# Patient Record
Sex: Female | Born: 1945 | Race: White | Hispanic: No | Smoking: Never smoker
Health system: Southern US, Community
[De-identification: ages and names within clinical notes are randomized; demographics above are authoritative.]

## PROBLEM LIST (undated history)

## (undated) DIAGNOSIS — G629 Polyneuropathy, unspecified: Secondary | ICD-10-CM

## (undated) DIAGNOSIS — R011 Cardiac murmur, unspecified: Secondary | ICD-10-CM

## (undated) DIAGNOSIS — E11610 Type 2 diabetes mellitus with diabetic neuropathic arthropathy: Secondary | ICD-10-CM

## (undated) DIAGNOSIS — G709 Myoneural disorder, unspecified: Secondary | ICD-10-CM

## (undated) DIAGNOSIS — L689 Hypertrichosis, unspecified: Secondary | ICD-10-CM

## (undated) DIAGNOSIS — D649 Anemia, unspecified: Secondary | ICD-10-CM

## (undated) DIAGNOSIS — E785 Hyperlipidemia, unspecified: Secondary | ICD-10-CM

## (undated) DIAGNOSIS — F419 Anxiety disorder, unspecified: Secondary | ICD-10-CM

## (undated) DIAGNOSIS — E119 Type 2 diabetes mellitus without complications: Secondary | ICD-10-CM

## (undated) DIAGNOSIS — Z87442 Personal history of urinary calculi: Secondary | ICD-10-CM

## (undated) DIAGNOSIS — I10 Essential (primary) hypertension: Secondary | ICD-10-CM

## (undated) DIAGNOSIS — Z780 Asymptomatic menopausal state: Principal | ICD-10-CM

## (undated) DIAGNOSIS — M1990 Unspecified osteoarthritis, unspecified site: Secondary | ICD-10-CM

## (undated) HISTORY — PX: CARPAL TUNNEL RELEASE: SHX101

## (undated) HISTORY — DX: Hypertrichosis, unspecified: L68.9

## (undated) HISTORY — DX: Type 2 diabetes mellitus with diabetic neuropathic arthropathy: E11.610

## (undated) HISTORY — PX: APPENDECTOMY: SHX54

## (undated) HISTORY — DX: Asymptomatic menopausal state: Z78.0

---

## 1999-04-09 HISTORY — PX: CARPAL TUNNEL RELEASE: SHX101

## 2000-12-31 ENCOUNTER — Ambulatory Visit (HOSPITAL_COMMUNITY): Admission: RE | Admit: 2000-12-31 | Discharge: 2000-12-31 | Payer: Self-pay | Admitting: Family Medicine

## 2000-12-31 ENCOUNTER — Encounter: Payer: Self-pay | Admitting: Family Medicine

## 2001-06-23 ENCOUNTER — Encounter: Payer: Self-pay | Admitting: *Deleted

## 2001-06-23 ENCOUNTER — Emergency Department (HOSPITAL_COMMUNITY): Admission: EM | Admit: 2001-06-23 | Discharge: 2001-06-23 | Payer: Self-pay | Admitting: *Deleted

## 2001-12-31 ENCOUNTER — Ambulatory Visit (HOSPITAL_COMMUNITY): Admission: RE | Admit: 2001-12-31 | Discharge: 2001-12-31 | Payer: Self-pay | Admitting: Family Medicine

## 2001-12-31 ENCOUNTER — Encounter: Payer: Self-pay | Admitting: Family Medicine

## 2003-01-05 ENCOUNTER — Encounter: Payer: Self-pay | Admitting: Family Medicine

## 2003-01-05 ENCOUNTER — Ambulatory Visit (HOSPITAL_COMMUNITY): Admission: RE | Admit: 2003-01-05 | Discharge: 2003-01-05 | Payer: Self-pay | Admitting: Family Medicine

## 2004-01-05 ENCOUNTER — Ambulatory Visit (HOSPITAL_COMMUNITY): Admission: RE | Admit: 2004-01-05 | Discharge: 2004-01-05 | Payer: Self-pay | Admitting: Family Medicine

## 2005-01-07 ENCOUNTER — Ambulatory Visit (HOSPITAL_COMMUNITY): Admission: RE | Admit: 2005-01-07 | Discharge: 2005-01-07 | Payer: Self-pay | Admitting: Family Medicine

## 2005-01-21 ENCOUNTER — Ambulatory Visit: Payer: Self-pay | Admitting: Internal Medicine

## 2005-01-21 ENCOUNTER — Ambulatory Visit (HOSPITAL_COMMUNITY): Admission: RE | Admit: 2005-01-21 | Discharge: 2005-01-21 | Payer: Self-pay | Admitting: Internal Medicine

## 2006-01-09 ENCOUNTER — Ambulatory Visit (HOSPITAL_COMMUNITY): Admission: RE | Admit: 2006-01-09 | Discharge: 2006-01-09 | Payer: Self-pay | Admitting: Family Medicine

## 2007-01-12 ENCOUNTER — Ambulatory Visit (HOSPITAL_COMMUNITY): Admission: RE | Admit: 2007-01-12 | Discharge: 2007-01-12 | Payer: Self-pay | Admitting: Family Medicine

## 2007-01-21 ENCOUNTER — Ambulatory Visit (HOSPITAL_COMMUNITY): Admission: RE | Admit: 2007-01-21 | Discharge: 2007-01-21 | Payer: Self-pay | Admitting: Family Medicine

## 2007-10-24 ENCOUNTER — Emergency Department (HOSPITAL_COMMUNITY): Admission: EM | Admit: 2007-10-24 | Discharge: 2007-10-24 | Payer: Self-pay | Admitting: Family Medicine

## 2008-01-14 ENCOUNTER — Ambulatory Visit (HOSPITAL_COMMUNITY): Admission: RE | Admit: 2008-01-14 | Discharge: 2008-01-14 | Payer: Self-pay | Admitting: Family Medicine

## 2008-01-27 ENCOUNTER — Ambulatory Visit: Payer: Self-pay | Admitting: Cardiology

## 2008-01-27 ENCOUNTER — Ambulatory Visit (HOSPITAL_COMMUNITY): Admission: RE | Admit: 2008-01-27 | Discharge: 2008-01-27 | Payer: Self-pay | Admitting: Endocrinology

## 2008-01-27 ENCOUNTER — Encounter (INDEPENDENT_AMBULATORY_CARE_PROVIDER_SITE_OTHER): Payer: Self-pay | Admitting: Endocrinology

## 2008-09-12 ENCOUNTER — Emergency Department (HOSPITAL_COMMUNITY): Admission: EM | Admit: 2008-09-12 | Discharge: 2008-09-12 | Payer: Self-pay | Admitting: Family Medicine

## 2008-11-28 ENCOUNTER — Ambulatory Visit (HOSPITAL_COMMUNITY): Admission: RE | Admit: 2008-11-28 | Discharge: 2008-11-28 | Payer: Self-pay | Admitting: Family Medicine

## 2009-01-18 ENCOUNTER — Ambulatory Visit (HOSPITAL_COMMUNITY): Admission: RE | Admit: 2009-01-18 | Discharge: 2009-01-18 | Payer: Self-pay | Admitting: *Deleted

## 2010-01-19 ENCOUNTER — Ambulatory Visit (HOSPITAL_COMMUNITY): Admission: RE | Admit: 2010-01-19 | Discharge: 2010-01-19 | Payer: Self-pay | Admitting: Family Medicine

## 2010-04-29 ENCOUNTER — Encounter: Payer: Self-pay | Admitting: Family Medicine

## 2010-04-30 ENCOUNTER — Encounter: Payer: Self-pay | Admitting: Family Medicine

## 2010-08-24 NOTE — Op Note (Signed)
NAMEHUSNA, Stacy Webb               ACCOUNT NO.:  [id number]   MEDICAL RECORD NO.:  [id number]          PATIENT TYPE:  AMB   LOCATION:  DAY                           FACILITY:  [facility]   PHYSICIAN:  Lionel December, M.D.    DATE OF BIRTH:  03/27/1946   DATE OF PROCEDURE:  01/21/2005  DATE OF DISCHARGE:                                 OPERATIVE REPORT   PROCEDURE:  Colonoscopy.   INDICATION:  The patient is a 65 year old Caucasian female who is here for  screening colonoscopy. Family history is significant for colon carcinoma in  a second degree relative.   Procedure risks were reviewed with the patient, and informed consent was  obtained.   PREMEDICATION:  Demerol 50 mg IV, Versed 7 mg IV in divided dose.   FINDINGS:  Procedure performed in the endoscopy suite. The patient's vital  signs and O2 saturation were monitored during the procedure and remained  stable. The patient was placed in left lateral position. Rectal examination  performed. No abnormality noted on external or digital exam. Olympus  videoscope was placed in the rectum and advanced under vision into sigmoid  colon and beyond. Preparation was excellent. Scattered diverticula were  noted at sigmoid and descending colon. Somewhat redundant sigmoid colon.  Loop had to be used in order to advance the scope. Scope was advanced into  cecum which was identified by appendiceal stump and ileocecal valve.  Pictures taken for the record. As the scope was withdrawn, colonic mucosa  was carefully examined and was normal throughout. Rectal mucosa was normal.  Scope was retroflexed to examine anorectal junction, and moderate-sized  hemorrhoids were noted below the dentate line. Endoscope was straightened  and withdrawn. The patient tolerated the procedure well.   FINAL DIAGNOSIS:  1.  Few scattered diverticula at sigmoid and descending colon.  2.  External hemorrhoids.   RECOMMENDATIONS:  1.  High-fiber diet, plus chew 2 tablets of  FiberChoice daily.  2.  Yearly Hemoccults.  3.  We may consider next screening exam in 10 years from now.      Lionel December, M.D.  Electronically Signed     NR/MEDQ  D:  01/21/2005  T:  01/21/2005  Job:  [id number]   cc:   Mila Homer. Sudie Bailey, M.D.  Fax: [phone]

## 2010-12-10 ENCOUNTER — Inpatient Hospital Stay (INDEPENDENT_AMBULATORY_CARE_PROVIDER_SITE_OTHER)
Admission: RE | Admit: 2010-12-10 | Discharge: 2010-12-10 | Disposition: A | Discharge status: Home / Self Care | Payer: 59 | Source: Ambulatory Visit | Attending: Family Medicine | Admitting: Family Medicine

## 2010-12-10 ENCOUNTER — Ambulatory Visit (INDEPENDENT_AMBULATORY_CARE_PROVIDER_SITE_OTHER): Payer: 59

## 2010-12-10 DIAGNOSIS — R1310 Dysphagia, unspecified: Secondary | ICD-10-CM

## 2010-12-31 ENCOUNTER — Other Ambulatory Visit (HOSPITAL_COMMUNITY): Payer: Self-pay | Admitting: Family Medicine

## 2010-12-31 DIAGNOSIS — Z139 Encounter for screening, unspecified: Secondary | ICD-10-CM

## 2011-01-21 ENCOUNTER — Ambulatory Visit (HOSPITAL_COMMUNITY)
Admission: RE | Admit: 2011-01-21 | Discharge: 2011-01-21 | Disposition: A | Discharge status: Home / Self Care | Payer: 59 | Source: Ambulatory Visit | Attending: Family Medicine | Admitting: Family Medicine

## 2011-01-21 DIAGNOSIS — Z1231 Encounter for screening mammogram for malignant neoplasm of breast: Secondary | ICD-10-CM | POA: Insufficient documentation

## 2011-01-21 DIAGNOSIS — Z139 Encounter for screening, unspecified: Secondary | ICD-10-CM

## 2011-01-30 ENCOUNTER — Other Ambulatory Visit: Payer: Self-pay | Admitting: Family Medicine

## 2011-01-30 DIAGNOSIS — R928 Other abnormal and inconclusive findings on diagnostic imaging of breast: Secondary | ICD-10-CM

## 2011-02-27 ENCOUNTER — Ambulatory Visit (HOSPITAL_COMMUNITY): Payer: 59

## 2011-03-13 ENCOUNTER — Ambulatory Visit (HOSPITAL_COMMUNITY)
Admission: RE | Admit: 2011-03-13 | Discharge: 2011-03-13 | Disposition: A | Discharge status: Home / Self Care | Payer: 59 | Source: Ambulatory Visit | Attending: Family Medicine | Admitting: Family Medicine

## 2011-03-13 DIAGNOSIS — R928 Other abnormal and inconclusive findings on diagnostic imaging of breast: Secondary | ICD-10-CM | POA: Insufficient documentation

## 2011-12-12 ENCOUNTER — Other Ambulatory Visit: Payer: Self-pay | Admitting: Adult Health

## 2011-12-12 DIAGNOSIS — N63 Unspecified lump in unspecified breast: Secondary | ICD-10-CM

## 2011-12-18 ENCOUNTER — Ambulatory Visit (HOSPITAL_COMMUNITY)
Admission: RE | Admit: 2011-12-18 | Discharge: 2011-12-18 | Disposition: A | Discharge status: Home / Self Care | Payer: Medicare Other | Source: Ambulatory Visit | Attending: Adult Health | Admitting: Adult Health

## 2011-12-18 DIAGNOSIS — N63 Unspecified lump in unspecified breast: Secondary | ICD-10-CM

## 2012-03-10 ENCOUNTER — Other Ambulatory Visit (HOSPITAL_COMMUNITY): Payer: Self-pay | Admitting: Family Medicine

## 2012-03-10 DIAGNOSIS — Z139 Encounter for screening, unspecified: Secondary | ICD-10-CM

## 2012-03-17 ENCOUNTER — Ambulatory Visit (HOSPITAL_COMMUNITY): Payer: PRIVATE HEALTH INSURANCE

## 2012-04-15 ENCOUNTER — Emergency Department (HOSPITAL_COMMUNITY)
Admission: EM | Admit: 2012-04-15 | Discharge: 2012-04-16 | Disposition: A | Discharge status: Home / Self Care | Payer: Medicare Other | Attending: Emergency Medicine | Admitting: Emergency Medicine

## 2012-04-15 ENCOUNTER — Encounter (HOSPITAL_COMMUNITY): Payer: Self-pay

## 2012-04-15 DIAGNOSIS — D649 Anemia, unspecified: Secondary | ICD-10-CM | POA: Insufficient documentation

## 2012-04-15 DIAGNOSIS — Z79899 Other long term (current) drug therapy: Secondary | ICD-10-CM | POA: Insufficient documentation

## 2012-04-15 DIAGNOSIS — R6884 Jaw pain: Secondary | ICD-10-CM

## 2012-04-15 DIAGNOSIS — Z7982 Long term (current) use of aspirin: Secondary | ICD-10-CM | POA: Insufficient documentation

## 2012-04-15 DIAGNOSIS — E119 Type 2 diabetes mellitus without complications: Secondary | ICD-10-CM | POA: Insufficient documentation

## 2012-04-15 DIAGNOSIS — Z791 Long term (current) use of non-steroidal anti-inflammatories (NSAID): Secondary | ICD-10-CM | POA: Insufficient documentation

## 2012-04-15 DIAGNOSIS — I10 Essential (primary) hypertension: Secondary | ICD-10-CM | POA: Insufficient documentation

## 2012-04-15 DIAGNOSIS — E785 Hyperlipidemia, unspecified: Secondary | ICD-10-CM | POA: Insufficient documentation

## 2012-04-15 HISTORY — DX: Type 2 diabetes mellitus without complications: E11.9

## 2012-04-15 HISTORY — DX: Hyperlipidemia, unspecified: E78.5

## 2012-04-15 HISTORY — DX: Essential (primary) hypertension: I10

## 2012-04-15 NOTE — ED Notes (Signed)
Pt had 20 minute episode of left jaw pain, no radiation of pain, no chest pain.  Pt states pain is gone now, but can "feel where it was"

## 2012-04-16 ENCOUNTER — Emergency Department (HOSPITAL_COMMUNITY): Payer: Medicare Other

## 2012-04-16 LAB — BASIC METABOLIC PANEL
BUN: 29 mg/dL — ABNORMAL HIGH (ref 6–23)
Calcium: 9.7 mg/dL (ref 8.4–10.5)
Chloride: 100 mEq/L (ref 96–112)
GFR calc Af Amer: 62 mL/min — ABNORMAL LOW (ref >90)
Glucose, Bld: 108 mg/dL — ABNORMAL HIGH (ref 70–99)
Potassium: 3.8 mEq/L (ref 3.5–5.1)
Sodium: 137 mEq/L (ref 135–145)

## 2012-04-16 LAB — CBC
MCH: 27.6 pg (ref 26.0–34.0)
MCHC: 33.9 g/dL (ref 30.0–36.0)
RDW: 14.2 % (ref 11.5–15.5)
WBC: 9 10*3/uL (ref 4.0–10.5)

## 2012-04-16 NOTE — ED Notes (Addendum)
Pt alert & oriented x4, stable gait. Patient  given discharge instructions, paperwork & prescription(s). Patient verbalized understanding. Pt left department w/ no further questions. 

## 2012-04-16 NOTE — ED Provider Notes (Signed)
History     CSN: [id number]  Arrival date & time 04/15/12  2305   First MD Initiated Contact with Patient 04/15/12 2324      Chief Complaint  Patient presents with  . Jaw Pain    (Consider location/radiation/quality/duration/timing/severity/associated sxs/prior treatment) HPI Stacy Webb is a 67 y.o. female who presents to the Emergency Department complaining of left jaw pain that began tonight while sitting at home and lasted about 20 minutes. It was worse with opening and closing her mouth. It did not radiate. It was not associated with chest pain, nausea, or shortness of breath.She denies fever, chills, cough, runny nose.  PCP Dr. Sudie Bailey Past Medical History  Diagnosis Date  . Diabetes mellitus without complication   . Hypertension   . Hyperlipidemia     Past Surgical History  Procedure Date  . Appendectomy   . Cesarean section   . Carpal tunnel release     No family history on file.  History  Substance Use Topics  . Smoking status: Never Smoker   . Smokeless tobacco: Not on file  . Alcohol Use: No    OB History    Grav Para Term Preterm Abortions TAB SAB Ect Mult Living                  Review of Systems  Constitutional: Negative for fever.       10 Systems reviewed and are negative for acute change except as noted in the HPI.  HENT: Negative for congestion.        Left jaw pain  Eyes: Negative for discharge and redness.  Respiratory: Negative for cough and shortness of breath.   Cardiovascular: Negative for chest pain.  Gastrointestinal: Negative for vomiting and abdominal pain.  Musculoskeletal: Negative for back pain.  Skin: Negative for rash.  Neurological: Negative for syncope, numbness and headaches.  Psychiatric/Behavioral:       No behavior change.    Allergies  Enalapril  Home Medications   Current Outpatient Rx  Name  Route  Sig  Dispense  Refill  . ASPIRIN 81 MG PO TABS   Oral   Take 81 mg by mouth daily.         Marland Kitchen  DILTIAZEM HCL ER 120 MG PO CP12   Oral   Take 120 mg by mouth 2 (two) times daily.         Marland Kitchen GABAPENTIN 100 MG PO CAPS   Oral   Take 100 mg by mouth 2 times daily at 12 noon and 4 pm.         . HYDROCHLOROTHIAZIDE 25 MG PO TABS   Oral   Take 25 mg by mouth daily.         Marland Kitchen METFORMIN HCL 1000 MG PO TABS   Oral   Take 1,000 mg by mouth 2 (two) times daily with a meal.         . NAPROXEN 250 MG PO TABS   Oral   Take 250 mg by mouth daily at 2 PM daily at 2 PM.         . PRAVASTATIN SODIUM 20 MG PO TABS   Oral   Take 20 mg by mouth 2 (two) times daily.           BP 136/99  Temp 97.7 F (36.5 C) (Oral)  Resp 16  Ht 5\' 6"  (1.676 m)  Wt 140 lb (63.504 kg)  BMI 22.60 kg/m2  SpO2 98%  Physical Exam  Nursing note and vitals reviewed. Constitutional: She is oriented to person, place, and time.       Awake, alert, nontoxic appearance.  HENT:  Head: Atraumatic.       Good dentition, no oral lesions. No pain with opening and closing mouth. Mild jaw clicking on the left side.  Eyes: Right eye exhibits no discharge. Left eye exhibits no discharge.  Neck: Neck supple.  Cardiovascular: Normal heart sounds.   Pulmonary/Chest: Effort normal and breath sounds normal. She exhibits no tenderness.  Abdominal: Soft. Bowel sounds are normal. There is no tenderness. There is no rebound.  Musculoskeletal: Normal range of motion. She exhibits no tenderness.       Baseline ROM, no obvious new focal weakness.  Neurological: She is alert and oriented to person, place, and time.       Mental status and motor strength appears baseline for patient and situation.  Skin: No rash noted.  Psychiatric: She has a normal mood and affect.    ED Course  Procedures (including critical care time)   Date: 04/16/2012  2317  Rate: 68  Rhythm: normal sinus rhythm  QRS Axis: normal  Intervals: normal  ST/T Wave abnormalities: normal  Conduction Disutrbances: none  Narrative Interpretation:  unremarkable  Results for orders placed during the hospital encounter of 04/15/12  CBC      Component Value Range   WBC 9.0  4.0 - 10.5 K/uL   RBC 3.95  3.87 - 5.11 MIL/uL   Hemoglobin 10.9 (*) 12.0 - 15.0 g/dL   HCT 86.5 (*) 78.4 - 69.6 %   MCV 81.5  78.0 - 100.0 fL   MCH 27.6  26.0 - 34.0 pg   MCHC 33.9  30.0 - 36.0 g/dL   RDW 29.5  28.4 - 13.2 %   Platelets 276  150 - 400 K/uL  BASIC METABOLIC PANEL      Component Value Range   Sodium 137  135 - 145 mEq/L   Potassium 3.8  3.5 - 5.1 mEq/L   Chloride 100  96 - 112 mEq/L   CO2 28  19 - 32 mEq/L   Glucose, Bld 108 (*) 70 - 99 mg/dL   BUN 29 (*) 6 - 23 mg/dL   Creatinine, Ser 4.40  0.50 - 1.10 mg/dL   Calcium 9.7  8.4 - 10.2 mg/dL   GFR calc non Af Amer 54 (*) >90 mL/min   GFR calc Af Amer 62 (*) >90 mL/min  TROPONIN I      Component Value Range   Troponin I <0.30  <0.30 ng/mL      MDM  Patient presentsw with jaw pain that lasted 20 minutes and was not associated with nausea, shortness of breath or chest pain.It had resolved prior to arrival in the ER.  EKG, chest xray and troponin negative. Labs show anemia. Reviewed results with patient. Pt stable in ED with no significant deterioration in condition.The patient appears reasonably screened and/or stabilized for discharge and I doubt any other medical condition or other [facility] requiring further screening, evaluation, or treatment in the ED at this time prior to discharge.  MDM Reviewed: nursing note and vitals Interpretation: labs, ECG and x-ray           Nicoletta Dress. Colon Branch, MD 04/16/12 7253

## 2012-08-11 ENCOUNTER — Encounter (INDEPENDENT_AMBULATORY_CARE_PROVIDER_SITE_OTHER): Payer: Self-pay | Admitting: Internal Medicine

## 2012-08-11 ENCOUNTER — Encounter (HOSPITAL_COMMUNITY): Payer: Self-pay | Admitting: Pharmacy Technician

## 2012-08-11 ENCOUNTER — Ambulatory Visit (INDEPENDENT_AMBULATORY_CARE_PROVIDER_SITE_OTHER): Payer: Medicare Other | Admitting: Internal Medicine

## 2012-08-11 ENCOUNTER — Telehealth (INDEPENDENT_AMBULATORY_CARE_PROVIDER_SITE_OTHER): Payer: Self-pay | Admitting: *Deleted

## 2012-08-11 ENCOUNTER — Other Ambulatory Visit (INDEPENDENT_AMBULATORY_CARE_PROVIDER_SITE_OTHER): Payer: Self-pay | Admitting: *Deleted

## 2012-08-11 VITALS — BP 168/68 | HR 68 | Ht 65.0 in | Wt 145.6 lb

## 2012-08-11 DIAGNOSIS — I10 Essential (primary) hypertension: Secondary | ICD-10-CM | POA: Insufficient documentation

## 2012-08-11 DIAGNOSIS — E7800 Pure hypercholesterolemia, unspecified: Secondary | ICD-10-CM | POA: Insufficient documentation

## 2012-08-11 DIAGNOSIS — R195 Other fecal abnormalities: Secondary | ICD-10-CM

## 2012-08-11 DIAGNOSIS — Z1211 Encounter for screening for malignant neoplasm of colon: Secondary | ICD-10-CM

## 2012-08-11 DIAGNOSIS — Z800 Family history of malignant neoplasm of digestive organs: Secondary | ICD-10-CM

## 2012-08-11 DIAGNOSIS — E119 Type 2 diabetes mellitus without complications: Secondary | ICD-10-CM | POA: Insufficient documentation

## 2012-08-11 MED ORDER — PEG-KCL-NACL-NASULF-NA ASC-C 100 G PO SOLR: 1.0000 | Freq: Once | ORAL | Status: DC

## 2012-08-11 NOTE — Progress Notes (Signed)
Subjective:     Patient ID: Stacy Webb, female   DOB: [birth year], 67 y.o.   MRN: [id number]  HPI Referred to our office by Dr. Sudie Bailey for anemia/Colonoscopy/EGD.  She tells me she turned in 3 stool cards and one stool card was positive. Appetite is good. She has weight loss which wasintentional. No acid reflux. No abdominal pain. Usually has a BM daily. No melena or bright red rectal bleeding.  Sometimes she has trouble swallowing rice.  No trouble with meats.  Family hx of colon cancer (Father's brother age 56 when diagnosed)  06/29/2012 H and H 12.3 and 37.8, MCV 83.1 Iron 67,, % saturation 13L, Ferritin 21, Vitamin B12 1356, Folate greater than 24.   Colonoscopy in 2006 Dr. Karilyn Cota:  FINAL DIAGNOSIS:  1. Few scattered diverticula at sigmoid and descending colon.  2. External hemorrhoids.   CBC    Component Value Date/Time   WBC 9.0 04/16/2012 0030   RBC 3.95 04/16/2012 0030   HGB 10.9* 04/16/2012 0030   HCT 32.2* 04/16/2012 0030   PLT 276 04/16/2012 0030   MCV 81.5 04/16/2012 0030   MCH 27.6 04/16/2012 0030   MCHC 33.9 04/16/2012 0030   RDW 14.2 04/16/2012 0030     Review of Systems see hpio Current Outpatient Prescriptions  Medication Sig Dispense Refill  . aspirin 81 MG tablet Take 81 mg by mouth daily.      Marland Kitchen diltiazem (CARDIZEM SR) 120 MG 12 hr capsule Take 120 mg by mouth 2 (two) times daily.      Marland Kitchen gabapentin (NEURONTIN) 100 MG capsule Take 300 mg by mouth 2 times daily at 12 noon and 4 pm.       . hydrochlorothiazide (HYDRODIURIL) 25 MG tablet Take 25 mg by mouth daily.      . metFORMIN (GLUCOPHAGE) 1000 MG tablet Take 1,000 mg by mouth 2 (two) times daily with a meal.      . naproxen (NAPROSYN) 250 MG tablet Take 250 mg by mouth daily at 2 PM daily at 2 PM.      . pravastatin (PRAVACHOL) 20 MG tablet Take 20 mg by mouth 2 (two) times daily.      . vitamin B-12 (CYANOCOBALAMIN) 1000 MCG tablet Take 1,000 mcg by mouth once a week.       No current facility-administered  medications for this visit.   Past Medical History  Diagnosis Date  . Diabetes mellitus without complication   . Hypertension   . Hyperlipidemia    Past Surgical History  Procedure Laterality Date  . Appendectomy    . Cesarean section    . Carpal tunnel release     Allergies  Allergen Reactions  . Enalapril   '      Objective:   Physical Exam  Filed Vitals:   08/11/12 0938  BP: 168/68  Pulse: 68  Height: 5\' 5"  (1.651 m)  Weight: 145 lb 9.6 oz (66.044 kg)  Alert and oriented. Skin warm and dry. Oral mucosa is moist.   . Sclera anicteric, conjunctivae is pink. Thyroid not enlarged. No cervical lymphadenopathy. Lungs clear. Heart regular rate and rhythm.  Abdomen is soft. Bowel sounds are positive. No hepatomegaly. No abdominal masses felt. No tenderness.  No edema to lower extremities. Stool brown and guaiac negative.      Assessment:    Heme positive stool. Family hx of colon cancer. Colonic neoplasm needs to be ruled out.     Plan:    Colonoscopy with Dr.  Rehman.

## 2012-08-11 NOTE — Telephone Encounter (Signed)
Patient needs movi prep 

## 2012-08-11 NOTE — Patient Instructions (Addendum)
Colonoscopy with Dr. Rehman 

## 2012-08-12 ENCOUNTER — Encounter (INDEPENDENT_AMBULATORY_CARE_PROVIDER_SITE_OTHER): Payer: Self-pay

## 2012-08-28 ENCOUNTER — Other Ambulatory Visit (INDEPENDENT_AMBULATORY_CARE_PROVIDER_SITE_OTHER): Payer: Self-pay | Admitting: Internal Medicine

## 2012-08-28 ENCOUNTER — Encounter (HOSPITAL_COMMUNITY)
Admission: RE | Disposition: A | Discharge status: Home / Self Care | Payer: Self-pay | Source: Ambulatory Visit | Attending: Internal Medicine

## 2012-08-28 ENCOUNTER — Ambulatory Visit (HOSPITAL_COMMUNITY)
Admission: RE | Admit: 2012-08-28 | Discharge: 2012-08-28 | Disposition: A | Discharge status: Home / Self Care | Payer: Medicare Other | Source: Ambulatory Visit | Attending: Internal Medicine | Admitting: Internal Medicine

## 2012-08-28 ENCOUNTER — Encounter (HOSPITAL_COMMUNITY): Payer: Self-pay | Admitting: *Deleted

## 2012-08-28 DIAGNOSIS — Z800 Family history of malignant neoplasm of digestive organs: Secondary | ICD-10-CM

## 2012-08-28 DIAGNOSIS — Z79899 Other long term (current) drug therapy: Secondary | ICD-10-CM | POA: Insufficient documentation

## 2012-08-28 DIAGNOSIS — K5730 Diverticulosis of large intestine without perforation or abscess without bleeding: Secondary | ICD-10-CM | POA: Insufficient documentation

## 2012-08-28 DIAGNOSIS — F411 Generalized anxiety disorder: Secondary | ICD-10-CM | POA: Insufficient documentation

## 2012-08-28 DIAGNOSIS — Z7982 Long term (current) use of aspirin: Secondary | ICD-10-CM | POA: Insufficient documentation

## 2012-08-28 DIAGNOSIS — M19049 Primary osteoarthritis, unspecified hand: Secondary | ICD-10-CM | POA: Insufficient documentation

## 2012-08-28 DIAGNOSIS — I10 Essential (primary) hypertension: Secondary | ICD-10-CM | POA: Insufficient documentation

## 2012-08-28 DIAGNOSIS — Z880 Allergy status to penicillin: Secondary | ICD-10-CM | POA: Insufficient documentation

## 2012-08-28 DIAGNOSIS — K644 Residual hemorrhoidal skin tags: Secondary | ICD-10-CM

## 2012-08-28 DIAGNOSIS — R195 Other fecal abnormalities: Secondary | ICD-10-CM | POA: Insufficient documentation

## 2012-08-28 DIAGNOSIS — Z791 Long term (current) use of non-steroidal anti-inflammatories (NSAID): Secondary | ICD-10-CM | POA: Insufficient documentation

## 2012-08-28 DIAGNOSIS — Q438 Other specified congenital malformations of intestine: Secondary | ICD-10-CM

## 2012-08-28 DIAGNOSIS — K921 Melena: Secondary | ICD-10-CM

## 2012-08-28 DIAGNOSIS — E785 Hyperlipidemia, unspecified: Secondary | ICD-10-CM | POA: Insufficient documentation

## 2012-08-28 DIAGNOSIS — E1142 Type 2 diabetes mellitus with diabetic polyneuropathy: Secondary | ICD-10-CM | POA: Insufficient documentation

## 2012-08-28 DIAGNOSIS — E1149 Type 2 diabetes mellitus with other diabetic neurological complication: Secondary | ICD-10-CM | POA: Insufficient documentation

## 2012-08-28 DIAGNOSIS — Z538 Procedure and treatment not carried out for other reasons: Secondary | ICD-10-CM

## 2012-08-28 HISTORY — DX: Unspecified osteoarthritis, unspecified site: M19.90

## 2012-08-28 HISTORY — PX: COLONOSCOPY: SHX5424

## 2012-08-28 HISTORY — DX: Anxiety disorder, unspecified: F41.9

## 2012-08-28 HISTORY — DX: Myoneural disorder, unspecified: G70.9

## 2012-08-28 SURGERY — COLONOSCOPY
Anesthesia: Moderate Sedation

## 2012-08-28 MED ORDER — MEPERIDINE HCL 50 MG/ML IJ SOLN
INTRAMUSCULAR | Status: AC
  Filled 2012-08-28: qty 1

## 2012-08-28 MED ORDER — MIDAZOLAM HCL 5 MG/5ML IJ SOLN
INTRAMUSCULAR | Status: DC | PRN
  Administered 2012-08-28: 1 mg via INTRAVENOUS
  Administered 2012-08-28: 2 mg via INTRAVENOUS
  Administered 2012-08-28: 1 mg via INTRAVENOUS
  Administered 2012-08-28: 2 mg via INTRAVENOUS

## 2012-08-28 MED ORDER — SODIUM CHLORIDE 0.9 % IV SOLN
INTRAVENOUS | Status: DC
  Administered 2012-08-28: 09:00:00 via INTRAVENOUS

## 2012-08-28 MED ORDER — MIDAZOLAM HCL 5 MG/5ML IJ SOLN
INTRAMUSCULAR | Status: AC
  Filled 2012-08-28: qty 10

## 2012-08-28 MED ORDER — STERILE WATER FOR IRRIGATION IR SOLN
Status: DC | PRN
  Administered 2012-08-28: 09:00:00

## 2012-08-28 MED ORDER — MEPERIDINE HCL 50 MG/ML IJ SOLN
INTRAMUSCULAR | Status: DC | PRN
  Administered 2012-08-28 (×2): 25 mg via INTRAVENOUS

## 2012-08-28 NOTE — H&P (Addendum)
Stacy Webb is an 67 y.o. female.   Chief Complaint: Patient's here for colonoscopy. HPI: Patient 67 year old Caucasian female who was recently found to have heme positive stool. She denies abdominal pain melena rectal bleeding. She also denies constipation. She is on low-dose aspirin she also takes naproxen on when necessary basis. Last colonoscopy was in October 2006 revealing left-sided colonic diverticulosis and external hemorrhoids. Family history significant for colon carcinoma and paternal uncle who was diagnosed in his 57s and died of unrelated causes.  Active Ambulatory Problems    Diagnosis Date Noted  . Essential hypertension, benign 08/11/2012  . High cholesterol 08/11/2012  . Family hx of colon cancer 08/11/2012  . Diabetes (HCC) 08/11/2012  . Postmenopausal 07/19/2015  . Diabetic foot ulcer (HCC) 09/27/2016  . CKD (chronic kidney disease), stage III (HCC) 09/27/2016  . 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 Hyperlipidemia 09/27/2016  . Hypertension 09/27/2016  . Diabetic ulcer of foot associated with diabetes mellitus due to underlying condition, limited to breakdown of skin (HCC) 09/27/2016  . Constipation 06/10/2017  . Hemorrhoids 06/10/2017  . Cystocele with rectocele 06/10/2017  . Rectocele 06/10/2017  . Absolute anemia 10/30/2017   Resolved Ambulatory Problems    Diagnosis Date Noted  . No Resolved Ambulatory Problems   Past Medical History:  Diagnosis Date  . Anemia   . Anxiety   . Arthritis   . Diabetes mellitus without complication (HCC)   . Excessive hair growth 07/19/2015  . Heart murmur   . History of kidney stones   . Neuromuscular disorder (HCC)   . Neuropathy     Past Surgical History  Procedure Laterality Date  . Appendectomy    . Cesarean section    . Carpal tunnel release      History reviewed. No pertinent family history. Social History:  reports that she has never smoked. She does not have any smokeless tobacco  history on file. She reports that she does not drink alcohol or use illicit drugs.  Allergies:  Allergies  Allergen Reactions  . Enalapril     Medications Prior to Admission  Medication Sig Dispense Refill  . aspirin 81 MG tablet Take 81 mg by mouth daily.      Marland Kitchen diltiazem (CARDIZEM SR) 120 MG 12 hr capsule Take 120 mg by mouth 2 (two) times daily.      Marland Kitchen gabapentin (NEURONTIN) 100 MG capsule Take 300 mg by mouth 2 times daily at 12 noon and 4 pm.       . hydrochlorothiazide (HYDRODIURIL) 25 MG tablet Take 25 mg by mouth daily.      . metFORMIN (GLUCOPHAGE) 1000 MG tablet Take 1,000 mg by mouth 2 (two) times daily with a meal.      . naproxen (NAPROSYN) 250 MG tablet Take 250 mg by mouth daily at 2 PM daily at 2 PM.      . peg 3350 powder (MOVIPREP) 100 G SOLR Take 1 kit (100 g total) by mouth once.  1 kit  0  . pravastatin (PRAVACHOL) 20 MG tablet Take 20 mg by mouth 2 (two) times daily.      . vitamin B-12 (CYANOCOBALAMIN) 1000 MCG tablet Take 1,000 mcg by mouth once a week.        Results for orders placed during the hospital encounter of 08/28/12 (from the past 48 hour(s))  GLUCOSE, CAPILLARY     Status: Abnormal   Collection Time    08/28/12  8:58 AM  Result Value Range   Glucose-Capillary 112 (*) 70 - 99 mg/dL   No results found.  ROS  Blood pressure 170/75, pulse 58, temperature 97.6 F (36.4 C), temperature source Oral, resp. rate 16, SpO2 98.00%. Physical Exam  Constitutional: She appears well-developed and well-nourished.  HENT:  Mouth/Throat: Oropharynx is clear and moist.  Eyes: Conjunctivae are normal. No scleral icterus.  Neck: No thyromegaly present.  Cardiovascular: Normal rate, regular rhythm and normal heart sounds.   No murmur heard. Respiratory: Effort normal and breath sounds normal.  GI: She exhibits no distension and no mass. There is no tenderness.  Musculoskeletal: She exhibits no edema.  Lymphadenopathy:    She has no cervical adenopathy.   Neurological: She is alert.  Skin: Skin is warm and dry.     Assessment/Plan Heme positive stools. Diagnostic colonoscopy.  Janae Bonser U 08/28/2012, 9:14 AM

## 2012-08-28 NOTE — Op Note (Signed)
COLONOSCOPY PROCEDURE REPORT  PATIENT:  Stacy Webb  MR#:  [id number] Birthdate:  April 20, 1945, 67 y.o., female Endoscopist:  Dr. Malissa Hippo, MD Referred By:  Dr. Mila Homer. Sudie Bailey, MD  Procedure Date: 08/28/2012  Procedure:   Colonoscopy, incomplete hepatic flexure.  Indications:  Patient is 67 year old female who has heme-positive stools but no GI symptoms. She takes low-dose aspirin and naproxen 250 mg by mouth daily.  Informed Consent:  The procedure and risks were reviewed with the patient and informed consent was obtained.  Medications:  Demerol 50 mg IV Versed 6 mg IV  Description of procedure:  After a digital rectal exam was performed, that colonoscope was advanced from the anus through the rectum and colon to the area of hepatic flexure. Patient had unusual loop in the sigmoid colon which could never be reduced, therefore examination could not completed. As the scope was slowly and cautiously withdrawn. The mucosal surfaces were carefully surveyed utilizing scope tip to flexion to facilitate fold flattening as needed. The scope was pulled down into the rectum where a thorough exam including retroflexion was performed.  Findings:   Prep excellent. Multiple diverticula at sigmoid colon with few more descending and transverse colon. Patient formed unusual loop in the sigmoid colon which could not be reduced using pediatric order slim scope. No polyps or other mucosal abnormalities noted. Normal rectal mucosa. Rahman and hemorrhoids below the dentate line.    Therapeutic/Diagnostic Maneuvers Performed:   None  Complications:  None  Cecal Withdrawal Time:  NA  Impression:  Incomplete examination the hepatic flexure. Multiple diverticula at sigmoid colon and few more descending and transverse colon. Unusual loop in sigmoid colon prevented completion of exam.  Recommendations:  Standard instructions given. Barium enema to be scheduled in 2 weeks.  Jermiah Howton U   08/28/2012 10:14 AM  CC: Dr. Milana Obey, MD & Dr. Bonnetta Barry ref. provider found

## 2012-09-01 ENCOUNTER — Encounter (HOSPITAL_COMMUNITY): Payer: Self-pay | Admitting: Internal Medicine

## 2012-09-07 ENCOUNTER — Other Ambulatory Visit (HOSPITAL_COMMUNITY): Payer: Medicare Other

## 2012-09-11 ENCOUNTER — Ambulatory Visit (HOSPITAL_COMMUNITY)
Admit: 2012-09-11 | Discharge: 2012-09-11 | Disposition: A | Discharge status: Home / Self Care | Payer: Medicare Other | Source: Ambulatory Visit | Attending: Internal Medicine | Admitting: Internal Medicine

## 2012-09-11 DIAGNOSIS — K921 Melena: Secondary | ICD-10-CM | POA: Insufficient documentation

## 2012-09-11 DIAGNOSIS — K5730 Diverticulosis of large intestine without perforation or abscess without bleeding: Secondary | ICD-10-CM | POA: Insufficient documentation

## 2012-09-11 DIAGNOSIS — Z538 Procedure and treatment not carried out for other reasons: Secondary | ICD-10-CM

## 2012-09-11 DIAGNOSIS — Q438 Other specified congenital malformations of intestine: Secondary | ICD-10-CM

## 2012-09-11 DIAGNOSIS — K6389 Other specified diseases of intestine: Secondary | ICD-10-CM | POA: Insufficient documentation

## 2014-11-17 ENCOUNTER — Ambulatory Visit: Payer: Medicare Other | Admitting: Orthopedic Surgery

## 2014-11-24 ENCOUNTER — Encounter: Payer: Self-pay | Admitting: Orthopedic Surgery

## 2014-11-24 ENCOUNTER — Other Ambulatory Visit: Payer: Self-pay | Admitting: Orthopedic Surgery

## 2014-11-24 ENCOUNTER — Ambulatory Visit (HOSPITAL_COMMUNITY)
Admission: RE | Admit: 2014-11-24 | Discharge: 2014-11-24 | Disposition: A | Discharge status: Home / Self Care | Payer: Medicare Other | Source: Ambulatory Visit | Attending: Orthopedic Surgery | Admitting: Orthopedic Surgery

## 2014-11-24 ENCOUNTER — Ambulatory Visit (INDEPENDENT_AMBULATORY_CARE_PROVIDER_SITE_OTHER): Payer: Medicare Other | Admitting: Orthopedic Surgery

## 2014-11-24 VITALS — BP 168/96 | Ht 64.0 in | Wt 149.0 lb

## 2014-11-24 DIAGNOSIS — M47892 Other spondylosis, cervical region: Secondary | ICD-10-CM | POA: Insufficient documentation

## 2014-11-24 DIAGNOSIS — I6523 Occlusion and stenosis of bilateral carotid arteries: Secondary | ICD-10-CM | POA: Diagnosis not present

## 2014-11-24 DIAGNOSIS — M75101 Unspecified rotator cuff tear or rupture of right shoulder, not specified as traumatic: Secondary | ICD-10-CM

## 2014-11-24 DIAGNOSIS — M542 Cervicalgia: Secondary | ICD-10-CM

## 2014-11-24 DIAGNOSIS — M25511 Pain in right shoulder: Secondary | ICD-10-CM | POA: Insufficient documentation

## 2014-11-24 DIAGNOSIS — M4312 Spondylolisthesis, cervical region: Secondary | ICD-10-CM | POA: Diagnosis not present

## 2014-11-24 NOTE — Progress Notes (Signed)
Patient ID: Stacy Webb, female   DOB: Aug 28, 1945, 69 y.o.   MRN: [id number]  Chief Complaint  Patient presents with  . Shoulder Injury    right shoulder pain, DOI 06/2014    HPI Stacy Webb is a 69 y.o. female.  Presents with you for evaluation of right shoulder pain  The patient injured himself in March 2016 while she was blowing some leaves. She thinks she is told that are caught it while she was working after she slipped and fell. She complains of pain 7 out of 10 right shoulder with catching and aching. Denies previous treatment she did try some heat on the right shoulder no relief was obtained. System review numbness tingling burning pain in the feet gait problems rash and excessive nighttime urination and discharge from her eyes  Review of Systems Review of Systems   Past Medical History  Diagnosis Date  . Diabetes mellitus without complication   . Hypertension   . Hyperlipidemia   . Anxiety   . Mental disorder   . Neuromuscular disorder     diabetic neuropathy  . Arthritis     fingers    Past Surgical History  Procedure Laterality Date  . Appendectomy    . Cesarean section    . Carpal tunnel release    . Colonoscopy N/A 08/28/2012    Procedure: COLONOSCOPY;  Surgeon: Rogene Houston, MD;  Location: [facility];  Service: Endoscopy;  Laterality: N/A;  915    Social History Social History  Substance Use Topics  . Smoking status: Never Smoker   . Smokeless tobacco: None  . Alcohol Use: No    Allergies  Allergen Reactions  . Enalapril     Current Outpatient Prescriptions  Medication Sig Dispense Refill  . diltiazem (CARDIZEM SR) 120 MG 12 hr capsule Take 120 mg by mouth 2 (two) times daily.    . hydrochlorothiazide (HYDRODIURIL) 25 MG tablet Take 25 mg by mouth daily.    . metFORMIN (GLUCOPHAGE) 1000 MG tablet Take 1,000 mg by mouth 2 (two) times daily with a meal.    . naproxen (NAPROSYN) 250 MG tablet Take 250 mg by mouth daily at 2 PM daily at 2  PM.     No current facility-administered medications for this visit.      Physical Exam Physical Exam Blood pressure 168/96, height 5\' 4"  (1.626 m), weight 149 lb (67.586 kg).  Gen. appearance small frame normal appearance well-groomed The patient is alert and oriented person place and time Mood is normal affect is normal Ambulatory status  Ambulation was normal  Exam of the left shoulder normal  Inspection right shoulder mild tenderness in the rotator interval ROM normal passive range of motion slightly decreased active range of motion compared to the left shoulder Stability abduction external rotation is normal Strength bilateral shoulder rotator cuff weakness to manual muscle testing  Skin: Normal both shoulders  Pulses: Normal both hands and wrists  Neuro: Normal sensation bilateral   Data Reviewed C-spine and right shoulder x-ray shows degenerative cervical spondylosis and mild arthritis right shoulder shows a spur on the anterior acromion which appears to be related to the CA ligament  Assessment    Encounter Diagnosis  Name Primary?  . Rotator cuff syndrome of right shoulder Yes        Plan    The shoulder was injected and we gave her a home exercise program follow-up 2 months   Procedure note the subacromial injection shoulder RIGHT  Verbal consent was obtained to inject the  RIGHT   Shoulder  Timeout was completed to confirm the injection site is a subacromial space of the  RIGHT  shoulder   Medication used Depo-Medrol 40 mg and lidocaine 1% 3 cc  Anesthesia was provided by ethyl chloride  The injection was performed in the RIGHT  posterior subacromial space. After pinning the skin with alcohol and anesthetized the skin with ethyl chloride the subacromial space was injected using a 20-gauge needle. There were no complications  Sterile dressing was applied.          Arther Abbott 11/24/2014, 1:11 PM

## 2014-11-24 NOTE — Patient Instructions (Signed)
HOME EXERCISES    Joint Injection Care After Refer to this sheet in the next few days. These instructions provide you with information on caring for yourself after you have had a joint injection. Your caregiver also may give you more specific instructions. Your treatment has been planned according to current medical practices, but problems sometimes occur. Call your caregiver if you have any problems or questions after your procedure. After any type of joint injection, it is not uncommon to experience:  Soreness, swelling, or bruising around the injection site.  Mild numbness, tingling, or weakness around the injection site caused by the numbing medicine used before or with the injection. It also is possible to experience the following effects associated with the specific agent after injection:  Iodine-based contrast agents:  Allergic reaction (itching, hives, widespread redness, and swelling beyond the injection site).  Corticosteroids (These effects are rare.):  Allergic reaction.  Increased blood sugar levels (If you have diabetes and you notice that your blood sugar levels have increased, notify your caregiver).  Increased blood pressure levels.  Mood swings.  Hyaluronic acid in the use of viscosupplementation.  Temporary heat or redness.  Temporary rash and itching.  Increased fluid accumulation in the injected joint. These effects all should resolve within a day after your procedure.  HOME CARE INSTRUCTIONS  Limit yourself to light activity the day of your procedure. Avoid lifting heavy objects, bending, stooping, or twisting.  Take prescription or over-the-counter pain medication as directed by your caregiver.  You may apply ice to your injection site to reduce pain and swelling the day of your procedure. Ice may be applied 03-04 times:  Put ice in a plastic bag.  Place a towel between your skin and the bag.  Leave the ice on for no longer than 15-20 minutes  each time. SEEK IMMEDIATE MEDICAL CARE IF:   Pain and swelling get worse rather than better or extend beyond the injection site.  Numbness does not go away.  Blood or fluid continues to leak from the injection site.  You have chest pain.  You have swelling of your face or tongue.  You have trouble breathing or you become dizzy.  You develop a fever, chills, or severe tenderness at the injection site that last longer than 1 day. MAKE SURE YOU:  Understand these instructions.  Watch your condition.  Get help right away if you are not doing well or if you get worse. Document Released: 12/06/2010 Document Revised: 06/17/2011 Document Reviewed: 12/06/2010 [facility] Patient Information 2015 [place], [place]. This information is not intended to replace advice given to you by your health care provider. Make sure you discuss any questions you have with your health care provider.

## 2014-12-19 ENCOUNTER — Other Ambulatory Visit (HOSPITAL_COMMUNITY): Payer: Self-pay | Admitting: Family Medicine

## 2014-12-19 ENCOUNTER — Ambulatory Visit (HOSPITAL_COMMUNITY)
Admission: RE | Admit: 2014-12-19 | Discharge: 2014-12-19 | Disposition: A | Discharge status: Home / Self Care | Payer: Medicare Other | Source: Ambulatory Visit | Attending: Family Medicine | Admitting: Family Medicine

## 2014-12-19 DIAGNOSIS — M545 Low back pain, unspecified: Secondary | ICD-10-CM

## 2015-01-24 ENCOUNTER — Other Ambulatory Visit (HOSPITAL_COMMUNITY): Payer: Self-pay | Admitting: Family Medicine

## 2015-01-24 DIAGNOSIS — Z1231 Encounter for screening mammogram for malignant neoplasm of breast: Secondary | ICD-10-CM

## 2015-01-26 ENCOUNTER — Ambulatory Visit: Payer: Medicare Other | Admitting: Orthopedic Surgery

## 2015-02-02 ENCOUNTER — Ambulatory Visit (HOSPITAL_COMMUNITY): Payer: Medicare Other

## 2015-03-01 ENCOUNTER — Ambulatory Visit (HOSPITAL_COMMUNITY)
Admission: RE | Admit: 2015-03-01 | Discharge: 2015-03-01 | Disposition: A | Discharge status: Home / Self Care | Payer: Medicare Other | Source: Ambulatory Visit | Attending: Family Medicine | Admitting: Family Medicine

## 2015-03-01 DIAGNOSIS — Z1231 Encounter for screening mammogram for malignant neoplasm of breast: Secondary | ICD-10-CM

## 2015-03-14 ENCOUNTER — Encounter (INDEPENDENT_AMBULATORY_CARE_PROVIDER_SITE_OTHER): Payer: Self-pay | Admitting: Internal Medicine

## 2015-03-14 ENCOUNTER — Ambulatory Visit (INDEPENDENT_AMBULATORY_CARE_PROVIDER_SITE_OTHER): Payer: Medicare Other | Admitting: Internal Medicine

## 2015-03-14 VITALS — BP 190/80 | HR 80 | Temp 97.9°F | Ht 65.0 in | Wt 155.3 lb

## 2015-03-14 DIAGNOSIS — R140 Abdominal distension (gaseous): Secondary | ICD-10-CM

## 2015-03-14 DIAGNOSIS — K5909 Other constipation: Secondary | ICD-10-CM

## 2015-03-14 NOTE — Patient Instructions (Addendum)
Continue the Miralax.  Gas X as needed

## 2015-03-14 NOTE — Progress Notes (Addendum)
Subjective:    Patient ID: Stacy Webb, female    DOB: [birth year], 69 y.o.   MRN: [id number]  HPI Present today with c/o bloating. She says she has been having problems with constipation.. She says 2 weeks ago she looked like she was 9 months presents.  She says when she was constipated, her stool was like small balls and hard as a rock.  She saw Dr. Karie Kirks last Monday and he told her to take Miralax daily. The Miralax helps. She is having a BM usually every day. Her stools are better now. Softer.  She says she has bloating. She had bloating off and on for over a year. Her appetite is normal.  There has been no weight loss. She says she has actually gained weight. She denies any acie reflux.   Family hx of colon cancer in an aunt in her late 71s  08/28/2012 Colonoscopy, incomplete hepatic flexure. Indications: Patient is 69 year old female who has heme-positive stools but no GI symptoms. She takes low-dose aspirin and naproxen 250 mg by mouth daily. Impression:  Incomplete examination the hepatic flexure. Multiple diverticula at sigmoid colon and few more descending and transverse colon. Unusual loop in sigmoid colon prevented completion of exam.  09/11/2012: DG Colon with air IMPRESSION: 1. Study was technically difficult for reasons given above, with somewhat limited evaluation of the colon as a result. 2. Numerous diverticula. 3. No large polyp, stricture or mass. 4. Possible gallstone.   Review of Systems     Past Medical History:  Diagnosis Date  . Anemia   . Anxiety   . Arthritis    fingers  . 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[place])   . Diabetes mellitus without complication ([place])   . Excessive hair growth 07/19/2015  . Heart murmur   . History of kidney stones   . Hyperlipidemia   . Hypertension   . Neuromuscular disorder ([place])    diabetic neuropathy  . Neuropathy   . Postmenopausal 07/19/2015    Past Surgical History:  Procedure Laterality Date   . ANTERIOR AND POSTERIOR REPAIR N/A 04/28/2018   Procedure: ANTERIOR (CYSTOCELE) AND POSTERIOR REPAIR (RECTOCELE);  Surgeon: Jonnie Kind, MD;  Location: [facility];  Service: Gynecology;  Laterality: N/A;  . APPENDECTOMY    . CARPAL TUNNEL RELEASE Bilateral   . CESAREAN SECTION    . COLONOSCOPY N/A 08/28/2012   Procedure: COLONOSCOPY;  Surgeon: Rogene Houston, MD;  Location: [facility];  Service: Endoscopy;  Laterality: N/A;  915  . COLONOSCOPY N/A 12/25/2017   Procedure: COLONOSCOPY;  Surgeon: Rogene Houston, MD;  Location: [facility];  Service: Endoscopy;  Laterality: N/A;  12:45    Allergies  Allergen Reactions  . Clindamycin/Lincomycin Other (See Comments)    Mouth taste like metal  . Enalapril Other (See Comments) and Cough    Constant Cough    Current Outpatient Medications on File Prior to Visit  Medication Sig Dispense Refill  . metFORMIN (GLUCOPHAGE) 1000 MG tablet Take 1,000 mg by mouth 2 (two) times daily with a meal.     No current facility-administered medications on file prior to visit.             Objective:   Physical Exam Blood pressure 190/80, pulse 80, temperature 97.9 F (36.6 C), height 5\' 5"  (1.651 m), weight 155 lb 4.8 oz (70.444 kg).  Alert and oriented. Skin warm and dry. Oral mucosa is moist.   . Sclera anicteric, conjunctivae is  pink. Thyroid not enlarged. No cervical lymphadenopathy. Lungs clear. Heart regular rate and rhythm.  Abdomen is soft. Bowel sounds are positive. No hepatomegaly. No abdominal masses felt. No tenderness.  No edema to lower extremities.        Assessment & Plan:  Constipation which is better now since starting the MIralax.  Continue the Miralax. May try 1/2 scoop. I discussed with Dr Sheliah Plane as needed

## 2015-06-06 ENCOUNTER — Telehealth (INDEPENDENT_AMBULATORY_CARE_PROVIDER_SITE_OTHER): Payer: Self-pay | Admitting: Internal Medicine

## 2015-06-06 NOTE — Telephone Encounter (Signed)
Ms. Oflynn left a message saying she's still bloated even though she takes Miralax daily as instructed by Deberah Castle. At this point, she'd like to see Dr. Laural Golden only. She's wondering how quickly she can get in to see him and would like a phone call regarding this.  Pt's ph# [phone] Thank you.

## 2015-06-06 NOTE — Telephone Encounter (Signed)
Offer patient 1:45 3/14

## 2015-06-07 NOTE — Telephone Encounter (Signed)
I left Stacy Webb a message to let her know Dr. Laural Golden is willing to work her in on this day and time and asked that she call back to confirm if she can make it. Thank you.

## 2015-06-20 ENCOUNTER — Ambulatory Visit (INDEPENDENT_AMBULATORY_CARE_PROVIDER_SITE_OTHER): Payer: Medicare Other | Admitting: Internal Medicine

## 2015-07-19 ENCOUNTER — Encounter: Payer: Self-pay | Admitting: Adult Health

## 2015-07-19 ENCOUNTER — Ambulatory Visit (INDEPENDENT_AMBULATORY_CARE_PROVIDER_SITE_OTHER): Payer: Medicare Other | Admitting: Adult Health

## 2015-07-19 VITALS — BP 150/86 | HR 72 | Ht 65.0 in | Wt 146.0 lb

## 2015-07-19 DIAGNOSIS — Z780 Asymptomatic menopausal state: Secondary | ICD-10-CM

## 2015-07-19 DIAGNOSIS — L689 Hypertrichosis, unspecified: Secondary | ICD-10-CM

## 2015-07-19 HISTORY — DX: Asymptomatic menopausal state: Z78.0

## 2015-07-19 HISTORY — DX: Hypertrichosis, unspecified: L68.9

## 2015-07-19 NOTE — Progress Notes (Signed)
Subjective:     Patient ID: Stacy Webb, female   DOB: 03/02/1946, 70 y.o.   MRN: [id number]  HPI Stacy Webb is a 70 year old white female, widowed in complaining of increased facial hair, and wondered if hormones would help. PCP is Dr Karie Kirks.  Review of Systems +increased facial hair Reviewed past medical,surgical, social and family history. Reviewed medications and allergies.     Objective:   Physical Exam BP 150/86 mmHg  Pulse 72  Ht 5\' 5"  (1.651 m)  Wt 146 lb (66.225 kg)  BMI 24.30 kg/m2 Skin warm and dry. Neck: mid line trachea, normal thyroid, good ROM, no lymphadenopathy noted. Lungs: clear to ausculation bilaterally. Cardiovascular: regular rate and rhythm.Has increased fine hair growth on face, and occasional coarse hair on chin,discussed would not use HRT for this, do not shave, could do laser but waxing cheaper    Assessment:     Postmenopausal Excessive facial hair growth     Plan:     Try waxing Follow up prn

## 2015-07-19 NOTE — Patient Instructions (Signed)
Try waxing Follow up prn

## 2015-10-02 ENCOUNTER — Ambulatory Visit (INDEPENDENT_AMBULATORY_CARE_PROVIDER_SITE_OTHER): Payer: Medicare Other | Admitting: Otolaryngology

## 2015-10-23 ENCOUNTER — Ambulatory Visit (INDEPENDENT_AMBULATORY_CARE_PROVIDER_SITE_OTHER): Payer: Medicare Other | Admitting: Otolaryngology

## 2015-10-23 DIAGNOSIS — H6121 Impacted cerumen, right ear: Secondary | ICD-10-CM | POA: Diagnosis not present

## 2015-10-23 DIAGNOSIS — H903 Sensorineural hearing loss, bilateral: Secondary | ICD-10-CM | POA: Diagnosis not present

## 2016-01-29 ENCOUNTER — Other Ambulatory Visit (HOSPITAL_COMMUNITY): Payer: Self-pay | Admitting: Family Medicine

## 2016-01-29 DIAGNOSIS — Z1231 Encounter for screening mammogram for malignant neoplasm of breast: Secondary | ICD-10-CM

## 2016-03-07 ENCOUNTER — Ambulatory Visit (HOSPITAL_COMMUNITY)
Admission: RE | Admit: 2016-03-07 | Discharge: 2016-03-07 | Disposition: A | Discharge status: Home / Self Care | Payer: Medicare Other | Source: Ambulatory Visit | Attending: Family Medicine | Admitting: Family Medicine

## 2016-03-07 DIAGNOSIS — Z1231 Encounter for screening mammogram for malignant neoplasm of breast: Secondary | ICD-10-CM | POA: Insufficient documentation

## 2016-04-24 ENCOUNTER — Encounter (HOSPITAL_COMMUNITY): Payer: Self-pay | Admitting: Emergency Medicine

## 2016-04-24 ENCOUNTER — Emergency Department (HOSPITAL_COMMUNITY): Payer: Medicare Other

## 2016-04-24 ENCOUNTER — Emergency Department (HOSPITAL_COMMUNITY)
Admission: EM | Admit: 2016-04-24 | Discharge: 2016-04-24 | Disposition: A | Discharge status: Home / Self Care | Payer: Medicare Other | Attending: Emergency Medicine | Admitting: Emergency Medicine

## 2016-04-24 DIAGNOSIS — S20212A Contusion of left front wall of thorax, initial encounter: Secondary | ICD-10-CM | POA: Diagnosis not present

## 2016-04-24 DIAGNOSIS — E119 Type 2 diabetes mellitus without complications: Secondary | ICD-10-CM | POA: Insufficient documentation

## 2016-04-24 DIAGNOSIS — Z79899 Other long term (current) drug therapy: Secondary | ICD-10-CM | POA: Insufficient documentation

## 2016-04-24 DIAGNOSIS — Y929 Unspecified place or not applicable: Secondary | ICD-10-CM | POA: Diagnosis not present

## 2016-04-24 DIAGNOSIS — Y9301 Activity, walking, marching and hiking: Secondary | ICD-10-CM | POA: Insufficient documentation

## 2016-04-24 DIAGNOSIS — Z7984 Long term (current) use of oral hypoglycemic drugs: Secondary | ICD-10-CM | POA: Insufficient documentation

## 2016-04-24 DIAGNOSIS — W1839XA Other fall on same level, initial encounter: Secondary | ICD-10-CM | POA: Insufficient documentation

## 2016-04-24 DIAGNOSIS — Y999 Unspecified external cause status: Secondary | ICD-10-CM | POA: Insufficient documentation

## 2016-04-24 DIAGNOSIS — I10 Essential (primary) hypertension: Secondary | ICD-10-CM | POA: Diagnosis not present

## 2016-04-24 DIAGNOSIS — M25512 Pain in left shoulder: Secondary | ICD-10-CM | POA: Insufficient documentation

## 2016-04-24 DIAGNOSIS — M7918 Myalgia, other site: Secondary | ICD-10-CM

## 2016-04-24 DIAGNOSIS — S299XXA Unspecified injury of thorax, initial encounter: Secondary | ICD-10-CM | POA: Diagnosis present

## 2016-04-24 MED ORDER — NAPROXEN 500 MG PO TABS: 500.0000 mg | ORAL_TABLET | Freq: Two times a day (BID) | ORAL | 0 refills | Status: DC

## 2016-04-24 MED ORDER — OXYCODONE-ACETAMINOPHEN 5-325 MG PO TABS: 1.0000 | ORAL_TABLET | Freq: Four times a day (QID) | ORAL | 0 refills | Status: DC | PRN

## 2016-04-24 MED ORDER — OXYCODONE-ACETAMINOPHEN 5-325 MG PO TABS
1.0000 | ORAL_TABLET | Freq: Four times a day (QID) | ORAL | 0 refills | Status: DC | PRN
Start: 2016-04-24 — End: 2017-06-10

## 2016-04-24 MED ORDER — IBUPROFEN 400 MG PO TABS
600.0000 mg | ORAL_TABLET | Freq: Once | ORAL | Status: AC
  Administered 2016-04-24: 600 mg via ORAL
  Filled 2016-04-24: qty 2

## 2016-04-24 MED ORDER — OXYCODONE-ACETAMINOPHEN 5-325 MG PO TABS
1.0000 | ORAL_TABLET | Freq: Once | ORAL | Status: AC
  Administered 2016-04-24: 1 via ORAL
  Filled 2016-04-24: qty 1

## 2016-04-24 NOTE — ED Provider Notes (Signed)
[facility] DEPT Provider Note   CSN: [id number] Arrival date & time: 04/24/16  1309     History   Chief Complaint Chief Complaint  Patient presents with  . Fall    HPI Stacy Webb is a 71 y.o. female.  HPI  71 y.o. female with a hx of DM, HTN, presents to the Emergency Department today due to mechanical fall. Pt ambulating outside and fell onto pineapple statue due to ice. Landed on left shoulder as well as left flank area. Noted redness around flank. No head trauma or LOC. Rates pain 6/10. Aching sensation. OTC remedies not attempted. No N/V/D. No CP/SOB.ABD pain. No other symptoms noted.    Past Medical History:  Diagnosis Date  . Anxiety   . Arthritis    fingers  . 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[place])   . Diabetes mellitus without complication ([place])   . Excessive hair growth 07/19/2015  . Hyperlipidemia   . Hypertension   . Mental disorder   . Neuromuscular disorder ([place])    diabetic neuropathy  . Postmenopausal 07/19/2015    Patient Active Problem List   Diagnosis Date Noted  . Postmenopausal 07/19/2015  . Excessive hair growth 07/19/2015  . Essential hypertension, benign 08/11/2012  . High cholesterol 08/11/2012  . Family hx of colon cancer 08/11/2012  . Diabetes ([place]) 08/11/2012    Past Surgical History:  Procedure Laterality Date  . APPENDECTOMY    . CARPAL TUNNEL RELEASE    . CESAREAN SECTION    . COLONOSCOPY N/A 08/28/2012   Procedure: COLONOSCOPY;  Surgeon: Rogene Houston, MD;  Location: [facility];  Service: Endoscopy;  Laterality: N/A;  54    OB History    Gravida Para Term Preterm AB Living   3 3       1    SAB TAB Ectopic Multiple Live Births                   Home Medications    Prior to Admission medications   Medication Sig Start Date End Date Taking? Authorizing Provider  diltiazem (CARDIZEM SR) 120 MG 12 hr capsule Take 120 mg by mouth 2 (two) times daily.    Historical Provider, MD  hydrochlorothiazide  (HYDRODIURIL) 25 MG tablet Take 25 mg by mouth. Takes three times a week    Historical Provider, MD  metFORMIN (GLUCOPHAGE) 1000 MG tablet Take 1,000 mg by mouth 2 (two) times daily with a meal.    Historical Provider, MD  polyethylene glycol (MIRALAX / GLYCOLAX) packet Take 17 g by mouth daily.    Historical Provider, MD    Family History Family History  Problem Relation Age of Onset  . Diabetes Daughter     Social History Social History  Substance Use Topics  . Smoking status: Never Smoker  . Smokeless tobacco: Never Used  . Alcohol use No     Allergies   Enalapril   Review of Systems Review of Systems ROS reviewed and all are negative for acute change except as noted in the HPI.  Physical Exam Updated Vital Signs Ht 5\' 5"  (1.651 m)   Wt 68 kg   BMI 24.96 kg/m   Physical Exam  Constitutional: She is oriented to person, place, and time. Vital signs are normal. She appears well-developed and well-nourished.  HENT:  Head: Normocephalic.  Right Ear: Hearing normal.  Left Ear: Hearing normal.  Eyes: Conjunctivae and EOM are normal. Pupils are equal, round, and reactive to light.  Neck: Normal range of motion. Neck supple.  Cardiovascular: Normal rate, regular rhythm, normal heart sounds and intact distal pulses.   Pulmonary/Chest: Effort normal and breath sounds normal. No accessory muscle usage. No respiratory distress. She has no decreased breath sounds.  Left lateral chest wall with visible erythema. No palpable deformities. Lungs CTA  Musculoskeletal: Normal range of motion.  Left Shoulder Negative hawkins test, negative Neer's test, no TTP over shoulder or elbow. No pain with flexion/extension/abduction/adduction internal or external rotation. No obvious bony deformity.  Neurological: She is alert and oriented to person, place, and time.  Skin: Skin is warm and dry.  Psychiatric: She has a normal mood and affect. Her speech is normal and behavior is normal.  Thought content normal.  Nursing note and vitals reviewed.  ED Treatments / Results  Labs (all labs ordered are listed, but only abnormal results are displayed) Labs Reviewed - No data to display  EKG  EKG Interpretation None       Radiology Dg Ribs Unilateral W/chest Left  Result Date: 04/24/2016 CLINICAL DATA:  Patient fell while walking outside.  Left rib pain. EXAM: LEFT RIBS AND CHEST - 3+ VIEW COMPARISON:  None. FINDINGS: The lungs are clear wiithout focal pneumonia, edema, pneumothorax or pleural effusion. Interstitial markings are diffusely coarsened with chronic features. Oblique views of the left ribs show no evidence for an acute displaced left-sided rib fracture. IMPRESSION: Negative. Electronically Signed   By: Misty Stanley M.D.   On: 04/24/2016 14:51   Dg Shoulder Left  Result Date: 04/24/2016 CLINICAL DATA:  Patient fell while walking outside. Left shoulder blade and flank injury. EXAM: LEFT SHOULDER - 2+ VIEW COMPARISON:  None. FINDINGS: No evidence of fracture. No evidence for shoulder separation or dislocation. Degenerative changes are seen at the acromion 0 and glenohumeral joints. Bones are demineralized. IMPRESSION: Negative. Electronically Signed   By: Misty Stanley M.D.   On: 04/24/2016 14:49    Procedures Procedures (including critical care time)  Medications Ordered in ED Medications - No data to display   Initial Impression / Assessment and Plan / ED Course  I have reviewed the triage vital signs and the nursing notes.  Pertinent labs & imaging results that were available during my care of the patient were reviewed by me and considered in my medical decision making (see chart for details).  Clinical Course    Final Clinical Impressions(s) / ED Diagnoses   {I have reviewed and evaluated the relevant imaging studies.  {I have reviewed the relevant previous healthcare records.  {I obtained HPI from historian. {Patient discussed with supervising  physician.  ED Course:  Assessment: Pt is a 41yF with hx HTN, DM who presents s/p mechanical fall on ice. No head trauma or LOC. Notes left shoulder and flank pain. On exam, pt in NAD. Nontoxic/nonseptic appearing. VSS. Afebrile. Lungs CTA. Heart RRR. Abdomen nontender soft. Visible erythema on left lateral chest wall. No palpable deformities. DG [place] unremarkable. CXR w/ Rib unremarkable. Given analgesia in ED. Plan is to DC home with follow up to PCP. I have reviewed the [place] Controlled Substance Reporting System. Given Rx #6 Percocet. Discussed alternative therapy for treatments. At time of discharge, Patient is in no acute distress. Vital Signs are stable. Patient is able to ambulate. Patient able to tolerate PO.   Disposition/Plan:  DC Home Additional Verbal discharge instructions given and discussed with patient.  Pt Instructed to f/u with PCP in the next week for evaluation and treatment of  symptoms. Return precautions given Pt acknowledges and agrees with plan  Supervising Physician Francine Graven, DO  Final diagnoses:  Acute pain of left shoulder  Contusion of rib on left side, initial encounter  Musculoskeletal pain    New Prescriptions New Prescriptions   No medications on file      Shary Decamp, PA-C 04/24/16 [place], DO 04/28/16 1733

## 2016-04-24 NOTE — Discharge Instructions (Signed)
Please read and follow all provided instructions.  Your diagnoses today include:  1. Acute pain of left shoulder   2. Contusion of rib on left side, initial encounter   3. Musculoskeletal pain     Tests performed today include: Vital signs. See below for your results today.   Medications prescribed:  Take as prescribed   Home care instructions:  Follow any educational materials contained in this packet.  Follow-up instructions: Please follow-up with your primary care provider for further evaluation of symptoms and treatment   Return instructions:  Please return to the Emergency Department if you do not get better, if you get worse, or new symptoms OR  - Fever (temperature greater than 101.43F)  - Bleeding that does not stop with holding pressure to the area    -Severe pain (please note that you may be more sore the day after your accident)  - Chest Pain  - Difficulty breathing  - Severe nausea or vomiting  - Inability to tolerate food and liquids  - Passing out  - Skin becoming red around your wounds  - Change in mental status (confusion or lethargy)  - New numbness or weakness    Please return if you have any other emergent concerns.  Additional Information:  Your vital signs today were: BP 165/68    Pulse 66    Temp 98 F (36.7 C) (Oral)    Resp 20    Ht 5\' 5"  (1.651 m)    Wt 68 kg    SpO2 98%    BMI 24.96 kg/m  If your blood pressure (BP) was elevated above 135/85 this visit, please have this repeated by your doctor within one month. ---------------

## 2016-04-24 NOTE — ED Triage Notes (Signed)
Per EMS: Pt fell walking outside and fell into a pineapple statue, injuring left shoulder blade and left flank area.  Pt has redness to flank area.  Pt denies head trauma or LOC.  Pt alert and oriented.

## 2016-04-29 MED FILL — Oxycodone w/ Acetaminophen Tab 5-325 MG: ORAL | Qty: 6 | Status: AC

## 2016-09-26 ENCOUNTER — Emergency Department (HOSPITAL_COMMUNITY): Payer: Medicare Other

## 2016-09-26 ENCOUNTER — Encounter (HOSPITAL_COMMUNITY): Payer: Self-pay | Admitting: Emergency Medicine

## 2016-09-26 ENCOUNTER — Observation Stay (HOSPITAL_COMMUNITY)
Admission: EM | Admit: 2016-09-26 | Discharge: 2016-09-27 | Disposition: A | Discharge status: Home / Self Care | Payer: Medicare Other | Attending: Internal Medicine | Admitting: Internal Medicine

## 2016-09-26 DIAGNOSIS — E11621 Type 2 diabetes mellitus with foot ulcer: Secondary | ICD-10-CM | POA: Diagnosis present

## 2016-09-26 DIAGNOSIS — Z7984 Long term (current) use of oral hypoglycemic drugs: Secondary | ICD-10-CM | POA: Insufficient documentation

## 2016-09-26 DIAGNOSIS — N183 Chronic kidney disease, stage 3 unspecified: Secondary | ICD-10-CM | POA: Diagnosis present

## 2016-09-26 DIAGNOSIS — E08621 Diabetes mellitus due to underlying condition with foot ulcer: Secondary | ICD-10-CM | POA: Diagnosis present

## 2016-09-26 DIAGNOSIS — L97501 Non-pressure chronic ulcer of other part of unspecified foot limited to breakdown of skin: Secondary | ICD-10-CM | POA: Diagnosis present

## 2016-09-26 DIAGNOSIS — M79671 Pain in right foot: Secondary | ICD-10-CM | POA: Diagnosis present

## 2016-09-26 DIAGNOSIS — E785 Hyperlipidemia, unspecified: Secondary | ICD-10-CM | POA: Diagnosis present

## 2016-09-26 DIAGNOSIS — L03115 Cellulitis of right lower limb: Principal | ICD-10-CM | POA: Insufficient documentation

## 2016-09-26 DIAGNOSIS — E11610 Type 2 diabetes mellitus with diabetic neuropathic arthropathy: Secondary | ICD-10-CM | POA: Diagnosis present

## 2016-09-26 DIAGNOSIS — Z79899 Other long term (current) drug therapy: Secondary | ICD-10-CM | POA: Diagnosis not present

## 2016-09-26 DIAGNOSIS — I10 Essential (primary) hypertension: Secondary | ICD-10-CM | POA: Diagnosis not present

## 2016-09-26 DIAGNOSIS — L97411 Non-pressure chronic ulcer of right heel and midfoot limited to breakdown of skin: Secondary | ICD-10-CM | POA: Insufficient documentation

## 2016-09-26 DIAGNOSIS — L97509 Non-pressure chronic ulcer of other part of unspecified foot with unspecified severity: Secondary | ICD-10-CM

## 2016-09-26 MED ORDER — CLINDAMYCIN PHOSPHATE 600 MG/50ML IV SOLN
600.0000 mg | Freq: Once | INTRAVENOUS | Status: AC
  Administered 2016-09-27: 600 mg via INTRAVENOUS
  Filled 2016-09-26: qty 50

## 2016-09-26 MED ORDER — CIPROFLOXACIN IN D5W 400 MG/200ML IV SOLN
400.0000 mg | Freq: Once | INTRAVENOUS | Status: DC
  Filled 2016-09-26: qty 200

## 2016-09-26 NOTE — ED Triage Notes (Signed)
Pt being treated for foot ulcer, tonight big toe is red and painful.

## 2016-09-27 DIAGNOSIS — N183 Chronic kidney disease, stage 3 unspecified: Secondary | ICD-10-CM | POA: Diagnosis present

## 2016-09-27 DIAGNOSIS — E08621 Diabetes mellitus due to underlying condition with foot ulcer: Secondary | ICD-10-CM | POA: Diagnosis present

## 2016-09-27 DIAGNOSIS — E785 Hyperlipidemia, unspecified: Secondary | ICD-10-CM

## 2016-09-27 DIAGNOSIS — L97412 Non-pressure chronic ulcer of right heel and midfoot with fat layer exposed: Secondary | ICD-10-CM

## 2016-09-27 DIAGNOSIS — E11610 Type 2 diabetes mellitus with diabetic neuropathic arthropathy: Secondary | ICD-10-CM

## 2016-09-27 DIAGNOSIS — L03115 Cellulitis of right lower limb: Secondary | ICD-10-CM | POA: Diagnosis not present

## 2016-09-27 DIAGNOSIS — E11621 Type 2 diabetes mellitus with foot ulcer: Secondary | ICD-10-CM

## 2016-09-27 DIAGNOSIS — L97509 Non-pressure chronic ulcer of other part of unspecified foot with unspecified severity: Secondary | ICD-10-CM

## 2016-09-27 DIAGNOSIS — I10 Essential (primary) hypertension: Secondary | ICD-10-CM | POA: Diagnosis present

## 2016-09-27 DIAGNOSIS — I159 Secondary hypertension, unspecified: Secondary | ICD-10-CM

## 2016-09-27 DIAGNOSIS — L97501 Non-pressure chronic ulcer of other part of unspecified foot limited to breakdown of skin: Secondary | ICD-10-CM | POA: Diagnosis present

## 2016-09-27 HISTORY — DX: Type 2 diabetes mellitus with foot ulcer: E11.621

## 2016-09-27 HISTORY — DX: Type 2 diabetes mellitus with foot ulcer: L97.509

## 2016-09-27 LAB — CBC WITH DIFFERENTIAL/PLATELET
Basophils Absolute: 0 10*3/uL (ref 0.0–0.1)
Basophils Relative: 0 %
Eosinophils Absolute: 0.3 10*3/uL (ref 0.0–0.7)
Eosinophils Relative: 3 %
HEMATOCRIT: 30 % — AB (ref 36.0–46.0)
Hemoglobin: 10 g/dL — ABNORMAL LOW (ref 12.0–15.0)
LYMPHS ABS: 2.5 10*3/uL (ref 0.7–4.0)
LYMPHS PCT: 26 %
MCH: 27.5 pg (ref 26.0–34.0)
MCHC: 33.3 g/dL (ref 30.0–36.0)
MCV: 82.6 fL (ref 78.0–100.0)
MONO ABS: 1.4 10*3/uL — AB (ref 0.1–1.0)
MONOS PCT: 15 %
NEUTROS ABS: 5.4 10*3/uL (ref 1.7–7.7)
Neutrophils Relative %: 56 %
Platelets: 300 10*3/uL (ref 150–400)
RBC: 3.63 MIL/uL — ABNORMAL LOW (ref 3.87–5.11)
RDW: 14.7 % (ref 11.5–15.5)
WBC: 9.7 10*3/uL (ref 4.0–10.5)

## 2016-09-27 LAB — COMPREHENSIVE METABOLIC PANEL
ALBUMIN: 3.9 g/dL (ref 3.5–5.0)
ALK PHOS: 56 U/L (ref 38–126)
ALT: 19 U/L (ref 14–54)
ANION GAP: 13 (ref 5–15)
AST: 27 U/L (ref 15–41)
BUN: 29 mg/dL — ABNORMAL HIGH (ref 6–20)
CALCIUM: 9.5 mg/dL (ref 8.9–10.3)
CO2: 24 mmol/L (ref 22–32)
Chloride: 102 mmol/L (ref 101–111)
Creatinine, Ser: 1.59 mg/dL — ABNORMAL HIGH (ref 0.44–1.00)
GFR calc Af Amer: 37 mL/min — ABNORMAL LOW (ref >60)
GFR calc non Af Amer: 32 mL/min — ABNORMAL LOW (ref >60)
GLUCOSE: 102 mg/dL — AB (ref 65–99)
POTASSIUM: 4.1 mmol/L (ref 3.5–5.1)
SODIUM: 139 mmol/L (ref 135–145)
Total Bilirubin: 0.2 mg/dL — ABNORMAL LOW (ref 0.3–1.2)
Total Protein: 7.3 g/dL (ref 6.5–8.1)

## 2016-09-27 LAB — GLUCOSE, CAPILLARY
Glucose-Capillary: 95 mg/dL (ref 65–99)
Glucose-Capillary: 75 mg/dL (ref 65–99)

## 2016-09-27 LAB — C-REACTIVE PROTEIN: CRP: 3 mg/dL — ABNORMAL HIGH (ref <1.0)

## 2016-09-27 LAB — SEDIMENTATION RATE: Sed Rate: 68 mm/hr — ABNORMAL HIGH (ref 0–22)

## 2016-09-27 MED ORDER — POLYETHYLENE GLYCOL 3350 17 G PO PACK
17.0000 g | PACK | Freq: Every day | ORAL | Status: DC
  Administered 2016-09-27: 17 g via ORAL
  Filled 2016-09-27: qty 1

## 2016-09-27 MED ORDER — VANCOMYCIN HCL IN DEXTROSE 1-5 GM/200ML-% IV SOLN
1000.0000 mg | Freq: Once | INTRAVENOUS | Status: AC
  Administered 2016-09-27: 1000 mg via INTRAVENOUS
  Filled 2016-09-27: qty 200

## 2016-09-27 MED ORDER — PIPERACILLIN-TAZOBACTAM 3.375 G IVPB
3.3750 g | Freq: Three times a day (TID) | INTRAVENOUS | Status: DC
  Administered 2016-09-27: 3.375 g via INTRAVENOUS
  Filled 2016-09-27: qty 50

## 2016-09-27 MED ORDER — DILTIAZEM HCL ER 60 MG PO CP12
120.0000 mg | ORAL_CAPSULE | Freq: Two times a day (BID) | ORAL | Status: DC
  Administered 2016-09-27: 120 mg via ORAL
  Filled 2016-09-27 (×7): qty 2

## 2016-09-27 MED ORDER — CLINDAMYCIN HCL 300 MG PO CAPS: 300.0000 mg | ORAL_CAPSULE | Freq: Three times a day (TID) | ORAL | 0 refills | Status: AC

## 2016-09-27 MED ORDER — MUPIROCIN CALCIUM 2 % EX CREA
TOPICAL_CREAM | Freq: Every day | CUTANEOUS | Status: DC
  Administered 2016-09-27: 10:00:00 via TOPICAL
  Filled 2016-09-27: qty 15

## 2016-09-27 MED ORDER — INSULIN ASPART 100 UNIT/ML ~~LOC~~ SOLN: 0.0000 [IU] | Freq: Three times a day (TID) | SUBCUTANEOUS | Status: DC

## 2016-09-27 MED ORDER — MUPIROCIN CALCIUM 2 % EX CREA: TOPICAL_CREAM | Freq: Every day | CUTANEOUS | 0 refills | Status: DC

## 2016-09-27 MED ORDER — SULFAMETHOXAZOLE-TRIMETHOPRIM 800-160 MG PO TABS: 1.0000 | ORAL_TABLET | Freq: Two times a day (BID) | ORAL | 0 refills | Status: AC

## 2016-09-27 MED ORDER — PIPERACILLIN-TAZOBACTAM 3.375 G IVPB
3.3750 g | Freq: Once | INTRAVENOUS | Status: AC
  Administered 2016-09-27: 3.375 g via INTRAVENOUS
  Filled 2016-09-27: qty 50

## 2016-09-27 MED ORDER — OXYCODONE-ACETAMINOPHEN 5-325 MG PO TABS: 1.0000 | ORAL_TABLET | Freq: Four times a day (QID) | ORAL | Status: DC | PRN

## 2016-09-27 MED ORDER — GABAPENTIN 100 MG PO CAPS
100.0000 mg | ORAL_CAPSULE | Freq: Three times a day (TID) | ORAL | Status: DC
  Administered 2016-09-27: 100 mg via ORAL
  Filled 2016-09-27: qty 1

## 2016-09-27 MED ORDER — HEPARIN SODIUM (PORCINE) 5000 UNIT/ML IJ SOLN
5000.0000 [IU] | Freq: Three times a day (TID) | INTRAMUSCULAR | Status: DC
  Administered 2016-09-27: 5000 [IU] via SUBCUTANEOUS
  Filled 2016-09-27: qty 1

## 2016-09-27 MED ORDER — INSULIN ASPART 100 UNIT/ML ~~LOC~~ SOLN: 0.0000 [IU] | Freq: Every day | SUBCUTANEOUS | Status: DC

## 2016-09-27 MED ORDER — METFORMIN HCL 500 MG PO TABS
1000.0000 mg | ORAL_TABLET | Freq: Two times a day (BID) | ORAL | Status: DC
Start: 2016-09-27 — End: 2016-09-27
  Administered 2016-09-27: 1000 mg via ORAL
  Filled 2016-09-27: qty 2

## 2016-09-27 NOTE — H&P (Addendum)
History and Physical    Stacy Webb [id number] DOB: July 29, 1945 DOA: 09/26/2016   Addendum to patient's past medical history. 1.  Excessive hair growth This diagnosis seems to have auto populated from her previous visit to Derrek Monaco, NP on 07/19/2015 for increased facial hair.  Subsequent visits to the ED do not address this issue and I will consider this issue to have resolved.  #2. mental disorder I am not sure where this diagnosis was pulled from.  I have reviewed her outpatient visit and documentation dating back to 2014 and there are no documentation for any mental disorder or patient's medication list showing her to be on any medication for such.  I have not seen or examined this patient and am writing on behalf of of Dr. Marin Comment who is not working with the group anymore.  Based on detailed review of her medical records I think both of these diagnoses can be removed from her medical history.   PCP: [name], MD  Patient coming from: Home.    Chief Complaint:  Foot ulcer.   HPI: Stacy Webb is an 71 y.o. female with hx of DM2 on Metformin, HTN, HLD, charcot joint, presented to the ER as she has a serous discharge on an ulcer incurred from moving her grass wearing rubber boots.  She saw her podiatrist in [place] and was placed on Bactrim DS for which she took for 2 days.  The area appeared a little more red, and there was discharge from it.  She did not have any pain, as she has neuropathy.  She was started on IV Van/Clinda and hospitalist was asked to admit her for further Tx.    ED Course:  See above.  Rewiew of Systems:  Constitutional: Negative for malaise, fever and chills. No significant weight loss or weight gain Eyes: Negative for eye pain, redness and discharge, diplopia, visual changes, or flashes of light. ENMT: Negative for ear pain, hoarseness, nasal congestion, sinus pressure and sore throat. No headaches; tinnitus, drooling, or problem  swallowing. Cardiovascular: Negative for chest pain, palpitations, diaphoresis, dyspnea and peripheral edema. ; No orthopnea, PND Respiratory: Negative for cough, hemoptysis, wheezing and stridor. No pleuritic chestpain. Gastrointestinal: Negative for diarrhea, constipation,  melena, blood in stool, hematemesis, jaundice and rectal bleeding.    Genitourinary: Negative for frequency, dysuria, incontinence,flank pain and hematuria; Musculoskeletal: Negative for back pain and neck pain. Negative for swelling and trauma.;  Skin: . Negative for pruritus, rash, abrasions, bruising and skin lesion.;  Neuro: Negative for headache, lightheadedness and neck stiffness. Negative for weakness, altered level of consciousness , altered mental status, extremity weakness, burning feet, involuntary movement, seizure and syncope.  Psych: negative for anxiety, depression, insomnia, tearfulness, panic attacks, hallucinations, paranoia, suicidal or homicidal ideation    Past Medical History:  Diagnosis Date  . Anxiety   . Arthritis    fingers  . 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[place])   . Diabetes mellitus without complication ([place])   . Excessive hair growth 07/19/2015  . Hyperlipidemia   . Hypertension   . Mental disorder   . Neuromuscular disorder ([place])    diabetic neuropathy  . Postmenopausal 07/19/2015    Past Surgical History:  Procedure Laterality Date  . APPENDECTOMY    . CARPAL TUNNEL RELEASE    . CESAREAN SECTION    . COLONOSCOPY N/A 08/28/2012   Procedure: COLONOSCOPY;  Surgeon: Rogene Houston, MD;  [facility];  Service: Endoscopy;  Laterality: N/A;  915     reports that she has never smoked. She has never used smokeless tobacco. She reports that she does not drink alcohol or use drugs.  Allergies  Allergen Reactions  . Enalapril     Family History  Problem Relation Age of Onset  . Diabetes Daughter      Prior to Admission medications   Medication Sig Start Date  End Date Taking? Authorizing Provider  diltiazem (CARDIZEM SR) 120 MG 12 hr capsule Take 120 mg by mouth 2 (two) times daily.    [provider]  gabapentin (NEURONTIN) 100 MG capsule Take 100 mg by mouth 3 (three) times daily.    [provider]  hydrochlorothiazide (HYDRODIURIL) 25 MG tablet Take 25 mg by mouth. Takes three times a week    [provider]  losartan (COZAAR) 50 MG tablet Take 50 mg by mouth daily.    [provider]  metFORMIN (GLUCOPHAGE) 1000 MG tablet Take 1,000 mg by mouth 2 (two) times daily with a meal.    [provider]  naproxen (NAPROSYN) 500 MG tablet Take 1 tablet (500 mg total) by mouth 2 (two) times daily. 04/24/16   Shary Decamp, PA-C  oxyCODONE-acetaminophen (PERCOCET/ROXICET) 5-325 MG tablet Take 1 tablet by mouth every 6 (six) hours as needed for severe pain. 04/24/16   Shary Decamp, PA-C  polyethylene glycol (MIRALAX / Floria Raveling) packet Take 17 g by mouth daily.    [provider]    Physical Exam: Vitals:   09/26/16 2159 09/27/16 0309  BP: (!) 156/69 (!) 147/69  Pulse: 75 68  Resp: 20 18  Temp: 98.2 F (36.8 C) 97.6 F (36.4 C)  TempSrc: Oral Oral  SpO2: 100% 94%  Weight: 68.9 kg (152 lb)   Height: 5\' 6"  (1.676 m)       Constitutional: NAD, calm, comfortable Vitals:   09/26/16 2159 09/27/16 0309  BP: (!) 156/69 (!) 147/69  Pulse: 75 68  Resp: 20 18  Temp: 98.2 F (36.8 C) 97.6 F (36.4 C)  TempSrc: Oral Oral  SpO2: 100% 94%  Weight: 68.9 kg (152 lb)   Height: 5\' 6"  (1.676 m)    Eyes: PERRL, lids and conjunctivae normal ENMT: Mucous membranes are moist. Posterior pharynx clear of any exudate or lesions.Normal dentition.  Neck: normal, supple, no masses, no thyromegaly Respiratory: clear to auscultation bilaterally, no wheezing, no crackles. Normal respiratory effort. No accessory muscle use.  Cardiovascular: Regular rate and rhythm, no murmurs / rubs / gallops. No extremity edema. 2+  pedal pulses. No carotid bruits.  Abdomen: no tenderness, no masses palpated. No hepatosplenomegaly. Bowel sounds positive.  Musculoskeletal: no clubbing / cyanosis. No joint deformity upper and lower extremities. Good ROM, no contractures. Normal muscle tone.  Skin: no rashes, lesions, ulcers. No induration.  Wound base look clean.  Neurologic: CN 2-12 grossly intact. Sensation intact, DTR normal. Strength 5/5 in all 4.  Psychiatric: Normal judgment and insight. Alert and oriented x 3. Normal mood.   Labs on Admission: I have personally reviewed following labs and imaging studies CBC:  Recent Labs Lab 09/27/16 0005  WBC 9.7  NEUTROABS 5.4  HGB 10.0*  HCT 30.0*  MCV 82.6  PLT 007   Basic Metabolic Panel:  Recent Labs Lab 09/27/16 0005  NA 139  K 4.1  CL 102  CO2 24  GLUCOSE 102*  BUN 29*  CREATININE 1.59*  CALCIUM 9.5   GFR: Estimated Creatinine Clearance: 30.8 mL/min (A) (by C-G formula based on  SCr of 1.59 mg/dL (H)). Liver Function Tests:  Recent Labs Lab 09/27/16 0005  AST 27  ALT 19  ALKPHOS 56  BILITOT 0.2*  PROT 7.3  ALBUMIN 3.9   Radiological Exams on Admission: Dg Foot Complete Right  Result Date: 09/27/2016 CLINICAL DATA:  Diabetic foot ulcer. Patient reports being treated for Charcot foot. Recent injury with "gash" to distal first metatarsal. Redness and pain. EXAM: RIGHT FOOT COMPLETE - 3+ VIEW COMPARISON:  None. FINDINGS: Chronic deformity of the midfoot with loss of normal alignment, cystic change throughout the tarsal bones and proximal metatarsals and moderate proliferative change. There are hammertoe deformities of the digits. Possible nondisplaced fracture of the great toe proximal phalanx without intra-articular extension. There is no bony destructive change or periosteal reaction. Plantar soft tissue defect is noted subjacent to the first metatarsal phalangeal joint. Prominent plantar calcaneal spur and Achilles tendon enthesophyte. Possible  plantar soft tissue edema. No radiopaque foreign body. No tracking soft tissue air. IMPRESSION: 1. Plantar soft tissue defect subjacent to the first metatarsal phalangeal joint, possible soft tissue ulcer. No radiographic findings of osteomyelitis. 2. Possible nondisplaced fracture of the great toe proximal phalanx. 3. Chronic midfoot deformity.  Hammertoe deformity of the digits. Electronically Signed   By: Jeb Levering M.D.   On: 09/27/2016 01:25    EKG: Independently reviewed.  Assessment/Plan Principal Problem:   Diabetic foot ulcer ([place]) Active Problems:   CKD (chronic kidney disease), stage III   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[place])   Hyperlipidemia   Hypertension   Diabetic ulcer of foot associated with diabetes mellitus due to underlying condition, limited to breakdown of skin ([place])    PLAN:   Diabetic foot ulcer:  Will give her IV Zosyn per pharmacy's dosing.  Wet to dry dressing.  CKD:  Avoid nephrotoxic drug.  I am not convinced she has CKD 3.  Will follow Cr.  Hold Losartan and d/c NSAIDS for now.   HTN:  BP is slightly elevated.  Will follow.  DM:  Will add SSI with sensitive scale.     DVT prophylaxis: SubQ heparin.  Code Status: FULL CODE.  Family Communication: daughter at bedside.  Disposition Plan: Home.  Consults called: None.  Admission status: inpatient.   LE,PETER MD FACP. [facility]  If 7PM-7AM, please contact night-coverage [email or web address] Password [facility]  09/27/2016, 3:17 AM

## 2016-09-27 NOTE — ED Provider Notes (Signed)
[place] DEPT Provider Note   CSN: [id number] Arrival date & time: 09/26/16  2137  Time seen 23:30 PM   History   Chief Complaint Chief Complaint  Patient presents with  . Foot Ulcer    HPI Stacy Webb is a 71 y.o. female.  HPI  patient has a history of diabetes and Charcot foot. She states on June 15 she mowed her grass using a push lawnmower and was wearing rubber boots. She states she rubbed an ulcer on the bottom of her right foot. She happened to have a appointment with her podiatrist, Dr. Berline Lopes on June 19. He opened the blister that was present and started her on oral Bactrim. She states tonight she started having some pain and she started noticed redness and swelling around her right great toe. She denies fever or chills. She states her CBG was 120 yesterday.   PCP [name], MD  Past Medical History:  Diagnosis Date  . Anxiety   . Arthritis    fingers  . 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[place])   . Diabetes mellitus without complication ([place])   . Excessive hair growth 07/19/2015  . Hyperlipidemia   . Hypertension   . Mental disorder   . Neuromuscular disorder ([place])    diabetic neuropathy  . Postmenopausal 07/19/2015    Patient Active Problem List   Diagnosis Date Noted  . Postmenopausal 07/19/2015  . Excessive hair growth 07/19/2015  . Essential hypertension, benign 08/11/2012  . High cholesterol 08/11/2012  . Family hx of colon cancer 08/11/2012  . Diabetes ([place]) 08/11/2012    Past Surgical History:  Procedure Laterality Date  . APPENDECTOMY    . CARPAL TUNNEL RELEASE    . CESAREAN SECTION    . COLONOSCOPY N/A 08/28/2012   Procedure: COLONOSCOPY;  Surgeon: Rogene Houston, MD;  Location: [facility];  Service: Endoscopy;  Laterality: N/A;  60    OB History    Gravida Para Term Preterm AB Living   3 3       1    SAB TAB Ectopic Multiple Live Births                   Home Medications    Prior to Admission medications     Medication Sig Start Date End Date Taking? Authorizing Provider  diltiazem (CARDIZEM SR) 120 MG 12 hr capsule Take 120 mg by mouth 2 (two) times daily.    [provider]  gabapentin (NEURONTIN) 100 MG capsule Take 100 mg by mouth 3 (three) times daily.    [provider]  hydrochlorothiazide (HYDRODIURIL) 25 MG tablet Take 25 mg by mouth. Takes three times a week    [provider]  losartan (COZAAR) 50 MG tablet Take 50 mg by mouth daily.    [provider]  metFORMIN (GLUCOPHAGE) 1000 MG tablet Take 1,000 mg by mouth 2 (two) times daily with a meal.    [provider]  naproxen (NAPROSYN) 500 MG tablet Take 1 tablet (500 mg total) by mouth 2 (two) times daily. 04/24/16   Shary Decamp, PA-C  oxyCODONE-acetaminophen (PERCOCET/ROXICET) 5-325 MG tablet Take 1 tablet by mouth every 6 (six) hours as needed for severe pain. 04/24/16   Shary Decamp, PA-C  polyethylene glycol (MIRALAX / Floria Raveling) packet Take 17 g by mouth daily.    [provider]    Family History Family History  Problem Relation Age of Onset  . Diabetes Daughter  Social History Social History  Substance Use Topics  . Smoking status: Never Smoker  . Smokeless tobacco: Never Used  . Alcohol use No  lives at home   Allergies   Enalapril   Review of Systems Review of Systems  All other systems reviewed and are negative.    Physical Exam Updated Vital Signs BP (!) 156/69 (BP Location: Right Arm)   Pulse 75   Temp 98.2 F (36.8 C) (Oral)   Resp 20   Ht 5\' 6"  (1.676 m)   Wt 68.9 kg (152 lb)   SpO2 100%   BMI 24.53 kg/m   Vital signs normal    Physical Exam  Constitutional: She is oriented to person, place, and time. She appears well-developed and well-nourished.  HENT:  Head: Normocephalic and atraumatic.  Right Ear: External ear normal.  Left Ear: External ear normal.  Eyes: Conjunctivae and EOM are normal.  Neck: Normal range of motion.   Cardiovascular: Regular rhythm.   Pulmonary/Chest: Effort normal. No respiratory distress.  Musculoskeletal: She exhibits deformity.  Pt has deformity c/w charcot foot  Neurological: She is alert and oriented to person, place, and time. No cranial nerve deficit.  Skin: Skin is warm and dry. There is erythema.  A shin has a large superficial ulcer on the bottom of her right foot near the MTP joint of the great toe. There is noted to be some increased swelling and redness encompassing the medial aspect of the foot and involving the base of the right great toe. She has good distal pulses.  Nursing note and vitals reviewed.        ED Treatments / Results  Labs (all labs ordered are listed, but only abnormal results are displayed) Results for orders placed or performed during the hospital encounter of 09/26/16  Comprehensive metabolic panel  Result Value Ref Range   Sodium 139 135 - 145 mmol/L   Potassium 4.1 3.5 - 5.1 mmol/L   Chloride 102 101 - 111 mmol/L   CO2 24 22 - 32 mmol/L   Glucose, Bld 102 (H) 65 - 99 mg/dL   BUN 29 (H) 6 - 20 mg/dL   Creatinine, Ser 1.59 (H) 0.44 - 1.00 mg/dL   Calcium 9.5 8.9 - 10.3 mg/dL   Total Protein 7.3 6.5 - 8.1 g/dL   Albumin 3.9 3.5 - 5.0 g/dL   AST 27 15 - 41 U/L   ALT 19 14 - 54 U/L   Alkaline Phosphatase 56 38 - 126 U/L   Total Bilirubin 0.2 (L) 0.3 - 1.2 mg/dL   GFR calc non Af Amer 32 (L) >60 mL/min   GFR calc Af Amer 37 (L) >60 mL/min   Anion gap 13 5 - 15  CBC with Differential  Result Value Ref Range   WBC 9.7 4.0 - 10.5 K/uL   RBC 3.63 (L) 3.87 - 5.11 MIL/uL   Hemoglobin 10.0 (L) 12.0 - 15.0 g/dL   HCT 30.0 (L) 36.0 - 46.0 %   MCV 82.6 78.0 - 100.0 fL   MCH 27.5 26.0 - 34.0 pg   MCHC 33.3 30.0 - 36.0 g/dL   RDW 14.7 11.5 - 15.5 %   Platelets 300 150 - 400 K/uL   Neutrophils Relative % 56 %   Neutro Abs 5.4 1.7 - 7.7 K/uL   Lymphocytes Relative 26 %   Lymphs Abs 2.5 0.7 - 4.0 K/uL   Monocytes Relative 15 %   Monocytes  Absolute 1.4 (H) 0.1 - 1.0 K/uL  Eosinophils Relative 3 %   Eosinophils Absolute 0.3 0.0 - 0.7 K/uL   Basophils Relative 0 %   Basophils Absolute 0.0 0.0 - 0.1 K/uL  Sedimentation rate  Result Value Ref Range   Sed Rate 68 (H) 0 - 22 mm/hr   Laboratory interpretation all normal except Elevated sedimentation rate, new renal insufficiency, stable anemia    EKG  EKG Interpretation None       Radiology Dg Foot Complete Right  Result Date: 09/27/2016 CLINICAL DATA:  Diabetic foot ulcer. Patient reports being treated for Charcot foot. Recent injury with "gash" to distal first metatarsal. Redness and pain. EXAM: RIGHT FOOT COMPLETE - 3+ VIEW COMPARISON:  None. FINDINGS: Chronic deformity of the midfoot with loss of normal alignment, cystic change throughout the tarsal bones and proximal metatarsals and moderate proliferative change. There are hammertoe deformities of the digits. Possible nondisplaced fracture of the great toe proximal phalanx without intra-articular extension. There is no bony destructive change or periosteal reaction. Plantar soft tissue defect is noted subjacent to the first metatarsal phalangeal joint. Prominent plantar calcaneal spur and Achilles tendon enthesophyte. Possible plantar soft tissue edema. No radiopaque foreign body. No tracking soft tissue air. IMPRESSION: 1. Plantar soft tissue defect subjacent to the first metatarsal phalangeal joint, possible soft tissue ulcer. No radiographic findings of osteomyelitis. 2. Possible nondisplaced fracture of the great toe proximal phalanx. 3. Chronic midfoot deformity.  Hammertoe deformity of the digits. Electronically Signed   By: Jeb Levering M.D.   On: 09/27/2016 01:25    Procedures Procedures (including critical care time)  Medications Ordered in ED Medications  ciprofloxacin (CIPRO) IVPB 400 mg (not administered)  clindamycin (CLEOCIN) IVPB 600 mg (0 mg Intravenous Stopped 09/27/16 0049)  vancomycin (VANCOCIN)  IVPB 1000 mg/200 mL premix (0 mg Intravenous Stopped 09/27/16 0207)     Initial Impression / Assessment and Plan / ED Course  I have reviewed the triage vital signs and the nursing notes.  Pertinent labs & imaging results that were available during my care of the patient were reviewed by me and considered in my medical decision making (see chart for details).  Patient has already been on outpatient oral Septra for 48 hours, she presents with worsening swelling, pain, and redness of her right great toe/distal foot. She was started on IV antibiotics. Laboratory testing and x-rays were ordered. We discussed that she might need inpatient admission for IV antibiotics and she is failing outpatient oral antibiotic treatment.  2:15 AM we discussed her test results. We again discussed admission and she is agreeable.  02:25 AM Dr Marin Comment, hospitalist, will admit.  Final Clinical Impressions(s) / ED Diagnoses   Final diagnoses:  Cellulitis of foot, right  Diabetic ulcer of right midfoot associated with diabetes mellitus due to underlying condition, limited to breakdown of skin [facility])    Plan admission  Rolland Porter, MD, Barbette Or, MD 09/27/16 [phone]

## 2016-09-27 NOTE — Care Management (Signed)
Pt seen and chart reviewed for CM needs. Pt from home, ind with ADlL's.. Has PCP, transportation and insurance with drug coverage. Pt here for diabetic foot ulcer. She is able to perform her own wound care at [facility]. No CM Needs.

## 2016-09-27 NOTE — Consult Note (Signed)
[place] Nurse wound consult note Reason for Consult:Neuropathic ulcer to right plantar foot near first metatarsal head.  Trauma, present on admission.   Wound type:Neuropathic wound Pressure Injury POA: Yes Measurement:2.4 cm x 2 cm x 0.2 cm  Wound NID:POEU pink and granulating Drainage (amount, consistency, odor) minimal serosanguinous  No odor.  Periwound:intact  Noted foot deformity- charcot deformity Dressing procedure/placement/frequency:Cleanse right plantar foot with NS and pat gently dry.  Apply mupirocin cream to wound bed.  Cover with 4x4 gauze and lightly wrap with kerlix and tape.  Change daily.  Will not follow at this time.  Please re-consult if needed.  Domenic Moras RN BSN [place] Pager [phone]

## 2016-09-27 NOTE — Progress Notes (Addendum)
ANTIBIOTIC CONSULT NOTE-Preliminary  Pharmacy Consult for vancomycin Indication: wound infection  Allergies  Allergen Reactions  . Enalapril     Patient Measurements: Height: 5\' 6"  (167.6 cm) Weight: 152 lb (68.9 kg) IBW/kg (Calculated) : 59.3 Adjusted Body Weight:   Vital Signs: Temp: 98.2 F (36.8 C) (06/21 2159) Temp Source: Oral (06/21 2159) BP: 156/69 (06/21 2159) Pulse Rate: 75 (06/21 2159)  Labs:  Recent Labs  09/27/16 0005  WBC 9.7  HGB 10.0*  PLT 300    CrCl cannot be calculated (Patient's most recent lab result is older than the maximum 21 days allowed.).  No results for input(s): VANCOTROUGH, VANCOPEAK, VANCORANDOM, GENTTROUGH, GENTPEAK, GENTRANDOM, TOBRATROUGH, TOBRAPEAK, TOBRARND, AMIKACINPEAK, AMIKACINTROU, AMIKACIN in the last 72 hours.   Microbiology: No results found for this or any previous visit (from the past 720 hour(s)).  Medical History: Past Medical History:  Diagnosis Date  . Anxiety   . Arthritis    fingers  . 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[place])   . Diabetes mellitus without complication ([place])   . Excessive hair growth 07/19/2015  . Hyperlipidemia   . Hypertension   . Mental disorder   . Neuromuscular disorder ([place])    diabetic neuropathy  . Postmenopausal 07/19/2015    Medications:   (Not in a hospital admission) Scheduled:   Infusions:  . ciprofloxacin    . clindamycin (CLEOCIN) IV 600 mg (09/27/16 0006)  . vancomycin     PRN:  Anti-infectives    Start     Dose/Rate Route Frequency Ordered Stop   09/27/16 0030  vancomycin (VANCOCIN) IVPB 1000 mg/200 mL premix     1,000 mg 200 mL/hr over 60 Minutes Intravenous  Once 09/27/16 0017     09/26/16 2345  ciprofloxacin (CIPRO) IVPB 400 mg     400 mg 200 mL/hr over 60 Minutes Intravenous  Once 09/26/16 2342     09/26/16 2345  clindamycin (CLEOCIN) IVPB 600 mg     600 mg 100 mL/hr over 30 Minutes Intravenous  Once 09/26/16 2342        Assessment: 71 yo female  being treated for foot ulcer, starting vancomycin for wound infection.  CrCl=30.8 mL.min Goal of Therapy:  Vancomycin trough level 15-20 mcg/ml  Plan:  Preliminary review of pertinent patient information completed.  Protocol will be initiated with dose(s) of vancomycin 1000 mg IV x 1.  Forestine Na clinical pharmacist will complete review during morning rounds to assess patient and finalize treatment regimen if needed.  Nyra Capes, RPH 09/27/2016,12:28 AM

## 2016-09-27 NOTE — Care Management Obs Status (Signed)
[place] [place] NOTIFICATION   Patient Details  Name: Stacy Webb MRN: [id number] Date of Birth: 1945/05/01   Medicare Observation Status Notification Given:  Yes    Sherald Barge, RN 09/27/2016, 10:42 AM

## 2016-09-27 NOTE — Discharge Summary (Addendum)
Physician Discharge Summary  Stacy Webb EPP:[id number] DOB: February 02, 1946 DOA: 09/26/2016  PCP: [name], MD  Admit date: 09/26/2016 Discharge date: 09/27/2016  Admitted From: Home Disposition: Home  Recommendations for Outpatient Follow-up:  1. Follow up with PCP in 1 weeks 2. Follow up with Podiatry, Dr [name] or his partner in one week (Has appointment in [place] office in one week) 3. Take Bactrim and Clindamycin orally for 5 days  4. Mupirocin Topical cream to wound bed and cover with 4x4 guaze/lighty wrap with kerlix and tape    Home Health: None Equipment/Devices: None Discharge Condition: Stable CODE STATUS: Full Diet recommendation: Heart Healthy / Carb Modified   Brief/Interim Summary: 71 year old female with past medical history of diabetes type 2, hypertension, hyperlipidemia, Charcot joint came to the ER after she noticed serious discharge from her right foot diabetic ulcer. Patient states she was doing some yard work and wore rubber boots this when she thinks she might have injured her foot and since then she is noticed possible erythema in that area. She was seen by her podiatrist who placed her on Bactrim without much of an improvement. Then she noticed there may have been some discharge from that area therefore came to the ER for further evaluation. X-ray of the foot was negative for any signs of osteomyelitis but it showed possible non-displaced fracture of the great toe proximal phalanx. Wound care was consulted and the recommendations are listed above. Today patient is eager to go home as she is feeling better. No other complaints at this time. Have asked her to minimize putting weight on her right foot.  She needs to follow up outpatient with her PCP and podiatrist in one week.  Today she has reached maximum benefit from in hospital stay and is stable to be discharged with outpatient follow-up with recommendations as stated above. No other complaints at this time in no  acute events overnight.  Discharge Diagnoses:  Principal Problem:   Diabetic foot ulcer ([place]) Active Problems:   CKD (chronic kidney disease), stage III   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[name])   Hyperlipidemia   Hypertension   Diabetic ulcer of foot associated with diabetes mellitus due to underlying condition, limited to breakdown of skin ([name])  Right foot diabetic foot ulcer with concerning cellulitis -We will discontinue IV antibiotics at this time and transition over to oral Bactrim and clindamycin. No signs of osteo on XR.  -Seen by wound care this morning and recommendations noted as above -She needs to tightly control her outpatient diabetes/blood sugar  Chronic kidney disease stage III -Continue home medication follow-up outpatient with her primary care physician. Avoid nephrotoxic drugs  Hypertension -Blood pressure is relatively stable this morning. No further adjustments inpatient  Diabetes type 2 -Outpatient medications -Follow-up routinely outpatient with nephrologist and podiatrist.   Discharge Instructions   Allergies as of 09/27/2016      Reactions   Enalapril       Medication List    TAKE these medications   clindamycin 300 MG capsule Commonly known as:  CLEOCIN Take 1 capsule (300 mg total) by mouth 3 (three) times daily.   cyanocobalamin 2000 MCG tablet Take 2,000 mcg by mouth daily.   diltiazem 120 MG 12 hr capsule Commonly known as:  CARDIZEM SR Take 120 mg by mouth 2 (two) times daily.   ferrous sulfate 325 (65 FE) MG tablet Take 325 mg by mouth daily with breakfast.   gabapentin 100 MG capsule Commonly known as:  NEURONTIN Take 100 mg by mouth 2 (two) times daily.   hydrochlorothiazide 25 MG tablet Commonly known as:  HYDRODIURIL Take 25 mg by mouth daily. Takes three times a week   losartan 50 MG tablet Commonly known as:  COZAAR Take 50 mg by mouth daily.   metFORMIN 1000 MG tablet Commonly known as:  GLUCOPHAGE Take  1,000 mg by mouth 2 (two) times daily with a meal.   mupirocin cream 2 % Commonly known as:  BACTROBAN Apply topically daily. Start taking on:  09/28/2016   naproxen 500 MG tablet Commonly known as:  NAPROSYN Take 1 tablet (500 mg total) by mouth 2 (two) times daily.   oxyCODONE-acetaminophen 5-325 MG tablet Commonly known as:  PERCOCET/ROXICET Take 1 tablet by mouth every 6 (six) hours as needed for severe pain.   polyethylene glycol packet Commonly known as:  MIRALAX / GLYCOLAX Take 17 g by mouth daily.   sulfamethoxazole-trimethoprim 800-160 MG tablet Commonly known as:  BACTRIM DS,SEPTRA DS Take 1 tablet by mouth 2 (two) times daily.      Follow-up Information    [name], MD Follow up in 1 week(s).   Specialty:  Family Medicine Contact information: [address] [place] [address] [phone]          Allergies  Allergen Reactions  . Enalapril     On your next visit with your primary care physician please Get Medicines reviewed and adjusted.   Please request your Prim.MD to go over all Hospital Tests and Procedure/Radiological results at the follow up, please get all Hospital records sent to your Prim MD by signing hospital release before you go home.   If you experience worsening of your admission symptoms, develop shortness of breath, life threatening emergency, suicidal or homicidal thoughts you must seek medical attention immediately by calling 911 or calling your MD immediately  if symptoms less severe.  You Must read complete instructions/literature along with all the possible adverse reactions/side effects for all the Medicines you take and that have been prescribed to you. Take any new Medicines after you have completely understood and accpet all the possible adverse reactions/side effects.   Do not drive, operate heavy machinery, perform activities at heights, swimming or participation in water activities or provide baby sitting  services if your were admitted for syncope or siezures until you have seen by Primary MD or a Neurologist and advised to do so again.  Do not drive when taking Pain medications.    Do not take more than prescribed Pain, Sleep and Anxiety Medications  Special Instructions: If you have smoked or chewed Tobacco  in the last 2 yrs please stop smoking, stop any regular Alcohol  and or any Recreational drug use.  Wear Seat belts while driving.   Please note  You were cared for by a hospitalist during your hospital stay. If you have any questions about your discharge medications or the care you received while you were in the hospital after you are discharged, you can call the unit and asked to speak with the hospitalist on call if the hospitalist that took care of you is not available. Once you are discharged, your primary care physician will handle any further medical issues. Please note that NO REFILLS for any discharge medications will be authorized once you are discharged, as it is imperative that you return to your primary care physician (or establish a relationship with a primary care physician if you do not have one) for your  aftercare needs so that they can reassess your need for medications and monitor your lab values.   Increase activity slowly        Consultations:  Wound care   Procedures/Studies: Dg Foot Complete Right  Result Date: 09/27/2016 CLINICAL DATA:  Diabetic foot ulcer. Patient reports being treated for Charcot foot. Recent injury with "gash" to distal first metatarsal. Redness and pain. EXAM: RIGHT FOOT COMPLETE - 3+ VIEW COMPARISON:  None. FINDINGS: Chronic deformity of the midfoot with loss of normal alignment, cystic change throughout the tarsal bones and proximal metatarsals and moderate proliferative change. There are hammertoe deformities of the digits. Possible nondisplaced fracture of the great toe proximal phalanx without intra-articular extension. There  is no bony destructive change or periosteal reaction. Plantar soft tissue defect is noted subjacent to the first metatarsal phalangeal joint. Prominent plantar calcaneal spur and Achilles tendon enthesophyte. Possible plantar soft tissue edema. No radiopaque foreign body. No tracking soft tissue air. IMPRESSION: 1. Plantar soft tissue defect subjacent to the first metatarsal phalangeal joint, possible soft tissue ulcer. No radiographic findings of osteomyelitis. 2. Possible nondisplaced fracture of the great toe proximal phalanx. 3. Chronic midfoot deformity.  Hammertoe deformity of the digits. Electronically Signed   By: Jeb Levering M.D.   On: 09/27/2016 01:25       Subjective:   Discharge Exam: Vitals:   09/27/16 0309 09/27/16 0400  BP: (!) 147/69 (!) 130/59  Pulse: 68 (!) 109  Resp: 18 16  Temp: 97.6 F (36.4 C) 98.3 F (36.8 C)   Vitals:   09/26/16 2159 09/27/16 0309 09/27/16 0400  BP: (!) 156/69 (!) 147/69 (!) 130/59  Pulse: 75 68 (!) 109  Resp: 20 18 16   Temp: 98.2 F (36.8 C) 97.6 F (36.4 C) 98.3 F (36.8 C)  TempSrc: Oral Oral Oral  SpO2: 100% 94% 99%  Weight: 68.9 kg (152 lb)  67.8 kg (149 lb 7.6 oz)  Height: 5\' 6"  (1.676 m)      General: Pt is alert, awake, not in acute distress Cardiovascular: RRR, S1/S2 +, no rubs, no gallops Respiratory: CTA bilaterally, no wheezing, no rhonchi Abdominal: Soft, NT, ND, bowel sounds + Extremities: no edema, no cyanosis    The results of significant diagnostics from this hospitalization (including imaging, microbiology, ancillary and laboratory) are listed below for reference.     Microbiology: No results found for this or any previous visit (from the past 240 hour(s)).   Labs: BNP (last 3 results) No results for input(s): BNP in the last 8760 hours. Basic Metabolic Panel:  Recent Labs Lab 09/27/16 0005  NA 139  K 4.1  CL 102  CO2 24  GLUCOSE 102*  BUN 29*  CREATININE 1.59*  CALCIUM 9.5   Liver  Function Tests:  Recent Labs Lab 09/27/16 0005  AST 27  ALT 19  ALKPHOS 56  BILITOT 0.2*  PROT 7.3  ALBUMIN 3.9   No results for input(s): LIPASE, AMYLASE in the last 168 hours. No results for input(s): AMMONIA in the last 168 hours. CBC:  Recent Labs Lab 09/27/16 0005  WBC 9.7  NEUTROABS 5.4  HGB 10.0*  HCT 30.0*  MCV 82.6  PLT 300   Cardiac Enzymes: No results for input(s): CKTOTAL, CKMB, CKMBINDEX, TROPONINI in the last 168 hours. BNP: Invalid input(s): POCBNP CBG:  Recent Labs Lab 09/27/16 0748  GLUCAP 95   D-Dimer No results for input(s): DDIMER in the last 72 hours. Hgb A1c No results for input(s): HGBA1C in the  last 72 hours. Lipid Profile No results for input(s): CHOL, HDL, LDLCALC, TRIG, CHOLHDL, LDLDIRECT in the last 72 hours. Thyroid function studies No results for input(s): TSH, T4TOTAL, T3FREE, THYROIDAB in the last 72 hours.  Invalid input(s): FREET3 Anemia work up No results for input(s): VITAMINB12, FOLATE, FERRITIN, TIBC, IRON, RETICCTPCT in the last 72 hours. Urinalysis No results found for: COLORURINE, APPEARANCEUR, LABSPEC, Craig, GLUCOSEU, HGBUR, BILIRUBINUR, KETONESUR, PROTEINUR, UROBILINOGEN, NITRITE, LEUKOCYTESUR Sepsis Labs Invalid input(s): PROCALCITONIN,  WBC,  LACTICIDVEN Microbiology No results found for this or any previous visit (from the past 240 hour(s)).   Time coordinating discharge: Over 30 minutes  SIGNED:   Damita Lack, MD  [facility] 09/27/2016, 11:20 AM Pager   If 7PM-7AM, please contact night-coverage [email or web address] Password [email or web address]

## 2016-09-27 NOTE — Progress Notes (Signed)
Pharmacy Antibiotic Note  Stacy Webb is a 71 y.o. female admitted on 09/26/2016 with diabetic foot ulcer.  Pharmacy has been consulted for zosyn dosing.  Plan: Zosyn 3.375g IV q8h (4 hour infusion).  F/u renal function, cultures and clinical course  Height: 5\' 6"  (167.6 cm) Weight: 149 lb 7.6 oz (67.8 kg) IBW/kg (Calculated) : 59.3  Temp (24hrs), Avg:98 F (36.7 C), Min:97.6 F (36.4 C), Max:98.3 F (36.8 C)   Recent Labs Lab 09/27/16 0005  WBC 9.7  CREATININE 1.59*    Estimated Creatinine Clearance: 30.8 mL/min (A) (by C-G formula based on SCr of 1.59 mg/dL (H)).    Allergies  Allergen Reactions  . Enalapril      Thank you for allowing pharmacy to be a part of this patient's care.  Excell Seltzer Poteet 09/27/2016 8:20 AM

## 2016-09-27 NOTE — Progress Notes (Signed)
Patient is to be discharged home and in stable condition. Patient and family given discharge instructions and verbalize understanding of medication regimen, follow-up appointments and wound care. Patient will be escorted out by staff via wheelchair.  Celestia Khat, RN

## 2017-01-23 ENCOUNTER — Other Ambulatory Visit (HOSPITAL_COMMUNITY): Payer: Self-pay | Admitting: Family Medicine

## 2017-01-23 DIAGNOSIS — Z1231 Encounter for screening mammogram for malignant neoplasm of breast: Secondary | ICD-10-CM

## 2017-01-28 ENCOUNTER — Ambulatory Visit: Payer: Medicare Other | Admitting: Orthotics

## 2017-01-28 ENCOUNTER — Encounter: Payer: Self-pay | Admitting: Podiatry

## 2017-01-28 ENCOUNTER — Ambulatory Visit (INDEPENDENT_AMBULATORY_CARE_PROVIDER_SITE_OTHER): Payer: Medicare Other | Admitting: Podiatry

## 2017-01-28 VITALS — BP 169/82 | HR 65 | Ht 64.0 in | Wt 139.0 lb

## 2017-01-28 DIAGNOSIS — E1142 Type 2 diabetes mellitus with diabetic polyneuropathy: Secondary | ICD-10-CM

## 2017-01-28 DIAGNOSIS — E11610 Type 2 diabetes mellitus with diabetic neuropathic arthropathy: Secondary | ICD-10-CM

## 2017-01-28 DIAGNOSIS — M2042 Other hammer toe(s) (acquired), left foot: Secondary | ICD-10-CM

## 2017-01-28 DIAGNOSIS — M2041 Other hammer toe(s) (acquired), right foot: Secondary | ICD-10-CM | POA: Diagnosis not present

## 2017-01-28 NOTE — Progress Notes (Signed)
   Subjective:    Patient ID: Stacy Webb, female    DOB: [birth year], 71 y.o.   MRN: [id number]  HPI This patient presents today requesting a diabetic foot examination and replacement diabetic shoes. 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 Patient has podiatric care with Dr. Allene Pyo in [facility]. Patient is diabetic proximally 20 years with a history of recurrent ulceration and Charcot's deformity Patient denies smoking history   Review of Systems  All other systems reviewed and are negative.      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 Follow for diabetic shoes will be coordinated with orthotists

## 2017-01-28 NOTE — Progress Notes (Signed)
Patient came in today for evaluation/casting custom diabetic shoes.  She received custom shoes from [employer] in 2018, so she isn't eligible until January 2019.  We are going to go with same shoes she got last year, I called [employer] and they still have scan on file; however, we are going reduce overal length by 3/8" of an inch.  Paperwork init w/ [employer].

## 2017-01-28 NOTE — Patient Instructions (Signed)

## 2017-03-10 ENCOUNTER — Ambulatory Visit (HOSPITAL_COMMUNITY)
Admission: RE | Admit: 2017-03-10 | Discharge: 2017-03-10 | Disposition: A | Discharge status: Home / Self Care | Payer: Medicare Other | Source: Ambulatory Visit | Attending: Family Medicine | Admitting: Family Medicine

## 2017-03-10 DIAGNOSIS — Z1231 Encounter for screening mammogram for malignant neoplasm of breast: Secondary | ICD-10-CM | POA: Diagnosis not present

## 2017-05-02 ENCOUNTER — Ambulatory Visit (HOSPITAL_COMMUNITY)
Admission: RE | Admit: 2017-05-02 | Discharge: 2017-05-02 | Disposition: A | Discharge status: Home / Self Care | Payer: Medicare Other | Source: Ambulatory Visit | Attending: Family Medicine | Admitting: Family Medicine

## 2017-05-02 ENCOUNTER — Other Ambulatory Visit (HOSPITAL_COMMUNITY): Payer: Self-pay | Admitting: Family Medicine

## 2017-05-02 DIAGNOSIS — M7989 Other specified soft tissue disorders: Secondary | ICD-10-CM | POA: Insufficient documentation

## 2017-05-02 DIAGNOSIS — M79672 Pain in left foot: Secondary | ICD-10-CM | POA: Diagnosis not present

## 2017-05-02 DIAGNOSIS — R52 Pain, unspecified: Secondary | ICD-10-CM

## 2017-05-02 DIAGNOSIS — M899 Disorder of bone, unspecified: Secondary | ICD-10-CM | POA: Diagnosis not present

## 2017-05-02 DIAGNOSIS — R609 Edema, unspecified: Secondary | ICD-10-CM

## 2017-06-10 ENCOUNTER — Encounter (INDEPENDENT_AMBULATORY_CARE_PROVIDER_SITE_OTHER): Payer: Self-pay

## 2017-06-10 ENCOUNTER — Encounter: Payer: Self-pay | Admitting: Adult Health

## 2017-06-10 ENCOUNTER — Ambulatory Visit (INDEPENDENT_AMBULATORY_CARE_PROVIDER_SITE_OTHER): Payer: Medicare Other | Admitting: Adult Health

## 2017-06-10 VITALS — BP 150/70 | HR 72 | Ht 66.0 in | Wt 148.0 lb

## 2017-06-10 DIAGNOSIS — N8110 Cystocele, unspecified: Secondary | ICD-10-CM | POA: Diagnosis not present

## 2017-06-10 DIAGNOSIS — K5900 Constipation, unspecified: Secondary | ICD-10-CM | POA: Diagnosis not present

## 2017-06-10 DIAGNOSIS — N816 Rectocele: Secondary | ICD-10-CM | POA: Diagnosis not present

## 2017-06-10 DIAGNOSIS — K649 Unspecified hemorrhoids: Secondary | ICD-10-CM | POA: Insufficient documentation

## 2017-06-10 NOTE — Patient Instructions (Signed)
Constipation, Adult Constipation is when a person:  Poops (has a bowel movement) fewer times in a week than normal.  Has a hard time pooping.  Has poop that is dry, hard, or bigger than normal.  Follow these instructions at home: Eating and drinking   Eat foods that have a lot of fiber, such as: ? Fresh fruits and vegetables. ? Whole grains. ? Beans.  Eat less of foods that are high in fat, low in fiber, or overly processed, such as: ? [place] fries. ? Hamburgers. ? Cookies. ? Candy. ? Soda.  Drink enough fluid to keep your pee (urine) clear or pale yellow. General instructions  Exercise regularly or as told by your doctor.  Go to the restroom when you feel like you need to poop. Do not hold it in.  Take over-the-counter and prescription medicines only as told by your doctor. These include any fiber supplements.  Do pelvic floor retraining exercises, such as: ? Doing deep breathing while relaxing your lower belly (abdomen). ? Relaxing your pelvic floor while pooping.  Watch your condition for any changes.  Keep all follow-up visits as told by your doctor. This is important. Contact a doctor if:  You have pain that gets worse.  You have a fever.  You have not pooped for 4 days.  You throw up (vomit).  You are not hungry.  You lose weight.  You are bleeding from the anus.  You have thin, pencil-like poop (stool). Get help right away if:  You have a fever, and your symptoms suddenly get worse.  You leak poop or have blood in your poop.  Your belly feels hard or bigger than normal (is bloated).  You have very bad belly pain.  You feel dizzy or you faint. This information is not intended to replace advice given to you by your health care provider. Make sure you discuss any questions you have with your health care provider. Document Released: 09/11/2007 Document Revised: 10/13/2015 Document Reviewed: 09/13/2015 Elsevier Interactive Patient Education   2018 [employer]. About Rectocele  Overview  A rectocele is a type of hernia which causes different degrees of bulging of the rectal tissues into the vaginal wall.  You may even notice that it presses against the vaginal wall so much that some vaginal tissues droop outside of the opening of your vagina.  Causes of Rectocele  The most common cause is childbirth.  The muscles and ligaments in the pelvis that hold up and support the female organs and vagina become stretched and weakened during labor and delivery.  The more babies you have, the more the support tissues are stretched and weakened.  Not everyone who has a baby will develop a rectocele.  Some women have stronger supporting tissue in the pelvis and may not have as much of a problem as others.  Women who have a Cesarean section usually do not get rectocele's unless they pushed a long time prior to the cesarean delivery.  Other conditions that can cause a rectocele include chronic constipation, a chronic cough, a lot of heavy lifting, and obesity.  Older women may have this problem because the loss of female hormones causes the vaginal tissue to become weaker.  Symptoms  There may not be any symptoms.  If you do have symptoms, they may include:  Pelvic pressure in the rectal area  Protrusion of the lower part of the vagina through the opening of the vagina  Constipation and trapping of the stool, making it  difficult to have a bowel movement.  In severe cases, you may have to press on the lower part of your vagina to help push the stool out of you rectum.  This is called splinting to empty.  Diagnosing Rectocele  Your health care provider will ask about your symptoms and perform a pelvic exam.  S/he will ask you to bear down, pushing like you are having a bowel movement so as to see how far the lower part of the vagina protrudes into the vagina and possible outside of the vagina.  Your provider will also ask you to contract the  muscles of your pelvis (like you are stopping the stream in the middle of urinating) to determine the strength of your pelvic muscles.  Your provider may also do a rectal exam.  Treatment Options  If you do not have any symptoms, no treatment may be necessary.  Other treatment options include:  Pelvic floor exercises: Contracting the muscles in your genital area may help strengthen your muscles and support the organs.  Be sure to get proper exercise instruction from you physical therapist.  A pessary (removealbe pelvic support device) sometimes helps rectocele symptoms.  Surgery: Surgical repair may be necessary. In some cases the uterus may need to be taken out ( a hysterectomy) as well.  There are many types of surgery for pelvic support problems.  Look for physicians who specialize in repair procedures.  You can take care of yourself by:  Treating and preventing constipation  Avoiding heavy lifting, and lifting correctly (with your legs, not with you waist or back)  Treating a chronic cough or bronchitis  Not smoking  avoiding too much weight gain  Doing pelvic floor exercises   2007, [employer] Doc.33

## 2017-06-10 NOTE — Progress Notes (Signed)
Subjective:     Patient ID: Stacy Webb, female   DOB: Nov 19, 1945, 72 y.o.   MRN: [id number]  HPI Alazne is a 72 year old white female, in complaining of constipation and feels like something has dropped. She uses miralax every other day and a stool softener to go, but if does not take may go a week without BM and has to strain.   Review of Systems +constipation +hemorroids  + something bulging in vagina Reviewed past medical,surgical, social and family history. Reviewed medications and allergies.     Objective:   Physical Exam BP (!) 150/70 (BP Location: Left Arm, Patient Position: Sitting, Cuff Size: Normal)   Pulse 72   Ht 5\' 6"  (1.676 m)   Wt 148 lb (67.1 kg)   BMI 23.89 kg/m    Skin warm and dry.Pelvic: external genitalia is normal in appearance no lesions, vagina: pale and atrophic,+mild cystocele,urethra has no lesions or masses noted, cervix is smooth, uterus: normal size, shape and contour, non tender, no masses felt, adnexa: no masses or tenderness noted. Bladder is non tender and no masses felt.On rectal haw good tone, +hemorrhoids and +rectocele. Explained rectocele and showed pictures.  Assessment:     1. Rectocele   2. Cystocele with rectocele   3. Hemorrhoids, unspecified hemorrhoid type   4. Constipation, unspecified constipation type       Plan:    Use Preparation H Review handouts on constipation and rectocele, and has medical explainer #4 Try 1/2 cup real oatmeal, 1/2 cup applesauce and 1/2 cup prune juice every day    Follow up prn

## 2017-07-01 ENCOUNTER — Ambulatory Visit (INDEPENDENT_AMBULATORY_CARE_PROVIDER_SITE_OTHER): Payer: Self-pay | Admitting: Orthotics

## 2017-07-01 DIAGNOSIS — E1142 Type 2 diabetes mellitus with diabetic polyneuropathy: Secondary | ICD-10-CM

## 2017-07-01 DIAGNOSIS — M2042 Other hammer toe(s) (acquired), left foot: Secondary | ICD-10-CM | POA: Diagnosis not present

## 2017-07-01 DIAGNOSIS — M2041 Other hammer toe(s) (acquired), right foot: Secondary | ICD-10-CM

## 2017-07-01 DIAGNOSIS — E11610 Type 2 diabetes mellitus with diabetic neuropathic arthropathy: Secondary | ICD-10-CM | POA: Diagnosis not present

## 2017-07-01 NOTE — Progress Notes (Signed)
Patient came in today to pick up custom molded diabetic shoes and custom inserts.  Same was well pleased with fit and function.   The patient could ambulate without any discomfort; there were no signs of any quality issues. The foot ortheses offered full contact with plantar surface and contoured the arch well.   The shoes fit well with no heel slippage and areas of pressure concern.   Patient advised to contact us if any problems arise.  Patient also advised on how to report any issues.

## 2017-07-08 ENCOUNTER — Ambulatory Visit: Payer: Medicare Other | Admitting: Orthotics

## 2017-07-08 DIAGNOSIS — E11610 Type 2 diabetes mellitus with diabetic neuropathic arthropathy: Secondary | ICD-10-CM

## 2017-07-08 DIAGNOSIS — E1142 Type 2 diabetes mellitus with diabetic polyneuropathy: Secondary | ICD-10-CM

## 2017-07-08 NOTE — Progress Notes (Signed)
Adjusting custom shoes b/c of too much heel slippage..sending back to [facility]

## 2017-08-06 ENCOUNTER — Encounter: Payer: Self-pay | Admitting: Internal Medicine

## 2017-08-06 ENCOUNTER — Ambulatory Visit: Payer: Medicare Other | Admitting: Obstetrics and Gynecology

## 2017-09-10 ENCOUNTER — Telehealth: Payer: Self-pay | Admitting: Podiatry

## 2017-09-10 NOTE — Telephone Encounter (Signed)
Pt called checking on her custom shoes that were ordered and sent back to be corrected. [name] can you please check status and call pt with an update.

## 2017-09-12 NOTE — Telephone Encounter (Signed)
Per [name] he spoke to the owner of the company that is making her custom shoes and they are to be shipping out by next Tuesday. I have notified patient and  scheduled pt on 6.17 and will call pt if they are not in.

## 2017-09-15 ENCOUNTER — Ambulatory Visit (INDEPENDENT_AMBULATORY_CARE_PROVIDER_SITE_OTHER): Payer: Medicare Other | Admitting: Internal Medicine

## 2017-09-15 ENCOUNTER — Encounter (INDEPENDENT_AMBULATORY_CARE_PROVIDER_SITE_OTHER): Payer: Self-pay | Admitting: Internal Medicine

## 2017-09-15 VITALS — BP 108/90 | HR 60 | Temp 97.9°F | Ht 65.0 in | Wt 147.1 lb

## 2017-09-15 DIAGNOSIS — D649 Anemia, unspecified: Secondary | ICD-10-CM | POA: Diagnosis not present

## 2017-09-15 NOTE — Progress Notes (Addendum)
Subjective:    Patient ID: Stacy Webb, female    DOB: [birth year], 72 y.o.   MRN: [id number]  HPI Referred by Dr. Karie Kirks for anemia.  Noted on recent CBC (08/06/2017) that H and H 10.o and 30.6. MCV 82. Patient has long hx of anemia dating back to at least 2014. Her last colonoscopy was in May of 2014 and was incomplete. Incomplete exam the hepatic flexure. Multiple diverticula at sigmoid colon and a few more descending and transverse colon. Unusual loop in sigmoid colon prevented completion of exam.  She tells me 3 stool cards were negative in April.\ Her appetite is good. No weight loss. BMs are normal. No melena or BRRB. She takes a stool softener every night . She has constipation. She tells me she has rectocele.  08/06/2017 Iron 55, Ferritin 79, Vitamin B12 602. Folate 19.8  Family hx colon cancer in an aunt in her 64s.    09/11/2013 DG Colon w/air: Findings: Scout view of the abdomen shows a normal bowel gas pattern.  A calcified lesion in the right abdomen measures 2.2 x 2.4 cm and may represent a gallstone.  An air-contrast enema was attempted.  The patient had extreme difficulty retaining fluid and air, somewhat limiting evaluation. In addition, there is significant overlap of a tortuous sigmoid colon.  Numerous diverticula.  No obvious stricture or mass.  IMPRESSION:  1.  Study was technically difficult for reasons given above, with somewhat limited evaluation of the colon as a result. 2.  Numerous diverticula. 3.  No large polyp, stricture or mass. 4.  Possible gallstone.    CBC Latest Ref Rng & Units 09/27/2016 04/16/2012  WBC 4.0 - 10.5 K/uL 9.7 9.0  Hemoglobin 12.0 - 15.0 g/dL 10.0(L) 10.9(L)  Hematocrit 36.0 - 46.0 % 30.0(L) 32.2(L)  Platelets 150 - 400 K/uL 300 276  09/27/2016 H and H 10.0 and 30.0 09/02/2016 H and H 11.4 and 34.2.    Review of Systems Past Medical History:  Diagnosis Date  . Anemia   . Anxiety   . Arthritis    fingers  . Charcot foot  due to diabetes mellitus ([place])   . Diabetes mellitus without complication ([place])   . Excessive hair growth 07/19/2015  . Heart murmur   . History of kidney stones   . Hyperlipidemia   . Hypertension   . Neuromuscular disorder ([place])    diabetic neuropathy  . Neuropathy   . Postmenopausal 07/19/2015    Past Surgical History:  Procedure Laterality Date  . ANTERIOR AND POSTERIOR REPAIR N/A 04/28/2018   Procedure: ANTERIOR (CYSTOCELE) AND POSTERIOR REPAIR (RECTOCELE);  Surgeon: Jonnie Kind, MD;  Location: [facility];  Service: Gynecology;  Laterality: N/A;  . APPENDECTOMY    . CARPAL TUNNEL RELEASE Bilateral   . CESAREAN SECTION    . COLONOSCOPY N/A 08/28/2012   Procedure: COLONOSCOPY;  Surgeon: Rogene Houston, MD;  Location: [facility] SU[facility];  Service: Endoscopy;  Laterality: N/A;  915  . COLONOSCOPY N/A 12/25/2017   Procedure: COLONOSCOPY;  Surgeon: Rogene Houston, MD;  Location: [facility];  Service: Endoscopy;  Laterality: N/A;  12:45    Allergies  Allergen Reactions  . Clindamycin/Lincomycin Other (See Comments)    Mouth taste like metal  . Enalapril Other (See Comments) and Cough    Constant Cough    Current Outpatient Medications on File Prior to Visit  Medication Sig Dispense Refill  . atorvastatin (LIPITOR) 20 MG tablet Take 20 mg by mouth  daily.    . ferrous sulfate 325 (65 FE) MG tablet Take 325 mg by mouth daily with breakfast.     . gabapentin (NEURONTIN) 100 MG capsule Take 100 mg by mouth 2 (two) times daily.     Marland Kitchen losartan (COZAAR) 50 MG tablet Take 50 mg by mouth daily.    . metFORMIN (GLUCOPHAGE) 1000 MG tablet Take 1,000 mg by mouth 2 (two) times daily with a meal.    . vitamin B-12 (CYANOCOBALAMIN) 500 MCG tablet Take 500 mcg by mouth 2 (two) times daily.      No current facility-administered medications on file prior to visit.         Allergies  Allergen Reactions  . Clindamycin/Lincomycin     Mouth taste like metal  . Enalapril             Objective:   Physical Exam  Blood pressure 108/90, pulse 60, temperature 97.9 F (36.6 C), height 5\' 5"  (1.651 m), weight 147 lb 1.6 oz (66.7 kg). Alert and oriented. Skin warm and dry. Oral mucosa is moist.   . Sclera anicteric, conjunctivae is pink. Thyroid not enlarged. No cervical lymphadenopathy. Lungs clear. Heart regular rate and rhythm.  Abdomen is soft. Bowel sounds are positive. No hepatomegaly. No abdominal masses felt. No tenderness.  No edema to lower extremities.            Assessment & Plan:  Anemia. Am going to get iron studies. 3 stool cards home with patient.  Further recommendations to follow.

## 2017-09-15 NOTE — Patient Instructions (Signed)
Lab studies.

## 2017-09-16 LAB — IRON, TOTAL/TOTAL IRON BINDING CAP
%SAT: 10 % — AB (ref 11–50)
Iron: 34 ug/dL — ABNORMAL LOW (ref 45–160)
TIBC: 345 mcg/dL (calc) (ref 250–450)

## 2017-09-16 LAB — FOLATE: Folate: 15.3 ng/mL

## 2017-09-16 LAB — FERRITIN: FERRITIN: 52 ng/mL (ref 20–288)

## 2017-09-16 LAB — VITAMIN B12: Vitamin B-12: 791 pg/mL (ref 200–1100)

## 2017-09-19 ENCOUNTER — Other Ambulatory Visit (INDEPENDENT_AMBULATORY_CARE_PROVIDER_SITE_OTHER): Payer: Self-pay | Admitting: *Deleted

## 2017-09-19 ENCOUNTER — Telehealth: Payer: Self-pay | Admitting: Podiatry

## 2017-09-19 DIAGNOSIS — D649 Anemia, unspecified: Secondary | ICD-10-CM

## 2017-09-19 NOTE — Telephone Encounter (Signed)
Left message on home and cell number that appt needs to be r/s shoes are not in and to call me Monday morning and to not come in.  Per [name] should arrive Monday with ups but we are not sure what time ups will come.

## 2017-09-22 ENCOUNTER — Ambulatory Visit: Payer: Medicare Other | Admitting: Orthotics

## 2017-09-22 ENCOUNTER — Other Ambulatory Visit: Payer: Medicare Other | Admitting: Orthotics

## 2017-09-22 DIAGNOSIS — M2042 Other hammer toe(s) (acquired), left foot: Secondary | ICD-10-CM

## 2017-09-22 DIAGNOSIS — E1142 Type 2 diabetes mellitus with diabetic polyneuropathy: Secondary | ICD-10-CM

## 2017-09-22 DIAGNOSIS — M2041 Other hammer toe(s) (acquired), right foot: Secondary | ICD-10-CM

## 2017-09-22 DIAGNOSIS — E11610 Type 2 diabetes mellitus with diabetic neuropathic arthropathy: Secondary | ICD-10-CM

## 2017-09-22 NOTE — Progress Notes (Signed)
Patient p/u corrected diabetic shoes.

## 2017-09-22 NOTE — Telephone Encounter (Signed)
Spoke to pt and she is aware shoes are not in and appt is cxled and would like me to call to r/s when shoes arrive.

## 2017-09-29 ENCOUNTER — Encounter (INDEPENDENT_AMBULATORY_CARE_PROVIDER_SITE_OTHER): Payer: Self-pay | Admitting: *Deleted

## 2017-09-29 ENCOUNTER — Other Ambulatory Visit (INDEPENDENT_AMBULATORY_CARE_PROVIDER_SITE_OTHER): Payer: Self-pay | Admitting: *Deleted

## 2017-09-29 DIAGNOSIS — D649 Anemia, unspecified: Secondary | ICD-10-CM

## 2017-10-02 ENCOUNTER — Ambulatory Visit: Payer: Medicare Other | Admitting: Podiatry

## 2017-10-24 LAB — HEMOGLOBIN AND HEMATOCRIT, BLOOD
HEMATOCRIT: 29.5 % — AB (ref 35.0–45.0)
HEMOGLOBIN: 9.6 g/dL — AB (ref 11.7–15.5)

## 2017-10-24 LAB — FERRITIN: FERRITIN: 90 ng/mL (ref 16–288)

## 2017-10-24 LAB — IRON: Iron: 59 ug/dL (ref 45–160)

## 2017-10-29 ENCOUNTER — Other Ambulatory Visit (INDEPENDENT_AMBULATORY_CARE_PROVIDER_SITE_OTHER): Payer: Self-pay | Admitting: Internal Medicine

## 2017-10-29 ENCOUNTER — Telehealth (INDEPENDENT_AMBULATORY_CARE_PROVIDER_SITE_OTHER): Payer: Self-pay | Admitting: Internal Medicine

## 2017-10-29 DIAGNOSIS — Z800 Family history of malignant neoplasm of digestive organs: Secondary | ICD-10-CM

## 2017-10-29 DIAGNOSIS — D508 Other iron deficiency anemias: Secondary | ICD-10-CM

## 2017-10-29 NOTE — Telephone Encounter (Signed)
err

## 2017-10-30 DIAGNOSIS — D649 Anemia, unspecified: Secondary | ICD-10-CM | POA: Insufficient documentation

## 2017-11-12 ENCOUNTER — Telehealth (INDEPENDENT_AMBULATORY_CARE_PROVIDER_SITE_OTHER): Payer: Self-pay | Admitting: *Deleted

## 2017-11-12 ENCOUNTER — Encounter (INDEPENDENT_AMBULATORY_CARE_PROVIDER_SITE_OTHER): Payer: Self-pay | Admitting: *Deleted

## 2017-11-12 MED ORDER — SUPREP BOWEL PREP KIT 17.5-3.13-1.6 GM/177ML PO SOLN: 1.0000 | Freq: Once | ORAL | 0 refills | Status: AC

## 2017-11-12 NOTE — Telephone Encounter (Signed)
Patient needs suprep 

## 2017-12-25 ENCOUNTER — Ambulatory Visit (HOSPITAL_COMMUNITY)
Admission: RE | Admit: 2017-12-25 | Discharge: 2017-12-25 | Disposition: A | Discharge status: Home / Self Care | Payer: Medicare Other | Source: Ambulatory Visit | Attending: Internal Medicine | Admitting: Internal Medicine

## 2017-12-25 ENCOUNTER — Other Ambulatory Visit: Payer: Self-pay

## 2017-12-25 ENCOUNTER — Encounter (HOSPITAL_COMMUNITY)
Admission: RE | Disposition: A | Discharge status: Home / Self Care | Payer: Self-pay | Source: Ambulatory Visit | Attending: Internal Medicine

## 2017-12-25 ENCOUNTER — Encounter (HOSPITAL_COMMUNITY): Payer: Self-pay | Admitting: *Deleted

## 2017-12-25 DIAGNOSIS — Z881 Allergy status to other antibiotic agents status: Secondary | ICD-10-CM | POA: Diagnosis not present

## 2017-12-25 DIAGNOSIS — D649 Anemia, unspecified: Secondary | ICD-10-CM

## 2017-12-25 DIAGNOSIS — Z7984 Long term (current) use of oral hypoglycemic drugs: Secondary | ICD-10-CM | POA: Diagnosis not present

## 2017-12-25 DIAGNOSIS — F419 Anxiety disorder, unspecified: Secondary | ICD-10-CM | POA: Insufficient documentation

## 2017-12-25 DIAGNOSIS — D509 Iron deficiency anemia, unspecified: Secondary | ICD-10-CM | POA: Diagnosis present

## 2017-12-25 DIAGNOSIS — K5730 Diverticulosis of large intestine without perforation or abscess without bleeding: Secondary | ICD-10-CM

## 2017-12-25 DIAGNOSIS — N183 Chronic kidney disease, stage 3 unspecified: Secondary | ICD-10-CM

## 2017-12-25 DIAGNOSIS — M1990 Unspecified osteoarthritis, unspecified site: Secondary | ICD-10-CM | POA: Diagnosis not present

## 2017-12-25 DIAGNOSIS — I10 Essential (primary) hypertension: Secondary | ICD-10-CM | POA: Diagnosis not present

## 2017-12-25 DIAGNOSIS — Z800 Family history of malignant neoplasm of digestive organs: Secondary | ICD-10-CM

## 2017-12-25 DIAGNOSIS — E785 Hyperlipidemia, unspecified: Secondary | ICD-10-CM | POA: Insufficient documentation

## 2017-12-25 DIAGNOSIS — K644 Residual hemorrhoidal skin tags: Secondary | ICD-10-CM | POA: Insufficient documentation

## 2017-12-25 DIAGNOSIS — E1140 Type 2 diabetes mellitus with diabetic neuropathy, unspecified: Secondary | ICD-10-CM | POA: Insufficient documentation

## 2017-12-25 DIAGNOSIS — Z79899 Other long term (current) drug therapy: Secondary | ICD-10-CM | POA: Insufficient documentation

## 2017-12-25 DIAGNOSIS — D508 Other iron deficiency anemias: Secondary | ICD-10-CM

## 2017-12-25 HISTORY — PX: COLONOSCOPY: SHX5424

## 2017-12-25 LAB — HEMOGLOBIN AND HEMATOCRIT, BLOOD
HEMATOCRIT: 28.9 % — AB (ref 36.0–46.0)
Hemoglobin: 9.4 g/dL — ABNORMAL LOW (ref 12.0–15.0)

## 2017-12-25 LAB — GLUCOSE, CAPILLARY: GLUCOSE-CAPILLARY: 100 mg/dL — AB (ref 70–99)

## 2017-12-25 LAB — CREATININE, SERUM
Creatinine, Ser: 1.39 mg/dL — ABNORMAL HIGH (ref 0.44–1.00)
GFR calc Af Amer: 43 mL/min — ABNORMAL LOW (ref >60)
GFR calc non Af Amer: 37 mL/min — ABNORMAL LOW (ref >60)

## 2017-12-25 SURGERY — COLONOSCOPY
Anesthesia: Moderate Sedation

## 2017-12-25 MED ORDER — MIDAZOLAM HCL 5 MG/5ML IJ SOLN
INTRAMUSCULAR | Status: AC
  Filled 2017-12-25: qty 10

## 2017-12-25 MED ORDER — SODIUM CHLORIDE 0.9 % IV SOLN
INTRAVENOUS | Status: DC
  Administered 2017-12-25: 12:00:00 via INTRAVENOUS

## 2017-12-25 MED ORDER — MIDAZOLAM HCL 5 MG/5ML IJ SOLN
INTRAMUSCULAR | Status: DC | PRN
  Administered 2017-12-25 (×3): 2 mg via INTRAVENOUS

## 2017-12-25 MED ORDER — MEPERIDINE HCL 50 MG/ML IJ SOLN
INTRAMUSCULAR | Status: AC
  Filled 2017-12-25: qty 1

## 2017-12-25 MED ORDER — STERILE WATER FOR IRRIGATION IR SOLN
Status: DC | PRN
  Administered 2017-12-25: 13:00:00

## 2017-12-25 MED ORDER — MEPERIDINE HCL 50 MG/ML IJ SOLN
INTRAMUSCULAR | Status: DC | PRN
  Administered 2017-12-25 (×2): 25 mg via INTRAVENOUS

## 2017-12-25 NOTE — H&P (Addendum)
Stacy Webb is an 72 y.o. female.   Chief Complaint: Patient is here for colonoscopy. HPI: 72 year old Caucasian female who has a history of iron deficiency anemia.  She underwent colonoscopy in 2014 and was incomplete exam to hepatic flexure.  She subsequent had a barium enema no other abnormality noted other than diverticulosis.  She denies melena or rectal bleeding.  Family history significant for CRC in paternal aunt in her late 19s or 75s.  Patient does not take OTC NSAIDs.  Active Ambulatory Problems    Diagnosis Date Noted  . Essential hypertension, benign 08/11/2012  . High cholesterol 08/11/2012  . Family hx of colon cancer 08/11/2012  . Diabetes ([place]) 08/11/2012  . Postmenopausal 07/19/2015  . Diabetic foot ulcer ([place]) 09/27/2016  . CKD (chronic kidney disease), stage III ([place]) 09/27/2016  . 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[place]) 09/27/2016  . Hyperlipidemia 09/27/2016  . Hypertension 09/27/2016  . Diabetic ulcer of foot associated with diabetes mellitus due to underlying condition, limited to breakdown of skin ([place]) 09/27/2016  . Constipation 06/10/2017  . Hemorrhoids 06/10/2017  . Cystocele with rectocele 06/10/2017  . Rectocele 06/10/2017   Resolved Ambulatory Problems    Diagnosis Date Noted  . No Resolved Ambulatory Problems   Past Medical History:  Diagnosis Date  . Anemia   . Anxiety   . Arthritis   . Diabetes mellitus without complication ([place])   . Excessive hair growth 07/19/2015  . Heart murmur   . History of kidney stones   . Neuromuscular disorder ([place])   . Neuropathy     Past Surgical History:  Procedure Laterality Date  . APPENDECTOMY    . CARPAL TUNNEL RELEASE    . CESAREAN SECTION    . COLONOSCOPY N/A 08/28/2012   Procedure: COLONOSCOPY;  Surgeon: Rogene Houston, MD;  Location: [facility];  Service: Endoscopy;  Laterality: N/A;  4    Family History  Problem Relation Age of Onset  . Suicidality Son   . Diabetes Daughter   .  Colon cancer Paternal Aunt    Social History:  reports that she has never smoked. She has never used smokeless tobacco. She reports that she does not drink alcohol or use drugs.  Allergies:  Allergies  Allergen Reactions  . Clindamycin/Lincomycin     Mouth taste like metal  . Enalapril Other (See Comments)    Constant Cough    Medications Prior to Admission  Medication Sig Dispense Refill  . atorvastatin (LIPITOR) 20 MG tablet Take 20 mg by mouth daily.    . cyanocobalamin 2000 MCG tablet Take 1,000 mcg by mouth 2 (two) times daily.     Marland Kitchen diltiazem (CARDIZEM) 120 MG tablet Take 120 mg by mouth 2 (two) times daily.    . ferrous sulfate 325 (65 FE) MG tablet Take 325 mg by mouth 2 (two) times daily.     Marland Kitchen gabapentin (NEURONTIN) 100 MG capsule Take 100 mg by mouth 2 (two) times daily.     . hydrochlorothiazide (HYDRODIURIL) 50 MG tablet Take 25 mg by mouth daily.     Marland Kitchen losartan (COZAAR) 50 MG tablet Take 50 mg by mouth daily.    . metFORMIN (GLUCOPHAGE) 1000 MG tablet Take 1,000 mg by mouth 2 (two) times daily with a meal.      Results for orders placed or performed during the hospital encounter of 12/25/17 (from the past 48 hour(s))  Glucose, capillary     Status: Abnormal   Collection Time:  12/25/17 12:15 PM  Result Value Ref Range   Glucose-Capillary 100 (H) 70 - 99 mg/dL   No results found.  ROS  Blood pressure (!) 157/107, pulse 71, temperature (!) 97.5 F (36.4 C), temperature source Oral, resp. rate (!) 33, height 5\' 5"  (1.651 m), weight 64.4 kg, SpO2 100 %. Physical Exam  Constitutional: She appears well-developed and well-nourished.  HENT:  Mouth/Throat: Oropharynx is clear and moist.  Eyes: Conjunctivae are normal. No scleral icterus.  Neck: No thyromegaly present.  Cardiovascular: Normal rate, regular rhythm and normal heart sounds.  No murmur heard. Respiratory: Effort normal and breath sounds normal.  GI:  Lower midline scar. Abdomen is  soft nontender with  organomegaly or masses.  Musculoskeletal: She exhibits no edema.  Lymphadenopathy:    She has no cervical adenopathy.  Neurological: She is alert.  Skin: Skin is warm and dry.     Assessment/Plan History of iron deficiency anemia. Last colonoscopy was incomplete. Diagnostic colonoscopy.  Hildred Laser, MD 12/25/2017, 12:59 PM

## 2017-12-25 NOTE — Discharge Instructions (Signed)
Colonoscopy, Adult, Care After This sheet gives you information about how to care for yourself after your procedure. Your health care provider may also give you more specific instructions. If you have problems or questions, contact your health care provider. What can I expect after the procedure? After the procedure, it is common to have:  A small amount of blood in your stool for 24 hours after the procedure.  Some gas.  Mild abdominal cramping or bloating.  Follow these instructions at home: General instructions   For the first 24 hours after the procedure: ? Do not drive or use machinery. ? Do not sign important documents. ? Do not drink alcohol. ? Do your regular daily activities at a slower pace than normal. ? Eat soft, easy-to-digest foods. ? Rest often.  Take over-the-counter or prescription medicines only as told by your health care provider.  It is up to you to get the results of your procedure. Ask your health care provider, or the department performing the procedure, when your results will be ready. Relieving cramping and bloating  Try walking around when you have cramps or feel bloated.  Apply heat to your abdomen as told by your health care provider. Use a heat source that your health care provider recommends, such as a moist heat pack or a heating pad. ? Place a towel between your skin and the heat source. ? Leave the heat on for 20-30 minutes. ? Remove the heat if your skin turns bright red. This is especially important if you are unable to feel pain, heat, or cold. You may have a greater risk of getting burned. Eating and drinking  Drink enough fluid to keep your urine clear or pale yellow.  Resume your normal diet as instructed by your health care provider. Avoid heavy or fried foods that are hard to digest.  Avoid drinking alcohol for as long as instructed by your health care provider. Contact a health care provider if:  You have blood in your stool 2-3  days after the procedure. Get help right away if:  You have more than a small spotting of blood in your stool.  You pass large blood clots in your stool.  Your abdomen is swollen.  You have nausea or vomiting.  You have a fever.  You have increasing abdominal pain that is not relieved with medicine. This information is not intended to replace advice given to you by your health care provider. Make sure you discuss any questions you have with your health care provider. Document Released: 11/07/2003 Document Revised: 12/18/2015 Document Reviewed: 06/06/2015 Elsevier Interactive Patient Education  2018 [employer]. Hemorrhoids Hemorrhoids are swollen veins in and around the rectum or anus. Hemorrhoids can cause pain, itching, or bleeding. Most of the time, they do not cause serious problems. They usually get better with diet changes, lifestyle changes, and other home treatments. Follow these instructions at home: Eating and drinking  Eat foods that have fiber, such as whole grains, beans, nuts, fruits, and vegetables. Ask your doctor about taking products that have added fiber (fibersupplements).  Drink enough fluid to keep your pee (urine) clear or pale yellow. For Pain and Swelling  Take a warm-water bath (sitz bath) for 20 minutes to ease pain. Do this 3-4 times a day.  If directed, put ice on the painful area. It may be helpful to use ice between your warm baths. ? Put ice in a plastic bag. ? Place a towel between your skin and the bag. ?  Leave the ice on for 20 minutes, 2-3 times a day. General instructions  Take over-the-counter and prescription medicines only as told by your doctor. ? Medicated creams and medicines that are inserted into the anus (suppositories) may be used or applied as told.  Exercise often.  Go to the bathroom when you have the urge to poop (to have a bowel movement). Do not wait.  Avoid pushing too hard (straining) when you poop.  Keep the butt  area dry and clean. Use wet toilet paper or moist paper towels.  Do not sit on the toilet for a long time. Contact a doctor if:  You have any of these: ? Pain and swelling that do not get better with treatment or medicine. ? Bleeding that will not stop. ? Trouble pooping or you cannot poop. ? Pain or swelling outside the area of the hemorrhoids. This information is not intended to replace advice given to you by your health care provider. Make sure you discuss any questions you have with your health care provider. Document Released: 01/02/2008 Document Revised: 08/31/2015 Document Reviewed: 12/07/2014 Elsevier Interactive Patient Education  2018 [employer]. Diverticulosis Diverticulosis is a condition that develops when small pouches (diverticula) form in the wall of the large intestine (colon). The colon is where water is absorbed and stool is formed. The pouches form when the inside layer of the colon pushes through weak spots in the outer layers of the colon. You may have a few pouches or many of them. What are the causes? The cause of this condition is not known. What increases the risk? The following factors may make you more likely to develop this condition:  Being older than age 58. Your risk for this condition increases with age. Diverticulosis is rare among people younger than age 59. By age 62, many people have it.  Eating a low-fiber diet.  Having frequent constipation.  Being overweight.  Not getting enough exercise.  Smoking.  Taking over-the-counter pain medicines, like aspirin and ibuprofen.  Having a family history of diverticulosis.  What are the signs or symptoms? In most people, there are no symptoms of this condition. If you do have symptoms, they may include:  Bloating.  Cramps in the abdomen.  Constipation or diarrhea.  Pain in the lower left side of the abdomen.  How is this diagnosed? This condition is most often diagnosed during an exam for  other colon problems. Because diverticulosis usually has no symptoms, it often cannot be diagnosed independently. This condition may be diagnosed by:  Using a flexible scope to examine the colon (colonoscopy).  Taking an X-ray of the colon after dye has been put into the colon (barium enema).  Doing a CT scan.  How is this treated? You may not need treatment for this condition if you have never developed an infection related to diverticulosis. If you have had an infection before, treatment may include:  Eating a high-fiber diet. This may include eating more fruits, vegetables, and grains.  Taking a fiber supplement.  Taking a live bacteria supplement (probiotic).  Taking medicine to relax your colon.  Taking antibiotic medicines.  Follow these instructions at home:  Drink 6-8 glasses of water or more each day to prevent constipation.  Try not to strain when you have a bowel movement.  If you have had an infection before: ? Eat more fiber as directed by your health care provider or your diet and nutrition specialist (dietitian). ? Take a fiber supplement or probiotic, if  your health care provider approves.  Take over-the-counter and prescription medicines only as told by your health care provider.  If you were prescribed an antibiotic, take it as told by your health care provider. Do not stop taking the antibiotic even if you start to feel better.  Keep all follow-up visits as told by your health care provider. This is important. Contact a health care provider if:  You have pain in your abdomen.  You have bloating.  You have cramps.  You have not had a bowel movement in 3 days. Get help right away if:  Your pain gets worse.  Your bloating becomes very bad.  You have a fever or chills, and your symptoms suddenly get worse.  You vomit.  You have bowel movements that are bloody or black.  You have bleeding from your rectum. Summary  Diverticulosis is a  condition that develops when small pouches (diverticula) form in the wall of the large intestine (colon).  You may have a few pouches or many of them.  This condition is most often diagnosed during an exam for other colon problems.  If you have had an infection related to diverticulosis, treatment may include increasing the fiber in your diet, taking supplements, or taking medicines. This information is not intended to replace advice given to you by your health care provider. Make sure you discuss any questions you have with your health care provider. Document Released: 12/21/2003 Document Revised: 02/12/2016 Document Reviewed: 02/12/2016 Elsevier Interactive Patient Education  2017 [place] usual medications as before. Modified carb high-fiber diet. No driving for 24 hours. Physician will call with biopsy results.

## 2017-12-25 NOTE — Op Note (Signed)
[facility] Patient Name: Stacy Webb Procedure Date: 12/25/2017 12:40 PM MRN: [id number] Date of Birth: 03-Mar-1946 Attending MD: Hildred Laser , MD CSN: [id number] Age: 72 Admit Type: Outpatient Procedure:                Colonoscopy Indications:              Unexplained iron deficiency anemia Providers:                Hildred Laser, MD, Otis Peak B. Sharon Seller, RN, Randa Spike, Technician Referring MD:             [name], MD Medicines:                Meperidine 50 mg IV, Midazolam 6 mg IV Complications:            No immediate complications. Estimated Blood Loss:     Estimated blood loss: none. Procedure:                Pre-Anesthesia Assessment:                           - Prior to the procedure, a History and Physical                            was performed, and patient medications and                            allergies were reviewed. The patient's tolerance of                            previous anesthesia was also reviewed. The risks                            and benefits of the procedure and the sedation                            options and risks were discussed with the patient.                            All questions were answered, and informed consent                            was obtained. Prior Anticoagulants: The patient has                            taken no previous anticoagulant or antiplatelet                            agents. ASA Grade Assessment: III - A patient with                            severe systemic disease. After reviewing the risks  and benefits, the patient was deemed in                            satisfactory condition to undergo the procedure.                           After obtaining informed consent, the colonoscope                            was passed under direct vision. Throughout the                            procedure, the patient's blood pressure, pulse, and                   oxygen saturations were monitored continuously. The                            [id number]-[id number] ([id number]) scope was introduced through                            the anus and advanced to the the cecum, identified                            by the ileocecal valve. The colonoscopy was                            technically difficult and complex due to                            significant looping. Successful completion of the                            procedure was aided by increasing the dose of                            sedation medication, changing scope to ultra slim                            scope and scope guide use. The patient tolerated                            the procedure fairly well. The quality of the bowel                            preparation was excellent except the cecum was                            fair. The ileocecal valve and the rectum were                            photographed. The ileocecal valve, the appendiceal  orifice and the rectum were photographed. Scope In: 1:11:03 PM Scope Out: 1:53:19 PM Scope Withdrawal Time: 0 hours 7 minutes 37 seconds  Total Procedure Duration: 0 hours 42 minutes 16 seconds  Findings:      Skin tags were found on perianal exam.      Multiple small and large-mouthed diverticula were found in the entire       colon.      External hemorrhoids were found during retroflexion. The hemorrhoids       were small. Impression:               - Perianal skin tags found on perianal exam.                           - Diverticulosis in the entire examined colon.                           - External hemorrhoids.                           - No specimens collected. Moderate Sedation:      Moderate (conscious) sedation was administered by the endoscopy nurse       and supervised by the endoscopist. The following parameters were       monitored: oxygen saturation, heart rate, blood pressure, CO2        capnography and response to care. Total physician intraservice time was       42 minutes. Recommendation:           - Patient has a contact number available for                            emergencies. The signs and symptoms of potential                            delayed complications were discussed with the                            patient. Return to normal activities tomorrow.                            Written discharge instructions were provided to the                            patient.                           - High fiber diet and diabetic (ADA) diet today.                           - Continue present medications.                           - H/H today alongwith serum creatinine.                           - No repeat colonoscopy due to age and current age                            (  10 years or older). Procedure Code(s):        --- Professional ---                           [phone], Colonoscopy, flexible; diagnostic, including                            collection of specimen(s) by brushing or washing,                            when performed (separate procedure)                           G0500, Moderate sedation services provided by the                            same physician or other qualified health care                            professional performing a gastrointestinal                            endoscopic service that sedation supports,                            requiring the presence of an independent trained                            observer to assist in the monitoring of the                            patient's level of consciousness and physiological                            status; initial 15 minutes of intra-service time;                            patient age 71 years or older (additional time may                            be reported with [phone], as appropriate)                           [phone], Moderate sedation services provided by the                             same physician or other qualified health care                            professional performing the diagnostic or                            therapeutic service that the sedation supports,  requiring the presence of an independent trained                            observer to assist in the monitoring of the                            patient's level of consciousness and physiological                            status; each additional 15 minutes intraservice                            time (List separately in addition to code for                            primary service)                           [phone], Moderate sedation services provided by the                            same physician or other qualified health care                            professional performing the diagnostic or                            therapeutic service that the sedation supports,                            requiring the presence of an independent trained                            observer to assist in the monitoring of the                            patient's level of consciousness and physiological                            status; each additional 15 minutes intraservice                            time (List separately in addition to code for                            primary service) Diagnosis Code(s):        --- Professional ---                           K64.4, Residual hemorrhoidal skin tags                           D50.9, Iron deficiency anemia, unspecified  K57.30, Diverticulosis of large intestine without                            perforation or abscess without bleeding CPT copyright 2017 American [employer]. All rights reserved. The codes documented in this report are preliminary and upon coder review may  be revised to meet current compliance requirements. Hildred Laser, MD Hildred Laser, MD 12/25/2017 2:05:01 PM This report has  been signed electronically. Number of Addenda: 0

## 2017-12-30 ENCOUNTER — Encounter (HOSPITAL_COMMUNITY): Payer: Self-pay | Admitting: Internal Medicine

## 2018-01-06 ENCOUNTER — Telehealth (INDEPENDENT_AMBULATORY_CARE_PROVIDER_SITE_OTHER): Payer: Self-pay | Admitting: *Deleted

## 2018-01-06 NOTE — Telephone Encounter (Signed)
   Diagnosis:    Result(s)   Card 1:Negative:                          Stools were brown in color.    Card 2: Negative:                           Card 3: Negative:    Completed by: Thomas Hoff ,LPN   HEMOCCULT SENSA DEVELOPER: LOT#:  [id number] EXPIRATION DATE: 2021-11   HEMOCCULT SENSA CARD:  LOT#:  [id number] EXPIRATION DATE: 08/21   CARD CONTROL RESULTS:  POSITIVE: Positive NEGATIVE: Negative    ADDITIONAL COMMENTS: Results were forwarded to the ordering physician.

## 2018-01-07 NOTE — Telephone Encounter (Signed)
Results left on answering machine 

## 2018-02-09 ENCOUNTER — Other Ambulatory Visit (HOSPITAL_COMMUNITY): Payer: Self-pay | Admitting: Nurse Practitioner

## 2018-02-09 ENCOUNTER — Ambulatory Visit (HOSPITAL_COMMUNITY)
Admission: RE | Admit: 2018-02-09 | Discharge: 2018-02-09 | Disposition: A | Discharge status: Home / Self Care | Payer: Medicare Other | Source: Ambulatory Visit | Attending: Nurse Practitioner | Admitting: Nurse Practitioner

## 2018-02-09 DIAGNOSIS — M25561 Pain in right knee: Secondary | ICD-10-CM

## 2018-02-09 DIAGNOSIS — M1711 Unilateral primary osteoarthritis, right knee: Secondary | ICD-10-CM | POA: Diagnosis not present

## 2018-02-11 ENCOUNTER — Encounter: Payer: Self-pay | Admitting: Orthopedic Surgery

## 2018-02-11 ENCOUNTER — Ambulatory Visit (INDEPENDENT_AMBULATORY_CARE_PROVIDER_SITE_OTHER): Payer: Medicare Other

## 2018-02-11 ENCOUNTER — Ambulatory Visit (INDEPENDENT_AMBULATORY_CARE_PROVIDER_SITE_OTHER): Payer: Medicare Other | Admitting: Orthopedic Surgery

## 2018-02-11 VITALS — BP 149/72 | HR 68 | Ht 65.0 in | Wt 148.0 lb

## 2018-02-11 DIAGNOSIS — G8929 Other chronic pain: Secondary | ICD-10-CM | POA: Diagnosis not present

## 2018-02-11 DIAGNOSIS — M25512 Pain in left shoulder: Secondary | ICD-10-CM | POA: Diagnosis not present

## 2018-02-11 NOTE — Progress Notes (Signed)
Patient ID: Stacy Webb, female   DOB: [birth year], 72 y.o.   MRN: [id number]  Chief Complaint  Patient presents with  . Shoulder Pain    left     HPI Stacy Webb is a 72 y.o. female.  Presents for reevaluation of left shoulder pain she is had for over 3 years.  Complains of a dull aching pain left shoulder associated decreased range of motion some weakness decreased forward elevation.  She got an injection 3 years ago does not want to have another one  Review of Systems Review of Systems  Musculoskeletal: Positive for joint pain.  Neurological: Negative for tingling, focal weakness and weakness.    Past Medical History:  Diagnosis Date  . Anxiety   . Arthritis    fingers  . 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[place])   . Diabetes mellitus without complication ([place])   . Excessive hair growth 07/19/2015  . Hyperlipidemia   . Hypertension   . Mental disorder   . Neuromuscular disorder ([place])    diabetic neuropathy  . Postmenopausal 07/19/2015    Past Surgical History:  Procedure Laterality Date  . APPENDECTOMY    . CARPAL TUNNEL RELEASE    . CESAREAN SECTION    . COLONOSCOPY N/A 08/28/2012   Procedure: COLONOSCOPY;  Surgeon: Rogene Houston, MD;  Location: [facility];  Service: Endoscopy;  Laterality: N/A;  915  . COLONOSCOPY N/A 12/25/2017   Procedure: COLONOSCOPY;  Surgeon: Rogene Houston, MD;  Location: [facility];  Service: Endoscopy;  Laterality: N/A;  12:45    Family History  Problem Relation Age of Onset  . Suicidality Son   . Diabetes Daughter   . Colon cancer Paternal Aunt      Social History   Tobacco Use  . Smoking status: Never Smoker  . Smokeless tobacco: Never Used  Substance Use Topics  . Alcohol use: No  . Drug use: No    Allergies  Allergen Reactions  . Clindamycin/Lincomycin     Mouth taste like metal  . Enalapril Other (See Comments)    Constant Cough     Current Meds  Medication Sig  . atorvastatin (LIPITOR) 20 MG  tablet Take 20 mg by mouth daily.  . cyanocobalamin 2000 MCG tablet Take 1,000 mcg by mouth 2 (two) times daily.   Marland Kitchen diltiazem (CARDIZEM) 120 MG tablet Take 120 mg by mouth 2 (two) times daily.  . ferrous sulfate 325 (65 FE) MG tablet Take 325 mg by mouth 2 (two) times daily.   Marland Kitchen gabapentin (NEURONTIN) 100 MG capsule Take 100 mg by mouth 2 (two) times daily.   . hydrochlorothiazide (HYDRODIURIL) 25 MG tablet   . losartan (COZAAR) 50 MG tablet Take 50 mg by mouth daily.  . metFORMIN (GLUCOPHAGE) 1000 MG tablet Take 1,000 mg by mouth 2 (two) times daily with a meal.       Physical Exam BP (!) 149/72   Pulse 68   Ht 5\' 5"  (1.651 m)   Wt 148 lb (67.1 kg)   BMI 24.63 kg/m  Physical Exam  Constitutional: She is oriented to person, place, and time. She appears well-developed and well-nourished.  Neurological: She is alert and oriented to person, place, and time. Gait normal.  Psychiatric: She has a normal mood and affect. Judgment normal.  Vitals reviewed.  Ambulatory status normal with no assistive devices Right Shoulder Exam  Right shoulder exam is normal.  Tenderness  The patient is experiencing no  tenderness.  Range of Motion  The patient has normal right shoulder ROM.  Muscle Strength  The patient has normal right shoulder strength.  Tests  Apprehension: negative  Other  Erythema: absent Sensation: normal Pulse: present   Left Shoulder Exam  Left shoulder exam is normal.  Tenderness  Left shoulder tenderness location: Peri-acromial tenderness in the scapular spine.  Tests  Apprehension: negative  Other  Erythema: absent Sensation: normal Pulse: present   Comments:  He has mild supraspinatus cuff weakness she has decreased abduction flexion and internal rotation as well as external rotation actively she has normal passive flexion and external rotation limited to 40 degrees versus her opposite shoulder which has 50 to 60 degrees       MEDICAL DECISION  SECTION  xrays ordered?  Yes  My independent reading of xrays: Moderate osteoarthritis left shoulder   Encounter Diagnosis  Name Primary?  . Chronic left shoulder pain Yes     PLAN:   No orders of the defined types were placed in this encounter. Plan is for Thera-Band exercises follow-up as needed patient will probably need a reverse shoulder replacement based on the x-ray showing proximal migration of the humerus

## 2018-02-16 ENCOUNTER — Other Ambulatory Visit (HOSPITAL_COMMUNITY): Payer: Self-pay | Admitting: Family Medicine

## 2018-02-16 DIAGNOSIS — Z1231 Encounter for screening mammogram for malignant neoplasm of breast: Secondary | ICD-10-CM

## 2018-03-13 ENCOUNTER — Ambulatory Visit (HOSPITAL_COMMUNITY)
Admission: RE | Admit: 2018-03-13 | Discharge: 2018-03-13 | Disposition: A | Discharge status: Home / Self Care | Payer: Medicare Other | Source: Ambulatory Visit | Attending: Family Medicine | Admitting: Family Medicine

## 2018-03-13 DIAGNOSIS — Z1231 Encounter for screening mammogram for malignant neoplasm of breast: Secondary | ICD-10-CM

## 2018-03-16 ENCOUNTER — Ambulatory Visit (INDEPENDENT_AMBULATORY_CARE_PROVIDER_SITE_OTHER): Payer: Medicare Other | Admitting: Obstetrics and Gynecology

## 2018-03-16 ENCOUNTER — Encounter (HOSPITAL_COMMUNITY): Payer: Self-pay

## 2018-03-16 ENCOUNTER — Encounter: Payer: Self-pay | Admitting: Obstetrics and Gynecology

## 2018-03-16 ENCOUNTER — Ambulatory Visit (HOSPITAL_COMMUNITY)
Admission: RE | Admit: 2018-03-16 | Discharge: 2018-03-16 | Disposition: A | Discharge status: Home / Self Care | Payer: Medicare Other | Source: Ambulatory Visit | Attending: Family Medicine | Admitting: Family Medicine

## 2018-03-16 VITALS — BP 159/71 | HR 74 | Ht 66.0 in | Wt 148.4 lb

## 2018-03-16 DIAGNOSIS — Z1231 Encounter for screening mammogram for malignant neoplasm of breast: Secondary | ICD-10-CM | POA: Insufficient documentation

## 2018-03-16 DIAGNOSIS — Z780 Asymptomatic menopausal state: Secondary | ICD-10-CM

## 2018-03-16 DIAGNOSIS — N816 Rectocele: Secondary | ICD-10-CM

## 2018-03-16 DIAGNOSIS — N812 Incomplete uterovaginal prolapse: Secondary | ICD-10-CM

## 2018-03-16 MED ORDER — ESTROGENS, CONJUGATED 0.625 MG/GM VA CREA: 0.2500 | TOPICAL_CREAM | VAGINAL | 2 refills | Status: DC

## 2018-03-16 NOTE — Progress Notes (Signed)
Patient ID: Stacy Webb, female   DOB: 11-Mar-1946, 72 y.o.   MRN: [id number]    [facility] Visit  @DATE @            Patient name: Stacy Webb MRN [id number]  Date of birth: 1945/12/30  CC & HPI:  Stacy Webb is a 72 y.o. female presenting today for rectocele. Had seen Anderson Malta back in March but rectocele is now giving her issues. Tries not to get constipated but when she does it feels as if "something" is going to fall out of her vagina. Has had not a hysterectomy delivered all her children. c-sections. Last bowel movement was yesterday. She can tell when she needs to go to her abdomen feels full. She does have to strain when having a bowel movement. She wears a pad but when has the feeling that something is coming out it irritates the rectal tissues.  ROS:  ROS +rectocele +chronic constipation -fever -chills All systems are negative except as noted in the HPI and PMH.    Pertinent History Reviewed:   Reviewed:  Medical         Past Medical History:  Diagnosis Date  . Anxiety   . Arthritis    fingers  . 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[place])   . Diabetes mellitus without complication ([place])   . Excessive hair growth 07/19/2015  . Hyperlipidemia   . Hypertension   . Mental disorder   . Neuromuscular disorder ([place])    diabetic neuropathy  . Postmenopausal 07/19/2015                              Surgical Hx:    Past Surgical History:  Procedure Laterality Date  . APPENDECTOMY    . CARPAL TUNNEL RELEASE    . CESAREAN SECTION    . COLONOSCOPY N/A 08/28/2012   Procedure: COLONOSCOPY;  Surgeon: Rogene Houston, MD;  [facility];  Service: Endoscopy;  Laterality: N/A;  915  . COLONOSCOPY N/A 12/25/2017   Procedure: COLONOSCOPY;  Surgeon: Rogene Houston, MD;  [facility];  Service: Endoscopy;  Laterality: N/A;  12:45   Medications: Reviewed & Updated - see associated section                       Current Outpatient Medications:  .   acetaminophen (TYLENOL) 500 MG tablet, Take 500 mg by mouth every 6 (six) hours as needed., Disp: , Rfl:  .  atorvastatin (LIPITOR) 20 MG tablet, Take 20 mg by mouth daily., Disp: , Rfl:  .  cyanocobalamin 2000 MCG tablet, Take 1,000 mcg by mouth 2 (two) times daily. , Disp: , Rfl:  .  diltiazem (CARDIZEM) 120 MG tablet, Take 120 mg by mouth 2 (two) times daily., Disp: , Rfl:  .  ferrous sulfate 325 (65 FE) MG tablet, Take 325 mg by mouth 2 (two) times daily. , Disp: , Rfl:  .  gabapentin (NEURONTIN) 100 MG capsule, Take 100 mg by mouth 2 (two) times daily. , Disp: , Rfl:  .  hydrochlorothiazide (HYDRODIURIL) 25 MG tablet, , Disp: , Rfl:  .  losartan (COZAAR) 50 MG tablet, Take 50 mg by mouth daily., Disp: , Rfl:  .  metFORMIN (GLUCOPHAGE) 1000 MG tablet, Take 1,000 mg by mouth 2 (two) times daily with a meal., Disp: , Rfl:    Social History: Reviewed -  reports that she has never smoked. She has never used smokeless tobacco.  Objective Findings:  Vitals: Blood pressure (!) 159/71, pulse 74, height 5\' 6"  (1.676 m), weight 148 lb 6.4 oz (67.3 kg).  PHYSICAL EXAMINATION General appearance - alert, well appearing, and in no distress and oriented to person, place, and time Mental status - alert, oriented to person, place, and time, normal mood, behavior, speech, dress, motor activity, and thought processes, affect appropriate to mood   PELVIC Vagina - rectocele, Urethra- good support but with rotational descent with urethra  Cervix - cervix come down 2 cm over vaginal entrance, cystocele  Uterus - small Rectal- anatomic weakness   Assessment & Plan:   A:  1.  Rectocele 2. 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 Minimal cystocele  P:  1.  Proposed posterior repair 2. F/u in 4 weeks for pre-op    By signing my name below, I, Samul Dada, attest that this documentation has been prepared under the direction and in the presence of Jonnie Kind, MD. Electronically Signed: [place]. 03/16/18. 9:53 AM.  I personally performed the services described in this documentation, which was SCRIBED in my presence. The recorded information has been reviewed and considered accurate. It has been edited as necessary during review. Jonnie Kind, MD

## 2018-03-24 ENCOUNTER — Telehealth: Payer: Self-pay | Admitting: Orthopedic Surgery

## 2018-03-24 NOTE — Telephone Encounter (Signed)
Called patient to notify - records per request are ready for pickup.(under "release of information" tab)

## 2018-04-13 ENCOUNTER — Encounter: Payer: Self-pay | Admitting: Obstetrics and Gynecology

## 2018-04-13 ENCOUNTER — Other Ambulatory Visit (HOSPITAL_COMMUNITY)
Admission: RE | Admit: 2018-04-13 | Discharge: 2018-04-13 | Disposition: A | Discharge status: Home / Self Care | Payer: Medicare Other | Source: Ambulatory Visit | Attending: Obstetrics and Gynecology | Admitting: Obstetrics and Gynecology

## 2018-04-13 ENCOUNTER — Ambulatory Visit (INDEPENDENT_AMBULATORY_CARE_PROVIDER_SITE_OTHER): Payer: Medicare Other | Admitting: Obstetrics and Gynecology

## 2018-04-13 VITALS — BP 194/77 | HR 68 | Ht 65.0 in | Wt 148.8 lb

## 2018-04-13 DIAGNOSIS — Z124 Encounter for screening for malignant neoplasm of cervix: Secondary | ICD-10-CM | POA: Diagnosis present

## 2018-04-13 DIAGNOSIS — Z01818 Encounter for other preprocedural examination: Secondary | ICD-10-CM | POA: Diagnosis not present

## 2018-04-13 NOTE — Progress Notes (Signed)
Patient ID: Stacy Webb, female   DOB: [birth year], 73 y.o.   MRN: [id number]  Preoperative History and Physical  Stacy Webb is a 73 y.o. G2P2001 here for surgical management of rectocele.  No significant preoperative concerns. Still has constipation with taking miralax to alleviate the the abdominal pressure when bowel is full. Still has feeling "that something is dropping out".   Had dizziness when lying back for pelvic exam. Per Dr. Cain Saupe pulse is strong and regular. BP was up this morning at check in. Head rest was raised and started feeling some relief.  Proposed surgery: Vaginal hysterectomy with anterior repair and posterior repair.  Past Medical History:  Diagnosis Date  . Anxiety   . Arthritis    fingers  . 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[place])   . Diabetes mellitus without complication ([place])   . Excessive hair growth 07/19/2015  . Hyperlipidemia   . Hypertension   . Mental disorder   . Neuromuscular disorder ([place])    diabetic neuropathy  . Postmenopausal 07/19/2015   Past Surgical History:  Procedure Laterality Date  . APPENDECTOMY    . CARPAL TUNNEL RELEASE    . CESAREAN SECTION    . COLONOSCOPY N/A 08/28/2012   Procedure: COLONOSCOPY;  Surgeon: Rogene Houston, MD;  [facility];  Service: Endoscopy;  Laterality: N/A;  915  . COLONOSCOPY N/A 12/25/2017   Procedure: COLONOSCOPY;  Surgeon: Rogene Houston, MD;  [facility];  Service: Endoscopy;  Laterality: N/A;  12:45   OB History  Gravida Para Term Preterm AB Living  2 2 2     1   SAB TAB Ectopic Multiple Live Births        1 3    # Outcome Date GA Lbr Len/2nd Weight Sex Delivery Anes PTL Lv  2 Term     F CS-Unspec   LIV  1A Term     M CS-Unspec   DEC  1B Term     M CS-Unspec   DEC  Patient denies any other pertinent gynecologic issues.   Current Outpatient Medications on File Prior to Visit  Medication Sig Dispense Refill  . acetaminophen (TYLENOL) 500 MG tablet Take 500 mg by  mouth every 6 (six) hours as needed.    Marland Kitchen atorvastatin (LIPITOR) 20 MG tablet Take 20 mg by mouth daily.    Marland Kitchen conjugated estrogens (PREMARIN) vaginal cream Place 9.38 Applicatorfuls vaginally 3 (three) times a week. For vaginal thinning and irritation in preop timeframe 42.5 g 2  . cyanocobalamin 2000 MCG tablet Take 1,000 mcg by mouth 2 (two) times daily.     Marland Kitchen diltiazem (CARDIZEM) 120 MG tablet Take 120 mg by mouth 2 (two) times daily.    . ferrous sulfate 325 (65 FE) MG tablet Take 325 mg by mouth 2 (two) times daily.     Marland Kitchen gabapentin (NEURONTIN) 100 MG capsule Take 100 mg by mouth 2 (two) times daily.     . hydrochlorothiazide (HYDRODIURIL) 25 MG tablet Take 25 mg by mouth daily.     Marland Kitchen losartan (COZAAR) 50 MG tablet Take 50 mg by mouth daily.    . metFORMIN (GLUCOPHAGE) 1000 MG tablet Take 1,000 mg by mouth 2 (two) times daily with a meal.     No current facility-administered medications on file prior to visit.    Allergies  Allergen Reactions  . Clindamycin/Lincomycin     Mouth taste like metal  . Enalapril Other (See Comments)  Constant Cough    Social History:   reports that she has never smoked. She has never used smokeless tobacco. She reports that she does not drink alcohol or use drugs.  Family History  Problem Relation Age of Onset  . Pneumonia Son   . Suicidality Son   . Diabetes Daughter   . Colon cancer Paternal Aunt     Review of Systems: Noncontributory  PHYSICAL EXAM: Blood pressure (!) 169/74, pulse 67, height 5\' 5"  (1.651 m), weight 148 lb 12.8 oz (67.5 kg). General appearance - alert, well appearing, dizziness upon pelvic exam Chest - clear to auscultation, no wheezes, rales or rhonchi, symmetric air entry Heart - normal rate and regular rhythm Abdomen - soft, nontender, nondistended, no masses or organomegaly  PELVIC Vagina-atrophic, postmenopausal, cystocele  Cervix - atrophic Uterus - good support Rectal - rectocele noted stool guaiac  negative. PAP- done, thin prep brush used Extremities - peripheral pulses normal, no pedal edema, no clubbing or cyanosis  Labs: No results found for this or any previous visit (from the past 336 hour(s)).  Imaging Studies: Mm 3d Screen Breast Bilateral  Result Date: 03/16/2018 CLINICAL DATA:  Screening. EXAM: DIGITAL SCREENING BILATERAL MAMMOGRAM WITH TOMO AND CAD COMPARISON:  Previous exam(s). ACR Breast Density Category c: The breast tissue is heterogeneously dense, which may obscure small masses. FINDINGS: There are no findings suspicious for malignancy. Images were processed with CAD. IMPRESSION: No mammographic evidence of malignancy. A result letter of this screening mammogram will be mailed directly to the patient. RECOMMENDATION: Screening mammogram in one year. (Code:SM-B-01Y) BI-RADS CATEGORY  1: Negative. Electronically Signed   By: Everlean Alstrom M.D.   On: 03/16/2018 12:32    Assessment: Patient Active Problem List   Diagnosis Date Noted  . Absolute anemia 10/30/2017  . Constipation 06/10/2017  . Hemorrhoids 06/10/2017  . Cystocele with rectocele 06/10/2017  . Rectocele 06/10/2017  . Diabetic foot ulcer ([place]) 09/27/2016  . CKD (chronic kidney disease), stage III ([place]) 09/27/2016  . 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[place]) 09/27/2016  . Hyperlipidemia 09/27/2016  . Hypertension 09/27/2016  . Diabetic ulcer of foot associated with diabetes mellitus due to underlying condition, limited to breakdown of skin ([place]) 09/27/2016  . Postmenopausal 07/19/2015  . Excessive hair growth 07/19/2015  . Essential hypertension, benign 08/11/2012  . High cholesterol 08/11/2012  . Family hx of colon cancer 08/11/2012  . Diabetes ([place]) 08/11/2012   Assessment: 1. Rectocele 2. Cystocele 3. Pap, thin prep 4. Anxious chronic hypertension, worsened by pt anxiety   Plan: Patient will undergo surgical management with  anterior and posterior repair. Will not require  hysterectomy   By signing my name below, I, Stacy Webb, attest that this documentation has been prepared under the direction and in the presence of Jonnie Kind, MD. Electronically Signed: [place]. 04/13/18. 9:41 AM.  I personally performed the services described in this documentation, which was SCRIBED in my presence. The recorded information has been reviewed and considered accurate. It has been edited as necessary during review. Jonnie Kind, MD

## 2018-04-14 LAB — CYTOLOGY - PAP
DIAGNOSIS: NEGATIVE
HPV: NOT DETECTED

## 2018-04-16 NOTE — Patient Instructions (Signed)
Stacy Webb  04/16/2018     @PREFPERIOPPHARMACY @   Your procedure is scheduled on  04/28/2018  Report to [facility] at  615   A.M.  Call this number if you have problems the morning of surgery:  [phone]   Remember:  Do not eat or drink after midnight.                        Take these medicines the morning of surgery with A SIP OF WATER  Diltiazem, gabapentin, losartan.    Do not wear jewelry, make-up or nail polish.  Do not wear lotions, powders, or perfumes, or deodorant.  Do not shave 48 hours prior to surgery.  Men may shave face and neck.  Do not bring valuables to the hospital.  [facility] is not responsible for any belongings or valuables.  Contacts, dentures or bridgework may not be worn into surgery.  Leave your suitcase in the car.  After surgery it may be brought to your room.  For patients admitted to the hospital, discharge time will be determined by your treatment team.  Patients discharged the day of surgery will not be allowed to drive home.   Name and phone number of your driver:   Family Special instructions:  DO NOT take any medications for diabetes the morning of your surgery.  Please read over the following fact sheets that you were given. Pain Booklet, Anesthesia Post-op Instructions and Care and Recovery After Surgery       Anterior and Posterior Colporrhaphy  Anterior or posterior colporrhaphy is surgery to fix a prolapse of organs in the genital tract. Prolapse is a condition in which an organ bulges or drops down from its normal position. Organs that commonly prolapse include the rectum, bladder, vagina, and uterus. Prolapse can affect a single organ or several organs at the same time.  You may need this surgery if you have a severe prolapse that causes symptoms that interfere with your daily life and cannot be corrected with other treatments. Prolapse often worsens when women stop having their monthly periods (menopause)  because estrogen loss weakens the muscles and tissues in the genital tract. Prolapse can also happen when the organs are damaged or weakened. This commonly happens after childbirth and as a result of aging. The type of colporrhaphy done depends on the type of genital prolapse. Types of genital prolapse include the following:  Cystocele. This is a prolapse of the bladder and the upper part of the front (anterior) wall of the vagina.  Rectocele. This is a prolapse of the rectum and the lower part of the back (posterior) wall of the vagina.  Enterocele. This is a prolapse of the small intestine. It appears as a bulge under the neck of the uterus at the top of the back wall of the vagina.  Procidentia. This is a complete prolapse of the uterus and the cervix. The prolapse can be seen and felt coming out of the vagina. Tell a health care provider about:  Any allergies you have.  All medicines you are taking, including vitamins, herbs, eye drops, creams, and over-the-counter medicines.  Any problems you or family members have had with anesthetic medicines.  Any blood disorders you have.  Any surgeries you have had.  Any medical conditions you have.  Smoking history or history of alcohol use.  Whether you are pregnant or may be pregnant. What  are the risks? Generally, this is a safe procedure. However, problems may occur, including:  Infection.  Bleeding.  Allergic reactions to medicines.  Damage to other structures or organs.  Problems urinating.  Incontinence.  Nerve damage.  Painful sex.  Constipation.  A blood clot that travels to your lungs. What happens before the procedure? Staying hydrated Follow instructions from your health care provider about hydration, which may include:  Up to 2 hours before the procedure - you may continue to drink clear liquids, such as water, clear fruit juice, black coffee, and plain tea. Eating and drinking restrictions Follow  instructions from your health care provider about eating and drinking, which may include:  8 hours before the procedure - stop eating heavy meals or foods such as meat, fried foods, or fatty foods.  6 hours before the procedure - stop eating light meals or foods, such as toast or cereal.  6 hours before the procedure - stop drinking milk or drinks that contain milk.  2 hours before the procedure - stop drinking clear liquids. General instructions  Ask your health care provider about: ? Changing or stopping your regular medicines. This is especially important if you are taking diabetes medicines or blood thinners. ? Taking medicines such as aspirin and ibuprofen. These medicines can thin your blood. Do not take these medicines before your procedure if your health care provider instructs you not to.  You may be given antibiotics to help prevent infection.  You may be instructed to use estrogen cream in your vagina to help prevent complications and promote healing.  Do not use any products that contain nicotine or tobacco, such as cigarettes and e-cigarettes, for at least 2 weeks before the procedure. If you need help quitting, ask your health care provider.  Plan to have someone take you home from the hospital. Also, arrange for someone to help you with activities during recovery. What happens during the procedure?  To lower your risk of infection: ? Your health care team will wash or sanitize their hands. ? Your skin will be washed with soap. ? Hair may be removed from the surgical area.  An IV will be inserted into one of your veins.  You will be given one or more of the following: ? A medicine to help you relax (sedative). ? A medicine to make you fall asleep (general anesthetic).  You may be given antibiotics through your IV.  You will lie down on the operating table with your feet in stirrups.  A small, thin tube (catheter) will be inserted through your urethra into your  bladder to drain urine during surgery and recovery.  An instrument (vaginal speculum) will be used to hold your vagina open.  Your health care provider will perform the procedure according to the type of repair you require: Anterior repair  An incision will be made in the midline section of the front part of the vaginal wall.  A triangular-shaped piece of vaginal tissue will be removed.  The stronger, healthier tissue will be sewn together in order to support the bladder.  These incisions may be closed with stitches (sutures).  Gauze packing will be placed inside your vagina. Posterior repair  An incision will be made midline on the back wall of the vagina.  A triangular portion of vaginal skin will be removed to expose the muscle.  Excess tissue will be removed, and stronger, healthier muscle and ligament tissue will be sewn together to support the rectum.  These incisions  may be closed with stitches (sutures).  Gauze packing will be placed inside your vagina. Anterior and posterior repair  Both procedures will be done during the same surgery. The procedure may vary among health care providers and hospitals. What happens after the procedure?  Your blood pressure, heart rate, breathing rate, and blood oxygen level will be monitored until the medicines you were given have worn off.  You will be given pain medicine as needed.  You will have a small tube in place to drain your bladder (urinary catheter). This will be in place until your bladder is working properly on its own.  You may have a gauze packing in your vagina for a few days to prevent bleeding.  You will start on a liquid diet and slowly move to a regular diet.  You will be encouraged to get up and walk as soon as you are able.  You may need to wear compression stockings. They help prevent blood clots and reduce swelling in your legs.  Do not drive for 24 hours if you were given a sedative. Summary  Anterior  or posterior colporrhaphy is surgery to fix a prolapse of organs in the genital tract.  The type of repair done depends on the type of prolapse that is present.  Follow instructions from your health care provider about eating and drinking before the procedure.  You will be given a general anesthetic to make you fall asleep during the procedure. This information is not intended to replace advice given to you by your health care provider. Make sure you discuss any questions you have with your health care provider. Document Released: 06/15/2003 Document Revised: 05/02/2016 Document Reviewed: 05/02/2016 Elsevier Interactive Patient Education  2019 [place].  Anterior and Posterior Colporrhaphy, Care After This sheet gives you information about how to care for yourself after your procedure. Your health care provider may also give you more specific instructions. If you have problems or questions, contact your health care provider. What can I expect after the procedure? After the procedure, it is common to have:  Pain in the surgical area.  Vaginal spotting and discharge. You will need to use a sanitary pad during this time.  Fatigue. Follow these instructions at home:  Incision care   Follow instructions from your health care provider about how to take care of your incision. Make sure you: ? Wash your hands with soap and water before touching the incision area. If soap and water are not available, use hand sanitizer. ? Clean your incision as told by your health care provider. ? Leave stitches (sutures), skin glue, or adhesive strips in place. These skin closures may need to stay in place for 2 weeks or longer. If adhesive strip edges start to loosen and curl up, you may trim the loose edges. Do not remove adhesive strips completely unless your health care provider tells you to do that.  Check your incision area every day for signs of infection. Check for: ? Redness, swelling, or  pain. ? Fluid or blood. ? Warmth. ? Pus or a bad smell.  Check your incision every day to make sure the incision area is not separating or opening up.  Do not take baths, swim, or use a hot tub until your health care provider approves. You may shower.  Keep the area between your vagina and rectum (perineal area) clean and dry. Make sure you clean the area after every bowel movement and each time you urinate.  Ask your health  care provider if you can take a sitz bath or sit in a tub of clean, warm water. Activity  Take frequent, short walks followed by rest periods throughout the day.  Avoid activities that take a lot of effort (are strenuous).  Do not lift anything that is heavier than 10 lb (4.5 kg), or the limit that your health care provider tells you, until he or she says that it is safe. Avoid pushing or pulling motions.  Avoid standing for long periods of time.  Do not douche, use tampons, or have sex until your health care provider says it is okay.  Return to your normal activities as told by your health care provider. Ask your health care provider what activities are safe for you.  Do not drive until your health care provider approves. To prevent constipation To prevent or treat constipation while you are taking prescription pain medicine, your health care provider may recommend that you:  Take over-the-counter or prescription medicines.  Eat foods that are high in fiber, such as fresh fruits and vegetables, whole grains, and beans.  Drink enough fluid to keep your urine clear or pale yellow.  Limit foods that are high in fat and processed sugars, such as fried and sweet foods. General instructions  Take over-the-counter and prescription medicines only as told by your health care provider.  You may be instructed to do pelvic floor exercises (kegels) as told by your health care provider.  Do not drive or use heavy machinery while taking prescription pain  medicine.  Wear compression stockings as told by your health care provider. These stockings help to prevent blood clots and reduce swelling in your legs.  Keep all follow-up visits as told by your health care provider. This is important. Contact a health care provider if:  Medicine does not help your pain.  You have frequent or urgent urination, or you are unable to completely empty your bladder.  You feel a burning sensation when urinating.  You have pus or a bad smell coming from your vaginal area.  You have redness, swelling, or increasing pain in the vaginal area. Get help right away if:  You have increased bleeding from the vaginal area.  You cannot urinate.  You have a fever or chills.  Your incision separates or opens.  You have trouble breathing. Summary  After the procedure, it is common to have pain, fatigue, spotting, and discharge from the vagina.  Keep the area between your vagina and rectum (perineal area) clean and dry. Make sure you clean the area after every bowel movement and each time you urinate.  Follow instructions from your health care provider about any activity restrictions after the procedure. This information is not intended to replace advice given to you by your health care provider. Make sure you discuss any questions you have with your health care provider. Document Released: 10/11/2004 Document Revised: 05/02/2016 Document Reviewed: 05/02/2016 Elsevier Interactive Patient Education  2019 [place] Anesthesia, Adult General anesthesia is the use of medicines to make a person "go to sleep" (unconscious) for a medical procedure. General anesthesia must be used for certain procedures, and is often recommended for procedures that:  Last a long time.  Require you to be still or in an unusual position.  Are major and can cause blood loss. The medicines used for general anesthesia are called general anesthetics. As well as making you  unconscious for a certain amount of time, these medicines:  Prevent pain.  Control  your blood pressure.  Relax your muscles. Tell a health care provider about:  Any allergies you have.  All medicines you are taking, including vitamins, herbs, eye drops, creams, and over-the-counter medicines.  Any problems you or family members have had with anesthetic medicines.  Types of anesthetics you have had in the past.  Any blood disorders you have.  Any surgeries you have had.  Any medical conditions you have.  Any recent upper respiratory, chest, or ear infections.  Any history of: ? Heart or lung conditions, such as heart failure, sleep apnea, asthma, or chronic obstructive pulmonary disease (COPD). ? [employer] forces logistics/support/administrative officer. ? Depression or anxiety.  Any tobacco or drug use, including marijuana or alcohol use.  Whether you are pregnant or may be pregnant. What are the risks? Generally, this is a safe procedure. However, problems may occur, including:  Allergic reaction.  Lung and heart problems.  Inhaling food or liquid from the stomach into the lungs (aspiration).  Nerve injury.  Dental injury.  Air in the bloodstream, which can lead to stroke.  Extreme agitation or confusion (delirium) when you wake up from the anesthetic.  Waking up during your procedure and being unable to move. This is rare. These problems are more likely to develop if you are having a major surgery or if you have an advanced or serious medical condition. You can prevent some of these complications by answering all of your health care provider's questions thoroughly and by following all instructions before your procedure. General anesthesia can cause side effects, including:  Nausea or vomiting.  A sore throat from the breathing tube.  Hoarseness.  Wheezing or coughing.  Shaking chills.  Tiredness.  Body aches.  Anxiety.  Sleepiness or drowsiness.  Confusion or agitation. What happens  before the procedure? Staying hydrated Follow instructions from your health care provider about hydration, which may include:  Up to 2 hours before the procedure - you may continue to drink clear liquids, such as water, clear fruit juice, black coffee, and plain tea.  Eating and drinking restrictions Follow instructions from your health care provider about eating and drinking, which may include:  8 hours before the procedure - stop eating heavy meals or foods such as meat, fried foods, or fatty foods.  6 hours before the procedure - stop eating light meals or foods, such as toast or cereal.  6 hours before the procedure - stop drinking milk or drinks that contain milk.  2 hours before the procedure - stop drinking clear liquids. Medicines Ask your health care provider about:  Changing or stopping your regular medicines. This is especially important if you are taking diabetes medicines or blood thinners.  Taking medicines such as aspirin and ibuprofen. These medicines can thin your blood. Do not take these medicines unless your health care provider tells you to take them.  Taking over-the-counter medicines, vitamins, herbs, and supplements. Do not take these during the week before your procedure unless your health care provider approves them. General instructions  Starting 3-6 weeks before the procedure, do not use any products that contain nicotine or tobacco, such as cigarettes and e-cigarettes. If you need help quitting, ask your health care provider.  If you brush your teeth on the morning of the procedure, make sure to spit out all of the toothpaste.  Tell your health care provider if you become ill or develop a cold, cough, or fever.  If instructed by your health care provider, bring your sleep apnea device with you  on the day of your surgery (if applicable).  Ask your health care provider if you will be going home the same day, the following day, or after a longer hospital  stay. ? Plan to have someone take you home from the hospital or clinic. ? Plan to have a responsible adult care for you for at least 24 hours after you leave the hospital or clinic. This is important. What happens during the procedure?   You will be given anesthetics through both of the following: ? A mask placed over your nose and mouth. ? An IV in one of your veins.  You may receive a medicine to help you relax (sedative).  After you are unconscious, a breathing tube may be inserted down your throat to help you breathe. This will be removed before you wake up.  An anesthesia specialist will stay with you throughout your procedure. He or she will: ? Keep you comfortable and safe by continuing to give you medicines and adjusting the amount of medicine that you get. ? Monitor your blood pressure, pulse, and oxygen levels to make sure that the anesthetics do not cause any problems. The procedure may vary among health care providers and hospitals. What happens after the procedure?  Your blood pressure, temperature, heart rate, breathing rate, and blood oxygen level will be monitored until the medicines you were given have worn off.  You will wake up in a recovery area. You may wake up slowly.  If you feel anxious or agitated, you may be given medicine to help you calm down.  If you will be going home the same day, your health care provider may check to make sure you can walk, drink, and urinate.  Your health care provider will treat any pain or side effects you have before you go home.  Do not drive for 24 hours if you were given a sedative. Summary  General anesthesia is used to keep you still and prevent pain during a procedure.  It is important to tell your health care provider about your medical history and any surgeries you have had, and previous experience with anesthesia.  Follow your health care provider's instructions about when to stop eating, drinking, or taking certain  medicines before your procedure.  Plan to have someone take you home from the hospital or clinic. This information is not intended to replace advice given to you by your health care provider. Make sure you discuss any questions you have with your health care provider. Document Released: 07/02/2007 Document Revised: 08/12/2017 Document Reviewed: 11/08/2016 Elsevier Interactive Patient Education  2019 [place] Anesthesia, Adult, Care After This sheet gives you information about how to care for yourself after your procedure. Your health care provider may also give you more specific instructions. If you have problems or questions, contact your health care provider. What can I expect after the procedure? After the procedure, the following side effects are common:  Pain or discomfort at the IV site.  Nausea.  Vomiting.  Sore throat.  Trouble concentrating.  Feeling cold or chills.  Weak or tired.  Sleepiness and fatigue.  Soreness and body aches. These side effects can affect parts of the body that were not involved in surgery. Follow these instructions at home:  For at least 24 hours after the procedure:  Have a responsible adult stay with you. It is important to have someone help care for you until you are awake and alert.  Rest as needed.  Do not: ?  Participate in activities in which you could fall or become injured. ? Drive. ? Use heavy machinery. ? Drink alcohol. ? Take sleeping pills or medicines that cause drowsiness. ? Make important decisions or sign legal documents. ? Take care of children on your own. Eating and drinking  Follow any instructions from your health care provider about eating or drinking restrictions.  When you feel hungry, start by eating small amounts of foods that are soft and easy to digest (bland), such as toast. Gradually return to your regular diet.  Drink enough fluid to keep your urine pale yellow.  If you vomit, rehydrate  by drinking water, juice, or clear broth. General instructions  If you have sleep apnea, surgery and certain medicines can increase your risk for breathing problems. Follow instructions from your health care provider about wearing your sleep device: ? Anytime you are sleeping, including during daytime naps. ? While taking prescription pain medicines, sleeping medicines, or medicines that make you drowsy.  Return to your normal activities as told by your health care provider. Ask your health care provider what activities are safe for you.  Take over-the-counter and prescription medicines only as told by your health care provider.  If you smoke, do not smoke without supervision.  Keep all follow-up visits as told by your health care provider. This is important. Contact a health care provider if:  You have nausea or vomiting that does not get better with medicine.  You cannot eat or drink without vomiting.  You have pain that does not get better with medicine.  You are unable to pass urine.  You develop a skin rash.  You have a fever.  You have redness around your IV site that gets worse. Get help right away if:  You have difficulty breathing.  You have chest pain.  You have blood in your urine or stool, or you vomit blood. Summary  After the procedure, it is common to have a sore throat or nausea. It is also common to feel tired.  Have a responsible adult stay with you for the first 24 hours after general anesthesia. It is important to have someone help care for you until you are awake and alert.  When you feel hungry, start by eating small amounts of foods that are soft and easy to digest (bland), such as toast. Gradually return to your regular diet.  Drink enough fluid to keep your urine pale yellow.  Return to your normal activities as told by your health care provider. Ask your health care provider what activities are safe for you. This information is not intended to  replace advice given to you by your health care provider. Make sure you discuss any questions you have with your health care provider. Document Released: 07/01/2000 Document Revised: 11/08/2016 Document Reviewed: 11/08/2016 Elsevier Interactive Patient Education  2019 [employer].

## 2018-04-22 ENCOUNTER — Other Ambulatory Visit: Payer: Self-pay | Admitting: Obstetrics and Gynecology

## 2018-04-22 NOTE — Progress Notes (Signed)
preOp orders placed.

## 2018-04-23 ENCOUNTER — Other Ambulatory Visit: Payer: Self-pay

## 2018-04-23 ENCOUNTER — Encounter (HOSPITAL_COMMUNITY)
Admission: RE | Admit: 2018-04-23 | Discharge: 2018-04-23 | Disposition: A | Discharge status: Home / Self Care | Payer: Medicare Other | Source: Ambulatory Visit | Attending: Obstetrics and Gynecology | Admitting: Obstetrics and Gynecology

## 2018-04-23 ENCOUNTER — Encounter (HOSPITAL_COMMUNITY): Payer: Self-pay

## 2018-04-23 DIAGNOSIS — Z01818 Encounter for other preprocedural examination: Secondary | ICD-10-CM | POA: Insufficient documentation

## 2018-04-23 HISTORY — DX: Polyneuropathy, unspecified: G62.9

## 2018-04-23 HISTORY — DX: Cardiac murmur, unspecified: R01.1

## 2018-04-23 HISTORY — DX: Anemia, unspecified: D64.9

## 2018-04-23 HISTORY — DX: Personal history of urinary calculi: Z87.442

## 2018-04-23 LAB — CBC WITH DIFFERENTIAL/PLATELET
Abs Immature Granulocytes: 0.02 10*3/uL (ref 0.00–0.07)
BASOS ABS: 0.1 10*3/uL (ref 0.0–0.1)
Basophils Relative: 1 %
Eosinophils Absolute: 0.3 10*3/uL (ref 0.0–0.5)
Eosinophils Relative: 4 %
HCT: 31.4 % — ABNORMAL LOW (ref 36.0–46.0)
Hemoglobin: 9.9 g/dL — ABNORMAL LOW (ref 12.0–15.0)
Immature Granulocytes: 0 %
Lymphocytes Relative: 33 %
Lymphs Abs: 2.7 10*3/uL (ref 0.7–4.0)
MCH: 26.8 pg (ref 26.0–34.0)
MCHC: 31.5 g/dL (ref 30.0–36.0)
MCV: 85.1 fL (ref 80.0–100.0)
Monocytes Absolute: 0.9 10*3/uL (ref 0.1–1.0)
Monocytes Relative: 11 %
Neutro Abs: 4.2 10*3/uL (ref 1.7–7.7)
Neutrophils Relative %: 51 %
Platelets: 251 10*3/uL (ref 150–400)
RBC: 3.69 MIL/uL — AB (ref 3.87–5.11)
RDW: 14.9 % (ref 11.5–15.5)
WBC: 8.2 10*3/uL (ref 4.0–10.5)
nRBC: 0 % (ref 0.0–0.2)

## 2018-04-23 LAB — BASIC METABOLIC PANEL
Anion gap: 9 (ref 5–15)
BUN: 20 mg/dL (ref 8–23)
CO2: 23 mmol/L (ref 22–32)
Calcium: 9 mg/dL (ref 8.9–10.3)
Chloride: 106 mmol/L (ref 98–111)
Creatinine, Ser: 1.17 mg/dL — ABNORMAL HIGH (ref 0.44–1.00)
GFR calc Af Amer: 54 mL/min — ABNORMAL LOW (ref >60)
GFR calc non Af Amer: 47 mL/min — ABNORMAL LOW (ref >60)
Glucose, Bld: 124 mg/dL — ABNORMAL HIGH (ref 70–99)
Potassium: 3.6 mmol/L (ref 3.5–5.1)
Sodium: 138 mmol/L (ref 135–145)

## 2018-04-23 LAB — TYPE AND SCREEN
ABO/RH(D): O POS
ANTIBODY SCREEN: NEGATIVE

## 2018-04-23 LAB — HEMOGLOBIN A1C
Hgb A1c MFr Bld: 6.8 % — ABNORMAL HIGH (ref 4.8–5.6)
Mean Plasma Glucose: 148.46 mg/dL

## 2018-04-23 LAB — GLUCOSE, CAPILLARY: Glucose-Capillary: 115 mg/dL — ABNORMAL HIGH (ref 70–99)

## 2018-04-24 ENCOUNTER — Encounter (HOSPITAL_COMMUNITY)
Admission: RE | Admit: 2018-04-24 | Discharge: 2018-04-24 | Disposition: A | Discharge status: Home / Self Care | Payer: Medicare Other | Source: Ambulatory Visit | Attending: Obstetrics and Gynecology | Admitting: Obstetrics and Gynecology

## 2018-04-24 DIAGNOSIS — Z01818 Encounter for other preprocedural examination: Secondary | ICD-10-CM | POA: Diagnosis not present

## 2018-04-24 LAB — URINALYSIS, ROUTINE W REFLEX MICROSCOPIC
Bacteria, UA: NONE SEEN
Bilirubin Urine: NEGATIVE
Glucose, UA: NEGATIVE mg/dL
Ketones, ur: NEGATIVE mg/dL
Leukocytes, UA: NEGATIVE
Nitrite: NEGATIVE
Protein, ur: NEGATIVE mg/dL
Specific Gravity, Urine: 1.012 (ref 1.005–1.030)
pH: 5 (ref 5.0–8.0)

## 2018-04-28 ENCOUNTER — Observation Stay (HOSPITAL_COMMUNITY)
Admission: RE | Admit: 2018-04-28 | Discharge: 2018-04-29 | Disposition: A | Discharge status: Home / Self Care | Payer: Medicare Other | Attending: Obstetrics and Gynecology | Admitting: Obstetrics and Gynecology

## 2018-04-28 ENCOUNTER — Observation Stay (HOSPITAL_COMMUNITY): Payer: Medicare Other | Admitting: Anesthesiology

## 2018-04-28 ENCOUNTER — Encounter (HOSPITAL_COMMUNITY)
Admission: RE | Disposition: A | Discharge status: Home / Self Care | Payer: Self-pay | Source: Home / Self Care | Attending: Obstetrics and Gynecology

## 2018-04-28 ENCOUNTER — Encounter (HOSPITAL_COMMUNITY): Payer: Self-pay | Admitting: Anesthesiology

## 2018-04-28 ENCOUNTER — Other Ambulatory Visit: Payer: Self-pay

## 2018-04-28 DIAGNOSIS — N8110 Cystocele, unspecified: Secondary | ICD-10-CM | POA: Insufficient documentation

## 2018-04-28 DIAGNOSIS — N816 Rectocele: Principal | ICD-10-CM | POA: Insufficient documentation

## 2018-04-28 DIAGNOSIS — D631 Anemia in chronic kidney disease: Secondary | ICD-10-CM | POA: Insufficient documentation

## 2018-04-28 DIAGNOSIS — Z881 Allergy status to other antibiotic agents status: Secondary | ICD-10-CM | POA: Insufficient documentation

## 2018-04-28 DIAGNOSIS — E1140 Type 2 diabetes mellitus with diabetic neuropathy, unspecified: Secondary | ICD-10-CM | POA: Insufficient documentation

## 2018-04-28 DIAGNOSIS — N183 Chronic kidney disease, stage 3 (moderate): Secondary | ICD-10-CM | POA: Diagnosis not present

## 2018-04-28 DIAGNOSIS — E11610 Type 2 diabetes mellitus with diabetic neuropathic arthropathy: Secondary | ICD-10-CM | POA: Insufficient documentation

## 2018-04-28 DIAGNOSIS — Z79890 Hormone replacement therapy (postmenopausal): Secondary | ICD-10-CM | POA: Insufficient documentation

## 2018-04-28 DIAGNOSIS — Z800 Family history of malignant neoplasm of digestive organs: Secondary | ICD-10-CM | POA: Insufficient documentation

## 2018-04-28 DIAGNOSIS — E785 Hyperlipidemia, unspecified: Secondary | ICD-10-CM | POA: Insufficient documentation

## 2018-04-28 DIAGNOSIS — I129 Hypertensive chronic kidney disease with stage 1 through stage 4 chronic kidney disease, or unspecified chronic kidney disease: Secondary | ICD-10-CM | POA: Insufficient documentation

## 2018-04-28 DIAGNOSIS — Z79899 Other long term (current) drug therapy: Secondary | ICD-10-CM | POA: Insufficient documentation

## 2018-04-28 DIAGNOSIS — E7800 Pure hypercholesterolemia, unspecified: Secondary | ICD-10-CM | POA: Diagnosis not present

## 2018-04-28 DIAGNOSIS — K5900 Constipation, unspecified: Secondary | ICD-10-CM | POA: Insufficient documentation

## 2018-04-28 DIAGNOSIS — Z888 Allergy status to other drugs, medicaments and biological substances status: Secondary | ICD-10-CM | POA: Diagnosis not present

## 2018-04-28 DIAGNOSIS — E1122 Type 2 diabetes mellitus with diabetic chronic kidney disease: Secondary | ICD-10-CM | POA: Diagnosis not present

## 2018-04-28 DIAGNOSIS — M19049 Primary osteoarthritis, unspecified hand: Secondary | ICD-10-CM | POA: Diagnosis not present

## 2018-04-28 DIAGNOSIS — Z7984 Long term (current) use of oral hypoglycemic drugs: Secondary | ICD-10-CM | POA: Insufficient documentation

## 2018-04-28 HISTORY — PX: ANTERIOR AND POSTERIOR REPAIR: SHX5121

## 2018-04-28 LAB — GLUCOSE, CAPILLARY
GLUCOSE-CAPILLARY: 126 mg/dL — AB (ref 70–99)
Glucose-Capillary: 130 mg/dL — ABNORMAL HIGH (ref 70–99)

## 2018-04-28 SURGERY — ANTERIOR (CYSTOCELE) AND POSTERIOR REPAIR (RECTOCELE)
Anesthesia: General

## 2018-04-28 MED ORDER — BUPIVACAINE-EPINEPHRINE (PF) 0.25% -1:200000 IJ SOLN
INTRAMUSCULAR | Status: DC | PRN
  Administered 2018-04-28: 10 mL
  Administered 2018-04-28: 8 mL

## 2018-04-28 MED ORDER — HYDROMORPHONE HCL 1 MG/ML IJ SOLN
0.2500 mg | INTRAMUSCULAR | Status: DC | PRN
  Administered 2018-04-28 (×2): 0.5 mg via INTRAVENOUS
  Filled 2018-04-28 (×2): qty 0.5

## 2018-04-28 MED ORDER — OXYCODONE HCL 5 MG PO TABS: 5.0000 mg | ORAL_TABLET | Freq: Four times a day (QID) | ORAL | Status: DC | PRN

## 2018-04-28 MED ORDER — ONDANSETRON HCL 4 MG/2ML IJ SOLN
4.0000 mg | Freq: Once | INTRAMUSCULAR | Status: AC | PRN
  Administered 2018-04-28: 4 mg via INTRAVENOUS
  Filled 2018-04-28: qty 2

## 2018-04-28 MED ORDER — HYDROCODONE-ACETAMINOPHEN 7.5-325 MG PO TABS: 1.0000 | ORAL_TABLET | Freq: Once | ORAL | Status: DC | PRN

## 2018-04-28 MED ORDER — ONDANSETRON HCL 4 MG/2ML IJ SOLN: 4.0000 mg | Freq: Four times a day (QID) | INTRAMUSCULAR | Status: DC | PRN

## 2018-04-28 MED ORDER — KETOROLAC TROMETHAMINE 30 MG/ML IJ SOLN
30.0000 mg | Freq: Once | INTRAMUSCULAR | Status: AC
  Administered 2018-04-28: 30 mg via INTRAVENOUS
  Filled 2018-04-28: qty 1

## 2018-04-28 MED ORDER — PANTOPRAZOLE SODIUM 40 MG PO TBEC
40.0000 mg | DELAYED_RELEASE_TABLET | Freq: Every day | ORAL | Status: DC
  Administered 2018-04-29: 40 mg via ORAL
  Filled 2018-04-28: qty 1

## 2018-04-28 MED ORDER — LACTATED RINGERS IV SOLN
INTRAVENOUS | Status: DC
  Administered 2018-04-28 (×2): via INTRAVENOUS

## 2018-04-28 MED ORDER — ENOXAPARIN SODIUM 40 MG/0.4ML ~~LOC~~ SOLN
40.0000 mg | SUBCUTANEOUS | Status: DC
  Filled 2018-04-28: qty 0.4

## 2018-04-28 MED ORDER — HYDROCODONE-ACETAMINOPHEN 5-325 MG PO TABS: 1.0000 | ORAL_TABLET | ORAL | Status: DC | PRN

## 2018-04-28 MED ORDER — FENTANYL CITRATE (PF) 100 MCG/2ML IJ SOLN
INTRAMUSCULAR | Status: DC | PRN
  Administered 2018-04-28 (×4): 25 ug via INTRAVENOUS
  Administered 2018-04-28: 50 ug via INTRAVENOUS

## 2018-04-28 MED ORDER — GABAPENTIN 100 MG PO CAPS: 100.0000 mg | ORAL_CAPSULE | Freq: Two times a day (BID) | ORAL | Status: DC

## 2018-04-28 MED ORDER — STERILE WATER FOR IRRIGATION IR SOLN
Status: DC | PRN
  Administered 2018-04-28: 1000 mL

## 2018-04-28 MED ORDER — BUPIVACAINE-EPINEPHRINE (PF) 0.25% -1:200000 IJ SOLN
INTRAMUSCULAR | Status: AC
  Filled 2018-04-28: qty 30

## 2018-04-28 MED ORDER — METFORMIN HCL 500 MG PO TABS
1000.0000 mg | ORAL_TABLET | Freq: Two times a day (BID) | ORAL | Status: DC
  Administered 2018-04-28 – 2018-04-29 (×2): 1000 mg via ORAL
  Filled 2018-04-28 (×2): qty 2

## 2018-04-28 MED ORDER — KETOROLAC TROMETHAMINE 30 MG/ML IJ SOLN
30.0000 mg | Freq: Once | INTRAMUSCULAR | Status: AC | PRN
  Administered 2018-04-28: 15 mg via INTRAVENOUS
  Filled 2018-04-28: qty 1

## 2018-04-28 MED ORDER — DILTIAZEM HCL 60 MG PO TABS
120.0000 mg | ORAL_TABLET | Freq: Two times a day (BID) | ORAL | Status: DC
  Administered 2018-04-28 – 2018-04-29 (×2): 120 mg via ORAL
  Filled 2018-04-28 (×2): qty 2

## 2018-04-28 MED ORDER — PROPOFOL 10 MG/ML IV BOLUS
INTRAVENOUS | Status: DC | PRN
  Administered 2018-04-28: 120 mg via INTRAVENOUS

## 2018-04-28 MED ORDER — ATORVASTATIN CALCIUM 20 MG PO TABS
20.0000 mg | ORAL_TABLET | Freq: Every day | ORAL | Status: DC
  Administered 2018-04-29: 20 mg via ORAL
  Filled 2018-04-28: qty 1

## 2018-04-28 MED ORDER — CEFAZOLIN SODIUM-DEXTROSE 2-4 GM/100ML-% IV SOLN
INTRAVENOUS | Status: AC
  Filled 2018-04-28: qty 100

## 2018-04-28 MED ORDER — VITAMIN B-12 1000 MCG PO TABS
500.0000 ug | ORAL_TABLET | Freq: Two times a day (BID) | ORAL | Status: DC
  Administered 2018-04-29: 500 ug via ORAL
  Filled 2018-04-28 (×2): qty 1

## 2018-04-28 MED ORDER — 0.9 % SODIUM CHLORIDE (POUR BTL) OPTIME
TOPICAL | Status: DC | PRN
  Administered 2018-04-28: 1000 mL

## 2018-04-28 MED ORDER — LOSARTAN POTASSIUM 50 MG PO TABS
50.0000 mg | ORAL_TABLET | Freq: Every day | ORAL | Status: DC
  Administered 2018-04-29: 50 mg via ORAL
  Filled 2018-04-28: qty 1

## 2018-04-28 MED ORDER — ONDANSETRON HCL 4 MG PO TABS: 4.0000 mg | ORAL_TABLET | Freq: Four times a day (QID) | ORAL | Status: DC | PRN

## 2018-04-28 MED ORDER — TRAMADOL HCL 50 MG PO TABS: 50.0000 mg | ORAL_TABLET | Freq: Four times a day (QID) | ORAL | Status: DC | PRN

## 2018-04-28 MED ORDER — HYDROCHLOROTHIAZIDE 25 MG PO TABS
25.0000 mg | ORAL_TABLET | Freq: Every day | ORAL | Status: DC
  Administered 2018-04-28 – 2018-04-29 (×2): 25 mg via ORAL
  Filled 2018-04-28 (×2): qty 1

## 2018-04-28 MED ORDER — EPHEDRINE SULFATE 50 MG/ML IJ SOLN
INTRAMUSCULAR | Status: DC | PRN
  Administered 2018-04-28 (×2): 10 mg via INTRAVENOUS

## 2018-04-28 MED ORDER — SODIUM CHLORIDE 0.9 % IV SOLN
INTRAVENOUS | Status: DC
  Administered 2018-04-28 – 2018-04-29 (×2): via INTRAVENOUS

## 2018-04-28 MED ORDER — GABAPENTIN 100 MG PO CAPS
100.0000 mg | ORAL_CAPSULE | Freq: Two times a day (BID) | ORAL | Status: DC
  Administered 2018-04-28 – 2018-04-29 (×2): 100 mg via ORAL
  Filled 2018-04-28 (×2): qty 1

## 2018-04-28 MED ORDER — POLYETHYLENE GLYCOL 3350 17 G PO PACK: 17.0000 g | PACK | Freq: Every day | ORAL | Status: DC | PRN

## 2018-04-28 MED ORDER — CEFAZOLIN SODIUM-DEXTROSE 2-4 GM/100ML-% IV SOLN
2.0000 g | INTRAVENOUS | Status: AC
  Administered 2018-04-28: 2 g via INTRAVENOUS

## 2018-04-28 MED ORDER — PROPOFOL 10 MG/ML IV BOLUS
INTRAVENOUS | Status: AC
  Filled 2018-04-28: qty 20

## 2018-04-28 MED ORDER — MEPERIDINE HCL 50 MG/ML IJ SOLN: 6.2500 mg | INTRAMUSCULAR | Status: DC | PRN

## 2018-04-28 MED ORDER — FENTANYL CITRATE (PF) 250 MCG/5ML IJ SOLN
INTRAMUSCULAR | Status: AC
  Filled 2018-04-28: qty 5

## 2018-04-28 SURGICAL SUPPLY — 29 items
CLOTH BEACON ORANGE TIMEOUT ST (SAFETY) ×2 IMPLANT
COVER LIGHT HANDLE STERIS (MISCELLANEOUS) ×4 IMPLANT
COVER WAND RF STERILE (DRAPES) ×2 IMPLANT
DECANTER SPIKE VIAL GLASS SM (MISCELLANEOUS) ×2 IMPLANT
DRAPE HALF SHEET 40X57 (DRAPES) ×2 IMPLANT
DRAPE STERI URO 9X17 APER PCH (DRAPES) ×2 IMPLANT
ELECT REM PT RETURN 9FT ADLT (ELECTROSURGICAL) ×2
ELECTRODE REM PT RTRN 9FT ADLT (ELECTROSURGICAL) ×1 IMPLANT
GAUZE PACKING 2X5 YD STRL (GAUZE/BANDAGES/DRESSINGS) ×2 IMPLANT
GLOVE BIO SURGEON STRL SZ7 (GLOVE) ×2 IMPLANT
GLOVE BIOGEL PI IND STRL 7.0 (GLOVE) ×1 IMPLANT
GLOVE BIOGEL PI IND STRL 9 (GLOVE) ×1 IMPLANT
GLOVE BIOGEL PI INDICATOR 7.0 (GLOVE) ×1
GLOVE BIOGEL PI INDICATOR 9 (GLOVE) ×1
GLOVE ECLIPSE 6.5 STRL STRAW (GLOVE) ×4 IMPLANT
GLOVE ECLIPSE 9.0 STRL (GLOVE) ×4 IMPLANT
GOWN SPEC L3 XXLG W/TWL (GOWN DISPOSABLE) ×2 IMPLANT
GOWN STRL REUS W/TWL LRG LVL3 (GOWN DISPOSABLE) ×4 IMPLANT
KIT TURNOVER CYSTO (KITS) ×2 IMPLANT
MANIFOLD NEPTUNE II (INSTRUMENTS) ×2 IMPLANT
NEEDLE HYPO 25X1 1.5 SAFETY (NEEDLE) ×2 IMPLANT
NS IRRIG 1000ML POUR BTL (IV SOLUTION) ×2 IMPLANT
PACK PERI GYN (CUSTOM PROCEDURE TRAY) ×2 IMPLANT
PAD ARMBOARD 7.5X6 YLW CONV (MISCELLANEOUS) ×2 IMPLANT
SET BASIN LINEN APH (SET/KITS/TRAYS/PACK) ×2 IMPLANT
SUT CHROMIC 2 0 CT 1 (SUTURE) ×4 IMPLANT
SUT VIC AB 0 CT2 8-18 (SUTURE) ×2 IMPLANT
TRAY FOLEY MTR SLVR 16FR STAT (SET/KITS/TRAYS/PACK) ×2 IMPLANT
WATER STERILE IRR 1000ML POUR (IV SOLUTION) ×2 IMPLANT

## 2018-04-28 NOTE — H&P (Signed)
Preoperative History and Physical  Stacy Webb is a 73 y.o. G2P2001 here for surgical management of rectocele.  No significant preoperative concerns. Still has constipation with taking miralax to alleviate the the abdominal pressure when bowel is full. Still has feeling "that something is dropping out".     Proposed surgery:  anterior repair and posterior repair.      Past Medical History:  Diagnosis Date  . Anxiety   . Arthritis    fingers  . 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[place])   . Diabetes mellitus without complication ([place])   . Excessive hair growth 07/19/2015  . Hyperlipidemia   . Hypertension   . Mental disorder   . Neuromuscular disorder ([place])    diabetic neuropathy  . Postmenopausal 07/19/2015        Past Surgical History:  Procedure Laterality Date  . APPENDECTOMY    . CARPAL TUNNEL RELEASE    . CESAREAN SECTION    . COLONOSCOPY N/A 08/28/2012   Procedure: COLONOSCOPY;  Surgeon: Rogene Houston, MD;  [facility]: [facility];  Service: Endoscopy;  Laterality: N/A;  915  . COLONOSCOPY N/A 12/25/2017   Procedure: COLONOSCOPY;  Surgeon: Rogene Houston, MD;  Location: [facility];  Service: Endoscopy;  Laterality: N/A;  12:45                   OB History  Gravida Para Term Preterm AB Living  2 2 2     1   SAB TAB Ectopic Multiple Live Births           1 3       # Outcome Date GA Lbr Len/2nd Weight Sex Delivery Anes PTL Lv  2 Term     F CS-Unspec   LIV  1A Term     M CS-Unspec   DEC  1B Term     M CS-Unspec   DEC  Patient denies any other pertinent gynecologic issues.         Current Outpatient Medications on File Prior to Visit  Medication Sig Dispense Refill  . acetaminophen (TYLENOL) 500 MG tablet Take 500 mg by mouth every 6 (six) hours as needed.    Marland Kitchen atorvastatin (LIPITOR) 20 MG tablet Take 20 mg by mouth daily.    Marland Kitchen conjugated estrogens (PREMARIN) vaginal cream Place 6.65 Applicatorfuls  vaginally 3 (three) times a week. For vaginal thinning and irritation in preop timeframe 42.5 g 2  . cyanocobalamin 2000 MCG tablet Take 1,000 mcg by mouth 2 (two) times daily.     Marland Kitchen diltiazem (CARDIZEM) 120 MG tablet Take 120 mg by mouth 2 (two) times daily.    . ferrous sulfate 325 (65 FE) MG tablet Take 325 mg by mouth 2 (two) times daily.     Marland Kitchen gabapentin (NEURONTIN) 100 MG capsule Take 100 mg by mouth 2 (two) times daily.     . hydrochlorothiazide (HYDRODIURIL) 25 MG tablet Take 25 mg by mouth daily.     Marland Kitchen losartan (COZAAR) 50 MG tablet Take 50 mg by mouth daily.    . metFORMIN (GLUCOPHAGE) 1000 MG tablet Take 1,000 mg by mouth 2 (two) times daily with a meal.     No current facility-administered medications on file prior to visit.         Allergies  Allergen Reactions  . Clindamycin/Lincomycin     Mouth taste like metal  . Enalapril Other (See Comments)    Constant Cough    Social History:  reports that she has never smoked. She has never used smokeless tobacco. She reports that she does not drink alcohol or use drugs.  Family History  Problem Relation Age of Onset  . Pneumonia Son   . Suicidality Son   . Diabetes Daughter   . Colon cancer Paternal Aunt     Review of Systems: Noncontributory  PHYSICAL EXAM: Blood pressure (!) 169/74, pulse 67, height 5\' 5"  (1.651 m), weight 148 lb 12.8 oz (67.5 kg). General appearance - alert, well appearing, dizziness upon pelvic exam Chest - clear to auscultation, no wheezes, rales or rhonchi, symmetric air entry Heart - normal rate and regular rhythm Abdomen - soft, nontender, nondistended, no masses or organomegaly  PELVIC Vagina-atrophic, postmenopausal, cystocele  Cervix - atrophic Uterus - 1st degree descensus  Rectal - rectocele noted stool guaiac negative. Anterior wa PAP- done, thin prep brush used results normal. Extremities - peripheral pulses normal, no pedal edema, no clubbing or  cyanosis  Labs: CBC    Component Value Date/Time   WBC 8.2 04/23/2018 1503   RBC 3.69 (L) 04/23/2018 1503   HGB 9.9 (L) 04/23/2018 1503   HCT 31.4 (L) 04/23/2018 1503   PLT 251 04/23/2018 1503   MCV 85.1 04/23/2018 1503   MCH 26.8 04/23/2018 1503   MCHC 31.5 04/23/2018 1503   RDW 14.9 04/23/2018 1503   LYMPHSABS 2.7 04/23/2018 1503   MONOABS 0.9 04/23/2018 1503   EOSABS 0.3 04/23/2018 1503   BASOSABS 0.1 04/23/2018 1503   BMET    Component Value Date/Time   NA 138 04/23/2018 1503   K 3.6 04/23/2018 1503   CL 106 04/23/2018 1503   CO2 23 04/23/2018 1503   GLUCOSE 124 (H) 04/23/2018 1503   BUN 20 04/23/2018 1503   CREATININE 1.17 (H) 04/23/2018 1503   CALCIUM 9.0 04/23/2018 1503   GFRNONAA 47 (L) 04/23/2018 1503   GFRAA 54 (L) 04/23/2018 1503   Hgb A1C  6.8 .  Imaging Studies:  ImagingResults  Mm 3d Screen Breast Bilateral  Result Date: 03/16/2018 CLINICAL DATA:  Screening. EXAM: DIGITAL SCREENING BILATERAL MAMMOGRAM WITH TOMO AND CAD COMPARISON:  Previous exam(s). ACR Breast Density Category c: The breast tissue is heterogeneously dense, which may obscure small masses. FINDINGS: There are no findings suspicious for malignancy. Images were processed with CAD. IMPRESSION: No mammographic evidence of malignancy. A result letter of this screening mammogram will be mailed directly to the patient. RECOMMENDATION: Screening mammogram in one year. (Code:SM-B-01Y) BI-RADS CATEGORY  1: Negative. Electronically Signed   By: Everlean Alstrom M.D.   On: 03/16/2018 12:32     Assessment:     Patient Active Problem List   Diagnosis Date Noted  . Absolute anemia 10/30/2017  . Constipation 06/10/2017  . Hemorrhoids 06/10/2017  . Cystocele with rectocele 06/10/2017  . Rectocele 06/10/2017  . Diabetic foot ulcer ([place]) 09/27/2016  . CKD (chronic kidney disease), stage III ([place]) 09/27/2016  . 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[place]) 09/27/2016  . Hyperlipidemia 09/27/2016   . Hypertension 09/27/2016  . Diabetic ulcer of foot associated with diabetes mellitus due to underlying condition, limited to breakdown of skin ([place]) 09/27/2016  . Postmenopausal 07/19/2015  . Excessive hair growth 07/19/2015  . Essential hypertension, benign 08/11/2012  . High cholesterol 08/11/2012  . Family hx of colon cancer 08/11/2012  . Diabetes ([place]) 08/11/2012   Assessment: 1. Rectocele 2. Cystocele 3. Pap, thin prep normal 4. Anxious chronic hypertension, worsened by pt anxiety   Plan: Patient will  undergo surgical management with  anterior and posterior repair. Will not require hysterectomy, given patient age, activity level, and currently adequate uterine support..   By signing my name below, I, Samul Dada, attest that this documentation has been prepared under the direction and in the presence of Jonnie Kind, MD. Electronically Signed: [place]. 04/13/18. 9:41 AM.  I personally performed the services described in this documentation, which was SCRIBED in my presence. The recorded information has been reviewed and considered accurate. It has been edited as necessary during review. Jonnie Kind, MD

## 2018-04-28 NOTE — Op Note (Signed)
04/28/2018  9:06 AM  PATIENT:  Stacy Webb  73 y.o. female nickname Stacy Webb  PRE-OPERATIVE DIAGNOSIS:  Cystocele,Rectocele  POST-OPERATIVE DIAGNOSIS:  Cystocele,Rectocele  PROCEDURE:  Procedure(s): ANTERIOR (CYSTOCELE) AND POSTERIOR REPAIR (RECTOCELE) (N/A)  SURGEON:  Surgeon(s) and Role:    * Jonnie Kind, MD - Primary  PHYSICIAN ASSISTANT:   ASSISTANTS: Marquita Palms, RNFA  ANESTHESIA:   local and general  EBL:  50 mL   BLOOD ADMINISTERED:none  DRAINS: Urinary Catheter (Foley) and Vaginal packing with Betadine soaked gauze   LOCAL MEDICATIONS USED:  MARCAINE    and Amount: 18 ml  SPECIMEN:  No Specimen  DISPOSITION OF SPECIMEN:  N/A  COUNTS:  YES  TOURNIQUET:  * No tourniquets in log *  DICTATION: .Dragon Dictation  PLAN OF CARE: Admit for overnight observation  PATIENT DISPOSITION:  PACU - hemodynamically stable.   Delay start of Pharmacological VTE agent (>24hrs) due to surgical blood loss or risk of bleeding: not applicable Details of procedure: Patient was taken the operating room prepped and draped for vaginal procedure with legs in standard candycane leg support .,  Perineum was prepped and draped, timeout conducted Ancef administered and procedure confirmed by surgical team during timeout.  The perineum was inspected.  The urethra was well supported.  There was a small cystocele.  The cervix had some descensus but very strong support from the uterosacral ligaments.  A circumferential incision was made at the cervical vaginal fornix from, then a midline incision made over the cystocele.  This extended up to just below the area of good suburethral support.  The anterior cystocele was infiltrated with 8 cc of Marcaine with epinephrine Sharp dissection just beneath the epithelium was performed on each side mobilizing a triangle of tissue over the cystocele, approximately 3 cm in length.  The cystocele was reduced by placing 3 horizontal mattress sutures on  the underside of the vaginal epithelium in such a way as to hold the cystocele upward reduce the anterior defect.  The redundant epithelial tissues were trimmed and then tacked back in place with a inverted T shaped closure. Hemostasis was adequate.  Posterior repair at this point the posterior perineal body was addressed.  Allis clamps were placed at 5 and 7:00 just inside the hymen remnants, and then the rectocele infiltrated with Marcaine solution x10 cc and opened in the midline for a distance approximately 5 cm up the posterior vagina with again sharp dissection peeling the vaginal epithelium off the underlying connective tissues.  There was extensive venous network in the area.  Once dissection was completed double gloved left index finger was placed in the rectum from beneath the vaginal bib in order to confirm the location of the rectum and to allow proper identification of good support tissues laterally on each side that could be pulled into the midline.  Allis clamps were placed on these lateral tissues for orientation.  Index finger was removed from the rectum, gloves changed and then closure performed with a series of interrupted sutures of 0 Vicryl resulting in good perineal support and reduced introitus size.  Posterior support was good with out reducing vaginal diameter significantly.  Vaginal epithelium was trimmed with a 1 cm strip on each side and after the vaginal epithelium was pulled together with a series of running 2-0 Vicryl sutures, vaginal packing was performed with Betadine soaked gauze.  Foley catheter was inserted prior to this, using standard sterile technique and placement. Patient went to the recovery room in stable  condition and will be observed at least until the evening.  Catheter will and packing will be left in x4 hours then removed.

## 2018-04-28 NOTE — Anesthesia Preprocedure Evaluation (Signed)
Anesthesia Evaluation  Patient identified by MRN, date of birth, ID band Patient awake    Reviewed: Allergy & Precautions, H&P , NPO status , Patient's Chart, lab work & pertinent test results, reviewed documented beta blocker date and time   Airway Mallampati: II  TM Distance: >3 FB Neck ROM: full    Dental no notable dental hx.    Pulmonary neg pulmonary ROS,    Pulmonary exam normal breath sounds clear to auscultation       Cardiovascular Exercise Tolerance: Good hypertension, + Valvular Problems/Murmurs  Rhythm:regular Rate:Normal     Neuro/Psych PSYCHIATRIC DISORDERS Anxiety  Neuromuscular disease    GI/Hepatic negative GI ROS, Neg liver ROS,   Endo/Other  negative endocrine ROSdiabetes  Renal/GU Renal disease  negative genitourinary   Musculoskeletal   Abdominal   Peds  Hematology  (+) Blood dyscrasia, anemia ,   Anesthesia Other Findings   Reproductive/Obstetrics negative OB ROS                             Anesthesia Physical Anesthesia Plan  ASA: II  Anesthesia Plan: General   Post-op Pain Management:    Induction:   PONV Risk Score and Plan:   Airway Management Planned:   Additional Equipment:   Intra-op Plan:   Post-operative Plan:   Informed Consent: I have reviewed the patients History and Physical, chart, labs and discussed the procedure including the risks, benefits and alternatives for the proposed anesthesia with the patient or authorized representative who has indicated his/her understanding and acceptance.     Dental Advisory Given  Plan Discussed with: CRNA  Anesthesia Plan Comments:         Anesthesia Quick Evaluation

## 2018-04-28 NOTE — Anesthesia Postprocedure Evaluation (Signed)
Anesthesia Post Note  Patient: Stacy Webb  Procedure(s) Performed: ANTERIOR (CYSTOCELE) AND POSTERIOR REPAIR (RECTOCELE) (N/A )  Patient location during evaluation: PACU Anesthesia Type: General Level of consciousness: awake and alert and patient cooperative Pain management: satisfactory to patient Vital Signs Assessment: post-procedure vital signs reviewed and stable Respiratory status: spontaneous breathing Cardiovascular status: stable Postop Assessment: no apparent nausea or vomiting Anesthetic complications: no     Last Vitals:  Vitals:   04/28/18 0915 04/28/18 0930  BP: (!) 113/43 (!) 109/41  Pulse: 63 (!) 59  Resp: 13 14  Temp:    SpO2: 96% 93%    Last Pain:  Vitals:   04/28/18 0941  PainSc: Asleep                 Hendricks Schwandt

## 2018-04-28 NOTE — Transfer of Care (Signed)
Immediate Anesthesia Transfer of Care Note  Patient: Stacy Webb  Procedure(s) Performed: ANTERIOR (CYSTOCELE) AND POSTERIOR REPAIR (RECTOCELE) (N/A )  Patient Location: PACU  Anesthesia Type:General  Level of Consciousness: awake and alert   Airway & Oxygen Therapy: Patient Spontanous Breathing  Post-op Assessment: Report given to RN  Post vital signs: Reviewed and stable  Last Vitals:  Vitals Value Taken Time  BP 134/49 04/28/2018  8:52 AM  Temp    Pulse 78 04/28/2018  8:55 AM  Resp 16 04/28/2018  8:55 AM  SpO2 98 % 04/28/2018  8:55 AM  Vitals shown include unvalidated device data.  Last Pain: There were no vitals filed for this visit.       Complications: No apparent anesthesia complications

## 2018-04-28 NOTE — Progress Notes (Signed)
Day of Surgery Procedure(s) (LRB): ANTERIOR (CYSTOCELE) AND POSTERIOR REPAIR (RECTOCELE) (N/A)  Subjective: Patient reports tolerating PO and no problems voiding.    Objective: I have reviewed patient's vital signs and intake and output.  General: alert, distracted and no distress anxious about going home, will keep overnight  Assessment: s/p Procedure(s): ANTERIOR (CYSTOCELE) AND POSTERIOR REPAIR (RECTOCELE) (N/A): stable  Plan: Advance diet Advance to PO medication  LOS: 0 days    Stacy Webb 04/28/2018, 5:43 PM

## 2018-04-28 NOTE — Op Note (Signed)
Please see the brief operative note for surgical details 

## 2018-04-28 NOTE — Anesthesia Procedure Notes (Signed)
Procedure Name: LMA Insertion Date/Time: 04/28/2018 7:39 AM Performed by: Ollen Bowl, CRNA Pre-anesthesia Checklist: Patient identified, Patient being monitored, Emergency Drugs available, Timeout performed and Suction available Patient Re-evaluated:Patient Re-evaluated prior to induction Oxygen Delivery Method: Circle System Utilized Preoxygenation: Pre-oxygenation with 100% oxygen Induction Type: IV induction Ventilation: Mask ventilation without difficulty LMA: LMA inserted LMA Size: 3.0 Number of attempts: 1 Placement Confirmation: positive ETCO2 and breath sounds checked- equal and bilateral

## 2018-04-29 ENCOUNTER — Encounter (HOSPITAL_COMMUNITY): Payer: Self-pay | Admitting: Obstetrics and Gynecology

## 2018-04-29 DIAGNOSIS — N816 Rectocele: Secondary | ICD-10-CM | POA: Diagnosis not present

## 2018-04-29 LAB — BASIC METABOLIC PANEL
Anion gap: 7 (ref 5–15)
BUN: 17 mg/dL (ref 8–23)
CHLORIDE: 105 mmol/L (ref 98–111)
CO2: 25 mmol/L (ref 22–32)
Calcium: 8.3 mg/dL — ABNORMAL LOW (ref 8.9–10.3)
Creatinine, Ser: 1.18 mg/dL — ABNORMAL HIGH (ref 0.44–1.00)
GFR calc Af Amer: 54 mL/min — ABNORMAL LOW (ref >60)
GFR calc non Af Amer: 46 mL/min — ABNORMAL LOW (ref >60)
Glucose, Bld: 116 mg/dL — ABNORMAL HIGH (ref 70–99)
Potassium: 4 mmol/L (ref 3.5–5.1)
Sodium: 137 mmol/L (ref 135–145)

## 2018-04-29 LAB — CBC
HCT: 27.1 % — ABNORMAL LOW (ref 36.0–46.0)
HEMOGLOBIN: 8.8 g/dL — AB (ref 12.0–15.0)
MCH: 27.3 pg (ref 26.0–34.0)
MCHC: 32.5 g/dL (ref 30.0–36.0)
MCV: 84.2 fL (ref 80.0–100.0)
Platelets: 234 10*3/uL (ref 150–400)
RBC: 3.22 MIL/uL — AB (ref 3.87–5.11)
RDW: 14.6 % (ref 11.5–15.5)
WBC: 11.5 10*3/uL — ABNORMAL HIGH (ref 4.0–10.5)
nRBC: 0 % (ref 0.0–0.2)

## 2018-04-29 MED ORDER — POLYETHYLENE GLYCOL 3350 17 GM/SCOOP PO POWD: 17.0000 g | Freq: Every day | ORAL | 99 refills | Status: DC

## 2018-04-29 MED ORDER — HYDROCODONE-ACETAMINOPHEN 5-325 MG PO TABS: 1.0000 | ORAL_TABLET | Freq: Four times a day (QID) | ORAL | 0 refills | Status: DC | PRN

## 2018-04-29 NOTE — Progress Notes (Signed)
1 Day Post-Op Procedure(s) (LRB): ANTERIOR (CYSTOCELE) AND POSTERIOR REPAIR (RECTOCELE) (N/A)  Subjective: Patient reports tolerating PO and no problems voiding.  She experienced a moderate amount of bleeding in the  evening hours last night, had a couple small clots she noted in the toilet.  This is improved overnight.  She has been able to void every 2 hours.  Pain is reported as a 0.  She normally can use MiraLAX but only takes a small amount because she is very sensitive to it.  Counseled over bowel management during the 2 weeks after recovery and asked to take MiraLAX as necessary to keep stool soft  Objective: I have reviewed patient's vital signs, intake and output and labs.  General: alert, cooperative, appears stated age and no distress GI: soft, non-tender; bowel sounds normal; no masses,  no organomegaly Vaginal Bleeding: minimal CBC Latest Ref Rng & Units 04/29/2018 04/23/2018 12/25/2017  WBC 4.0 - 10.5 K/uL 11.5(H) 8.2 -  Hemoglobin 12.0 - 15.0 g/dL 8.8(L) 9.9(L) 9.4(L)  Hematocrit 36.0 - 46.0 % 27.1(L) 31.4(L) 28.9(L)  Platelets 150 - 400 K/uL 234 251 -   BMP Latest Ref Rng & Units 04/29/2018 04/23/2018 12/25/2017  Glucose 70 - 99 mg/dL 116(H) 124(H) -  BUN 8 - 23 mg/dL 17 20 -  Creatinine 0.44 - 1.00 mg/dL 1.18(H) 1.17(H) 1.39(H)  Sodium 135 - 145 mmol/L 137 138 -  Potassium 3.5 - 5.1 mmol/L 4.0 3.6 -  Chloride 98 - 111 mmol/L 105 106 -  CO2 22 - 32 mmol/L 25 23 -  Calcium 8.9 - 10.3 mg/dL 8.3(L) 9.0 -    Assessment: s/p Procedure(s): ANTERIOR (CYSTOCELE) AND POSTERIOR REPAIR (RECTOCELE) (N/A): stable  Plan: Encourage ambulation Discharge home  LOS: 0 days    Jonnie Kind 04/29/2018, 7:05 AM

## 2018-04-29 NOTE — Care Management Obs Status (Signed)
[place] NOTIFICATION   Patient Details  Name: Stacy Webb MRN: [id number] Date of Birth: Oct 11, 1945   Medicare Observation Status Notification Given:       Shelda Altes 04/29/2018, 9:04 AM

## 2018-04-29 NOTE — Discharge Summary (Signed)
Physician Discharge Summary  Patient ID: Stacy Webb MRN: [id number] DOB/AGE: December 10, 1945 73 y.o.  Admit date: 04/28/2018 Discharge date: 04/29/2018  Admission Diagnoses:  Discharge Diagnoses:  Active Problems:   Cystocele with rectocele   Discharged Condition: good  Hospital Course: This 73 year old female was admitted through day surgery, underwent anterior and posterior repair with surgery notable for extensive varicosities particularly in the posterior repair.  She was therefore kept overnight.  The vaginal packing was kept in place for 6 hours along with a Foley catheter they were discontinued at 3 PM.  She was able to void every 2 hours through the night.  Pain was listed as a 0.  She did have some small amount of oozing that night but it gradually improved hemoglobin dropped approximately 1 g, as expected for postoperative rehydration she was stable for discharge the following morning  Consults: None  Significant Diagnostic Studies: labs:  CBC Latest Ref Rng & Units 04/29/2018 04/23/2018 12/25/2017  WBC 4.0 - 10.5 K/uL 11.5(H) 8.2 -  Hemoglobin 12.0 - 15.0 g/dL 8.8(L) 9.9(L) 9.4(L)  Hematocrit 36.0 - 46.0 % 27.1(L) 31.4(L) 28.9(L)  Platelets 150 - 400 K/uL 234 251 -     Treatments: surgery: Anterior and posterior repair  Discharge Exam: Blood pressure (!) 149/70, pulse 77, temperature 98.8 F (37.1 C), temperature source Oral, resp. rate 18, height 5\' 5"  (1.651 m), weight 68 kg, SpO2 97 %. General appearance: alert, cooperative and no distress GI: soft, non-tender; bowel sounds normal; no masses,  no organomegaly Extremities: extremities normal, atraumatic, no cyanosis or edema and Homans sign is negative, no sign of DVT  Disposition: Discharge disposition: 01-Home or Self Care       Discharge Instructions    Call MD for:  persistant nausea and vomiting   Complete by:  As directed    Call MD for:  severe uncontrolled pain   Complete by:  As directed    Call MD  for:  temperature >100.4   Complete by:  As directed    Diet - low sodium heart healthy   Complete by:  As directed    Discharge instructions   Complete by:  As directed    Dr. Glo Herring may be reached this weekend on his cell at [phone] for any concerns. Use stool softener as necessary to keep bowel movements the texture of toothpaste, soft. Notify me of any fever, passage of bright red blood per vagina or clots   Increase activity slowly   Complete by:  As directed      Allergies as of 04/29/2018      Reactions   Clindamycin/lincomycin Other (See Comments)   Mouth taste like metal   Enalapril Other (See Comments), Cough   Constant Cough      Medication List    TAKE these medications   acetaminophen 500 MG tablet Commonly known as:  TYLENOL Take 500 mg by mouth every 6 (six) hours as needed for moderate pain or headache.   atorvastatin 20 MG tablet Commonly known as:  LIPITOR Take 20 mg by mouth daily.   conjugated estrogens vaginal cream Commonly known as:  PREMARIN Place 6.38 Applicatorfuls vaginally 3 (three) times a week. For vaginal thinning and irritation in preop timeframe   diltiazem 120 MG tablet Commonly known as:  CARDIZEM Take 120 mg by mouth 2 (two) times daily.   ferrous sulfate 325 (65 FE) MG tablet Take 325 mg by mouth daily with breakfast.   gabapentin 100 MG capsule Commonly known  as:  NEURONTIN Take 100 mg by mouth 2 (two) times daily.   hydrochlorothiazide 25 MG tablet Commonly known as:  HYDRODIURIL Take 25 mg by mouth daily.   HYDROcodone-acetaminophen 5-325 MG tablet Commonly known as:  NORCO/VICODIN Take 1 tablet by mouth every 6 (six) hours as needed for moderate pain. May take with ibuprofen   losartan 50 MG tablet Commonly known as:  COZAAR Take 50 mg by mouth daily.   metFORMIN 1000 MG tablet Commonly known as:  GLUCOPHAGE Take 1,000 mg by mouth 2 (two) times daily with a meal.   polyethylene glycol powder powder Commonly  known as:  MIRALAX Take 17 g by mouth daily. To prevent constipation   vitamin B-12 500 MCG tablet Commonly known as:  CYANOCOBALAMIN Take 500 mcg by mouth 2 (two) times daily.        Signed: Jonnie Kind 04/29/2018, 7:37 AM

## 2018-04-29 NOTE — Discharge Instructions (Signed)
Anterior and Posterior Colporrhaphy, Care After This sheet gives you information about how to care for yourself after your procedure. Your health care provider may also give you more specific instructions. If you have problems or questions, contact your health care provider. What can I expect after the procedure? After the procedure, it is common to have:  Pain in the surgical area.  Vaginal spotting and discharge. You will need to use a sanitary pad during this time.  Fatigue. Follow these instructions at home:  Incision care   Follow instructions from your health care provider about how to take care of your incision. Make sure you: ? Wash your hands with soap and water before touching the incision area. If soap and water are not available, use hand sanitizer. ? Clean your incision as told by your health care provider. ? Leave stitches (sutures), skin glue, or adhesive strips in place. These skin closures may need to stay in place for 2 weeks or longer. If adhesive strip edges start to loosen and curl up, you may trim the loose edges. Do not remove adhesive strips completely unless your health care provider tells you to do that.  Check your incision area every day for signs of infection. Check for: ? Redness, swelling, or pain. ? Fluid or blood. ? Warmth. ? Pus or a bad smell.  Check your incision every day to make sure the incision area is not separating or opening up.  Do not take baths, swim, or use a hot tub until your health care provider approves. You may shower.  Keep the area between your vagina and rectum (perineal area) clean and dry. Make sure you clean the area after every bowel movement and each time you urinate.  Ask your health care provider if you can take a sitz bath or sit in a tub of clean, warm water. Activity  Take frequent, short walks followed by rest periods throughout the day.  Avoid activities that take a lot of effort (are strenuous).  Do not lift  anything that is heavier than 10 lb (4.5 kg), or the limit that your health care provider tells you, until he or she says that it is safe. Avoid pushing or pulling motions.  Avoid standing for long periods of time.  Do not douche, use tampons, or have sex until your health care provider says it is okay.  Return to your normal activities as told by your health care provider. Ask your health care provider what activities are safe for you.  Do not drive until your health care provider approves. To prevent constipation To prevent or treat constipation while you are taking prescription pain medicine, your health care provider may recommend that you:  Take over-the-counter or prescription medicines.  Eat foods that are high in fiber, such as fresh fruits and vegetables, whole grains, and beans.  Drink enough fluid to keep your urine clear or pale yellow.  Limit foods that are high in fat and processed sugars, such as fried and sweet foods. General instructions  Take over-the-counter and prescription medicines only as told by your health care provider.  You may be instructed to do pelvic floor exercises (kegels) as told by your health care provider.  Do not drive or use heavy machinery while taking prescription pain medicine.  Wear compression stockings as told by your health care provider. These stockings help to prevent blood clots and reduce swelling in your legs.  Keep all follow-up visits as told by your health care provider.  This is important. Contact a health care provider if:  Medicine does not help your pain.  You have frequent or urgent urination, or you are unable to completely empty your bladder.  You feel a burning sensation when urinating.  You have pus or a bad smell coming from your vaginal area.  You have redness, swelling, or increasing pain in the vaginal area. Get help right away if:  You have increased bleeding from the vaginal area.  You cannot  urinate.  You have a fever or chills.  Your incision separates or opens.  You have trouble breathing. Summary  After the procedure, it is common to have pain, fatigue, spotting, and discharge from the vagina.  Keep the area between your vagina and rectum (perineal area) clean and dry. Make sure you clean the area after every bowel movement and each time you urinate.  Follow instructions from your health care provider about any activity restrictions after the procedure. This information is not intended to replace advice given to you by your health care provider. Make sure you discuss any questions you have with your health care provider. Document Released: 10/11/2004 Document Revised: 05/02/2016 Document Reviewed: 05/02/2016 Elsevier Interactive Patient Education  2019 [employer].

## 2018-05-06 ENCOUNTER — Ambulatory Visit (INDEPENDENT_AMBULATORY_CARE_PROVIDER_SITE_OTHER): Payer: Medicare Other | Admitting: Obstetrics and Gynecology

## 2018-05-06 ENCOUNTER — Other Ambulatory Visit: Payer: Self-pay

## 2018-05-06 ENCOUNTER — Encounter: Payer: Self-pay | Admitting: Obstetrics and Gynecology

## 2018-05-06 VITALS — BP 139/63 | HR 80 | Ht 66.0 in | Wt 146.0 lb

## 2018-05-06 DIAGNOSIS — Z09 Encounter for follow-up examination after completed treatment for conditions other than malignant neoplasm: Secondary | ICD-10-CM

## 2018-05-06 NOTE — Progress Notes (Signed)
Patient ID: Stacy Webb, female   DOB: [birth year], 73 y.o.   MRN: [id number]  Subjective:  Stacy Webb is a 73 y.o. female now 1 weeks status post ANTERIOR (CYSTOCELE) AND POSTERIOR REPAIR (RECTOCELE).    Is not any pain has not taken anything more than tylenol. She has noticed an improvement in bowel movements. Has not taken any iron since surgery and bowel movements have been soft. Has been using epsom salt when taking a bath to sooth.   Review of Systems Negative except    Diet:   normal   Bowel movements : normal.  Is only taking tylenol for discomfort  Objective:  BP 139/63 (BP Location: Right Arm, Patient Position: Sitting, Cuff Size: Normal)   Pulse 80   Ht 5\' 6"  (1.676 m)   Wt 146 lb (66.2 kg)   BMI 23.57 kg/m  General:Well developed, well nourished.  No acute distress. Abdomen: Bowel sounds normal, soft, non-tender. Pelvic Exam:    External Genitalia:  Normal.    Vagina: Normal, with minimal discharge from healing    Adnexa/Bimanual: Normal   Incision(s): N/A  Assessment:  Post-Op 1 weeks s/p ANTERIOR (CYSTOCELE) AND POSTERIOR REPAIR (RECTOCELE)   Doing well postoperatively.   Plan:  1. Current medications. Tylenol 2. Activity restrictions: none 3. return to work: not applicable. 4. Follow up in 4 weeks.  By signing my name below, I, Samul Dada, attest that this documentation has been prepared under the direction and in the presence of Jonnie Kind, MD. Electronically Signed: [place]. 05/06/18. 9:51 AM.  I personally performed the services described in this documentation, which was SCRIBED in my presence. The recorded information has been reviewed and considered accurate. It has been edited as necessary during review. Jonnie Kind, MD

## 2018-05-08 ENCOUNTER — Telehealth: Payer: Self-pay | Admitting: *Deleted

## 2018-05-21 NOTE — Progress Notes (Signed)
CORRECTED NOTE FROM 09/27/2016: ANTIBIOTIC CONSULT NOTE-Preliminary  Pharmacy Consult for vancomycin Indication: wound infection      Allergies  Allergen Reactions  . Enalapril     Patient Measurements: Height: 5\' 6"  (167.6 cm) Weight: 152 lb (68.9 kg) IBW/kg (Calculated) : 59.3 Adjusted Body Weight:   Vital Signs: Temp: 98.2 F (36.8 C) (06/21 2159) Temp Source: Oral (06/21 2159) BP: 156/69 (06/21 2159) Pulse Rate: 75 (06/21 2159)  Labs:  RecentLabs(last2labs)   Recent Labs  09/27/16 0005  WBC 9.7  HGB 10.0*  PLT 300      CrCl cannot be calculated (Patient's most recent lab result is older than the maximum 21 days allowed.).  RecentLabs(last2labs)  No results for input(s): VANCOTROUGH, VANCOPEAK, VANCORANDOM, GENTTROUGH, GENTPEAK, GENTRANDOM, TOBRATROUGH, TOBRAPEAK, TOBRARND, AMIKACINPEAK, AMIKACINTROU, AMIKACIN in the last 72 hours.                                                            Medications:   (Not in a hospital admission) Scheduled:   Infusions:  . ciprofloxacin    . clindamycin (CLEOCIN) IV 600 mg (09/27/16 0006)  . vancomycin     PRN:            Anti-infectives    Start     Dose/Rate Route Frequency Ordered Stop   09/27/16 0030  vancomycin (VANCOCIN) IVPB 1000 mg/200 mL premix     1,000 mg 200 mL/hr over 60 Minutes Intravenous  Once 09/27/16 0017     09/26/16 2345  ciprofloxacin (CIPRO) IVPB 400 mg     400 mg 200 mL/hr over 60 Minutes Intravenous  Once 09/26/16 2342     09/26/16 2345  clindamycin (CLEOCIN) IVPB 600 mg     600 mg 100 mL/hr over 30 Minutes Intravenous  Once 09/26/16 2342        Assessment: 73 yo female being treated for foot ulcer, starting vancomycin for wound infection.  CrCl=30.8 mL.min Goal of Therapy:  Vancomycin trough level 15-20 mcg/ml  Plan:  Preliminary review of pertinent patient information completed. Protocol will be initiated with dose(s) of  vancomycin 1000 mg IV x 1. Forestine Na clinical pharmacist will complete review during morning rounds to assess patient and finalize treatment regimen if needed.  Nyra Capes, [facility] 09/27/2016,12:28 AM     Electronically signed by Cleon Gustin, [place] at 09/27/2016 12:36 AM Electronically signed by Cleon Gustin, [place] at 09/27/2016 1:32 AM

## 2018-06-03 ENCOUNTER — Encounter: Payer: Self-pay | Admitting: Obstetrics and Gynecology

## 2018-06-03 ENCOUNTER — Other Ambulatory Visit: Payer: Self-pay

## 2018-06-03 ENCOUNTER — Ambulatory Visit (INDEPENDENT_AMBULATORY_CARE_PROVIDER_SITE_OTHER): Payer: Medicare Other | Admitting: Obstetrics and Gynecology

## 2018-06-03 VITALS — BP 117/59 | HR 73 | Ht 66.0 in | Wt 149.0 lb

## 2018-06-03 DIAGNOSIS — Z9889 Other specified postprocedural states: Secondary | ICD-10-CM

## 2018-06-03 DIAGNOSIS — N816 Rectocele: Secondary | ICD-10-CM

## 2018-06-03 DIAGNOSIS — Z09 Encounter for follow-up examination after completed treatment for conditions other than malignant neoplasm: Secondary | ICD-10-CM

## 2018-06-03 DIAGNOSIS — N8110 Cystocele, unspecified: Secondary | ICD-10-CM

## 2018-06-03 NOTE — Progress Notes (Signed)
Patient ID: Stacy Webb, female   DOB: 01-Feb-1946, 73 y.o.   MRN: [id number]   Subjective:  Stacy Webb is a 73 y.o. female now 5 weeks status post ANTERIOR (CYSTOCELE) AND POSTERIOR REPAIR (RECTOCELE). . Has decreased sensation in her feet from diabetes and wears support stocking to help.  Feels "like a different and better person!" Review of Systems Negative except none   Diet:   normal   Bowel movements : normal.  The patient is not having any pain.  Objective:  There were no vitals taken for this visit. General:Well developed, well nourished.  No acute distress. Abdomen: Bowel sounds normal, soft, non-tender. Pelvic Exam:    External Genitalia:  Normal.    Vagina: Normal healing    Adnexa/Bimanual: Normal good support noted on speculum exam light spotting from the anterior repair surgical line from the speculum, but no granulation tissue noted Incision(s): N/A Assessment:  Post-Op 5 weeks s/p ANTERIOR (CYSTOCELE) AND POSTERIOR REPAIR (RECTOCELE).  Doing well postoperatively.   Plan:  1. Activity restrictions: none 2. return to work: not applicable. 3. Follow up PRN 4. Miralax if constipated 5.  No need for future Paps 6.  May continue Premarin vaginal cream twice weekly By signing my name below, I, Samul Dada, attest that this documentation has been prepared under the direction and in the presence of Jonnie Kind, MD. Electronically Signed: [place]. 06/03/18. 9:17 AM.  I personally performed the services described in this documentation, which was SCRIBED in my presence. The recorded information has been reviewed and considered accurate. It has been edited as necessary during review. Jonnie Kind, MD

## 2018-06-03 NOTE — Patient Instructions (Signed)
Thank you , [place],

## 2018-06-10 NOTE — Telephone Encounter (Signed)
Note sent to nurse. 

## 2018-06-22 ENCOUNTER — Encounter (INDEPENDENT_AMBULATORY_CARE_PROVIDER_SITE_OTHER): Payer: Self-pay | Admitting: Internal Medicine

## 2018-07-27 ENCOUNTER — Ambulatory Visit (HOSPITAL_COMMUNITY)
Admission: RE | Admit: 2018-07-27 | Discharge: 2018-07-27 | Disposition: A | Discharge status: Home / Self Care | Payer: Medicare Other | Source: Ambulatory Visit | Attending: Family Medicine | Admitting: Family Medicine

## 2018-07-27 ENCOUNTER — Other Ambulatory Visit: Payer: Self-pay

## 2018-07-27 ENCOUNTER — Other Ambulatory Visit (HOSPITAL_COMMUNITY): Payer: Self-pay | Admitting: Family Medicine

## 2018-07-27 ENCOUNTER — Other Ambulatory Visit: Payer: Self-pay | Admitting: Family Medicine

## 2018-07-27 DIAGNOSIS — M25561 Pain in right knee: Secondary | ICD-10-CM

## 2018-08-02 ENCOUNTER — Encounter: Payer: Self-pay | Admitting: Orthopedic Surgery

## 2018-11-23 ENCOUNTER — Ambulatory Visit
Admission: EM | Admit: 2018-11-23 | Discharge: 2018-11-23 | Disposition: A | Discharge status: Home / Self Care | Payer: Medicare Other | Attending: Emergency Medicine | Admitting: Emergency Medicine

## 2018-11-23 ENCOUNTER — Other Ambulatory Visit: Payer: Self-pay

## 2018-11-23 DIAGNOSIS — W268XXA Contact with other sharp object(s), not elsewhere classified, initial encounter: Secondary | ICD-10-CM | POA: Diagnosis not present

## 2018-11-23 DIAGNOSIS — R030 Elevated blood-pressure reading, without diagnosis of hypertension: Secondary | ICD-10-CM

## 2018-11-23 DIAGNOSIS — E119 Type 2 diabetes mellitus without complications: Secondary | ICD-10-CM | POA: Diagnosis not present

## 2018-11-23 DIAGNOSIS — S81801A Unspecified open wound, right lower leg, initial encounter: Secondary | ICD-10-CM | POA: Diagnosis not present

## 2018-11-23 DIAGNOSIS — Z23 Encounter for immunization: Secondary | ICD-10-CM | POA: Diagnosis not present

## 2018-11-23 MED ORDER — TETANUS-DIPHTH-ACELL PERTUSSIS 5-2.5-18.5 LF-MCG/0.5 IM SUSP
0.5000 mL | Freq: Once | INTRAMUSCULAR | Status: AC
  Administered 2018-11-23: 0.5 mL via INTRAMUSCULAR

## 2018-11-23 MED ORDER — MUPIROCIN 2 % EX OINT: 1.0000 "application " | TOPICAL_OINTMENT | Freq: Two times a day (BID) | CUTANEOUS | 0 refills | Status: DC

## 2018-11-23 NOTE — Discharge Instructions (Addendum)
Tetanus updated Wash with warm water and mild soap Prescribed mupirocin use as directed to help prevent infection Follow up with wound management as needed Return or go to the ED if you have any new or worsening symptoms such as increased pain, redness, swelling, discharge, high fever, night sweats, abdominal pain, etc...   Blood pressure elevated in office.  Please recheck in 24 hours.  If it continues to be greater than 140/90 please follow up with PCP for further evaluation and management.

## 2018-11-23 NOTE — ED Triage Notes (Signed)
Pt has small wound on right lower leg that wont heal, for past 3 weeks

## 2018-11-23 NOTE — ED Provider Notes (Signed)
[facility]   [id number] 11/23/18 Arrival Time: 3419  CC: Wound  SUBJECTIVE:  Stacy Webb is a 73 y.o. female hx significant for char[employer] food, DM, HLD, HTN, and diabetic neuropathy, who presents with a wound to RT LE x 3 weeks.  Symptoms began after she cut her leg on a metal bedframe.  Localizes the wound to RLE.  Describes it as improving, but reports mild burning last night.  Has tried OTC antibiotic cream with relief.  Denies aggravating factors.  Reports similar symptoms in the past with diabetic foot ulcer, and was followed by wound management. Complains of associated scab with healing.  Denies fever, chills, nausea, vomiting, swelling, discharge, SOB, chest pain, abdominal pain, changes in bowel or bladder function.    Has DM, well-controlled.  Last A1C 6.5%  ROS: As per HPI.  All other pertinent ROS negative.     Past Medical History:  Diagnosis Date  . Anemia   . Anxiety   . Arthritis    fingers  . 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[place])   . Diabetes mellitus without complication ([place])   . Excessive hair growth 07/19/2015  . Heart murmur   . History of kidney stones   . Hyperlipidemia   . Hypertension   . Neuromuscular disorder ([place])    diabetic neuropathy  . Neuropathy   . Postmenopausal 07/19/2015   Past Surgical History:  Procedure Laterality Date  . ANTERIOR AND POSTERIOR REPAIR N/A 04/28/2018   Procedure: ANTERIOR (CYSTOCELE) AND POSTERIOR REPAIR (RECTOCELE);  Surgeon: Jonnie Kind, MD;  Location: [facility];  Service: Gynecology;  Laterality: N/A;  . APPENDECTOMY    . CARPAL TUNNEL RELEASE Bilateral   . CESAREAN SECTION    . COLONOSCOPY N/A 08/28/2012   Procedure: COLONOSCOPY;  Surgeon: Rogene Houston, MD;  Location: [facility];  Service: Endoscopy;  Laterality: N/A;  915  . COLONOSCOPY N/A 12/25/2017   Procedure: COLONOSCOPY;  Surgeon: Rogene Houston, MD;  Location: [facility];  Service: Endoscopy;  Laterality: N/A;  12:45    Allergies  Allergen Reactions  . Clindamycin/Lincomycin Other (See Comments)    Mouth taste like metal  . Enalapril Other (See Comments) and Cough    Constant Cough   No current facility-administered medications on file prior to encounter.    Current Outpatient Medications on File Prior to Encounter  Medication Sig Dispense Refill  . acetaminophen (TYLENOL) 500 MG tablet Take 500 mg by mouth every 6 (six) hours as needed for moderate pain or headache.     Marland Kitchen atorvastatin (LIPITOR) 20 MG tablet Take 20 mg by mouth daily.    Marland Kitchen conjugated estrogens (PREMARIN) vaginal cream Place 6.22 Applicatorfuls vaginally 3 (three) times a week. For vaginal thinning and irritation in preop timeframe 42.5 g 2  . diltiazem (CARDIZEM) 120 MG tablet Take 120 mg by mouth 2 (two) times daily.    . ferrous sulfate 325 (65 FE) MG tablet Take 325 mg by mouth daily with breakfast.     . gabapentin (NEURONTIN) 100 MG capsule Take 100 mg by mouth 2 (two) times daily.     . hydrochlorothiazide (HYDRODIURIL) 25 MG tablet Take 25 mg by mouth daily.     Marland Kitchen losartan (COZAAR) 50 MG tablet Take 50 mg by mouth daily.    . metFORMIN (GLUCOPHAGE) 1000 MG tablet Take 1,000 mg by mouth 2 (two) times daily with a meal.    . polyethylene glycol powder (MIRALAX) powder Take 17 g  by mouth daily. To prevent constipation 255 g prn  . vitamin B-12 (CYANOCOBALAMIN) 500 MCG tablet Take 500 mcg by mouth 2 (two) times daily.      Social History   Socioeconomic History  . Marital status: Widowed    Spouse name: Not on file  . Number of children: Not on file  . Years of education: Not on file  . Highest education level: Not on file  Occupational History  . Not on file  Social Needs  . Financial resource strain: Not on file  . Food insecurity    Worry: Not on file    Inability: Not on file  . Transportation needs    Medical: Not on file    Non-medical: Not on file  Tobacco Use  . Smoking status: Never Smoker  . Smokeless  tobacco: Never Used  Substance and Sexual Activity  . Alcohol use: No  . Drug use: No  . Sexual activity: Not Currently    Birth control/protection: Post-menopausal  Lifestyle  . Physical activity    Days per week: Not on file    Minutes per session: Not on file  . Stress: Not on file  Relationships  . Social Herbalist on phone: Not on file    Gets together: Not on file    Attends religious service: Not on file    Active member of club or organization: Not on file    Attends meetings of clubs or organizations: Not on file    Relationship status: Not on file  . Intimate partner violence    Fear of current or ex partner: Not on file    Emotionally abused: Not on file    Physically abused: Not on file    Forced sexual activity: Not on file  Other Topics Concern  . Not on file  Social History Narrative  . Not on file   Family History  Problem Relation Age of Onset  . Pneumonia Son   . Suicidality Son   . Diabetes Daughter   . Colon cancer Paternal Aunt     OBJECTIVE: Vitals:   11/23/18 1104  BP: (!) 187/93  Pulse: 83  Resp: 16  Temp: 97.8 F (36.6 C)  TempSrc: Oral  SpO2: 98%    General appearance: alert; no distress Head: NCAT Lungs: clear to auscultation bilaterally Heart: regular rate and rhythm.  Extremities: no edema Skin: warm and dry; scab (approximately 1 cm in diameter) to lateral RLE with mild surround erythema, no discharge or bleeding, NTTP (see picture below) Psychological: alert and cooperative; normal mood and affect      ASSESSMENT & PLAN:  1. Leg wound, right, initial encounter   2. Elevated blood pressure reading   3. Type 2 diabetes mellitus without complication, without long-term current use of insulin ([place])     Meds ordered this encounter  Medications  . Tdap (BOOSTRIX) injection 0.5 mL  . mupirocin ointment (BACTROBAN) 2 %    Sig: Apply 1 application topically 2 (two) times daily.    Dispense:  22 g    Refill:  0     Order Specific Question:   Supervising Provider    Answer:   Raylene Everts [[id number]]   Tetanus updated Wash with warm water and mild soap Prescribed mupirocin use as directed to help prevent infection Follow up with wound management as needed Return or go to the ED if you have any new or worsening symptoms such as increased pain, redness,  swelling, discharge, high fever, night sweats, abdominal pain, etc...   Blood pressure elevated in office.  Please recheck in 24 hours.  If it continues to be greater than 140/90 please follow up with PCP for further evaluation and management.    Reviewed expectations re: course of current medical issues. Questions answered. Outlined signs and symptoms indicating need for more acute intervention. Patient verbalized understanding. After Visit Summary given.   Lestine Box, PA-C 11/23/18 1139

## 2018-12-24 ENCOUNTER — Other Ambulatory Visit: Payer: Self-pay

## 2018-12-24 ENCOUNTER — Ambulatory Visit (HOSPITAL_COMMUNITY): Payer: Medicare Other | Attending: Family Medicine | Admitting: Physical Therapy

## 2018-12-24 DIAGNOSIS — E08621 Diabetes mellitus due to underlying condition with foot ulcer: Secondary | ICD-10-CM | POA: Diagnosis present

## 2018-12-24 DIAGNOSIS — L97511 Non-pressure chronic ulcer of other part of right foot limited to breakdown of skin: Secondary | ICD-10-CM | POA: Diagnosis present

## 2018-12-24 DIAGNOSIS — R262 Difficulty in walking, not elsewhere classified: Secondary | ICD-10-CM | POA: Diagnosis present

## 2018-12-24 DIAGNOSIS — M79671 Pain in right foot: Secondary | ICD-10-CM | POA: Diagnosis present

## 2018-12-24 NOTE — Therapy (Signed)
[place], [place], [address] Phone: [phone]   Fax:  [phone]  Wound Care Evaluation  Patient Details  Name: Stacy Webb MRN: [id number] Date of Birth: 08-17-1945 No data recorded  Encounter Date: 12/24/2018  PT End of Session - 12/24/18 1403    Visit Number  1    Number of Visits  8    Date for PT Re-Evaluation  01/23/19    Authorization Type  medicare    PT Start Time  1052    PT Stop Time  1130    PT Time Calculation (min)  38 min       Past Medical History:  Diagnosis Date  . Anemia   . Anxiety   . Arthritis    fingers  . 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[place])   . Diabetes mellitus without complication ([place])   . Excessive hair growth 07/19/2015  . Heart murmur   . History of kidney stones   . Hyperlipidemia   . Hypertension   . Neuromuscular disorder ([place])    diabetic neuropathy  . Neuropathy   . Postmenopausal 07/19/2015    Past Surgical History:  Procedure Laterality Date  . ANTERIOR AND POSTERIOR REPAIR N/A 04/28/2018   Procedure: ANTERIOR (CYSTOCELE) AND POSTERIOR REPAIR (RECTOCELE);  Surgeon: Jonnie Kind, MD;  Location: [facility];  Service: Gynecology;  Laterality: N/A;  . APPENDECTOMY    . CARPAL TUNNEL RELEASE Bilateral   . CESAREAN SECTION    . COLONOSCOPY N/A 08/28/2012   Procedure: COLONOSCOPY;  Surgeon: Rogene Houston, MD;  Location: [facility];  Service: Endoscopy;  Laterality: N/A;  915  . COLONOSCOPY N/A 12/25/2017   Procedure: COLONOSCOPY;  Surgeon: Rogene Houston, MD;  Location: [facility];  Service: Endoscopy;  Laterality: N/A;  12:45    There were no vitals filed for this visit.     Wound Therapy - 12/24/18 1325    Subjective  Stacy Webb states that she developed an ulcer on the bottom of her RT foot about two years ago.  Whe went to a podiatrist in [place] who ,"poked it down to the bone" and caused a significant amount of blood to come to the  surface and it has bothered her ever since.  She states that it appears that the wound is going to heal and then she notices drainage on her sock, increase pain and the wound re-opens.  She went to [facility] in January where they really opened it up and irrigated the area and it felt the best it had, however they are out of network with her insurance and she was having to pay out of pocket.  She stoopped going to the wound clinic when she thought she was healed  and now the wound is doing the same thing.  She is having extreme pain when standing on her leg.  She does have diabetic shoes with custom inserts due to her hx of diabetes and Charcot tooth Dz.     Patient and Family Stated Goals  less pain, wound to heal .     Date of Onset  09/24/18    Prior Treatments  ER, podiatrist, wound center.     Evaluation and Treatment Procedures Explained to Patient/Family  Yes    Evaluation and Treatment Procedures  agreed to    Wound Properties Date First Assessed: 12/24/18 Time First Assessed: [id number] Wound Type: Diabetic ulcer Location: Foot Location Orientation:  Right Wound Description (Comments): plantar aspect  Present on Admission: Yes   Dressing Type  None    Dressing Changed  New    Dressing Status  None    Dressing Change Frequency  Daily    Site / Wound Assessment  Dry    Peri-wound Assessment  Other (Comment)   callous:1.3x 1.5 red halo of about .5 cm around callous    Wound Length (cm)  0.3 cm   in the center of the callous   Wound Width (cm)  0.15 cm    Wound Depth (cm)  --   unknown   Wound Surface Area (cm^2)  0.04 cm^2    Drainage Amount  --   none noted by therapist; pt states yellow drainage on socks   Treatment  Cleansed;Debridement (Selective)    Selective Debridement - Location  callousm attempted to open wound     Selective Debridement - Tools Used  Forceps;Scalpel    Selective Debridement - Tissue Removed  callous     Wound Therapy - Clinical Statement  Stacy Webb is a  73 yo female who had a diabetic ulcer on the plantar aspect of his right foot since 09/23/2016.  The wound occured after she mowed her yard in rubber boots causing friction.  She states that the area almost heals but never does.  She has been to podiatrist, [place] and the wound center in [place] .  Due to family members being ill she has been on her feet a lot more than usual causing increased pain and she has noticed increased discharge on her sock.  She has been referred to skilled PT from her primary physician for wound care.     Wound Therapy - Functional Problem List  Pain with standing and walking.     Factors Delaying/Impairing Wound Healing  Altered sensation;Diabetes Mellitus;Other (comment)   charcot-tooth Dz   Hydrotherapy Plan  Debridement;Dressing change;Patient/family education    Wound Therapy - Frequency  2X / week    Wound Therapy - Current Recommendations  PT    Wound Plan  Therapist attempted to debride callous without success, therapist explained to pt to place vaseline on wound/callous area after showering and cover with a bandage everyday until she comes back next week, hopefully at this time the callous will be softened enough that we can debride it.  We will try this for 3 visits if unsuccessful we will refer pt back to the wound center .     Dressing   vaseline/2x2 medipore tape.              Objective measurements completed on examination: See above findings.            PT Education - 12/24/18 1401    Education Details  That we are attempting to soften callous to allow it to debride easier.  Daily shower f/b vaseline, gauze and medipore tape to keep in place.    Person(s) Educated  Patient    Methods  Explanation    Comprehension  Verbalized understanding       PT Short Term Goals - 12/24/18 1421      PT SHORT TERM GOAL #1   Title  PT callous on plantar aspect of RT foot  to be 75% removed.    Time  2    Period  Weeks    Status  New    Target Date   01/07/19      PT SHORT TERM GOAL #2  Title  PT pain in Rt foot  to be no greater than a 5/10 to allow pt toe be on her feet fdor 2 hrs at a time without pain.    Time  2    Period  Weeks    Status  New        PT Long Term Goals - 12/24/18 1419      PT LONG TERM GOAL #1   Title  Callous to be removed and wound to have healed on RT foot    Time  4    Period  Weeks    Status  New    Target Date  01/21/19      PT LONG TERM GOAL #2   Title  Pt pain in Rt foot to be no greater than a 2/10, (pain to be from Charcot not ulcer ), to allow pt to complete daily activities without pain.    Time  4    Period  Weeks    Status  New           Plan - 12/24/18 1413    Clinical Impression Statement  see above    Personal Factors and Comorbidities  Comorbidity 2;Past/Current Experience;Time since onset of injury/illness/exacerbation    Comorbidities  DM, Charcot tooth DX    Examination-Activity Limitations  Locomotion Level;Stand    Examination-Participation Restrictions  Cleaning;Yard Work;Community Activity    Stability/Clinical Decision Making  Stable/Uncomplicated    Clinical Decision Making  Low    Rehab Potential  Fair    PT Frequency  2x / week    PT Duration  4 weeks    PT Treatment/Interventions  Other (comment);Patient/family education   debridment and dressing change.   PT Next Visit Plan  See above       Patient will benefit from skilled therapeutic intervention in order to improve the following deficits and impairments:  Pain, Decreased skin integrity  Visit Diagnosis: Pain in right foot  Diabetic ulcer of right foot associated with diabetes mellitus due to underlying condition, limited to breakdown of skin, unspecified part of foot ([place])  Difficulty in walking, not elsewhere classified    Problem List Patient Active Problem List   Diagnosis Date Noted  . Absolute anemia 10/30/2017  . Constipation 06/10/2017  . Hemorrhoids 06/10/2017  . Cystocele with  rectocele 06/10/2017  . Rectocele 06/10/2017  . Diabetic foot ulcer ([place]) 09/27/2016  . CKD (chronic kidney disease), stage III ([place]) 09/27/2016  . 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[place]) 09/27/2016  . Hyperlipidemia 09/27/2016  . Hypertension 09/27/2016  . Diabetic ulcer of foot associated with diabetes mellitus due to underlying condition, limited to breakdown of skin ([place]) 09/27/2016  . Postmenopausal 07/19/2015  . Essential hypertension, benign 08/11/2012  . High cholesterol 08/11/2012  . Family hx of colon cancer 08/11/2012  . Diabetes [facility]) 08/11/2012    Rayetta Humphrey, PT CLT [phone] 12/24/2018, 2:25 PM  [facility] [place] [address] [place], [place], [address] Phone: [phone]   Fax:  [phone]  Name: Stacy Webb MRN: [id number] Date of Birth: [birth year]

## 2018-12-25 ENCOUNTER — Telehealth (HOSPITAL_COMMUNITY): Payer: Self-pay | Admitting: Physical Therapy

## 2018-12-25 NOTE — Telephone Encounter (Signed)
had to cancel the appt on 01/04/2019 due to the provider will be out of the office. pt already have two appts that week scheduled.

## 2018-12-28 ENCOUNTER — Other Ambulatory Visit: Payer: Self-pay

## 2018-12-28 ENCOUNTER — Ambulatory Visit (HOSPITAL_COMMUNITY): Payer: Medicare Other | Admitting: Physical Therapy

## 2018-12-28 DIAGNOSIS — E08621 Diabetes mellitus due to underlying condition with foot ulcer: Secondary | ICD-10-CM

## 2018-12-28 DIAGNOSIS — M79671 Pain in right foot: Secondary | ICD-10-CM

## 2018-12-28 DIAGNOSIS — R262 Difficulty in walking, not elsewhere classified: Secondary | ICD-10-CM

## 2018-12-28 NOTE — Therapy (Signed)
[place], [place], [address] Phone: [phone]   Fax:  [phone]  Wound Care Therapy  Patient Details  Name: Stacy Webb MRN: [id number] Date of Birth: 07/28/1945 Referring Provider (PT): Leslie Andrea   Encounter Date: 12/28/2018  PT End of Session - 12/28/18 1734    Visit Number  2    Number of Visits  8    Date for PT Re-Evaluation  01/23/19    Authorization Type  medicare    PT Start Time  1618    PT Stop Time  1642    PT Time Calculation (min)  24 min       Past Medical History:  Diagnosis Date  . Anemia   . Anxiety   . Arthritis    fingers  . 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[place])   . Diabetes mellitus without complication ([place])   . Excessive hair growth 07/19/2015  . Heart murmur   . History of kidney stones   . Hyperlipidemia   . Hypertension   . Neuromuscular disorder ([place])    diabetic neuropathy  . Neuropathy   . Postmenopausal 07/19/2015    Past Surgical History:  Procedure Laterality Date  . ANTERIOR AND POSTERIOR REPAIR N/A 04/28/2018   Procedure: ANTERIOR (CYSTOCELE) AND POSTERIOR REPAIR (RECTOCELE);  Surgeon: Jonnie Kind, MD;  Location: [facility];  Service: Gynecology;  Laterality: N/A;  . APPENDECTOMY    . CARPAL TUNNEL RELEASE Bilateral   . CESAREAN SECTION    . COLONOSCOPY N/A 08/28/2012   Procedure: COLONOSCOPY;  Surgeon: Rogene Houston, MD;  Location[facility];  Service: Endoscopy;  Laterality: N/A;  915  . COLONOSCOPY N/A 12/25/2017   Procedure: COLONOSCOPY;  Surgeon: Rogene Houston, MD;  Location[facility];  Service: Endoscopy;  Laterality: N/A;  12:45    There were no vitals filed for this visit.              Wound Therapy - 12/28/18 1732    Subjective  pt states she is due for some new shoes.  No drainage or issues today.  sTates she has kept the bandage on.    Patient and Family Stated Goals  less pain, wound to heal .     Date  of Onset  09/24/18    Prior Treatments  ER, podiatrist, wound center.     Evaluation and Treatment Procedures Explained to Patient/Family  Yes    Evaluation and Treatment Procedures  agreed to    Wound Properties Date First Assessed: 12/24/18 Time First Assessed: [id number] Wound Type: Diabetic ulcer Location: Foot Location Orientation: Right Wound Description (Comments): plantar aspect  Present on Admission: Yes   Dressing Type  None    Dressing Changed  Changed    Dressing Status  None    Dressing Change Frequency  Daily    Site / Wound Assessment  Dry    Peri-wound Assessment  Other (Comment)   callous:1.3x 1.5 red halo of about .5 cm around callous    Drainage Amount  None   none noted by therapist; pt states yellow drainage on socks   Treatment  Cleansed;Debridement (Selective)    Selective Debridement - Location  callous    Selective Debridement - Tools Used  Forceps;Scalpel    Selective Debridement - Tissue Removed  callous     Wound Therapy - Clinical Statement  pt returns today with dressing intact, no drainage or issues.  using  scapel, removed large amount of callous from perimeter.  No opening present following this.  No drainage or area to treat.  Used vaseline, 2x2 and medipore.     Wound Therapy - Functional Problem List  Pain with standing and walking.     Factors Delaying/Impairing Wound Healing  Altered sensation;Diabetes Mellitus;Other (comment)   charcot-tooth Dz   Hydrotherapy Plan  Debridement;Dressing change;Patient/family education    Wound Therapy - Frequency  2X / week    Wound Therapy - Current Recommendations  PT    Wound Plan  continue X 2 more sessions    Dressing   vaseline/2x2 medipore tape.                 PT Short Term Goals - 12/24/18 1421      PT SHORT TERM GOAL #1   Title  PT callous on plantar aspect of RT foot  to be 75% removed.    Time  2    Period  Weeks    Status  New    Target Date  01/07/19      PT SHORT TERM GOAL #2   Title  PT  pain in Rt foot  to be no greater than a 5/10 to allow pt toe be on her feet fdor 2 hrs at a time without pain.    Time  2    Period  Weeks    Status  New        PT Long Term Goals - 12/24/18 1419      PT LONG TERM GOAL #1   Title  Callous to be removed and wound to have healed on RT foot    Time  4    Period  Weeks    Status  New    Target Date  01/21/19      PT LONG TERM GOAL #2   Title  Pt pain in Rt foot to be no greater than a 2/10, (pain to be from Charcot not ulcer ), to allow pt to complete daily activities without pain.    Time  4    Period  Weeks    Status  New              Patient will benefit from skilled therapeutic intervention in order to improve the following deficits and impairments:     Visit Diagnosis: Diabetic ulcer of right foot associated with diabetes mellitus due to underlying condition, limited to breakdown of skin, unspecified part of foot ([place])  Difficulty in walking, not elsewhere classified  Pain in right foot     Problem List Patient Active Problem List   Diagnosis Date Noted  . Absolute anemia 10/30/2017  . Constipation 06/10/2017  . Hemorrhoids 06/10/2017  . Cystocele with rectocele 06/10/2017  . Rectocele 06/10/2017  . Diabetic foot ulcer ([place]) 09/27/2016  . CKD (chronic kidney disease), stage III ([place]) 09/27/2016  . 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[place]) 09/27/2016  . Hyperlipidemia 09/27/2016  . Hypertension 09/27/2016  . Diabetic ulcer of foot associated with diabetes mellitus due to underlying condition, limited to breakdown of skin ([place]) 09/27/2016  . Postmenopausal 07/19/2015  . Essential hypertension, benign 08/11/2012  . High cholesterol 08/11/2012  . Family hx of colon cancer 08/11/2012  . Diabetes ([place]) 08/11/2012   Teena Irani, PTA/CLT [phone]  Teena Irani 12/28/2018, 5:35 PM  [facility] [address] [place], [place], [address] Phone:  [phone]   Fax:  [phone]  Name: LYNANN CORRO MRN: [id number] Date of Birth: December 26, 1945

## 2018-12-30 ENCOUNTER — Encounter (HOSPITAL_COMMUNITY): Payer: Self-pay

## 2018-12-30 ENCOUNTER — Other Ambulatory Visit: Payer: Self-pay

## 2018-12-30 ENCOUNTER — Ambulatory Visit (HOSPITAL_COMMUNITY): Payer: Medicare Other

## 2018-12-30 DIAGNOSIS — E08621 Diabetes mellitus due to underlying condition with foot ulcer: Secondary | ICD-10-CM

## 2018-12-30 DIAGNOSIS — M79671 Pain in right foot: Secondary | ICD-10-CM | POA: Diagnosis not present

## 2018-12-30 DIAGNOSIS — R262 Difficulty in walking, not elsewhere classified: Secondary | ICD-10-CM

## 2018-12-30 NOTE — Therapy (Signed)
[facility] [address] [place], [place], [address] Phone: [phone]   Fax:  [phone]  Wound Care Therapy  Patient Details  Name: Stacy Webb MRN: [id number] Date of Birth: 1945-10-15 Referring Provider (PT): Leslie Andrea   Encounter Date: 12/30/2018  PT End of Session - 12/30/18 1218    Visit Number  3    Number of Visits  8    Date for PT Re-Evaluation  01/23/19    Authorization Type  medicare    PT Start Time  1040    PT Stop Time  1120    PT Time Calculation (min)  40 min    Activity Tolerance  Patient tolerated treatment well    Behavior During Therapy  [facility] for tasks assessed/performed       Past Medical History:  Diagnosis Date  . Anemia   . Anxiety   . Arthritis    fingers  . 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[place])   . Diabetes mellitus without complication ([place])   . Excessive hair growth 07/19/2015  . Heart murmur   . History of kidney stones   . Hyperlipidemia   . Hypertension   . Neuromuscular disorder ([place])    diabetic neuropathy  . Neuropathy   . Postmenopausal 07/19/2015    Past Surgical History:  Procedure Laterality Date  . ANTERIOR AND POSTERIOR REPAIR N/A 04/28/2018   Procedure: ANTERIOR (CYSTOCELE) AND POSTERIOR REPAIR (RECTOCELE);  Surgeon: Jonnie Kind, MD;  Location: [facility];  Service: Gynecology;  Laterality: N/A;  . APPENDECTOMY    . CARPAL TUNNEL RELEASE Bilateral   . CESAREAN SECTION    . COLONOSCOPY N/A 08/28/2012   Procedure: COLONOSCOPY;  Surgeon: Rogene Houston, MD;  Location[facility];  Service: Endoscopy;  Laterality: N/A;  915  . COLONOSCOPY N/A 12/25/2017   Procedure: COLONOSCOPY;  Surgeon: Rogene Houston, MD;  Location[facility];  Service: Endoscopy;  Laterality: N/A;  12:45    There were no vitals filed for this visit.   Subjective Assessment - 12/30/18 1212    Subjective  Pt reports she wore callous pressure relief yesterday to help with weight  bearing.  No reports of pain currently                Wound Therapy - 12/30/18 1213    Subjective  Pt reports she wore callous pressure relief yesterday to help with weight bearing.  No reports of pain currently    Patient and Family Stated Goals  less pain, wound to heal .     Date of Onset  09/24/18    Prior Treatments  ER, podiatrist, wound center.     Evaluation and Treatment Procedures Explained to Patient/Family  Yes    Evaluation and Treatment Procedures  agreed to    Wound Properties Date First Assessed: 12/24/18 Time First Assessed: [id number] Wound Type: Diabetic ulcer Location: Foot Location Orientation: Right Wound Description (Comments): plantar aspect  Present on Admission: Yes   Dressing Type  None   vaseline, 2x2 and medipore tape   Dressing Changed  Changed    Dressing Status  Clean;Dry;Intact    Dressing Change Frequency  Daily    Site / Wound Assessment  Dry    Peri-wound Assessment  --   callous   Drainage Amount  None    Treatment  Cleansed;Debridement (Selective)    Selective Debridement - Location  callous    Selective Debridement - Tools Used  Scalpel    Selective Debridement - Tissue Removed  callous     Wound Therapy - Clinical Statement  Able to remove significant amount of callous all perimeters.  Slight amount of blood in center of wound following debridements.  Continues wiht vaseline, 2x2 and medipore tape.     Wound Therapy - Functional Problem List  Pain with standing and walking.     Factors Delaying/Impairing Wound Healing  Altered sensation;Diabetes Mellitus;Other (comment)   Charcot-tooth Dz   Hydrotherapy Plan  Debridement;Dressing change;Patient/family education    Wound Therapy - Frequency  2X / week    Wound Therapy - Current Recommendations  PT    Wound Plan  Continue wound care per POC.    Dressing   vaseline/2x2 medipore tape.                 PT Short Term Goals - 12/24/18 1421      PT SHORT TERM GOAL #1   Title  PT  callous on plantar aspect of RT foot  to be 75% removed.    Time  2    Period  Weeks    Status  New    Target Date  01/07/19      PT SHORT TERM GOAL #2   Title  PT pain in Rt foot  to be no greater than a 5/10 to allow pt toe be on her feet fdor 2 hrs at a time without pain.    Time  2    Period  Weeks    Status  New        PT Long Term Goals - 12/24/18 1419      PT LONG TERM GOAL #1   Title  Callous to be removed and wound to have healed on RT foot    Time  4    Period  Weeks    Status  New    Target Date  01/21/19      PT LONG TERM GOAL #2   Title  Pt pain in Rt foot to be no greater than a 2/10, (pain to be from Charcot not ulcer ), to allow pt to complete daily activities without pain.    Time  4    Period  Weeks    Status  New              Patient will benefit from skilled therapeutic intervention in order to improve the following deficits and impairments:     Visit Diagnosis: Difficulty in walking, not elsewhere classified  Pain in right foot  Diabetic ulcer of right foot associated with diabetes mellitus due to underlying condition, limited to breakdown of skin, unspecified part of foot [facility])     Problem List Patient Active Problem List   Diagnosis Date Noted  . Absolute anemia 10/30/2017  . Constipation 06/10/2017  . Hemorrhoids 06/10/2017  . Cystocele with rectocele 06/10/2017  . Rectocele 06/10/2017  . Diabetic foot ulcer ([place]) 09/27/2016  . CKD (chronic kidney disease), stage III ([place]) 09/27/2016  . 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[place]) 09/27/2016  . Hyperlipidemia 09/27/2016  . Hypertension 09/27/2016  . Diabetic ulcer of foot associated with diabetes mellitus due to underlying condition, limited to breakdown of skin ([place]) 09/27/2016  . Postmenopausal 07/19/2015  . Essential hypertension, benign 08/11/2012  . High cholesterol 08/11/2012  . Family hx of colon cancer 08/11/2012  . Diabetes ([place]) 08/11/2012   Ihor Austin, [place];  [place]  Aldona Lento 12/30/2018, 12:19  PM  [place], [place], [address] Phone: [phone]   Fax:  [phone]  Name: BETTYE AMMAN MRN: [id number] Date of Birth: February 26, 1946

## 2019-01-04 ENCOUNTER — Ambulatory Visit (HOSPITAL_COMMUNITY): Payer: Medicare Other

## 2019-01-05 ENCOUNTER — Ambulatory Visit (HOSPITAL_COMMUNITY): Payer: Medicare Other | Admitting: Physical Therapy

## 2019-01-05 ENCOUNTER — Encounter (HOSPITAL_COMMUNITY): Payer: Self-pay | Admitting: Physical Therapy

## 2019-01-05 ENCOUNTER — Telehealth (HOSPITAL_COMMUNITY): Payer: Self-pay | Admitting: Physical Therapy

## 2019-01-05 ENCOUNTER — Other Ambulatory Visit: Payer: Self-pay

## 2019-01-05 DIAGNOSIS — R262 Difficulty in walking, not elsewhere classified: Secondary | ICD-10-CM

## 2019-01-05 DIAGNOSIS — M79671 Pain in right foot: Secondary | ICD-10-CM | POA: Diagnosis not present

## 2019-01-05 DIAGNOSIS — E08621 Diabetes mellitus due to underlying condition with foot ulcer: Secondary | ICD-10-CM

## 2019-01-05 NOTE — Telephone Encounter (Signed)
Pt wanted to cancel her appt on 01/06/2019 due to she will be going out of town with her daughter.

## 2019-01-05 NOTE — Therapy (Signed)
[facility] [address] [place], [place], [address] Phone: [phone]   Fax:  [phone]  Wound Care Therapy  Patient Details  Name: Stacy Webb MRN: [id number] Date of Birth: 22-Jul-1945 Referring Provider (PT): Leslie Andrea   Encounter Date: 01/05/2019  PT End of Session - 01/05/19 1309    Visit Number  4    Number of Visits  8    Date for PT Re-Evaluation  01/23/19    Authorization Type  medicare    PT Start Time  1045    PT Stop Time  1120    PT Time Calculation (min)  35 min    Activity Tolerance  Patient tolerated treatment well    Behavior During Therapy  [facility] for tasks assessed/performed       Past Medical History:  Diagnosis Date  . Anemia   . Anxiety   . Arthritis    fingers  . 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[place])   . Diabetes mellitus without complication ([place])   . Excessive hair growth 07/19/2015  . Heart murmur   . History of kidney stones   . Hyperlipidemia   . Hypertension   . Neuromuscular disorder ([place])    diabetic neuropathy  . Neuropathy   . Postmenopausal 07/19/2015    Past Surgical History:  Procedure Laterality Date  . ANTERIOR AND POSTERIOR REPAIR N/A 04/28/2018   Procedure: ANTERIOR (CYSTOCELE) AND POSTERIOR REPAIR (RECTOCELE);  Surgeon: Jonnie Kind, MD;  Location: [facility] OR[facility];  Service: Gynecology;  Laterality: N/A;  . APPENDECTOMY    . CARPAL TUNNEL RELEASE Bilateral   . CESAREAN SECTION    . COLONOSCOPY N/A 08/28/2012   Procedure: COLONOSCOPY;  Surgeon: Rogene Houston, MD;  Location: [facility];  Service: Endoscopy;  Laterality: N/A;  915  . COLONOSCOPY N/A 12/25/2017   Procedure: COLONOSCOPY;  Surgeon: Rogene Houston, MD;  Location: [facility];  Service: Endoscopy;  Laterality: N/A;  12:45    There were no vitals filed for this visit.              Wound Therapy - 01/05/19 1132    Subjective  PT states it no lnger feels like she is walking on rocks.    Patient and Family Stated Goals  less pain, wound to heal .     Date of Onset  09/24/18    Prior Treatments  ER, podiatrist, wound center.     Evaluation and Treatment Procedures Explained to Patient/Family  Yes    Evaluation and Treatment Procedures  agreed to    Wound Properties Date First Assessed: 12/24/18 Time First Assessed: [id number] Wound Type: Diabetic ulcer Location: Foot Location Orientation: Right Wound Description (Comments): plantar aspect  Present on Admission: Yes   Dressing Type  None   vaseline, 2x2 and medipore tape   Dressing Changed  Changed    Dressing Status  Clean;Dry;Intact    Dressing Change Frequency  Daily    Site / Wound Assessment  Dry    Peri-wound Assessment  --   callous   Drainage Amount  None    Treatment  Cleansed;Debridement (Selective)    Selective Debridement - Location  callous    Selective Debridement - Tools Used  Forceps;Scalpel;Scissors    Selective Debridement - Tissue Removed  callous     Wound Therapy - Clinical Statement  Continued with debridement of callous this session.  Therapist recommended a pumice stone for possible self care.  No  opening noted today.  At this time therapist feels that next treatment can be pt last.      Wound Therapy - Functional Problem List  Pain with standing and walking.     Factors Delaying/Impairing Wound Healing  Altered sensation;Diabetes Mellitus;Other (comment)   Charcot-tooth Dz   Hydrotherapy Plan  Debridement;Dressing change;Patient/family education    Wound Therapy - Frequency  2X / week    Wound Therapy - Current Recommendations  PT    Wound Plan  Continue wound care possible discharge next treatment.    Dressing   vaseline/2x2 medipore tape.                 PT Short Term Goals - 12/24/18 1421      PT SHORT TERM GOAL #1   Title  PT callous on plantar aspect of RT foot  to be 75% removed.    Time  2    Period  Weeks    Status  New    Target Date  01/07/19      PT SHORT TERM GOAL #2    Title  PT pain in Rt foot  to be no greater than a 5/10 to allow pt toe be on her feet fdor 2 hrs at a time without pain.    Time  2    Period  Weeks    Status  ongoing        PT Long Term Goals - 12/24/18 1419      PT LONG TERM GOAL #1   Title  Callous to be removed and wound to have healed on RT foot    Time  4    Period  Weeks    Status On going    Target Date  01/21/19      PT LONG TERM GOAL #2   Title  Pt pain in Rt foot to be no greater than a 2/10, (pain to be from Charcot not ulcer ), to allow pt to complete daily activities without pain.    Time  4    Period  Weeks    Status On going            Plan - 01/05/19 1310    Clinical Impression Statement  see above    Personal Factors and Comorbidities  Comorbidity 2;Past/Current Experience;Time since onset of injury/illness/exacerbation    Comorbidities  DM, Charcot tooth DX    Examination-Activity Limitations  Locomotion Level;Stand    Examination-Participation Restrictions  Cleaning;Yard Work;Community Activity    Stability/Clinical Decision Making  Stable/Uncomplicated    Rehab Potential  Fair    PT Frequency  2x / week    PT Duration  4 weeks    PT Treatment/Interventions  Other (comment);Patient/family education   debridment and dressing change.   PT Next Visit Plan  See above       Patient will benefit from skilled therapeutic intervention in order to improve the following deficits and impairments:  Pain, Decreased skin integrity  Visit Diagnosis: Difficulty in walking, not elsewhere classified  Pain in right foot  Diabetic ulcer of right foot associated with diabetes mellitus due to underlying condition, limited to breakdown of skin, unspecified part of foot [facility])     Problem List Patient Active Problem List   Diagnosis Date Noted  . Absolute anemia 10/30/2017  . Constipation 06/10/2017  . Hemorrhoids 06/10/2017  . Cystocele with rectocele 06/10/2017  . Rectocele 06/10/2017  . Diabetic foot  ulcer ([place]) 09/27/2016  . CKD (chronic  kidney disease), stage III ([place]) 09/27/2016  . 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[place]) 09/27/2016  . Hyperlipidemia 09/27/2016  . Hypertension 09/27/2016  . Diabetic ulcer of foot associated with diabetes mellitus due to underlying condition, limited to breakdown of skin ([place]) 09/27/2016  . Postmenopausal 07/19/2015  . Essential hypertension, benign 08/11/2012  . High cholesterol 08/11/2012  . Family hx of colon cancer 08/11/2012  . Diabetes [facility]) 08/11/2012    Rayetta Humphrey, PT CLT [phone] 01/05/2019, 1:11 PM  [facility] [address] [place], [place], [address] Phone: [phone]   Fax:  [phone]  Name: Stacy Webb MRN: [id number] Date of Birth: 29-Apr-1945

## 2019-01-06 ENCOUNTER — Ambulatory Visit (HOSPITAL_COMMUNITY): Payer: Medicare Other

## 2019-01-11 ENCOUNTER — Telehealth (HOSPITAL_COMMUNITY): Payer: Self-pay | Admitting: Physical Therapy

## 2019-01-11 NOTE — Telephone Encounter (Signed)
Her daughter has two apptments tomorrow and her son-in-law is sick -Pt thinks she can be here Thursday. She will let us know

## 2019-01-12 ENCOUNTER — Other Ambulatory Visit: Payer: Self-pay

## 2019-01-12 ENCOUNTER — Ambulatory Visit (HOSPITAL_COMMUNITY): Payer: Medicare Other | Admitting: Physical Therapy

## 2019-01-12 ENCOUNTER — Ambulatory Visit (HOSPITAL_COMMUNITY): Payer: Medicare Other | Attending: Family Medicine | Admitting: Physical Therapy

## 2019-01-12 DIAGNOSIS — R262 Difficulty in walking, not elsewhere classified: Secondary | ICD-10-CM | POA: Insufficient documentation

## 2019-01-12 DIAGNOSIS — E08621 Diabetes mellitus due to underlying condition with foot ulcer: Secondary | ICD-10-CM | POA: Insufficient documentation

## 2019-01-12 DIAGNOSIS — M79671 Pain in right foot: Secondary | ICD-10-CM | POA: Insufficient documentation

## 2019-01-12 DIAGNOSIS — L97511 Non-pressure chronic ulcer of other part of right foot limited to breakdown of skin: Secondary | ICD-10-CM | POA: Insufficient documentation

## 2019-01-12 NOTE — Therapy (Signed)
[place], [place], [address] Phone: [phone]   Fax:  [phone]  Wound Care Therapy  Patient Details  Name: Stacy Webb MRN: [id number] Date of Birth: Nov 12, 1945 Referring Provider (PT): Leslie Andrea  PHYSICAL THERAPY DISCHARGE SUMMARY  Visits from Start of Care: 5  Current functional level related to goals / functional outcomes: PT able to walk in comfort    Remaining deficits: none   Education / Equipment: Obtain new molds for feet every year due to hx of Charcot-tooth Dz Plan: Patient agrees to discharge.  Patient goals were not met. Patient is being discharged due to meeting the stated rehab goals.  ?????     Encounter Date: 01/12/2019  PT End of Session - 01/12/19 1154    Visit Number  5    Number of Visits  5    Date for PT Re-Evaluation  01/23/19    Authorization Type  medicare    PT Start Time  1140    PT Stop Time  1148    PT Time Calculation (min)  8 min    Activity Tolerance  Patient tolerated treatment well    Behavior During Therapy  WFL for tasks assessed/performed       Past Medical History:  Diagnosis Date  . Anemia   . Anxiety   . Arthritis    fingers  . 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[place])   . Diabetes mellitus without complication ([place])   . Excessive hair growth 07/19/2015  . Heart murmur   . History of kidney stones   . Hyperlipidemia   . Hypertension   . Neuromuscular disorder ([place])    diabetic neuropathy  . Neuropathy   . Postmenopausal 07/19/2015    Past Surgical History:  Procedure Laterality Date  . ANTERIOR AND POSTERIOR REPAIR N/A 04/28/2018   Procedure: ANTERIOR (CYSTOCELE) AND POSTERIOR REPAIR (RECTOCELE);  Surgeon: Jonnie Kind, MD;  Location: [facility];  Service: Gynecology;  Laterality: N/A;  . APPENDECTOMY    . CARPAL TUNNEL RELEASE Bilateral   . CESAREAN SECTION    . COLONOSCOPY N/A 08/28/2012   Procedure: COLONOSCOPY;  Surgeon:  Rogene Houston, MD;  Location[facility];  Service: Endoscopy;  Laterality: N/A;  915  . COLONOSCOPY N/A 12/25/2017   Procedure: COLONOSCOPY;  Surgeon: Rogene Houston, MD;  Location: [facility];  Service: Endoscopy;  Laterality: N/A;  12:45    There were no vitals filed for this visit.              Wound Therapy - 01/12/19 1152    Subjective  Pt states that she is not having any pain any longer.     Wound Properties Date First Assessed: 12/24/18 Time First Assessed: 7342 Wound Type: Diabetic ulcer Location: Foot Location Orientation: Right Wound Description (Comments): plantar aspect  Present on Admission: Yes   Treatment  Other (Comment)   No treatment needed there is no longer any callous or wound.   Wound Therapy - Clinical Statement  Upon inspection of wound it is noted that wound has closed and there is no longer any callous     Wound Plan  Discharge as goals have been met                PT Short Term Goals - 01/12/19 1155      PT SHORT TERM GOAL #1   Title  PT callous on plantar aspect of RT foot  to be 75% removed.    Time  2    Period  Weeks    Status  Achieved    Target Date  01/07/19      PT SHORT TERM GOAL #2   Title  PT pain in Rt foot  to be no greater than a 5/10 to allow pt toe be on her feet fdor 2 hrs at a time without pain.    Time  2    Period  Weeks    Status  Achieved        PT Long Term Goals - 01/12/19 1155      PT LONG TERM GOAL #1   Title  Callous to be removed and wound to have healed on RT foot    Time  4    Period  Weeks    Status  Achieved      PT LONG TERM GOAL #2   Title  Pt pain in Rt foot to be no greater than a 2/10, (pain to be from Charcot not ulcer ), to allow pt to complete daily activities without pain.    Time  4    Period  Weeks    Status  Achieved            Plan - 01/12/19 1154    Clinical Impression Statement  wound healed/callous removed    Personal Factors and Comorbidities   Comorbidity 2;Past/Current Experience;Time since onset of injury/illness/exacerbation    Comorbidities  DM, Charcot tooth DX    Examination-Activity Limitations  Locomotion Level;Stand    Examination-Participation Restrictions  Cleaning;Yard Work;Community Activity    Stability/Clinical Decision Making  Stable/Uncomplicated    Rehab Potential  Fair    PT Frequency  2x / week    PT Duration  4 weeks    PT Treatment/Interventions  Other (comment);Patient/family education   debridment and dressing change.   PT Next Visit Plan  Discharge.       Patient will benefit from skilled therapeutic intervention in order to improve the following deficits and impairments:  Pain, Decreased skin integrity  Visit Diagnosis: Difficulty in walking, not elsewhere classified  Pain in right foot  Diabetic ulcer of right foot associated with diabetes mellitus due to underlying condition, limited to breakdown of skin, unspecified part of foot [facility])     Problem List Patient Active Problem List   Diagnosis Date Noted  . Absolute anemia 10/30/2017  . Constipation 06/10/2017  . Hemorrhoids 06/10/2017  . Cystocele with rectocele 06/10/2017  . Rectocele 06/10/2017  . Diabetic foot ulcer ([place]) 09/27/2016  . CKD (chronic kidney disease), stage III 09/27/2016  . 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[place]) 09/27/2016  . Hyperlipidemia 09/27/2016  . Hypertension 09/27/2016  . Diabetic ulcer of foot associated with diabetes mellitus due to underlying condition, limited to breakdown of skin ([place]) 09/27/2016  . Postmenopausal 07/19/2015  . Essential hypertension, benign 08/11/2012  . High cholesterol 08/11/2012  . Family hx of colon cancer 08/11/2012  . Diabetes [facility]) 08/11/2012    Rayetta Humphrey, PT CLT [phone] 01/12/2019, 11:56 AM  [place], [place], [address] Phone: [phone]   Fax:  [phone]  Name: Stacy Webb MRN: [id number] Date of Birth: 04-25-1945

## 2019-01-14 ENCOUNTER — Ambulatory Visit (HOSPITAL_COMMUNITY): Payer: Medicare Other | Admitting: Physical Therapy

## 2019-01-19 ENCOUNTER — Ambulatory Visit (HOSPITAL_COMMUNITY): Payer: Medicare Other | Admitting: Physical Therapy

## 2019-01-21 ENCOUNTER — Ambulatory Visit: Payer: Medicare Other | Admitting: Orthotics

## 2019-01-21 ENCOUNTER — Other Ambulatory Visit: Payer: Self-pay

## 2019-01-21 ENCOUNTER — Ambulatory Visit (HOSPITAL_COMMUNITY): Payer: Medicare Other | Admitting: Physical Therapy

## 2019-01-21 ENCOUNTER — Ambulatory Visit (INDEPENDENT_AMBULATORY_CARE_PROVIDER_SITE_OTHER): Payer: Medicare Other | Admitting: Podiatry

## 2019-01-21 DIAGNOSIS — E1169 Type 2 diabetes mellitus with other specified complication: Secondary | ICD-10-CM

## 2019-01-21 DIAGNOSIS — M2041 Other hammer toe(s) (acquired), right foot: Secondary | ICD-10-CM | POA: Diagnosis not present

## 2019-01-21 DIAGNOSIS — E1142 Type 2 diabetes mellitus with diabetic polyneuropathy: Secondary | ICD-10-CM | POA: Diagnosis not present

## 2019-01-21 DIAGNOSIS — B351 Tinea unguium: Secondary | ICD-10-CM | POA: Diagnosis not present

## 2019-01-21 DIAGNOSIS — M2042 Other hammer toe(s) (acquired), left foot: Secondary | ICD-10-CM

## 2019-01-21 DIAGNOSIS — E11610 Type 2 diabetes mellitus with diabetic neuropathic arthropathy: Secondary | ICD-10-CM

## 2019-01-21 NOTE — Progress Notes (Signed)
Going to see [name] in two weeks.  She has been wearing custom DBS w/o proper inserts; gave her DBS inserts to put I shoes.  Going to determine if she needs a repeast of same shoe from [employer]

## 2019-01-26 ENCOUNTER — Ambulatory Visit (HOSPITAL_COMMUNITY): Payer: Medicare Other | Admitting: Physical Therapy

## 2019-01-28 ENCOUNTER — Ambulatory Visit (HOSPITAL_COMMUNITY): Payer: Medicare Other | Admitting: Physical Therapy

## 2019-02-02 ENCOUNTER — Ambulatory Visit (HOSPITAL_COMMUNITY): Payer: Medicare Other | Admitting: Physical Therapy

## 2019-02-04 ENCOUNTER — Ambulatory Visit (HOSPITAL_COMMUNITY): Payer: Medicare Other | Admitting: Physical Therapy

## 2019-02-04 ENCOUNTER — Other Ambulatory Visit: Payer: Medicare Other | Admitting: Orthotics

## 2019-02-10 ENCOUNTER — Other Ambulatory Visit: Payer: Self-pay

## 2019-02-10 ENCOUNTER — Other Ambulatory Visit: Payer: Medicare Other | Admitting: Orthotics

## 2019-02-17 ENCOUNTER — Other Ambulatory Visit (HOSPITAL_COMMUNITY): Payer: Self-pay | Admitting: Family Medicine

## 2019-02-17 DIAGNOSIS — Z1231 Encounter for screening mammogram for malignant neoplasm of breast: Secondary | ICD-10-CM

## 2019-03-22 ENCOUNTER — Ambulatory Visit (HOSPITAL_COMMUNITY): Payer: Medicare Other

## 2019-03-26 ENCOUNTER — Telehealth: Payer: Self-pay | Admitting: Podiatry

## 2019-03-26 NOTE — Telephone Encounter (Signed)
Pt left message checking on custom shoes that were ordered for her. Asked for [place] to leave a message if she did not answer.  I returned call and left message that we are waiting on Dr Karie Kirks to dign off on the diabetic shoe paperwork that we have faxed multiple times electronically and I also hand faxed today. If pt wants to maybe call that office and see what the hold up is or call me if any additional questions.

## 2019-04-05 ENCOUNTER — Ambulatory Visit
Admission: EM | Admit: 2019-04-05 | Discharge: 2019-04-05 | Disposition: A | Discharge status: Home / Self Care | Payer: Medicare Other | Attending: Emergency Medicine | Admitting: Emergency Medicine

## 2019-04-05 ENCOUNTER — Other Ambulatory Visit: Payer: Self-pay

## 2019-04-05 DIAGNOSIS — L84 Corns and callosities: Secondary | ICD-10-CM

## 2019-04-05 MED ORDER — CEPHALEXIN 500 MG PO CAPS: 500.0000 mg | ORAL_CAPSULE | Freq: Four times a day (QID) | ORAL | 0 refills | Status: DC

## 2019-04-05 NOTE — Discharge Instructions (Addendum)
Advised advise patient to follow-up with podiatry Advised patient to continue to apply Silvadene as prescribed by podiatry Advised patient to complete antibiotic course To return for worsening of symptoms

## 2019-04-05 NOTE — ED Triage Notes (Signed)
Pt presents to UC w/ c/o diabetic ulcer on left great toe x2 months that has gotten progressively worse. Pt states it has flared up and gotten better after taking antibiotic. Pt states yesterday it burst w/ puss and then bleeding.

## 2019-04-05 NOTE — ED Provider Notes (Signed)
[facility]    CSN: [id number] Arrival date & time: 04/05/19  1050      History   Chief Complaint Chief Complaint  Patient presents with  . foot ulcer    HPI Stacy Webb is a 73 y.o. female.   Stacy Webb 73 years old female present to the urgent care with a complaint of left great toe callus for the past 28-month.  It develop gradually and localized to the left great toe.  Patient stated there was a yellow discharge this morning.  She has an appointment with podiatrist on April 11, 2018 but was unable to see the podiatrist today.  She describes the pain as constant and achy and rated at 4 on a scale of 1-10. Marland Kitchen He has tried OTC  medications without relief.  Her symptoms are made worse with movement and range of motion.  She denies similar symptoms in the past.      Past Medical History:  Diagnosis Date  . Anemia   . Anxiety   . Arthritis    fingers  . 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[place])   . Diabetes mellitus without complication ([place])   . Excessive hair growth 07/19/2015  . Heart murmur   . History of kidney stones   . Hyperlipidemia   . Hypertension   . Neuromuscular disorder ([place])    diabetic neuropathy  . Neuropathy   . Postmenopausal 07/19/2015    Patient Active Problem List   Diagnosis Date Noted  . Absolute anemia 10/30/2017  . Constipation 06/10/2017  . Hemorrhoids 06/10/2017  . Cystocele with rectocele 06/10/2017  . Rectocele 06/10/2017  . Diabetic foot ulcer ([place]) 09/27/2016  . CKD (chronic kidney disease), stage III 09/27/2016  . 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[place]) 09/27/2016  . Hyperlipidemia 09/27/2016  . Hypertension 09/27/2016  . Diabetic ulcer of foot associated with diabetes mellitus due to underlying condition, limited to breakdown of skin ([place]) 09/27/2016  . Postmenopausal 07/19/2015  . Essential hypertension, benign 08/11/2012  . High cholesterol 08/11/2012  . Family hx of colon cancer 08/11/2012  .  Diabetes ([place]) 08/11/2012    Past Surgical History:  Procedure Laterality Date  . ANTERIOR AND POSTERIOR REPAIR N/A 04/28/2018   Procedure: ANTERIOR (CYSTOCELE) AND POSTERIOR REPAIR (RECTOCELE);  Surgeon: Jonnie Kind, MD;  Location: [facility];  Service: Gynecology;  Laterality: N/A;  . APPENDECTOMY    . CARPAL TUNNEL RELEASE Bilateral   . CESAREAN SECTION    . COLONOSCOPY N/A 08/28/2012   Procedure: COLONOSCOPY;  Surgeon: Rogene Houston, MD;  Location[facility];  Service: Endoscopy;  Laterality: N/A;  915  . COLONOSCOPY N/A 12/25/2017   Procedure: COLONOSCOPY;  Surgeon: Rogene Houston, MD;  [facility];  Service: Endoscopy;  Laterality: N/A;  12:45    OB History    Gravida  2   Para  2   Term  2   Preterm      AB      Living  1     SAB      TAB      Ectopic      Multiple  1   Live Births  3            Home Medications    Prior to Admission medications   Medication Sig Start Date End Date Taking? Authorizing Provider  ACCU-CHEK AVIVA PLUS test strip  12/05/18   [provider]  acetaminophen (TYLENOL) 500  MG tablet Take 500 mg by mouth every 6 (six) hours as needed for moderate pain or headache.     [provider]  atorvastatin (LIPITOR) 20 MG tablet Take 20 mg by mouth daily.    [provider]  cephALEXin (KEFLEX) 500 MG capsule Take 1 capsule (500 mg total) by mouth 4 (four) times daily. 04/05/19   Deon Ivey, Darrelyn Hillock, FNP  conjugated estrogens (PREMARIN) vaginal cream Place [id number] Applicatorfuls vaginally 3 (three) times a week. For vaginal thinning and irritation in preop timeframe 03/16/18   Jonnie Kind, MD  diltiazem (CARDIZEM LA) 240 MG 24 hr tablet diltiazem ER 240 mg tablet,extended release 24 hr    [provider]  diltiazem (CARDIZEM) 120 MG tablet Take 120 mg by mouth 2 (two) times daily.    [provider]  ferrous sulfate 325 (65 FE) MG tablet Take 325 mg by mouth daily with  breakfast.     [provider]  gabapentin (NEURONTIN) 100 MG capsule Take 100 mg by mouth 2 (two) times daily.     [provider]  hydrochlorothiazide (HYDRODIURIL) 25 MG tablet Take 25 mg by mouth daily.  01/07/18   [provider]  HYDROcodone-acetaminophen (NORCO/VICODIN) 5-325 MG tablet hydrocodone 5 mg-acetaminophen 325 mg tablet    [provider]  losartan (COZAAR) 50 MG tablet Take 50 mg by mouth daily.    [provider]  metFORMIN (GLUCOPHAGE) 1000 MG tablet Take 1,000 mg by mouth 2 (two) times daily with a meal.    [provider]  mupirocin ointment (BACTROBAN) 2 % Apply 1 application topically 2 (two) times daily. 11/23/18   [place], [place], PA-C  polyethylene glycol powder (MIRALAX) powder Take 17 g by mouth daily. To prevent constipation 04/29/18   Jonnie Kind, MD  vitamin B-12 (CYANOCOBALAMIN) 500 MCG tablet Take 500 mcg by mouth 2 (two) times daily.     [provider]    Family History Family History  Problem Relation Age of Onset  . Pneumonia Son   . Suicidality Son   . Diabetes Daughter   . Colon cancer Paternal Aunt     Social History Social History   Tobacco Use  . Smoking status: Never Smoker  . Smokeless tobacco: Never Used  Substance Use Topics  . Alcohol use: No  . Drug use: No     Allergies   Clindamycin/lincomycin and Enalapril   Review of Systems Review of Systems  Constitutional: Negative.   Respiratory: Negative.   Cardiovascular: Negative.   Musculoskeletal: Negative.        Toe pain  ROS: All other are negatives  Physical Exam Triage Vital Signs ED Triage Vitals [04/05/19 1058]  Enc Vitals Group     BP (!) 190/86     Pulse Rate 69     Resp 15     Temp 98.2 F (36.8 C)     Temp Source Oral     SpO2 98 %     Weight      Height      Head Circumference      Peak Flow      Pain Score      Pain Loc      Pain Edu?      Excl. in [place]?    No data found.  Updated  Vital Signs BP (!) 190/86 (BP Location: Right Arm)   Pulse 69   Temp 98.2 F (36.8 C) (Oral)   Resp 15  SpO2 98%   Visual Acuity Right Eye Distance:   Left Eye Distance:   Bilateral Distance:    Right Eye Near:   Left Eye Near:    Bilateral Near:     Physical Exam Constitutional:      General: She is not in acute distress.    Appearance: Normal appearance. She is not ill-appearing or toxic-appearing.  Cardiovascular:     Rate and Rhythm: Normal rate.     Pulses: Normal pulses.     Heart sounds: Normal heart sounds.  Pulmonary:     Effort: Pulmonary effort is normal. No respiratory distress.     Breath sounds: Normal breath sounds. No rhonchi.  Musculoskeletal:        General: No swelling or tenderness.     Left foot: Swelling present. No bony tenderness. Normal pulse.     Comments: Pain on range of motion present, edema lower extremities 1+, callus present on the left big toe  Skin:    General: Skin is warm and dry.     Comments: Left great toe: skin color change  Neurological:     Mental Status: She is alert.        UC Treatments / Results  Labs (all labs ordered are listed, but only abnormal results are displayed) Labs Reviewed - No data to display  EKG   Radiology No results found.  Procedures Procedures (including critical care time)  Medications Ordered in UC Medications - No data to display  Initial Impression / Assessment and Plan / UC Course  I have reviewed the triage vital signs and the nursing notes.  Pertinent labs & imaging results that were available during my care of the patient were reviewed by me and considered in my medical decision making (see chart for details).    Patient stable for discharge.  Advised patient to continue to use Silvadene as prescribed by podiatrist.  Antibiotic was prescribed to prevent infection.  To return for worsening of symptoms.  Patient verbalized understanding of the plan of care  Final Clinical  Impressions(s) / UC Diagnoses   Final diagnoses:  Foot callus     Discharge Instructions     Advised advise patient to follow-up with podiatry Advised patient to continue to apply Silvadene as prescribed by podiatry Advised patient to complete antibiotic course To return for worsening of symptoms    ED Prescriptions    Medication Sig Dispense Auth. Provider   cephALEXin (KEFLEX) 500 MG capsule Take 1 capsule (500 mg total) by mouth 4 (four) times daily. 20 capsule Ermel Verne, Darrelyn Hillock, FNP     PDMP not reviewed this encounter.   Emerson Monte, [place] 04/05/19 1208

## 2019-04-06 ENCOUNTER — Telehealth: Payer: Self-pay | Admitting: Podiatry

## 2019-04-06 NOTE — Telephone Encounter (Signed)
Pt left message yesterday asking if she could pick up the paperwork for her diabetic shoes and take to the pcp and brig back signed to our office. She also asked for medical records and is wanting her office visit note from 10.15.2020 from Dr March Rummage.   I called her home an left message with a gentleman that paperwork could be picked up at front desk.  I also left message for pt on cell to call me back.

## 2019-04-07 ENCOUNTER — Other Ambulatory Visit: Payer: Self-pay

## 2019-04-07 ENCOUNTER — Ambulatory Visit (HOSPITAL_COMMUNITY)
Admission: RE | Admit: 2019-04-07 | Discharge: 2019-04-07 | Disposition: A | Discharge status: Home / Self Care | Payer: Medicare Other | Source: Ambulatory Visit | Attending: Family Medicine | Admitting: Family Medicine

## 2019-04-07 DIAGNOSIS — Z1231 Encounter for screening mammogram for malignant neoplasm of breast: Secondary | ICD-10-CM | POA: Insufficient documentation

## 2019-04-11 NOTE — Progress Notes (Signed)
Subjective:  Patient ID: Stacy Webb, female    DOB: 12-Jan-1946,  MRN: [id number]  Chief Complaint  Patient presents with  . Nail Problem    Pt wants to discuss treatment for nail fungus  . Callouses    Bilateral plantar callouses  . Nail Problem    Nail trim 1-5 bilateral  . Diabetes Mellitus    Pt would like updated diabetic shoes.    74 y.o. female presents  for diabetic foot care. Would like new DM shoes. Requests care of her nails today. Hx of DM. Last A1c 6.8.   Review of Systems: Negative except as noted in the HPI. Denies N/V/F/Ch.  Past Medical History:  Diagnosis Date  . Anemia   . Anxiety   . Arthritis    fingers  . 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[place])   . Diabetes mellitus without complication ([place])   . Excessive hair growth 07/19/2015  . Heart murmur   . History of kidney stones   . Hyperlipidemia   . Hypertension   . Neuromuscular disorder ([place])    diabetic neuropathy  . Neuropathy   . Postmenopausal 07/19/2015    Current Outpatient Medications:  .  ACCU-CHEK AVIVA PLUS test strip, , Disp: , Rfl:  .  acetaminophen (TYLENOL) 500 MG tablet, Take 500 mg by mouth every 6 (six) hours as needed for moderate pain or headache. , Disp: , Rfl:  .  atorvastatin (LIPITOR) 20 MG tablet, Take 20 mg by mouth daily., Disp: , Rfl:  .  conjugated estrogens (PREMARIN) vaginal cream, Place [id number] Applicatorfuls vaginally 3 (three) times a week. For vaginal thinning and irritation in preop timeframe, Disp: 42.5 g, Rfl: 2 .  diltiazem (CARDIZEM LA) 240 MG 24 hr tablet, diltiazem ER 240 mg tablet,extended release 24 hr, Disp: , Rfl:  .  diltiazem (CARDIZEM) 120 MG tablet, Take 120 mg by mouth 2 (two) times daily., Disp: , Rfl:  .  ferrous sulfate 325 (65 FE) MG tablet, Take 325 mg by mouth daily with breakfast. , Disp: , Rfl:  .  gabapentin (NEURONTIN) 100 MG capsule, Take 100 mg by mouth 2 (two) times daily. , Disp: , Rfl:  .  hydrochlorothiazide (HYDRODIURIL) 25 MG  tablet, Take 25 mg by mouth daily. , Disp: , Rfl:  .  HYDROcodone-acetaminophen (NORCO/VICODIN) 5-325 MG tablet, hydrocodone 5 mg-acetaminophen 325 mg tablet, Disp: , Rfl:  .  losartan (COZAAR) 50 MG tablet, Take 50 mg by mouth daily., Disp: , Rfl:  .  metFORMIN (GLUCOPHAGE) 1000 MG tablet, Take 1,000 mg by mouth 2 (two) times daily with a meal., Disp: , Rfl:  .  mupirocin ointment (BACTROBAN) 2 %, Apply 1 application topically 2 (two) times daily., Disp: 22 g, Rfl: 0 .  polyethylene glycol powder (MIRALAX) powder, Take 17 g by mouth daily. To prevent constipation, Disp: 255 g, Rfl: prn .  vitamin B-12 (CYANOCOBALAMIN) 500 MCG tablet, Take 500 mcg by mouth 2 (two) times daily. , Disp: , Rfl:  .  cephALEXin (KEFLEX) 500 MG capsule, Take 1 capsule (500 mg total) by mouth 4 (four) times daily., Disp: 20 capsule, Rfl: 0  Social History   Tobacco Use  Smoking Status Never Smoker  Smokeless Tobacco Never Used    Allergies  Allergen Reactions  . Clindamycin/Lincomycin Other (See Comments)    Mouth taste like metal  . Enalapril Other (See Comments) and Cough    Constant Cough   Objective:  There were no vitals filed for this  visit. There is no height or weight on file to calculate BMI. Constitutional Well developed. Well nourished.  Vascular Dorsalis pedis pulses present 1+ bilaterally  Posterior tibial pulses 1+ bilaterally  Pedal hair growth diminished. Capillary refill normal to all digits.  No cyanosis or clubbing noted.  Neurologic Normal speech. Oriented to person, place, and time. Epicritic sensation to light touch grossly present bilaterally. Protective sensation with 5.07 monofilament  absent bilaterally.  Dermatologic Nails elongated, thickened, dystrophic. No open wounds. No skin lesions.  Orthopedic: Normal joint ROM without pain or crepitus bilaterally. Hx Charcot defomity right foot with rocker bottom. Hammertoe deformities bilat   Assessment:   1. Diabetic  peripheral neuropathy ([place])   2. 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[place])   3. Hammer toes of both feet   4. 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  Diabetes with DPN, Onychomycosis -Educated on diabetic footcare. Diabetic risk level 1 -Nails x10 debrided sharply and manually with large nail nipper and rotary burr.   Procedure: Nail Debridement Rationale: Patient meets criteria for routine foot care due to DPN Type of Debridement: manual, sharp debridement. Instrumentation: Nail nipper, rotary burr. Number of Nails: 10  Charcot foot right -Would benefit from DM shoes will make appt for fabrication.  No follow-ups on file.

## 2019-04-11 NOTE — Telephone Encounter (Signed)
Note done

## 2019-04-13 ENCOUNTER — Ambulatory Visit (INDEPENDENT_AMBULATORY_CARE_PROVIDER_SITE_OTHER): Payer: Medicare Other | Admitting: Orthotics

## 2019-04-13 ENCOUNTER — Other Ambulatory Visit: Payer: Self-pay

## 2019-04-13 DIAGNOSIS — M2042 Other hammer toe(s) (acquired), left foot: Secondary | ICD-10-CM | POA: Diagnosis not present

## 2019-04-13 DIAGNOSIS — M2041 Other hammer toe(s) (acquired), right foot: Secondary | ICD-10-CM | POA: Diagnosis not present

## 2019-04-13 DIAGNOSIS — E11610 Type 2 diabetes mellitus with diabetic neuropathic arthropathy: Secondary | ICD-10-CM

## 2019-04-13 DIAGNOSIS — E1142 Type 2 diabetes mellitus with diabetic polyneuropathy: Secondary | ICD-10-CM | POA: Diagnosis not present

## 2019-04-27 NOTE — Progress Notes (Signed)
Patient came in today to pick up CUSTOMdiabetic shoes and custom inserts.  Same was well pleased with fit and function.   The patient could ambulate without any discomfort; there were no signs of any quality issues. The foot ortheses offered full contact with plantar surface and contoured the arch well.   The shoes fit well with no heel slippage and areas of pressure concern.   Patient advised to contact us if any problems arise.  Patient also advised on how to report any issues. 

## 2019-05-07 ENCOUNTER — Other Ambulatory Visit (HOSPITAL_COMMUNITY): Payer: Self-pay | Admitting: Nephrology

## 2019-05-07 ENCOUNTER — Other Ambulatory Visit: Payer: Self-pay | Admitting: Nephrology

## 2019-05-07 DIAGNOSIS — N170 Acute kidney failure with tubular necrosis: Secondary | ICD-10-CM

## 2019-05-17 ENCOUNTER — Other Ambulatory Visit: Payer: Self-pay

## 2019-05-17 ENCOUNTER — Ambulatory Visit (HOSPITAL_COMMUNITY)
Admission: RE | Admit: 2019-05-17 | Discharge: 2019-05-17 | Disposition: A | Discharge status: Home / Self Care | Payer: Medicare Other | Source: Ambulatory Visit | Attending: Nephrology | Admitting: Nephrology

## 2019-05-17 DIAGNOSIS — N170 Acute kidney failure with tubular necrosis: Secondary | ICD-10-CM | POA: Diagnosis present

## 2019-06-21 ENCOUNTER — Encounter: Payer: Self-pay | Admitting: Orthopedic Surgery

## 2019-06-21 ENCOUNTER — Other Ambulatory Visit: Payer: Self-pay

## 2019-06-21 ENCOUNTER — Ambulatory Visit (INDEPENDENT_AMBULATORY_CARE_PROVIDER_SITE_OTHER): Payer: Medicare Other | Admitting: Orthopedic Surgery

## 2019-06-21 VITALS — BP 154/78 | HR 79 | Ht 66.0 in | Wt 138.0 lb

## 2019-06-21 DIAGNOSIS — G8929 Other chronic pain: Secondary | ICD-10-CM

## 2019-06-21 DIAGNOSIS — M25512 Pain in left shoulder: Secondary | ICD-10-CM | POA: Diagnosis not present

## 2019-06-21 NOTE — Patient Instructions (Signed)

## 2019-06-21 NOTE — Progress Notes (Signed)
Chief Complaint  Patient presents with  . 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 presents with increasing left shoulder pain  Her daughters had surgery for Charcot foot she has an external fixator she is caring for her daughter as well as her son-in-law who had a heart attack  They have been with her since July and she has had to do a lot of extra work  She can lift her arm to about 90 degrees but has pain and difficulty getting things in and out of a shelf or working above her head  Her exam shows painful left shoulder decreased range of motion mild weakness in the rotator cuff  After discussing options of injection, therapy or surgery she opted to have the injection placed in the left shoulder as a therapeutic option  Procedure note the subacromial injection shoulder left   Verbal consent was obtained to inject the  Left   Shoulder  Timeout was completed to confirm the injection site is a subacromial space of the  left  shoulder  Medication used Depo-Medrol 40 mg and lidocaine 1% 3 cc  Anesthesia was provided by ethyl chloride  The injection was performed in the left  posterior subacromial space. After pinning the skin with alcohol and anesthetized the skin with ethyl chloride the subacromial space was injected using a 20-gauge needle. There were no complications  Sterile dressing was applied.   Encounter Diagnosis  Name Primary?  . Chronic left shoulder pain Yes    Return as needed

## 2019-06-22 ENCOUNTER — Encounter (HOSPITAL_COMMUNITY)
Admission: RE | Admit: 2019-06-22 | Discharge: 2019-06-22 | Disposition: A | Discharge status: Home / Self Care | Payer: Medicare Other | Source: Ambulatory Visit | Attending: Nephrology | Admitting: Nephrology

## 2019-06-22 ENCOUNTER — Encounter (HOSPITAL_COMMUNITY): Payer: Self-pay

## 2019-06-22 DIAGNOSIS — D509 Iron deficiency anemia, unspecified: Secondary | ICD-10-CM | POA: Diagnosis not present

## 2019-06-22 DIAGNOSIS — N1832 Chronic kidney disease, stage 3b: Secondary | ICD-10-CM | POA: Insufficient documentation

## 2019-06-22 MED ORDER — SODIUM CHLORIDE 0.9 % IV SOLN: Freq: Once | INTRAVENOUS | Status: AC

## 2019-06-22 MED ORDER — SODIUM CHLORIDE 0.9 % IV SOLN
510.0000 mg | Freq: Once | INTRAVENOUS | Status: AC
  Administered 2019-06-22: 510 mg via INTRAVENOUS
  Filled 2019-06-22: qty 17

## 2019-06-28 ENCOUNTER — Other Ambulatory Visit: Payer: Self-pay

## 2019-06-28 ENCOUNTER — Encounter (HOSPITAL_COMMUNITY)
Admission: RE | Admit: 2019-06-28 | Discharge: 2019-06-28 | Disposition: A | Discharge status: Home / Self Care | Payer: Medicare Other | Source: Ambulatory Visit | Attending: Nephrology | Admitting: Nephrology

## 2019-06-28 ENCOUNTER — Encounter (HOSPITAL_COMMUNITY): Payer: Self-pay

## 2019-06-28 DIAGNOSIS — N1832 Chronic kidney disease, stage 3b: Secondary | ICD-10-CM | POA: Diagnosis not present

## 2019-06-28 MED ORDER — SODIUM CHLORIDE 0.9 % IV SOLN: Freq: Once | INTRAVENOUS | Status: AC

## 2019-06-28 MED ORDER — SODIUM CHLORIDE 0.9 % IV SOLN
510.0000 mg | Freq: Once | INTRAVENOUS | Status: AC
  Administered 2019-06-28: 510 mg via INTRAVENOUS
  Filled 2019-06-28: qty 510

## 2019-08-06 ENCOUNTER — Encounter: Payer: Self-pay | Admitting: Podiatry

## 2019-08-06 ENCOUNTER — Other Ambulatory Visit: Payer: Self-pay

## 2019-08-06 ENCOUNTER — Ambulatory Visit (INDEPENDENT_AMBULATORY_CARE_PROVIDER_SITE_OTHER): Payer: Medicare Other

## 2019-08-06 ENCOUNTER — Ambulatory Visit (INDEPENDENT_AMBULATORY_CARE_PROVIDER_SITE_OTHER): Payer: Medicare Other | Admitting: Podiatry

## 2019-08-06 VITALS — Temp 96.9°F | Resp 14

## 2019-08-06 DIAGNOSIS — L97529 Non-pressure chronic ulcer of other part of left foot with unspecified severity: Secondary | ICD-10-CM | POA: Diagnosis not present

## 2019-08-06 DIAGNOSIS — E1140 Type 2 diabetes mellitus with diabetic neuropathy, unspecified: Secondary | ICD-10-CM

## 2019-08-06 DIAGNOSIS — L84 Corns and callosities: Secondary | ICD-10-CM

## 2019-08-06 DIAGNOSIS — M2041 Other hammer toe(s) (acquired), right foot: Secondary | ICD-10-CM

## 2019-08-06 DIAGNOSIS — E1149 Type 2 diabetes mellitus with other diabetic neurological complication: Secondary | ICD-10-CM | POA: Diagnosis not present

## 2019-08-06 NOTE — Progress Notes (Signed)
Subjective:   Patient ID: Stacy Webb, female   DOB: 74 y.o.   MRN: [id number]   HPI Patient presents concerned about just getting her left big toe looked at it she has had chronic keratotic lesion formation that gets treated by another physician she wanted it checked and also the second toe on her left foot is starting to bother her more along with underneath her right foot   ROS      Objective:  Physical Exam  Neuro vascular status unchanged with patient having long-term diabetes with a keratotic lesion hallux left localized and plantar right that at this point show no drainage erythema edema and also a rigidly contracted second digit left foot     Assessment:  Lesions that are more pressure and may be very mild ulceration but more keratotic currently with hammertoe deformity second left     Plan:  H&P reviewed both conditions and for the hammertoe he will use padding cushioning technique and I debrided other lesions no drainage and patient will be seen back as needed

## 2019-08-10 ENCOUNTER — Encounter (HOSPITAL_COMMUNITY): Payer: Self-pay

## 2019-08-10 ENCOUNTER — Ambulatory Visit (HOSPITAL_COMMUNITY): Payer: Medicare Other | Attending: Nurse Practitioner

## 2019-08-10 ENCOUNTER — Other Ambulatory Visit: Payer: Self-pay

## 2019-08-10 DIAGNOSIS — R29898 Other symptoms and signs involving the musculoskeletal system: Secondary | ICD-10-CM | POA: Insufficient documentation

## 2019-08-10 DIAGNOSIS — M25611 Stiffness of right shoulder, not elsewhere classified: Secondary | ICD-10-CM | POA: Diagnosis present

## 2019-08-10 DIAGNOSIS — M25612 Stiffness of left shoulder, not elsewhere classified: Secondary | ICD-10-CM | POA: Insufficient documentation

## 2019-08-10 NOTE — Patient Instructions (Signed)

## 2019-08-11 ENCOUNTER — Telehealth (HOSPITAL_COMMUNITY): Payer: Self-pay

## 2019-08-11 NOTE — Telephone Encounter (Signed)
pt has to cancel her remaining appts because her son-in-law and daughter live with her and her daughter has to have surgery and she will be taking care of them, no please discharge her and she will get another referral if needed in the future.

## 2019-08-11 NOTE — Therapy (Signed)
[facility] [place], [place], [address] Phone: [phone]   Fax:  [phone]  Occupational Therapy Evaluation  Patient Details  Name: Stacy Webb MRN: [id number] Date of Birth: [birth year] Referring Provider (OT): Carlis Abbott, NP (will follow up with Dr. Aline Brochure after therapy)   Encounter Date: 08/10/2019  OT End of Session - 08/11/19 1218    Visit Number  1    Number of Visits  1    Authorization Type  [employer] medicare    Authorization Time Period  $30 copay No visit limit. No authorization needed.    OT Start Time  1605    OT Stop Time  1639    OT Time Calculation (min)  34 min    Activity Tolerance  Patient tolerated treatment well    Behavior During Therapy  WFL for tasks assessed/performed       Past Medical History:  Diagnosis Date  . Anemia   . Anxiety   . Arthritis    fingers  . 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[place])   . Diabetes mellitus without complication ([place])   . Excessive hair growth 07/19/2015  . Heart murmur   . History of kidney stones   . Hyperlipidemia   . Hypertension   . Neuromuscular disorder ([place])    diabetic neuropathy  . Neuropathy   . Postmenopausal 07/19/2015    Past Surgical History:  Procedure Laterality Date  . ANTERIOR AND POSTERIOR REPAIR N/A 04/28/2018   Procedure: ANTERIOR (CYSTOCELE) AND POSTERIOR REPAIR (RECTOCELE);  Surgeon: Jonnie Kind, MD;  Location: [facility]S;  Service: Gynecology;  Laterality: N/A;  . APPENDECTOMY    . CARPAL TUNNEL RELEASE Bilateral   . CESAREAN SECTION    . COLONOSCOPY N/A 08/28/2012   Procedure: COLONOSCOPY;  Surgeon: Rogene Houston, MD;  Location: [facility] SU[facility];  Service: Endoscopy;  Laterality: N/A;  915  . COLONOSCOPY N/A 12/25/2017   Procedure: COLONOSCOPY;  Surgeon: Rogene Houston, MD;  Location: [facility];  Service: Endoscopy;  Laterality: N/A;  12:45    There were no vitals filed for this visit.  Subjective Assessment -  08/10/19 1610    Subjective   S: I just can't raise them up high.    Pertinent History  Patient is a 74 y/o female S/P bilateral shoulder weakness and decreased mobility which occured 6 months ago with no known cause. Carlis Abbott, NP has referred patient to occupational therapy for evaluation and treatment.    Patient Stated Goals  To increase ROM and strength.    Currently in Pain?  No/denies   prior to injection, patient reports 6/10 pain level in LUE.       [facility] OT Assessment - 08/10/19 1611      Assessment   Medical Diagnosis  bilateral shoulder weakness and decreased ROM.    Referring Provider (OT)  Carlis Abbott, NP   will follow up with Dr. Aline Brochure after therapy   Onset Date/Surgical Date  --   6 months ago   Hand Dominance  Right    Next MD Visit  TBD    Prior Therapy  None      Precautions   Precautions  None      Restrictions   Weight Bearing Restrictions  No      Balance Screen   Has the patient fallen in the past 6 months  No      Home  Environment   Family/patient expects  to be discharged to:  Private residence    Living Arrangements  Children    Additional Comments  Patient's daughter and son in law have been living with patient since July 2020. Son in law had a heart attack and Daughter had surgery. Patient has been caring for them both.   This situation has caused a lot of stress.      Prior Function   Level of Independence  Independent    Vocation  Retired      ADL   ADL comments  Difficulty with any above the shoulder actions such as placing dishes in the cabinet and lifting heavy items.      Mobility   Mobility Status  Independent      Written Expression   Dominant Hand  Right      Vision - History   Baseline Vision  Wears glasses all the time      Cognition   Overall Cognitive Status  Within Functional Limits for tasks assessed      Observation/Other Assessments   Focus on Therapeutic Outcomes (FOTO)   N/A      ROM / Strength    AROM / PROM / Strength  AROM;PROM;Strength      Palpation   Palpation comment  no fascial restrictions noted in BUE shoulders, upper trapezius, and scapularis region.       AROM   Overall AROM Comments  Assessed IR/er adducted    AROM Assessment Site  Shoulder    Right/Left Shoulder  Left;Right    Right Shoulder Flexion  130 Degrees    Right Shoulder ABduction  145 Degrees    Right Shoulder Internal Rotation  90 Degrees    Right Shoulder External Rotation  50 Degrees    Left Shoulder Flexion  140 Degrees    Left Shoulder ABduction  130 Degrees    Left Shoulder Internal Rotation  90 Degrees    Left Shoulder External Rotation  38 Degrees      PROM   Overall PROM Comments  Assessed seated. IR/er adducted.     PROM Assessment Site  Shoulder    Right/Left Shoulder  Left;Right    Right Shoulder Flexion  170 Degrees    Right Shoulder ABduction  180 Degrees    Right Shoulder Internal Rotation  90 Degrees    Right Shoulder External Rotation  90 Degrees    Left Shoulder Flexion  165 Degrees    Left Shoulder ABduction  180 Degrees    Left Shoulder Internal Rotation  90 Degrees    Left Shoulder External Rotation  80 Degrees      Strength   Overall Strength Comments  Assessed seated. IR/er adducted    Strength Assessment Site  Shoulder    Right/Left Shoulder  Right;Left    Right Shoulder Flexion  3+/5    Right Shoulder ABduction  3+/5    Right Shoulder Internal Rotation  5/5    Right Shoulder External Rotation  3/5    Left Shoulder Flexion  3+/5    Left Shoulder ABduction  3+/5    Left Shoulder Internal Rotation  5/5    Left Shoulder External Rotation  3+/5                      OT Education - 08/10/19 1630    Education Details  A/ROM BUE shoulder exercises - supine for now.    Person(s) Educated  Patient    Methods  Explanation;Demonstration;Verbal cues;Handout  Comprehension  Verbalized understanding;Returned demonstration       OT Short Term Goals -  08/11/19 1223      OT SHORT TERM GOAL #1   Title  Patient will be educated and demonstrate understanding of HEP in order to increase functional use of her shoulders during basic daily tasks while increasing ROM and strength.    Time  1    Period  Days    Status  Achieved    Target Date  08/10/19               Plan - 08/11/19 1219    Clinical Impression Statement  A: Patient is a 74 y/o female S/P bilateral shoulder weakness causing decreased ROM and strength resulting in difficulty completing any type of high level reaching tasks at home. *NOTE: The following day 08/11/19, patient called clinic to cancel all her appointments as her daughter is having surgery again and she is unable to attend complete therapy at this time. Pt will continue with HEP established at evaluation and return to clinic if needed in the future.    OT Occupational Profile and History  Problem Focused Assessment - Including review of records relating to presenting problem    Occupational performance deficits (Please refer to evaluation for details):  ADL's;IADL's;Rest and Sleep;Leisure    Marketing executive / Function / Physical Skills  ADL;UE functional use;ROM;Strength    Rehab Potential  Good    Clinical Decision Making  Limited treatment options, no task modification necessary    Comorbidities Affecting Occupational Performance:  None    Modification or Assistance to Complete Evaluation   No modification of tasks or assist necessary to complete eval    OT Frequency  One time visit    OT Treatment/Interventions  Patient/family education    Plan  P: One time visit- eval only. Pt unable to attend recommended therapy sessions. Pt will complete HEP at home independently. Pt to follow up with OT in the future if needed.    Consulted and Agree with Plan of Care  Patient       Patient will benefit from skilled therapeutic intervention in order to improve the following deficits and impairments:   Body Structure /  Function / Physical Skills: ADL, UE functional use, ROM, Strength       Visit Diagnosis: Other symptoms and signs involving the musculoskeletal system - Plan: Ot plan of care cert/re-cert  Stiffness of right shoulder, not elsewhere classified - Plan: Ot plan of care cert/re-cert  Stiffness of left shoulder, not elsewhere classified - Plan: Ot plan of care cert/re-cert    Problem List Patient Active Problem List   Diagnosis Date Noted  . Absolute anemia 10/30/2017  . Constipation 06/10/2017  . Hemorrhoids 06/10/2017  . Cystocele with rectocele 06/10/2017  . Rectocele 06/10/2017  . Diabetic foot ulcer ([place]) 09/27/2016  . CKD (chronic kidney disease), stage III 09/27/2016  . 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[place]) 09/27/2016  . Hyperlipidemia 09/27/2016  . Hypertension 09/27/2016  . Diabetic ulcer of foot associated with diabetes mellitus due to underlying condition, limited to breakdown of skin ([place]) 09/27/2016  . Postmenopausal 07/19/2015  . Essential hypertension, benign 08/11/2012  . High cholesterol 08/11/2012  . Family hx of colon cancer 08/11/2012  . Diabetes [facility]) 08/11/2012   Ailene Ravel, OTR/L,CBIS  [phone]  08/11/2019, 12:27 PM  [facility] [address] [place], [place], [address] Phone: [phone]   Fax:  [phone]  Name:  KORRA PANKOW MRN: [id number] Date of Birth: Dec 14, 1945

## 2019-08-18 ENCOUNTER — Encounter (HOSPITAL_COMMUNITY): Payer: Medicare Other

## 2019-08-20 ENCOUNTER — Encounter (HOSPITAL_COMMUNITY): Payer: Medicare Other | Admitting: Occupational Therapy

## 2019-08-24 ENCOUNTER — Ambulatory Visit: Payer: Medicare Other | Admitting: Family Medicine

## 2019-08-31 ENCOUNTER — Encounter (HOSPITAL_COMMUNITY): Payer: Medicare Other | Admitting: Occupational Therapy

## 2019-09-03 ENCOUNTER — Encounter (HOSPITAL_COMMUNITY): Payer: Medicare Other | Admitting: Occupational Therapy

## 2019-09-07 ENCOUNTER — Encounter (HOSPITAL_COMMUNITY): Payer: Medicare Other | Admitting: Occupational Therapy

## 2019-09-10 ENCOUNTER — Encounter (HOSPITAL_COMMUNITY): Payer: Medicare Other | Admitting: Occupational Therapy

## 2019-09-14 ENCOUNTER — Encounter (HOSPITAL_COMMUNITY): Payer: Medicare Other | Admitting: Occupational Therapy

## 2019-09-16 ENCOUNTER — Encounter (HOSPITAL_COMMUNITY): Payer: Medicare Other | Admitting: Occupational Therapy

## 2019-11-02 ENCOUNTER — Ambulatory Visit (INDEPENDENT_AMBULATORY_CARE_PROVIDER_SITE_OTHER): Payer: Medicare Other | Admitting: Orthopedic Surgery

## 2019-11-02 ENCOUNTER — Encounter: Payer: Self-pay | Admitting: Orthopedic Surgery

## 2019-11-02 ENCOUNTER — Other Ambulatory Visit: Payer: Self-pay

## 2019-11-02 VITALS — BP 186/91 | HR 84 | Ht 66.0 in | Wt 139.0 lb

## 2019-11-02 DIAGNOSIS — G8929 Other chronic pain: Secondary | ICD-10-CM

## 2019-11-02 DIAGNOSIS — M25512 Pain in left shoulder: Secondary | ICD-10-CM | POA: Diagnosis not present

## 2019-11-02 NOTE — Progress Notes (Signed)
Chief Complaint  Patient presents with  . Shoulder Pain    L/not hurting as bad today. Sometimes it hurts all the way down to my elbow    74 year old female previously treated for arthritis and rotator cuff insufficiency with an injection  Patient opted for injection versus referral for surgery  She still having pain and weakness especially with overhead activity  Exhibits weakness in her left rotator cuff  Rather than proceed with surgery right away she would like to try an injection and then get a referral to shoulder specialist in a month or 2  . Encounter Diagnosis  Name Primary?  . Chronic left shoulder pain Yes    Procedure note the subacromial injection shoulder left   Verbal consent was obtained to inject the  Left   Shoulder  Timeout was completed to confirm the injection site is a subacromial space of the  left  shoulder  Medication used Depo-Medrol 40 mg and lidocaine 1% 3 cc  Anesthesia was provided by ethyl chloride  The injection was performed in the left  posterior subacromial space. After pinning the skin with alcohol and anesthetized the skin with ethyl chloride the subacromial space was injected using a 20-gauge needle. There were no complications  Sterile dressing was applied.  referral

## 2019-11-02 NOTE — Patient Instructions (Signed)
Driving Directions to [employer] from [employer] address is [place] The phone number is [phone]   1. Start out going [place] on [address] toward [address].  Then 0.02 miles0.02 total miles 2. Take the 1st right onto [address]. Continue to follow [address].  If you reach [facility] you've gone a little too far  Then 0.58 miles0.60 total miles 3. Turn right onto [address] is just past [facility]  Then 2.25 miles2.85 total miles 4. Take the [address] ramp toward [place].  Then 0.25 miles3.10 total miles 5. Merge onto [address]  Then 18.17 miles21.28 total miles 6. Merge onto [address].  Then 1.47 miles22.74 total miles 7. Turn right onto [address] [place] is just past [place]  Then 0.11 miles22.85 total miles  8. [address], [place], [place] [address], [place] on the left (sits in the curve)

## 2019-12-08 ENCOUNTER — Ambulatory Visit (INDEPENDENT_AMBULATORY_CARE_PROVIDER_SITE_OTHER): Payer: Medicare Other | Admitting: Orthopedic Surgery

## 2019-12-08 DIAGNOSIS — M19012 Primary osteoarthritis, left shoulder: Secondary | ICD-10-CM

## 2019-12-12 ENCOUNTER — Encounter: Payer: Self-pay | Admitting: Orthopedic Surgery

## 2019-12-12 NOTE — Progress Notes (Signed)
Office Visit Note   Patient: Stacy Webb           Date of Birth: Oct 31, 1945           MRN: [id number] Visit Date: 12/08/2019 Requested by: Carole Civil, [place],  [place] [address] PCP: [name], MD  Subjective: Chief Complaint  Patient presents with  . Shoulder Pain    HPI: Stacy Webb is a 74 y.o. female who presents to the office complaining of left shoulder pain.  Patient has a history of chronic left shoulder pain for several years.  She denies any recent injuries.  She has a history of right rotator cuff tear without surgery.  No history of left shoulder surgery.  She is right-hand dominant.  She notes pain that wakes her up at night.  Has to sleep on her back and cannot sleep on her left side.  She takes Tylenol for pain relief.  She has had left shoulder injection by Dr. Aline Brochure on 11/02/2019 that provided 50% relief but it has now worn off completely.  She has been doing home exercise program since July.  Denies any neck pain, radicular pain, numbness/tingling..                ROS: All systems reviewed are negative as they relate to the chief complaint within the history of present illness.  Patient denies fevers or chills.  Assessment & Plan: Visit Diagnoses:  1. Primary osteoarthritis, left shoulder     Plan: Patient is a 74 year old female presents complaint of left shoulder pain.  She has a long history of several years of left shoulder pain.  She has reduced range of motion and radiographic evidence of left shoulder osteoarthritis.  She had good relief with left shoulder injection by Dr. Aline Brochure in July but is now worn off.  Discussed options available to patient including continuous injections versus shoulder placement.  After discussion of the recovery from shoulder placement as well as the risks and benefits of the procedure, patient does not wish to proceed with surgery at this time.  She does not want an MRI for further  evaluation of the left shoulder either.  She wants to follow-up with Dr. Aline Brochure in the future and once her shoulder pain becomes unbearable, she will follow-up with this office for shoulder replacement.  Patient agreed with this plan.  Follow-Up Instructions: No follow-ups on file.   Orders:  No orders of the defined types were placed in this encounter.  No orders of the defined types were placed in this encounter.     Procedures: No procedures performed   Clinical Data: No additional findings.  Objective: Vital Signs: There were no vitals taken for this visit.  Physical Exam:  Constitutional: Patient appears well-developed HEENT:  Head: Normocephalic Eyes:EOM are normal Neck: Normal range of motion Cardiovascular: Normal rate Pulmonary/chest: Effort normal Neurologic: Patient is alert Skin: Skin is warm Psychiatric: Patient has normal mood and affect  Ortho Exam: Ortho exam demonstrates left shoulder with 50 degrees external rotation, 95 degrees abduction, 125 degrees of forward flexion.  Weakness with supraspinatus resistance testing.  Excellent strength of the infraspinatus and subscapularis.  5/5 motor strength of the bilateral grip, finger abduction, pronation/supination, bicep, tricep.  Sensation intact through all dermatomes of the bilateral upper extremities.  No tenderness throughout the axial cervical spine.  No pain with cervical range of motion.  Crepitus felt with passive range of motion of the left shoulder  that is consistent with osteoarthritis.  Specialty Comments:  No specialty comments available.  Imaging: No results found.   PMFS History: Patient Active Problem List   Diagnosis Date Noted  . Absolute anemia 10/30/2017  . Constipation 06/10/2017  . Hemorrhoids 06/10/2017  . Cystocele with rectocele 06/10/2017  . Rectocele 06/10/2017  . Diabetic foot ulcer ([facility]) 09/27/2016  . CKD (chronic kidney disease), stage III 09/27/2016  . Charcot foot due  to diabetes mellitus ([place]) 09/27/2016  . Hyperlipidemia 09/27/2016  . Hypertension 09/27/2016  . Diabetic ulcer of foot associated with diabetes mellitus due to underlying condition, limited to breakdown of skin ([facility]) 09/27/2016  . Postmenopausal 07/19/2015  . Essential hypertension, benign 08/11/2012  . High cholesterol 08/11/2012  . Family hx of colon cancer 08/11/2012  . Diabetes ([facility]) 08/11/2012   Past Medical History:  Diagnosis Date  . Anemia   . Anxiety   . Arthritis    fingers  . 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[facility])   . Diabetes mellitus without complication ([facility])   . Excessive hair growth 07/19/2015  . Heart murmur   . History of kidney stones   . Hyperlipidemia   . Hypertension   . Neuromuscular disorder ([facility])    diabetic neuropathy  . Neuropathy   . Postmenopausal 07/19/2015    Family History  Problem Relation Age of Onset  . Pneumonia Son   . Suicidality Son   . Diabetes Daughter   . Colon cancer Paternal Aunt     Past Surgical History:  Procedure Laterality Date  . ANTERIOR AND POSTERIOR REPAIR N/A 04/28/2018   Procedure: ANTERIOR (CYSTOCELE) AND POSTERIOR REPAIR (RECTOCELE);  Surgeon: Jonnie Kind, MD;  Location: [facility];  Service: Gynecology;  Laterality: N/A;  . APPENDECTOMY    . CARPAL TUNNEL RELEASE Bilateral   . CESAREAN SECTION    . COLONOSCOPY N/A 08/28/2012   Procedure: COLONOSCOPY;  Surgeon: Rogene Houston, MD;  Location: [facility];  Service: Endoscopy;  Laterality: N/A;  915  . COLONOSCOPY N/A 12/25/2017   Procedure: COLONOSCOPY;  Surgeon: Rogene Houston, MD;  Location: [facility];  Service: Endoscopy;  Laterality: N/A;  12:45   Social History   Occupational History  . Not on file  Tobacco Use  . Smoking status: Never Smoker  . Smokeless tobacco: Never Used  Vaping Use  . Vaping Use: Never used  Substance and Sexual Activity  . Alcohol use: No  . Drug use: No  . Sexual activity: Not Currently    Birth  control/protection: Post-menopausal

## 2019-12-13 ENCOUNTER — Encounter: Payer: Self-pay | Admitting: Orthopedic Surgery

## 2020-02-15 ENCOUNTER — Other Ambulatory Visit: Payer: Medicare Other | Admitting: Orthotics

## 2020-02-17 ENCOUNTER — Other Ambulatory Visit: Payer: Self-pay

## 2020-02-17 ENCOUNTER — Ambulatory Visit (INDEPENDENT_AMBULATORY_CARE_PROVIDER_SITE_OTHER): Payer: Medicare Other | Admitting: Podiatry

## 2020-02-17 ENCOUNTER — Ambulatory Visit: Payer: Medicare Other | Admitting: Orthotics

## 2020-02-17 DIAGNOSIS — E11610 Type 2 diabetes mellitus with diabetic neuropathic arthropathy: Secondary | ICD-10-CM

## 2020-02-17 DIAGNOSIS — E1140 Type 2 diabetes mellitus with diabetic neuropathy, unspecified: Secondary | ICD-10-CM

## 2020-02-17 DIAGNOSIS — L97529 Non-pressure chronic ulcer of other part of left foot with unspecified severity: Secondary | ICD-10-CM

## 2020-02-17 DIAGNOSIS — M2011 Hallux valgus (acquired), right foot: Secondary | ICD-10-CM

## 2020-02-17 DIAGNOSIS — M21611 Bunion of right foot: Secondary | ICD-10-CM

## 2020-02-17 DIAGNOSIS — M2012 Hallux valgus (acquired), left foot: Secondary | ICD-10-CM

## 2020-02-17 DIAGNOSIS — L97511 Non-pressure chronic ulcer of other part of right foot limited to breakdown of skin: Secondary | ICD-10-CM

## 2020-02-17 DIAGNOSIS — M2041 Other hammer toe(s) (acquired), right foot: Secondary | ICD-10-CM | POA: Diagnosis not present

## 2020-02-17 DIAGNOSIS — M2042 Other hammer toe(s) (acquired), left foot: Secondary | ICD-10-CM

## 2020-02-17 DIAGNOSIS — M21612 Bunion of left foot: Secondary | ICD-10-CM

## 2020-02-17 DIAGNOSIS — E1142 Type 2 diabetes mellitus with diabetic polyneuropathy: Secondary | ICD-10-CM

## 2020-02-17 DIAGNOSIS — E1149 Type 2 diabetes mellitus with other diabetic neurological complication: Secondary | ICD-10-CM

## 2020-02-17 NOTE — Progress Notes (Signed)
Patient was cast today for custom diabetic shoes and inserts due to charcot deformity RIGHT;   She also saw Dr Sherryle Lis for diabetic foot exam..  Will not process until December as she has already receive DBS earlie this year.

## 2020-02-20 ENCOUNTER — Encounter: Payer: Self-pay | Admitting: Podiatry

## 2020-02-20 NOTE — Progress Notes (Signed)
  Subjective:  Patient ID: Stacy Webb, female    DOB: Apr 12, 1945,  MRN: [id number]  Chief Complaint  Patient presents with  . diabetic foot exam    foot exam     74 y.o. female presents with the above complaint. History confirmed with patient. Doing well, no new issues. Here today to be fitted for diabetic shoes as well  Objective:  Physical Exam: warm, good capillary refill, no trophic changes or ulcerative lesions and normal DP and PT pulses. Loss of protective sensation noted Left Foot: callus submet 1, plantar hallux2  Right Foot: charcot foot stable with midfoot collapse and rocker bottom  Assessment:   1. Ulcer of toe of left foot, unspecified ulcer stage ([facility])   2. Ulcer of right foot limited to breakdown of skin ([facility])   3. Hammertoe of right foot   4. Hammertoe of left foot   5. Hallux valgus with bunions, left   6. Hallux valgus with bunions, right   7. Diabetic neuropathy with neurologic complication ([facility])   8. Diabetic peripheral neuropathy ([facility])   9. 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[place] [facility])      Plan:  Patient was evaluated and treated and all questions answered.  Patient educated on diabetes. Discussed proper diabetic foot care and discussed risks and complications of disease. Educated patient in depth on reasons to return to the office immediately should he/she discover anything concerning or new on the feet. All questions answered. Discussed proper shoes as well.    No follow-ups on file.

## 2020-02-24 ENCOUNTER — Ambulatory Visit: Payer: Medicare Other | Attending: Internal Medicine

## 2020-02-24 DIAGNOSIS — Z23 Encounter for immunization: Secondary | ICD-10-CM

## 2020-02-24 NOTE — Progress Notes (Signed)
   Covid-19 Vaccination Clinic  Name:  DAYNE DEKAY    MRN: [id number] DOB: [birth year]  02/24/2020  Ms. Poulton was observed post Covid-19 immunization for 15 minutes without incident. She was provided with Vaccine Information Sheet and instruction to access the V-Safe system.   Ms. Biswell was instructed to call 911 with any severe reactions post vaccine: Marland Kitchen Difficulty breathing  . Swelling of face and throat  . A fast heartbeat  . A bad rash all over body  . Dizziness and weakness   Immunizations Administered    No immunizations on file.

## 2020-02-29 ENCOUNTER — Other Ambulatory Visit (HOSPITAL_COMMUNITY): Payer: Self-pay | Admitting: Family Medicine

## 2020-02-29 DIAGNOSIS — Z1231 Encounter for screening mammogram for malignant neoplasm of breast: Secondary | ICD-10-CM

## 2020-03-05 ENCOUNTER — Ambulatory Visit
Admission: EM | Admit: 2020-03-05 | Discharge: 2020-03-05 | Disposition: A | Discharge status: Home / Self Care | Payer: Medicare Other | Attending: Emergency Medicine | Admitting: Emergency Medicine

## 2020-03-05 ENCOUNTER — Other Ambulatory Visit: Payer: Self-pay

## 2020-03-05 DIAGNOSIS — L03032 Cellulitis of left toe: Secondary | ICD-10-CM

## 2020-03-05 MED ORDER — CEPHALEXIN 500 MG PO CAPS: 500.0000 mg | ORAL_CAPSULE | Freq: Three times a day (TID) | ORAL | 0 refills | Status: AC

## 2020-03-05 NOTE — ED Triage Notes (Signed)
Pt presents with bilateral ulcerations to feet. Pt states are chronic and she sees podiatry but has become worse in past few days

## 2020-03-05 NOTE — ED Provider Notes (Signed)
HPI  SUBJECTIVE:  Stacy Webb is a 74 y.o. female who presents with left first toe erythema, swelling, intermittent burning pain that lasts minutes starting last night.  She states that podiatry trimmed her toenails 5 days ago.  No fevers, body aches.  She reports chronic serosanguineous drainage from the lesion on her foot, it has not changed recently.  States the erythema is not spreading proximally.  No limitation of motion.  She tried Bactroban without improvement in her symptoms.  There are no aggravating or alleviating factors.  She has a past medical history of diabetes, peripheral neuropathy, Charcot foot, chronic kidney disease stage III, hypertension.  No history of osteoporosis, peripheral arterial disease, peripheral vascular disease, smoking. No history of MRSA. KGY:JEHUDJSH, Richardson Landry, MD    Past Medical History:  Diagnosis Date  . Anemia   . Anxiety   . Arthritis    fingers  . 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[facility])   . Diabetes mellitus without complication ([facility])   . Excessive hair growth 07/19/2015  . Heart murmur   . History of kidney stones   . Hyperlipidemia   . Hypertension   . Neuromuscular disorder ([facility])    diabetic neuropathy  . Neuropathy   . Postmenopausal 07/19/2015    Past Surgical History:  Procedure Laterality Date  . ANTERIOR AND POSTERIOR REPAIR N/A 04/28/2018   Procedure: ANTERIOR (CYSTOCELE) AND POSTERIOR REPAIR (RECTOCELE);  Surgeon: Jonnie Kind, MD;  Location: [facility];  Service: Gynecology;  Laterality: N/A;  . APPENDECTOMY    . CARPAL TUNNEL RELEASE Bilateral   . CESAREAN SECTION    . COLONOSCOPY N/A 08/28/2012   Procedure: COLONOSCOPY;  Surgeon: Rogene Houston, MD;  [facility];  Service: Endoscopy;  Laterality: N/A;  915  . COLONOSCOPY N/A 12/25/2017   Procedure: COLONOSCOPY;  Surgeon: Rogene Houston, MD;  [facility];  Service: Endoscopy;  Laterality: N/A;  12:45    Family History  Problem Relation Age of  Onset  . Pneumonia Son   . Suicidality Son   . Diabetes Daughter   . Colon cancer Paternal Aunt     Social History   Tobacco Use  . Smoking status: Never Smoker  . Smokeless tobacco: Never Used  Vaping Use  . Vaping Use: Never used  Substance Use Topics  . Alcohol use: No  . Drug use: No    No current facility-administered medications for this encounter.  Current Outpatient Medications:  .  ACCU-CHEK AVIVA PLUS test strip, , Disp: , Rfl:  .  acetaminophen (TYLENOL) 500 MG tablet, Take 500 mg by mouth every 6 (six) hours as needed for moderate pain or headache. , Disp: , Rfl:  .  atorvastatin (LIPITOR) 20 MG tablet, Take 20 mg by mouth daily., Disp: , Rfl:  .  conjugated estrogens (PREMARIN) vaginal cream, Place 7.02 Applicatorfuls vaginally 3 (three) times a week. For vaginal thinning and irritation in preop timeframe (Patient not taking: Reported on 06/21/2019), Disp: 42.5 g, Rfl: 2 .  diltiazem (CARDIZEM CD) 360 MG 24 hr capsule, Take 360 mg by mouth daily. , Disp: , Rfl:  .  diltiazem (CARDIZEM) 120 MG tablet, Take 120 mg by mouth 2 (two) times daily. (Patient not taking: Reported on 11/02/2019), Disp: , Rfl:  .  ferrous sulfate 325 (65 FE) MG tablet, Take 325 mg by mouth daily with breakfast.  (Patient not taking: Reported on 11/02/2019), Disp: , Rfl:  .  gabapentin (NEURONTIN) 100 MG capsule,  Take 100 mg by mouth 2 (two) times daily. , Disp: , Rfl:  .  hydrochlorothiazide (HYDRODIURIL) 25 MG tablet, Take 25 mg by mouth daily. , Disp: , Rfl:  .  HYDROcodone-acetaminophen (NORCO/VICODIN) 5-325 MG tablet, hydrocodone 5 mg-acetaminophen 325 mg tablet (Patient not taking: Reported on 11/02/2019), Disp: , Rfl:  .  losartan (COZAAR) 50 MG tablet, Take 50 mg by mouth daily., Disp: , Rfl:  .  metFORMIN (GLUCOPHAGE) 1000 MG tablet, Take 1,000 mg by mouth 2 (two) times daily with a meal., Disp: , Rfl:  .  mupirocin ointment (BACTROBAN) 2 %, Apply 1 application topically 2 (two) times daily.  (Patient not taking: Reported on 11/02/2019), Disp: 22 g, Rfl: 0 .  polyethylene glycol powder (MIRALAX) powder, Take 17 g by mouth daily. To prevent constipation, Disp: 255 g, Rfl: prn .  vitamin B-12 (CYANOCOBALAMIN) 500 MCG tablet, Take 500 mcg by mouth 2 (two) times daily. , Disp: , Rfl:   Allergies  Allergen Reactions  . Clindamycin/Lincomycin Other (See Comments)    Mouth taste like metal  . Enalapril Other (See Comments) and Cough    Constant Cough     ROS  As noted in HPI.   Physical Exam  BP (!) 177/73 (BP Location: Right Arm)   Pulse 91   Temp 98.3 F (36.8 C) (Oral)   Resp 16   SpO2 96%   Constitutional: Well developed, well nourished, no acute distress Eyes:  EOMI, conjunctiva normal bilaterally HENT: Normocephalic, atraumatic,mucus membranes moist Respiratory: Normal inspiratory effort Cardiovascular: Normal rate GI: nondistended skin: Healing nontender scabbed wound lateral first left toe, no expressible purulent drainage.  No other break in the skin of the toe. Musculoskeletal: Erythema from tip of toe to MCP.  Positive swelling.  Sensation to light touch grossly intact.  No tenderness.  She is able to move her toe.  No other tenderness over the rest of the foot.  DP 2+.  No subcutaneous crepitus.      Neurologic: Alert & oriented x 3, no focal neuro deficits Psychiatric: Speech and behavior appropriate   ED Course   Medications - No data to display  No orders of the defined types were placed in this encounter.   No results found for this or any previous visit (from the past 24 hour(s)). No results found.  ED Clinical Impression  1. Cellulitis of great toe of left foot      ED Assessment/Plan  Patient with cellulitis of the toe.  Doubt osteomyelitis since symptoms started last night thus imaging was deferred.  She has no history of MRSA.  We will have her continue Bactroban, keep it clean and dry, follow-up with the wound care center or with  her podiatrist in 3 to 4 days and on a regular basis until it heals.  ER return precautions given . Calculated creatinine clearance based on labs from January 2020 42 mL/min.  Will renally dose Keflex at 500 mg every 8 hours for 7 days.  Discussed labs, imaging, MDM, treatment plan, and plan for follow-up with patient. Discussed sn/sx that should prompt return to the ED. patient agrees with plan.   No orders of the defined types were placed in this encounter.   *This clinic note was created using Dragon dictation software. Therefore, there may be occasional mistakes despite careful proofreading.   ?    Melynda Ripple, MD 03/07/20 1423

## 2020-03-05 NOTE — Discharge Instructions (Addendum)
Continue Bactroban.  Finish the Keflex, even if you feel better.  Keep this clean and dry.  Monitor very closely for worsening signs of infection

## 2020-03-17 ENCOUNTER — Other Ambulatory Visit: Payer: Self-pay

## 2020-03-17 ENCOUNTER — Other Ambulatory Visit (HOSPITAL_COMMUNITY): Payer: Self-pay | Admitting: Nurse Practitioner

## 2020-03-17 ENCOUNTER — Ambulatory Visit (HOSPITAL_COMMUNITY)
Admission: RE | Admit: 2020-03-17 | Discharge: 2020-03-17 | Disposition: A | Discharge status: Home / Self Care | Payer: Medicare Other | Source: Ambulatory Visit | Attending: Nurse Practitioner | Admitting: Nurse Practitioner

## 2020-03-17 DIAGNOSIS — M25562 Pain in left knee: Secondary | ICD-10-CM

## 2020-04-03 ENCOUNTER — Emergency Department (HOSPITAL_COMMUNITY): Payer: Medicare Other

## 2020-04-03 ENCOUNTER — Encounter (HOSPITAL_COMMUNITY): Payer: Self-pay | Admitting: Emergency Medicine

## 2020-04-03 ENCOUNTER — Emergency Department (HOSPITAL_COMMUNITY)
Admission: EM | Admit: 2020-04-03 | Discharge: 2020-04-03 | Disposition: A | Discharge status: Home / Self Care | Payer: Medicare Other | Attending: Emergency Medicine | Admitting: Emergency Medicine

## 2020-04-03 ENCOUNTER — Other Ambulatory Visit: Payer: Self-pay

## 2020-04-03 DIAGNOSIS — Z794 Long term (current) use of insulin: Secondary | ICD-10-CM | POA: Diagnosis not present

## 2020-04-03 DIAGNOSIS — W01190A Fall on same level from slipping, tripping and stumbling with subsequent striking against furniture, initial encounter: Secondary | ICD-10-CM | POA: Diagnosis not present

## 2020-04-03 DIAGNOSIS — N183 Chronic kidney disease, stage 3 unspecified: Secondary | ICD-10-CM | POA: Diagnosis not present

## 2020-04-03 DIAGNOSIS — Y92013 Bedroom of single-family (private) house as the place of occurrence of the external cause: Secondary | ICD-10-CM | POA: Diagnosis not present

## 2020-04-03 DIAGNOSIS — S40012A Contusion of left shoulder, initial encounter: Secondary | ICD-10-CM | POA: Diagnosis not present

## 2020-04-03 DIAGNOSIS — S0101XA Laceration without foreign body of scalp, initial encounter: Secondary | ICD-10-CM | POA: Insufficient documentation

## 2020-04-03 DIAGNOSIS — Z79899 Other long term (current) drug therapy: Secondary | ICD-10-CM | POA: Insufficient documentation

## 2020-04-03 DIAGNOSIS — S40022A Contusion of left upper arm, initial encounter: Secondary | ICD-10-CM

## 2020-04-03 DIAGNOSIS — I129 Hypertensive chronic kidney disease with stage 1 through stage 4 chronic kidney disease, or unspecified chronic kidney disease: Secondary | ICD-10-CM | POA: Diagnosis not present

## 2020-04-03 DIAGNOSIS — M79645 Pain in left finger(s): Secondary | ICD-10-CM

## 2020-04-03 DIAGNOSIS — E1140 Type 2 diabetes mellitus with diabetic neuropathy, unspecified: Secondary | ICD-10-CM | POA: Diagnosis not present

## 2020-04-03 DIAGNOSIS — Y92009 Unspecified place in unspecified non-institutional (private) residence as the place of occurrence of the external cause: Secondary | ICD-10-CM

## 2020-04-03 DIAGNOSIS — W19XXXA Unspecified fall, initial encounter: Secondary | ICD-10-CM

## 2020-04-03 MED ORDER — LIDOCAINE-EPINEPHRINE-TETRACAINE (LET) TOPICAL GEL
3.0000 mL | Freq: Once | TOPICAL | Status: AC
  Administered 2020-04-03: 3 mL via TOPICAL
  Filled 2020-04-03: qty 3

## 2020-04-03 MED ORDER — TRAMADOL HCL 50 MG PO TABS: 50.0000 mg | ORAL_TABLET | Freq: Four times a day (QID) | ORAL | 0 refills | Status: DC | PRN

## 2020-04-03 NOTE — ED Provider Notes (Signed)
[facility] EMERGENCY DEPARTMENT Provider Note   CSN: [id number] Arrival date & time: 04/03/20  0541   Time seen 6:11 AM  History Chief Complaint  Patient presents with   Stacy Webb is a 74 y.o. female.  HPI   Patient states she was getting back in bed this morning and she had regular socks on while walking on her tile floor and she slipped and fell hitting her head on the dresser.  She did not have loss of consciousness.  She is complaining of pain in her left shoulder and her left little finger and some soreness of her head. She thinks her tetanus is up-to-date.   PCP [name], MD   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[place])    Diabetes mellitus without complication ([place])    Excessive hair growth 07/19/2015   Heart murmur    History of kidney stones    Hyperlipidemia    Hypertension    Neuromuscular disorder ([place])    diabetic neuropathy   Neuropathy    Postmenopausal 07/19/2015    Patient Active Problem List   Diagnosis Date Noted   Absolute anemia 10/30/2017   Constipation 06/10/2017   Hemorrhoids 06/10/2017   Cystocele with rectocele 06/10/2017   Rectocele 06/10/2017   Diabetic foot ulcer ([place]) 09/27/2016   CKD (chronic kidney disease), stage III ([place]) 09/27/2016   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) 09/27/2016   Hyperlipidemia 09/27/2016   Hypertension 09/27/2016   Diabetic ulcer of foot associated with diabetes mellitus due to underlying condition, limited to breakdown of skin ([place]) 09/27/2016   Postmenopausal 07/19/2015   Essential hypertension, benign 08/11/2012   High cholesterol 08/11/2012   Family hx of colon cancer 08/11/2012   Diabetes ([place]) 08/11/2012    Past Surgical History:  Procedure Laterality Date   ANTERIOR AND POSTERIOR REPAIR N/A 04/28/2018   Procedure: ANTERIOR (CYSTOCELE) AND POSTERIOR  REPAIR (RECTOCELE);  Surgeon: Jonnie Kind, MD;  Location: [facility] ORS;  Service: Gynecology;  Laterality: N/A;   APPENDECTOMY     CARPAL TUNNEL RELEASE Bilateral    CESAREAN SECTION     COLONOSCOPY N/A 08/28/2012   Procedure: COLONOSCOPY;  Surgeon: Rogene Houston, MD;  Location: [facility] ENDO SUITE;  Service: Endoscopy;  Laterality: N/A;  915   COLONOSCOPY N/A 12/25/2017   Procedure: COLONOSCOPY;  Surgeon: Rogene Houston, MD;  Location: [facility] END[facility] SU[facility];  Service: Endoscopy;  Laterality: N/A;  12:45     OB History    Gravida  2   Para  2   Term  2   Preterm      AB      Living  1     SAB      IAB      Ectopic      Multiple  1   Live Births  3           Family History  Problem Relation Age of Onset   Pneumonia Son    Suicidality Son    Diabetes Daughter    Colon cancer Paternal Aunt     Social History   Tobacco Use   Smoking status: Never Smoker   Smokeless tobacco: Never Used  Scientific laboratory technician Use: Never used  Substance Use Topics   Alcohol use: No   Drug use: No  Home Medications Prior to Admission medications   Medication Sig Start Date End Date Taking? Authorizing Provider  ACCU-CHEK AVIVA PLUS test strip  12/05/18   [provider]  acetaminophen (TYLENOL) 500 MG tablet Take 500 mg by mouth every 6 (six) hours as needed for moderate pain or headache.     [provider]  atorvastatin (LIPITOR) 20 MG tablet Take 20 mg by mouth daily.    [provider]  diltiazem (CARDIZEM CD) 360 MG 24 hr capsule Take 360 mg by mouth daily.     [provider]  gabapentin (NEURONTIN) 100 MG capsule Take 100 mg by mouth 2 (two) times daily.     [provider]  hydrochlorothiazide (HYDRODIURIL) 25 MG tablet Take 25 mg by mouth daily.  01/07/18   [provider]  losartan (COZAAR) 50 MG tablet Take 50 mg by mouth daily.    [provider]  metFORMIN (GLUCOPHAGE) 1000 MG tablet Take 1,000 mg  by mouth 2 (two) times daily with a meal.    [provider]  mupirocin ointment (BACTROBAN) 2 % Apply 1 application topically 2 (two) times daily. Patient not taking: Reported on 11/02/2019 11/23/18   Wurst, [place], PA-C  polyethylene glycol powder (MIRALAX) powder Take 17 g by mouth daily. To prevent constipation 04/29/18   Jonnie Kind, MD  vitamin B-12 (CYANOCOBALAMIN) 500 MCG tablet Take 500 mcg by mouth 2 (two) times daily.     [provider]  conjugated estrogens (PREMARIN) vaginal cream Place [id number] Applicatorfuls vaginally 3 (three) times a week. For vaginal thinning and irritation in preop timeframe Patient not taking: Reported on 06/21/2019 03/16/18 03/05/20  Jonnie Kind, MD  ferrous sulfate 325 (65 FE) MG tablet Take 325 mg by mouth daily with breakfast.  Patient not taking: Reported on 11/02/2019  03/05/20  [provider]    Allergies    Clindamycin/lincomycin and Enalapril  Review of Systems   Review of Systems  All other systems reviewed and are negative.   Physical Exam Updated Vital Signs BP (!) 182/81 (BP Location: Right Arm)    Pulse 83    Resp 16    Ht 5\' 6"  (1.676 m)    Wt 63 kg    SpO2 99%    BMI 22.42 kg/m   Physical Exam Vitals and nursing note reviewed.  Constitutional:      General: She is not in acute distress.    Appearance: Normal appearance. She is normal weight. She is not ill-appearing or toxic-appearing.  HENT:     Head: Normocephalic.     Comments: Patient has a 1 cm linear laceration in her left scalp as shown in the picture    Right Ear: External ear normal.     Left Ear: External ear normal.  Eyes:     Extraocular Movements: Extraocular movements intact.     Conjunctiva/sclera: Conjunctivae normal.     Pupils: Pupils are equal, round, and reactive to light.  Neck:     Comments: No localizing tenderness Cardiovascular:     Rate and Rhythm: Normal rate.  Pulmonary:     Effort: Pulmonary effort is normal. No  respiratory distress.  Musculoskeletal:     Cervical back: Normal range of motion. Tenderness present.     Comments: Patient complains of some stiffness in her left little finger, there is no bruising or obvious deformity seen.  She also has some tenderness of her proximal humerus, there is no deformity or swelling felt.  Her clavicle is nontender.  She has good distal pulses and capillary refill.  Skin:    General: Skin is warm and dry.  Neurological:     General: No focal deficit present.     Mental Status: She is alert and oriented to person, place, and time.     Cranial Nerves: No cranial nerve deficit.  Psychiatric:        Mood and Affect: Mood normal.        Behavior: Behavior normal.        Thought Content: Thought content normal.       ED Results / Procedures / Treatments   Labs (all labs ordered are listed, but only abnormal results are displayed) Labs Reviewed - No data to display  EKG None  Radiology No results found.  Procedures .Marland KitchenLaceration Repair  Date/Time: 04/03/2020 7:20 AM Performed by: Rolland Porter, MD Authorized by: Rolland Porter, MD   Consent:    Consent obtained:  Verbal   Consent given by:  Patient Universal protocol:    Immediately prior to procedure, a time out was called: yes     Patient identity confirmed:  Verbally with patient and arm band Anesthesia:    Anesthesia method:  Topical application   Topical anesthetic:  LET Laceration details:    Location:  Scalp   Scalp location:  Crown   Length (cm):  1 Pre-procedure details:    Preparation:  Patient was prepped and draped in usual sterile fashion Exploration:    Hemostasis achieved with:  Direct pressure   Wound extent: no foreign bodies/material noted     Contaminated: no   Treatment:    Area cleansed with:  Saline   Amount of cleaning:  Standard Skin repair:    Repair method:  Staples   Number of staples:  2 Approximation:    Approximation:  Close Repair type:    Repair type:   Simple Post-procedure details:    Dressing:  Antibiotic ointment   (including critical care time)  Medications Ordered in ED Medications  lidocaine-EPINEPHrine-tetracaine (LET) topical gel (3 mLs Topical Given 04/03/20 [id number])    ED Course  I have reviewed the triage vital signs and the nursing notes.  Pertinent labs & imaging results that were available during my care of the patient were reviewed by me and considered in my medical decision making (see chart for details).    MDM Rules/Calculators/A&P                          CT of the head and cervical spine was ordered as well as left shoulder and left index finger x-rays.  LET was placed on her wound and we discussed stapling her laceration.  7:00 AM I went in the room to do her staples and patient was drinking out of a cup.  She had been advised by myself and nursing staff not to drink until after she had her CT scan.  As I was finished doing her staples radiology came to get her for her studies.  7:15 AM radiology called me and patient told them that it was her ring finger that was hurting.  Her x-ray was changed from little finger to the ring finger.  During my exam she has multiple rings on her ring finger.  They were instructed to remove her rings.  Patient turned over to Dr. Rogene Houston at change of shift to get the results of her scans and x-rays.  Final Clinical  Impression(s) / ED Diagnoses Final diagnoses:  Fall in home, initial encounter  Laceration of occipital region of scalp, initial encounter  Contusion of multiple sites of left shoulder and upper arm, initial encounter  Pain of finger of left hand    Rx / DC Orders  Disposition pending  Devoria Albe, MD, Concha Pyo, MD 04/03/20 [phone]

## 2020-04-03 NOTE — ED Triage Notes (Signed)
Pt fell out of bed with morning, hitting the left side of head and left shoulder. Pt has laceration to left of head, bleeding controlled at this time. Pt also c/o left ring and pinky finger pain.

## 2020-04-03 NOTE — ED Notes (Addendum)
Pt's head wound cleaned with saline. Bleeding controlled at this time. Pt states pain is 3/10, but "not too bad right now."  Pt denied any LOC or visual disturbances. Call bell within reach, bed in low position. Will continue to monitor.

## 2020-04-03 NOTE — ED Notes (Signed)
Signature pad in room not working. Pt received discharge instructions and verbalized understanding.

## 2020-04-03 NOTE — Discharge Instructions (Addendum)
CT head and neck without any acute findings.  X-ray of left shoulder and left hand without any acute bony injuries.  But since there is a lot of discomfort in the left shoulder area.  There could be rotator cuff injury.  We'll treat the shoulder with a sling for comfort.  Take the tramadol as needed for pain.  Follow-up with Dr. Michelle Nasuti office to have the staples removed from the scalp laceration in 7 days.  Also would recommend following up with Dr. Mort Sawyers office regarding the shoulder injury.

## 2020-04-03 NOTE — ED Provider Notes (Signed)
Patient's radiology studies CT head neck without any acute findings.  X-ray left shoulder and left hand without any acute abnormalities.  Patient does have a lot of discomfort in the left shoulder area.  Could have a rotator cuff injury.  Will treat with shoulder immobilizer have her follow-up with orthopedics.  Staple removal can be done by her primary care doctor in 7 days.  Patient given tramadol for the pain.  Patient stable for discharge home   Fredia Sorrow, MD 04/03/20 [phone]

## 2020-04-06 ENCOUNTER — Ambulatory Visit (INDEPENDENT_AMBULATORY_CARE_PROVIDER_SITE_OTHER): Payer: Medicare Other | Admitting: Orthopedic Surgery

## 2020-04-06 ENCOUNTER — Other Ambulatory Visit: Payer: Self-pay

## 2020-04-06 ENCOUNTER — Encounter: Payer: Self-pay | Admitting: Orthopedic Surgery

## 2020-04-06 VITALS — BP 122/74 | HR 86 | Ht 66.0 in | Wt 128.0 lb

## 2020-04-06 DIAGNOSIS — M25512 Pain in left shoulder: Secondary | ICD-10-CM | POA: Diagnosis not present

## 2020-04-06 DIAGNOSIS — G8929 Other chronic pain: Secondary | ICD-10-CM | POA: Diagnosis not present

## 2020-04-06 MED ORDER — IBUPROFEN 800 MG PO TABS: 800.0000 mg | ORAL_TABLET | Freq: Three times a day (TID) | ORAL | 0 refills | Status: DC | PRN

## 2020-04-06 NOTE — Patient Instructions (Addendum)
Schedule appoint with Dr. Amedeo Kinsman for possible shoulder replacement  Reverse Total Shoulder Replacement Reverse total shoulder replacement is a surgical procedure to replace the shoulder joint. You may need this surgery if your rotator cuff is torn and cannot be repaired. The rotator cuff is a group of muscles and tough, cord-like tissues that connect muscle to bone (tendons) in the shoulder joint. The rotator cuff helps you lift your arm. You may also need a reverse total shoulder replacement if you have:  A previously unsuccessful normal shoulder replacement.  Severe pain that keeps you from lifting your arm.  A severe fracture of your shoulder joint.  Repeated dislocations of your shoulder joint.  A tumor in your shoulder joint. The shoulder is a ball-and-socket joint. The top of the upper arm bone (humerus) is shaped like a ball, and it fits into the socket of the shoulder blade (scapula). During a normal shoulder replacement, a plastic cup replaces the socket, and a metal ball replaces the ball of the humerus. This allows the rotator cuff to lift the arm, like it normally does. During a reverse total shoulder replacement, the plastic socket is placed into the top of the humerus, and the metal ball is placed into the shoulder socket. This means that the positions of the ball and socket are reversed. This lets you use other shoulder muscles to lift your arm, instead of using the rotator cuff. Tell a health care provider about:  Any allergies you have.  All medicines you are taking, including vitamins, herbs, eye drops, creams, and over-the-counter medicines.  Any problems you or family members have had with anesthetic medicines.  Any blood disorders you have.  Any surgeries you have had.  Any medical conditions you have.  Whether you are pregnant or may be pregnant. What are the risks? Generally, this is a safe procedure. However, problems may occur,  including:  Infection.  Bleeding.  Allergic reactions to medicines.  Damage to other structures and organs, such as blood vessels or nerves. Nerve damage can cause tingling, weakness, or numbness.  The ball and socket coming apart (dislocation).  Shoulder pain.  Poor return of shoulder movement.  Loosening of the new shoulder parts over time, which may require replacement. What happens before the procedure? Medicines  Ask your health care provider about: ? Changing or stopping your regular medicines. This is especially important if you are taking diabetes medicines or blood thinners. ? Taking medicines such as aspirin and ibuprofen. These medicines can thin your blood. Do not take these medicines before your procedure if your health care provider instructs you not to.  You may be given antibiotic medicine to help prevent infection. Staying hydrated Follow instructions from your health care provider about hydration, which may include:  Up to 2 hours before the procedure - you may continue to drink clear liquids, such as water, clear fruit juice, black coffee, and plain tea. Eating and drinking restrictions Follow instructions from your health care provider about eating and drinking, which may include:  8 hours before the procedure - stop eating heavy meals or foods such as meat, fried foods, or fatty foods.  6 hours before the procedure - stop eating light meals or foods, such as toast or cereal.  6 hours before the procedure - stop drinking milk or drinks that contain milk.  2 hours before the procedure - stop drinking clear liquids. General instructions  You may have tests, such as: ? Blood tests. ? Chest X-rays. ? Heart  tests.  Do not use any products that contain nicotine or tobacco, such as cigarettes and e-cigarettes. If you need help quitting, ask your health care provider.  Plan to have someone take you home from the hospital or clinic.  Plan to have someone  help around the house for a few weeks after your procedure. What happens during the procedure?  To reduce your risk of infection: ? Your health care team will wash or sanitize their hands. ? Your skin will be washed with soap.  An IV tube will be inserted into one of your veins.  You will be given one or more of the following: ? A medicine that helps you relax (sedative). ? A medicine that makes you fall asleep (general anesthetic). ? A medicine that is injected into an area of your body to numb everything below the injection site (regional anesthetic).  Small monitors will be put on your body to check your heart, blood pressure, and oxygen level.  An incision will be made in the front or the top of your shoulder.  Your shoulder joint will be opened, and the ball of the humerus will be removed from the socket.  Your shoulder joint will be cleaned out and prepared for the replacement.  A metal plate will be screwed into your scapula. Then, a metal ball will be screwed onto the plate.  The plastic socket will be inserted into the top of your humerus and held in place.  The plastic socket will be positioned onto the metal ball and fixed into place.  Your incision will be closed with stitches (sutures) or staples.  Your incision will be covered with a bandage (dressing) or other wound covering.  Your arm will be put in a sling. This will keep your arm still while it heals. The procedure may vary among health care providers and hospitals. What happens after the procedure?  Your blood pressure, heart rate, breathing rate, and blood oxygen level will be monitored until the medicines you were given have worn off.  You may continue to receive fluids and medicines, such as pain medicines or antibiotics, through an IV tube.  You will be shown shoulder exercises to do at home.  Do not drive for 24 hours if you received a sedative. Ask your health care provider when it is safe for you to  drive. This information is not intended to replace advice given to you by your health care provider. Make sure you discuss any questions you have with your health care provider. Document Revised: 11/16/2015 Document Reviewed: 09/26/2015 Elsevier Patient Education  2020 [employer].

## 2020-04-06 NOTE — Progress Notes (Signed)
Patient ID: Stacy Webb, female   DOB: 1945-04-14, 74 y.o.   MRN: [id number]  ASSESSMENT AND PLAN:  74 year old female diabetes mellitus but no other major medical problems chronic shoulder pain fell again injured her left shoulder complains of decreased range of motion increasing pain.  I gave her an option of therapy injection medication versus replacement she opted to see the shoulder specialist for possible replacement of the left shoulder    Chief Complaint  Patient presents with  . Shoulder Pain    Patient reports had a fall, 04/03/20 and her shoulder has been hurting worse since,       HPI Stacy Webb is a 74 y.o. female.  Presents for evaluation of left shoulder pain    Review of Systems Review of Systems  Constitutional: Negative for chills and fever.  Musculoskeletal: Positive for neck pain.  Skin: Negative.     Past Medical History:  Diagnosis Date  . Anemia   . Anxiety   . Arthritis    fingers  . 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)   . Diabetes mellitus without complication ([facility])   . Excessive hair growth 07/19/2015  . Heart murmur   . History of kidney stones   . Hyperlipidemia   . Hypertension   . Neuromuscular disorder ([facility])    diabetic neuropathy  . Neuropathy   . Postmenopausal 07/19/2015    Past Surgical History:  Procedure Laterality Date  . ANTERIOR AND POSTERIOR REPAIR N/A 04/28/2018   Procedure: ANTERIOR (CYSTOCELE) AND POSTERIOR REPAIR (RECTOCELE);  Surgeon: Tilda Burrow, MD;  Location: [facility];  Service: Gynecology;  Laterality: N/A;  . APPENDECTOMY    . CARPAL TUNNEL RELEASE Bilateral 2001  . CESAREAN SECTION    . COLONOSCOPY N/A 08/28/2012   Procedure: COLONOSCOPY;  Surgeon: Malissa Hippo, MD;  Location: [facility];  Service: Endoscopy;  Laterality: N/A;  915  . COLONOSCOPY N/A 12/25/2017   Procedure: COLONOSCOPY;  Surgeon: Malissa Hippo, MD;  Location: [facility];  Service: Endoscopy;  Laterality: N/A;   12:45    Family History  Problem Relation Age of Onset  . Pneumonia Son   . Suicidality Son   . Diabetes Daughter   . Colon cancer Paternal Aunt      Social History   Tobacco Use  . Smoking status: Never Smoker  . Smokeless tobacco: Never Used  Vaping Use  . Vaping Use: Never used  Substance Use Topics  . Alcohol use: No  . Drug use: No    Allergies  Allergen Reactions  . Clindamycin/Lincomycin Other (See Comments)    Mouth taste like metal  . Enalapril Other (See Comments) and Cough    Constant Cough    Allergies  Allergen Reactions  . Clindamycin/Lincomycin Other (See Comments)    Mouth taste like metal  . Enalapril Other (See Comments) and Cough    Constant Cough     Current Meds  Medication Sig  . ACCU-CHEK AVIVA PLUS test strip   . acetaminophen (TYLENOL) 500 MG tablet Take 500 mg by mouth every 6 (six) hours as needed for moderate pain or headache.   Marland Kitchen atorvastatin (LIPITOR) 20 MG tablet Take 20 mg by mouth daily.  Marland Kitchen diltiazem (CARDIZEM CD) 360 MG 24 hr capsule Take 360 mg by mouth daily.   Marland Kitchen gabapentin (NEURONTIN) 100 MG capsule Take 100 mg by mouth 2 (two) times daily.   . hydrochlorothiazide (HYDRODIURIL) 25 MG tablet Take 25 mg  by mouth daily.   Marland Kitchen ibuprofen (ADVIL) 800 MG tablet Take 1 tablet (800 mg total) by mouth every 8 (eight) hours as needed.  Marland Kitchen losartan (COZAAR) 50 MG tablet Take 50 mg by mouth daily.  . metFORMIN (GLUCOPHAGE) 1000 MG tablet Take 1,000 mg by mouth 2 (two) times daily with a meal.  . mupirocin ointment (BACTROBAN) 2 % Apply 1 application topically 2 (two) times daily.  . polyethylene glycol powder (MIRALAX) powder Take 17 g by mouth daily. To prevent constipation  . traMADol (ULTRAM) 50 MG tablet Take 1 tablet (50 mg total) by mouth every 6 (six) hours as needed.  . vitamin B-12 (CYANOCOBALAMIN) 500 MCG tablet Take 500 mcg by mouth 2 (two) times daily.        Physical Exam BP 122/74   Pulse 86   Ht 5\' 6"  (1.676 m)   Wt  128 lb (58.1 kg)   BMI 20.66 kg/m   Ambulatory status normal with no assistive devices  GENERAL : APPEARANCE IS NORMAL GROOMING IS GOOD  NORMAL MOOD AND AFFECT  AWAKE ALERT AND ORIENTED X 3   Left SHOULDER  TENDERNESS with a bruise over the deltoid ROM she has painful active motion 90 degrees abduction up to 110 and passive 90 degrees abduction up to 140 active flexion is about 90 degrees STABLE ANTERIORLY POSTERIORLY AND INFERIORLY SKIN normal without rash erythema or ulceration   ON THE OTHER SIDE THE RIGHT  SHOULDER HAS NORMAL SKIN, NO ROM DEFICITS, EXCELLENT STABILITY, AND NORMAL 5/5 MMT STRENGTH   MEDICAL DECISION MAKING  A.  Encounter Diagnosis  Name Primary?  . Chronic left shoulder pain Yes    B. DATA ANALYSED:   IMAGING: Independent interpretation of images: She did have x-rays of her left shoulder on December 27 that showed a decreased humeral head to acromial distance with spurring and arthritic changes of the [facility] joint moderate glenohumeral joint mild without acute fracture  CLINICAL DATA:  [place] at home. Left shoulder pain.   EXAM: LEFT SHOULDER - 2+ VIEW   COMPARISON:  None.   FINDINGS: Moderate glenohumeral and AC joint degenerative changes. No fracture or dislocation. Marked narrowing of the humeroacromial space could be due to rotator cuff tear. The visualized left ribs are intact and the visualized left lung is grossly clear.   IMPRESSION: Moderate degenerative changes but no acute bony findings.     Electronically Signed   By: Marijo Sanes M.D.   On: 04/03/2020 07:40  C. MANAGEMENT  Scheduled to see Dr. Amedeo Kinsman for possible shoulder replacement   Meds ordered this encounter  Medications  . ibuprofen (ADVIL) 800 MG tablet    Sig: Take 1 tablet (800 mg total) by mouth every 8 (eight) hours as needed.    Dispense:  30 tablet    Refill:  0

## 2020-04-10 ENCOUNTER — Ambulatory Visit (HOSPITAL_COMMUNITY): Payer: Medicare Other

## 2020-04-14 ENCOUNTER — Other Ambulatory Visit: Payer: Self-pay

## 2020-04-14 ENCOUNTER — Encounter: Payer: Self-pay | Admitting: Orthopedic Surgery

## 2020-04-14 ENCOUNTER — Ambulatory Visit (INDEPENDENT_AMBULATORY_CARE_PROVIDER_SITE_OTHER): Payer: Medicare Other | Admitting: Orthopedic Surgery

## 2020-04-14 VITALS — BP 157/76 | HR 84 | Ht 66.0 in | Wt 130.0 lb

## 2020-04-14 DIAGNOSIS — M12812 Other specific arthropathies, not elsewhere classified, left shoulder: Secondary | ICD-10-CM

## 2020-04-14 NOTE — Progress Notes (Signed)
New Patient Visit  Assessment: Stacy Webb is a 75 y.o. female with the following: Left shoulder rotator cuff arthropathy  Plan: Stacy Webb has advanced rotator cuff arthropathy.  She has attempted multiple nonoperative treatment options including medications, PT and multiple injections without sustained relief.  She is complaining of pain and decreased function and mobility.  As a result, I have recommended a left reverse shoulder arthroplasty.  The procedure and recovery was discussed in great detail.  All questions were answered.  Risks and benefits of the surgery, including, but not limited to infection, bleeding, persistent pain, need for further surgery, non-union, malunion and more severe complications associated with anesthesia were discussed with the patient.  The patient has elected to proceed.  She is at increased risk for healing complications and surgical site infection postop due to her history of diabetes.   She will obtain medical clearance, to include a recent hemoglobin A1c.  Once medical clearance is obtained, we will schedule a CT scan and finalize a surgical date.   Surgical Plan  Procedure:  Left shoulder reverse arthroplasty Disposition: Over night observation PRN  Anesthesia: General and block Medical Comorbidities: Diabetes Notes: To be scheduled after medical clearance.   Follow-up: Return for After medical clearance for OR.  Subjective:  Chief Complaint  Patient presents with  . Shoulder Pain    L/ hurting real bad this morning. Here to discuss surgery.    History of Present Illness: Stacy Webb is a 75 y.o. RHD female who presents for evaluation of her left shoulder.  She has had progressively worsening pain and dysfunction in her left shoulder.  She has been followed by Dr. Aline Brochure for this, and was previously evaluated by Dr. Marlou Sa for a second opinion.  She denies a specific injury.  She has difficulty lifting her arm above her shoulder level.   She also has pain in her left shoulder, and has been taking tylenol and ibuprofen.  She reports falling on to her left shoulder 04/03/20 and since then has taken some tramadol for pain, but this makes her very sleepy.  She has had multiple steroid injections with varying results.  PT and associated exercises have not improved her symptoms.    Review of Systems: No fevers or chills No chest pain No shortness of breath No bowel or bladder dysfunction No GI distress No headaches   Medical History:  Past Medical History:  Diagnosis Date  . Anemia   . Anxiety   . Arthritis    fingers  . 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[place])   . Diabetes mellitus without complication ([place])   . Excessive hair growth 07/19/2015  . Heart murmur   . History of kidney stones   . Hyperlipidemia   . Hypertension   . Neuromuscular disorder ([place])    diabetic neuropathy  . Neuropathy   . Postmenopausal 07/19/2015    Past Surgical History:  Procedure Laterality Date  . ANTERIOR AND POSTERIOR REPAIR N/A 04/28/2018   Procedure: ANTERIOR (CYSTOCELE) AND POSTERIOR REPAIR (RECTOCELE);  Surgeon: Jonnie Kind, MD;  Location: [facility];  Service: Gynecology;  Laterality: N/A;  . APPENDECTOMY    . CARPAL TUNNEL RELEASE Bilateral 2001  . CESAREAN SECTION    . COLONOSCOPY N/A 08/28/2012   Procedure: COLONOSCOPY;  Surgeon: Rogene Houston, MD;  Location: [facility];  Service: Endoscopy;  Laterality: N/A;  915  . COLONOSCOPY N/A 12/25/2017   Procedure: COLONOSCOPY;  Surgeon: Rogene Houston, MD;  Location: [facility];  Service: Endoscopy;  Laterality: N/A;  12:45    Family History  Problem Relation Age of Onset  . Pneumonia Son   . Suicidality Son   . Diabetes Daughter   . Colon cancer Paternal Aunt    Social History   Tobacco Use  . Smoking status: Never Smoker  . Smokeless tobacco: Never Used  Vaping Use  . Vaping Use: Never used  Substance Use Topics  . Alcohol use: No  . Drug use: No     Allergies  Allergen Reactions  . Clindamycin/Lincomycin Other (See Comments)    Mouth taste like metal  . Enalapril Other (See Comments) and Cough    Constant Cough    Current Meds  Medication Sig  . ACCU-CHEK AVIVA PLUS test strip   . acetaminophen (TYLENOL) 500 MG tablet Take 500 mg by mouth every 6 (six) hours as needed for moderate pain or headache.   Marland Kitchen atorvastatin (LIPITOR) 20 MG tablet Take 20 mg by mouth daily.  Marland Kitchen diltiazem (CARDIZEM CD) 360 MG 24 hr capsule Take 360 mg by mouth daily.   Marland Kitchen gabapentin (NEURONTIN) 100 MG capsule Take 100 mg by mouth 2 (two) times daily.   . hydrochlorothiazide (HYDRODIURIL) 25 MG tablet Take 25 mg by mouth daily.   Marland Kitchen ibuprofen (ADVIL) 800 MG tablet Take 1 tablet (800 mg total) by mouth every 8 (eight) hours as needed.  Marland Kitchen losartan (COZAAR) 50 MG tablet Take 50 mg by mouth daily.  . metFORMIN (GLUCOPHAGE) 1000 MG tablet Take 1,000 mg by mouth 2 (two) times daily with a meal.  . mupirocin ointment (BACTROBAN) 2 % Apply 1 application topically 2 (two) times daily.  . polyethylene glycol powder (MIRALAX) powder Take 17 g by mouth daily. To prevent constipation  . traMADol (ULTRAM) 50 MG tablet Take 1 tablet (50 mg total) by mouth every 6 (six) hours as needed.  . vitamin B-12 (CYANOCOBALAMIN) 500 MCG tablet Take 500 mcg by mouth 2 (two) times daily.     Objective: BP (!) 157/76   Pulse 84   Ht 5\' 6"  (1.676 m)   Wt 130 lb (59 kg)   BMI 20.98 kg/m   Physical Exam:  General: Alert and oriented, no acute distress Gait: Normal  Left shoulder without deformity.  Ecchymosis over lateral shoulder.  Sensation intact in axillary patch.  Forward flexion limited to 100 degrees before posture changes.  IR to lumbar spine.  ER to 20 at her side.  Negative belly press.  4/5 strength SS, 4+/5 infraspinatus.   IMAGING: I personally reviewed images previously obtained in clinic   XR left shoulder demonstrates proximal humeral head is abutting the  acromion; degenerative changes within the glenoid.  No acute injury.  Cysts within humeral head.    New Medications:  No orders of the defined types were placed in this encounter.     [name], MD  04/14/2020 9:45 PM

## 2020-04-14 NOTE — Patient Instructions (Signed)
Recommend reverse shoulder arthroplasty  If you are interested in surgery at [facility], we would ask that you get medical clearance, including an updated hemoglobin A1C (if you do not have one already). Next, we would obtain a CT scan of your shoulder for surgical planning.    Once you have clearance, we could schedule surgery for about 2-4 weeks after that.   You can spend the night in hospital after surgery, if you prefer.

## 2020-04-18 ENCOUNTER — Telehealth: Payer: Self-pay | Admitting: Orthopedic Surgery

## 2020-04-18 DIAGNOSIS — M12812 Other specific arthropathies, not elsewhere classified, left shoulder: Secondary | ICD-10-CM

## 2020-04-18 NOTE — Telephone Encounter (Signed)
Note faxed to PCP office for surgical clearance.

## 2020-04-18 NOTE — Telephone Encounter (Signed)
Patient wants to move forward with having surgery.  I told her that she would need to speak with Dr. Vickey Sages office to get clearance and that we would be sending forms for him to give clearance.  From reading her last OV note, pt also needs to get a CT scan done before having the surgery.    Can you check to see if we have sent to Dr Karie Kirks for clearance and if we need to go ahead and schedule for CT scan?  Please let the patient know what she needs to do next  Thanks

## 2020-04-18 NOTE — Telephone Encounter (Signed)
Please advise 

## 2020-04-18 NOTE — Telephone Encounter (Signed)
I have placed the order for the CT Scan. I will enter the note for surgical clearance.

## 2020-04-18 NOTE — Addendum Note (Signed)
Addended by: Larena Glassman A on: 04/18/2020 07:22 PM   Modules accepted: Orders

## 2020-04-21 ENCOUNTER — Ambulatory Visit (HOSPITAL_COMMUNITY): Payer: Medicare Other

## 2020-04-27 ENCOUNTER — Telehealth: Payer: Self-pay | Admitting: Podiatry

## 2020-04-27 NOTE — Telephone Encounter (Signed)
Pt left voicemail asking if her shoes were in yet. She was told to call middle of January.  I returned call and left message that we have not received the shoes yet but I would call when they come in.(custom shoes)

## 2020-05-11 ENCOUNTER — Other Ambulatory Visit: Payer: Self-pay

## 2020-05-11 ENCOUNTER — Ambulatory Visit (HOSPITAL_COMMUNITY)
Admission: RE | Admit: 2020-05-11 | Discharge: 2020-05-11 | Disposition: A | Discharge status: Home / Self Care | Payer: Medicare Other | Source: Ambulatory Visit | Attending: Orthopedic Surgery | Admitting: Orthopedic Surgery

## 2020-05-11 DIAGNOSIS — M12812 Other specific arthropathies, not elsewhere classified, left shoulder: Secondary | ICD-10-CM | POA: Diagnosis present

## 2020-05-15 ENCOUNTER — Telehealth: Payer: Self-pay | Admitting: Radiology

## 2020-05-15 NOTE — Telephone Encounter (Signed)
Patient called to see if you have received the notes/clearance from Dr Karie Kirks?  She is wanting to proceed, hurting.  She has offered to go to them to pickup anything you [place] need.  Please call her to advise/discuss?  Thanks.

## 2020-05-15 NOTE — Telephone Encounter (Signed)
I called and spoke with the patient and she is understanding that surgeries are on hold at this time.   I let her know that I have left several message for Dr. Vickey Sages office and I have not received a call back. She understand and she may come back to their office to pick up the clearance form.

## 2020-05-16 NOTE — Telephone Encounter (Signed)
I re-faxed the surgical clearance to Dr. Vickey Sages office again today.

## 2020-05-30 NOTE — Telephone Encounter (Signed)
Patient has a follow up on 06/19/20 and will bring paperwork once this has been completed.

## 2020-05-30 NOTE — Telephone Encounter (Signed)
I called the patient and she has an appointment on 06/19/2020 and she will get clearance after that day and bring the paperwork in the office.

## 2020-06-01 ENCOUNTER — Inpatient Hospital Stay (HOSPITAL_COMMUNITY): Admission: RE | Admit: 2020-06-01 | Payer: Medicare Other | Source: Ambulatory Visit

## 2020-06-05 ENCOUNTER — Telehealth: Payer: Self-pay | Admitting: Podiatry

## 2020-06-05 NOTE — Telephone Encounter (Signed)
Pt left message about custom shoes that were ordered she understands she cannot get them until pcp signs paperwork, but wanted to know if I could mail paperwork to her so she can take it to the doctor..  I returned call and left message it will go out tomorrow to her home address in the system and to call if any further questions.

## 2020-06-20 ENCOUNTER — Telehealth: Payer: Self-pay | Admitting: Orthopedic Surgery

## 2020-06-20 NOTE — Telephone Encounter (Signed)
Patient called to ask about about her surgery clearance.    Would you call her?

## 2020-06-23 ENCOUNTER — Telehealth: Payer: Self-pay | Admitting: Podiatry

## 2020-06-23 NOTE — Telephone Encounter (Signed)
I tried to call her back about setting up a date and I was not able to leave a message.

## 2020-06-23 NOTE — Telephone Encounter (Signed)
Pt left message asking if we had gotten the paperwork for her diabetic shoes.  I returned call and it just rang. As I was trying her cell she called me back and is scheduled to see EJ on 3.25.2022.Marland Kitchen

## 2020-06-23 NOTE — Telephone Encounter (Signed)
We received surgical clearance and we are waiting on a response from the patient about a date for her surgery.

## 2020-06-26 NOTE — Telephone Encounter (Signed)
See other note. Closing encounter.  

## 2020-06-27 ENCOUNTER — Other Ambulatory Visit: Payer: Self-pay | Admitting: Orthopedic Surgery

## 2020-06-28 ENCOUNTER — Encounter (HOSPITAL_COMMUNITY)
Admission: RE | Disposition: A | Discharge status: Home / Self Care | Payer: Self-pay | Source: Ambulatory Visit | Attending: Orthopedic Surgery

## 2020-06-28 ENCOUNTER — Other Ambulatory Visit: Payer: Self-pay | Admitting: Orthopedic Surgery

## 2020-06-28 ENCOUNTER — Telehealth: Payer: Self-pay | Admitting: Orthopedic Surgery

## 2020-06-28 SURGERY — ARTHROPLASTY, SHOULDER, TOTAL, REVERSE
Anesthesia: Choice | Site: Shoulder | Laterality: Left

## 2020-06-28 NOTE — Telephone Encounter (Signed)
Closing note. See other phone note.

## 2020-06-28 NOTE — Telephone Encounter (Signed)
I have placed the orders for the surgery.

## 2020-06-28 NOTE — Telephone Encounter (Signed)
Patient called to inquire about the status of pre-op and surgery. Please advise. States okay to leave a message at cell [phone]

## 2020-06-30 ENCOUNTER — Other Ambulatory Visit: Payer: Self-pay

## 2020-06-30 ENCOUNTER — Ambulatory Visit: Payer: Medicare Other

## 2020-06-30 DIAGNOSIS — M2041 Other hammer toe(s) (acquired), right foot: Secondary | ICD-10-CM

## 2020-06-30 DIAGNOSIS — M2042 Other hammer toe(s) (acquired), left foot: Secondary | ICD-10-CM

## 2020-06-30 DIAGNOSIS — E1140 Type 2 diabetes mellitus with diabetic neuropathy, unspecified: Secondary | ICD-10-CM

## 2020-06-30 DIAGNOSIS — E1149 Type 2 diabetes mellitus with other diabetic neurological complication: Secondary | ICD-10-CM

## 2020-06-30 DIAGNOSIS — E1142 Type 2 diabetes mellitus with diabetic polyneuropathy: Secondary | ICD-10-CM

## 2020-06-30 DIAGNOSIS — M14679 Charcot's joint, unspecified ankle and foot: Secondary | ICD-10-CM

## 2020-07-04 ENCOUNTER — Ambulatory Visit (INDEPENDENT_AMBULATORY_CARE_PROVIDER_SITE_OTHER): Payer: Medicare Other | Admitting: Orthopedic Surgery

## 2020-07-04 ENCOUNTER — Other Ambulatory Visit: Payer: Self-pay

## 2020-07-04 ENCOUNTER — Encounter: Payer: Self-pay | Admitting: Orthopedic Surgery

## 2020-07-04 VITALS — Ht 66.0 in | Wt 131.0 lb

## 2020-07-04 DIAGNOSIS — M12812 Other specific arthropathies, not elsewhere classified, left shoulder: Secondary | ICD-10-CM

## 2020-07-04 NOTE — Progress Notes (Signed)
Orthopaedic Clinic Return  Assessment: Stacy Webb is a 75 y.o. 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  She cannot lift her arm above shoulder height.  She has had several injections in her left shoulder with limited improvement in her symptoms. The last injection was in July, 2021.  She is taking ibuprofen as needed currently.  She has attempted a home exercise program without significant improvement.  Radiographs demonstrate proximal humerus migration with the humerus abutting the acromion.  CT scan was obtained for preoperative planning and demonstrates evidence of chronic rotator cuff tear with moderate degenerative changes.   Stacy Webb is scheduled for left shoulder reverse arthroplasty 07/10/20.  She will plan to spend the night in the hospital for over night observation.  She was evaluated by her primary care provider and was cleared for surgery.  We placed a referral for PT, to being 1-2 weeks after surgery.   Risks and benefits of the surgery, including, but not limited to infection, bleeding, persistent pain, need for further surgery, non-union, malunion, dislocation, damage to surrounding structures including nerves and blood vessels, stiffness and more severe complications associated with anesthesia were discussed with the patient.  The patient has elected to proceed.  The patient is at an increased risk for infection and poor healing due to associated medical comorbidities including diabetes.   Surgical Plan  Procedure:  Left reverse shoulder arthroplasty Disposition: Over night observation Anesthesia: General + nerve block Medical Comorbidities: Diabetes (A1c = 6.5), anemia Notes: Clearance note from PCP in media tab   Body mass index is 21.14 kg/m.  Follow-up: Return for After surgery; 2 weeks after 07/10/20.   Subjective:  Chief Complaint  Patient  presents with  . Follow-up    Left shoulder and discuss surgery,    History of Present Illness: Stacy Webb is a 75 y.o. female who returns for preoperative evaluation and further discussion regarding her planned surgery.  She is scheduled for left shoulder reverse arthroplasty next week.  She continues to have pain and dysfunction of her left shoulder.  She takes ibuprofen occasionally.  Because of the pain, she states she does not use her left arm much.  The pain is deep within her left shoulder.  It gets worse with movement.  No numbness or tingling.  She states that she bumped her left shoulder recently, and has some swelling and bruising.  No issues otherwise.  No changes to her medical history.  Review of Systems: No fevers or chills No numbness or tingling No chest pain No shortness of breath No bowel or bladder dysfunction No GI distress No headaches  Objective: Ht 5\' 6"  (1.676 m)   Wt 131 lb (59.4 kg)   BMI 21.14 kg/m   Physical Exam:  Elderly female.  Alert and oriented.  No acute distress.  Evaluation of the left shoulder demonstrates some ecchymosis over the lateral aspect.  This is tender to palpation.  She also has a small amount of bruising into the upper arm with minimal tenderness.  No swelling or deformity is appreciated.  Active abduction is limited to approximately 60 degrees at her side.  Forward flexion limited to 70 degrees.  Sensation is intact over the axillary patch.  Her deltoid fires.  Sensation is intact in the M/U/R nerve distribution.  Active motion intact in the AIN/PIN/U nerve distribution.  IMAGING: I personally ordered and reviewed the following images:  No new imaging obtained  [name], MD 07/04/2020 9:10 AM

## 2020-07-04 NOTE — H&P (View-Only) (Signed)
Orthopaedic Clinic Return  Assessment: Stacy Webb is a 75 y.o. 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  She cannot lift her arm above shoulder height.  She has had several injections in her left shoulder with limited improvement in her symptoms. The last injection was in July, 2021.  She is taking ibuprofen as needed currently.  She has attempted a home exercise program without significant improvement.  Radiographs demonstrate proximal humerus migration with the humerus abutting the acromion.  CT scan was obtained for preoperative planning and demonstrates evidence of chronic rotator cuff tear with moderate degenerative changes.   Stacy Webb is scheduled for left shoulder reverse arthroplasty 07/10/20.  She will plan to spend the night in the hospital for over night observation.  She was evaluated by her primary care provider and was cleared for surgery.  We placed a referral for PT, to being 1-2 weeks after surgery.   Risks and benefits of the surgery, including, but not limited to infection, bleeding, persistent pain, need for further surgery, non-union, malunion, dislocation, damage to surrounding structures including nerves and blood vessels, stiffness and more severe complications associated with anesthesia were discussed with the patient.  The patient has elected to proceed.  The patient is at an increased risk for infection and poor healing due to associated medical comorbidities including diabetes.   Surgical Plan  Procedure:  Left reverse shoulder arthroplasty Disposition: Over night observation Anesthesia: General + nerve block Medical Comorbidities: Diabetes (A1c = 6.5), anemia Notes: Clearance note from PCP in media tab   Body mass index is 21.14 kg/m.  Follow-up: Return for After surgery; 2 weeks after 07/10/20.   Subjective:  Chief Complaint  Patient  presents with  . Follow-up    Left shoulder and discuss surgery,    History of Present Illness: Stacy Webb is a 75 y.o. female who returns for preoperative evaluation and further discussion regarding her planned surgery.  She is scheduled for left shoulder reverse arthroplasty next week.  She continues to have pain and dysfunction of her left shoulder.  She takes ibuprofen occasionally.  Because of the pain, she states she does not use her left arm much.  The pain is deep within her left shoulder.  It gets worse with movement.  No numbness or tingling.  She states that she bumped her left shoulder recently, and has some swelling and bruising.  No issues otherwise.  No changes to her medical history.  Review of Systems: No fevers or chills No numbness or tingling No chest pain No shortness of breath No bowel or bladder dysfunction No GI distress No headaches  Objective: Ht 5\' 6"  (1.676 m)   Wt 131 lb (59.4 kg)   BMI 21.14 kg/m   Physical Exam:  Elderly female.  Alert and oriented.  No acute distress.  Evaluation of the left shoulder demonstrates some ecchymosis over the lateral aspect.  This is tender to palpation.  She also has a small amount of bruising into the upper arm with minimal tenderness.  No swelling or deformity is appreciated.  Active abduction is limited to approximately 60 degrees at her side.  Forward flexion limited to 70 degrees.  Sensation is intact over the axillary patch.  Her deltoid fires.  Sensation is intact in the M/U/R nerve distribution.  Active motion intact in the AIN/PIN/U nerve distribution.  IMAGING: I personally ordered and reviewed the following images:  No new imaging obtained  [name], MD 07/04/2020 9:10 AM

## 2020-07-05 NOTE — Patient Instructions (Signed)
Stacy Webb  07/05/2020     @PREFPERIOPPHARMACY @   Your procedure is scheduled on 07/10/2020   Report to [facility] at  [email or web address]   Call this number if you have problems the morning of surgery:  [phone]   Remember:  Do not eat or drink after midnight.                       Take these medicines the morning of surgery with A SIP OF WATER  Diltiazem, gabapentin, tramadol (if needed).  DO NOT take any medications for diabetes the morning of your procedure.  If your glucose is 70 or below the morning of your procedure, drink 1/2 cup of clear juice and recheck your glucose in 15 minutes. If your glucose is still 70 or below, call [phone] for instructions.  If your glucose is 300 or above the morning of your procedure, call [phone] for instructions.      Do not wear jewelry, make-up or nail polish.  Do not wear lotions, powders, or perfumes, or deodorant.  Do not shave 48 hours prior to surgery.  Men may shave face and neck.  Do not bring valuables to the hospital.  [facility] is not responsible for any belongings or valuables.  Contacts, dentures or bridgework may not be worn into surgery.  Leave your suitcase in the car.  After surgery it may be brought to your room.  For patients admitted to the hospital, discharge time will be determined by your treatment team.  Patients discharged the day of surgery will not be allowed to drive home and must have someone with them for 24 hours.  Place clean sheets on your bed the night before your procedure and DO NOT sleep with pets this night.  Shower with CHG the night before and the morning of your procedure. DO NOT put CHG on your face, hair or genitals.  After each shower, dry off with a clean towel, put on clean, comfortable clothes and brush your teeth.     Special instructions:  DO NOT smoke or vape the morning of your procedure.    Please read over the following fact sheets that you were  given. Coughing and Deep Breathing, Surgical Site Infection Prevention, Anesthesia Post-op Instructions and Care and Recovery After Surgery       Reverse Total Shoulder Replacement, Care After This sheet gives you information about how to care for yourself after your procedure. Your health care provider may also give you more specific instructions. If you have problems or questions, contact your health care provider. What can I expect after the procedure? After the procedure, it is common to have:  Pain in the shoulder and arm.  Stiffness in the shoulder and arm. Follow these instructions at home: Medicines  Take over-the-counter and prescription medicines only as told by your health care provider.  Ask your health care provider if the medicine prescribed to you: ? Requires you to avoid driving or using machinery. ? Can cause constipation. You may need to take these actions to prevent or treat constipation:  Drink enough fluid to keep your urine pale yellow.  Take over-the-counter or prescription medicines.  Eat foods that are high in fiber, such as beans, whole grains, and fresh fruits and vegetables.  Limit foods that are high in fat and processed sugars, such as fried or sweet foods. If you have a sling:  Wear the sling as  told by your health care provider. Remove it only as told by your health care provider.  Check the skin around your sling every day. Tell your health care provider about any concerns.  Loosen the sling if your fingers tingle, become numb, or turn cold and blue.  Keep the sling clean and dry. Bathing  Do not take baths, swim, or use a hot tub until your health care provider approves. Ask your health care provider if you may take showers. You may only be allowed to take sponge baths.  If the sling is not waterproof: ? Do not let it get wet. ? Cover it with a watertight covering when you take a bath or shower.  Keep your bandage (dressing) dry until  your health care provider says it can be removed. Incision care  Follow instructions from your health care provider about how to take care of your incision. Make sure you: ? Wash your hands with soap and water for at least 20 seconds before and after you change your dressing. If soap and water are not available, use hand sanitizer. ? Change your dressing as told by your health care provider. ? Leave stitches (sutures), skin glue, or adhesive strips in place. These skin closures may need to stay in place for 2 weeks or longer. If adhesive strip edges start to loosen and curl up, you may trim the loose edges. Do not remove adhesive strips completely unless your health care provider tells you to do that.  Check your incision area every day for signs of infection. Check for: ? More redness, swelling, or pain. ? Fluid or blood. ? Warmth. ? Pus or a bad smell.   Managing pain, stiffness, and swelling  If directed, put ice on your shoulder. To do this: ? If you have a removable sling, remove it as told by your health care provider. ? Put ice in a plastic bag. ? Place a towel between your skin and the bag. ? Leave the ice on for 20 minutes, 2-3 times a day. ? Remove the ice if your skin turns bright red. This is very important. If you cannot feel pain, heat, or cold, you have a greater risk of damage to the area.  Move your fingers and hand often to reduce stiffness and swelling.   Driving  If you were given a sedative during the procedure, it can affect you for several hours. Do not drive or operate machinery until your health care provider says that it is safe.  Ask your health care provider when it is safe to drive if you have a sling on your arm. Activity  Return to your normal activities as told by your health care provider. Ask your health care provider what activities are safe for you.  Do shoulder exercises as told by your health care provider.  Do not lift your arm above shoulder  level until your health care provider approves.  Do not make large movements with your arm.  Do not push or pull things until your health care provider approves.  Do not lift anything that is heavier than 5 lb (2.3 kg) until your health care provider says that it is safe. General instructions  Do not use any products that contain nicotine or tobacco, such as cigarettes, e-cigarettes, and chewing tobacco. These can delay healing after surgery. If you need help quitting, ask your health care provider.  Tell your health care provider if you plan to have dental work. Also: ? Tell your  dentist about your joint replacement. ? Ask your health care provider if there are any special instructions you need to follow before having dental care and routine cleanings.  Keep all follow-up visits. This is important. Contact a health care provider if:  You feel nauseous or you vomit.  Your arm tingles or feels numb.  Your pain gets worse, even after taking pain medicine.  You have any of these signs of infection: ? More redness, swelling, or pain around your incision. ? Fluid or blood coming from your incision. ? Warmth coming from your incision. ? Pus or a bad smell coming from your incision. ? A fever. Get help right away if:  Your shoulder joint moves out of place.  Your incision comes apart.  You have redness, swelling, pain, or warmth in your leg or arm.  You have chest pain or shortness of breath. These symptoms may represent a serious problem that is an emergency. Do not wait to see if the symptoms will go away. Get medical help right away. Call your local emergency services (911 in the [place] Do not drive yourself to the hospital. Summary  After the procedure, it is common to have some pain and stiffness.  Take over-the-counter and prescription medicines only as told by your health care provider.  Keep your bandage (dressing) dry until your health care provider says it can be  removed.  Know the symptoms that should prompt you to contact your health care provider.  Do shoulder exercises as told by your health care provider. Ask your health care provider what activities are safe for you. This information is not intended to replace advice given to you by your health care provider. Make sure you discuss any questions you have with your health care provider. Document Revised: 09/08/2019 Document Reviewed: 09/08/2019 Elsevier Patient Education  2021 [place] Anesthesia, Adult, Care After This sheet gives you information about how to care for yourself after your procedure. Your health care provider may also give you more specific instructions. If you have problems or questions, contact your health care provider. What can I expect after the procedure? After the procedure, the following side effects are common:  Pain or discomfort at the IV site.  Nausea.  Vomiting.  Sore throat.  Trouble concentrating.  Feeling cold or chills.  Feeling weak or tired.  Sleepiness and fatigue.  Soreness and body aches. These side effects can affect parts of the body that were not involved in surgery. Follow these instructions at home: For the time period you were told by your health care provider:  Rest.  Do not participate in activities where you could fall or become injured.  Do not drive or use machinery.  Do not drink alcohol.  Do not take sleeping pills or medicines that cause drowsiness.  Do not make important decisions or sign legal documents.  Do not take care of children on your own.   Eating and drinking  Follow any instructions from your health care provider about eating or drinking restrictions.  When you feel hungry, start by eating small amounts of foods that are soft and easy to digest (bland), such as toast. Gradually return to your regular diet.  Drink enough fluid to keep your urine pale yellow.  If you vomit, rehydrate by  drinking water, juice, or clear broth. General instructions  If you have sleep apnea, surgery and certain medicines can increase your risk for breathing problems. Follow instructions from your health care provider about wearing  your sleep device: ? Anytime you are sleeping, including during daytime naps. ? While taking prescription pain medicines, sleeping medicines, or medicines that make you drowsy.  Have a responsible adult stay with you for the time you are told. It is important to have someone help care for you until you are awake and alert.  Return to your normal activities as told by your health care provider. Ask your health care provider what activities are safe for you.  Take over-the-counter and prescription medicines only as told by your health care provider.  If you smoke, do not smoke without supervision.  Keep all follow-up visits as told by your health care provider. This is important. Contact a health care provider if:  You have nausea or vomiting that does not get better with medicine.  You cannot eat or drink without vomiting.  You have pain that does not get better with medicine.  You are unable to pass urine.  You develop a skin rash.  You have a fever.  You have redness around your IV site that gets worse. Get help right away if:  You have difficulty breathing.  You have chest pain.  You have blood in your urine or stool, or you vomit blood. Summary  After the procedure, it is common to have a sore throat or nausea. It is also common to feel tired.  Have a responsible adult stay with you for the time you are told. It is important to have someone help care for you until you are awake and alert.  When you feel hungry, start by eating small amounts of foods that are soft and easy to digest (bland), such as toast. Gradually return to your regular diet.  Drink enough fluid to keep your urine pale yellow.  Return to your normal activities as told by  your health care provider. Ask your health care provider what activities are safe for you. This information is not intended to replace advice given to you by your health care provider. Make sure you discuss any questions you have with your health care provider. Document Revised: 12/09/2019 Document Reviewed: 07/08/2019 Elsevier Patient Education  2021 [employer].

## 2020-07-06 ENCOUNTER — Other Ambulatory Visit: Payer: Self-pay

## 2020-07-07 ENCOUNTER — Encounter (HOSPITAL_COMMUNITY)
Admission: RE | Admit: 2020-07-07 | Discharge: 2020-07-07 | Disposition: A | Discharge status: Home / Self Care | Payer: Medicare Other | Source: Ambulatory Visit | Attending: Orthopedic Surgery | Admitting: Orthopedic Surgery

## 2020-07-07 ENCOUNTER — Other Ambulatory Visit (HOSPITAL_COMMUNITY)
Admission: RE | Admit: 2020-07-07 | Discharge: 2020-07-07 | Disposition: A | Discharge status: Home / Self Care | Payer: Medicare Other | Source: Ambulatory Visit | Attending: Orthopedic Surgery | Admitting: Orthopedic Surgery

## 2020-07-07 ENCOUNTER — Other Ambulatory Visit: Payer: Self-pay

## 2020-07-07 ENCOUNTER — Encounter (HOSPITAL_COMMUNITY): Payer: Self-pay

## 2020-07-07 DIAGNOSIS — Z20822 Contact with and (suspected) exposure to covid-19: Secondary | ICD-10-CM | POA: Diagnosis not present

## 2020-07-07 DIAGNOSIS — Z01818 Encounter for other preprocedural examination: Secondary | ICD-10-CM | POA: Insufficient documentation

## 2020-07-07 LAB — BASIC METABOLIC PANEL
Anion gap: 11 (ref 5–15)
BUN: 21 mg/dL (ref 8–23)
CO2: 23 mmol/L (ref 22–32)
Calcium: 9.2 mg/dL (ref 8.9–10.3)
Chloride: 103 mmol/L (ref 98–111)
Creatinine, Ser: 1.42 mg/dL — ABNORMAL HIGH (ref 0.44–1.00)
GFR, Estimated: 39 mL/min — ABNORMAL LOW (ref >60)
Glucose, Bld: 107 mg/dL — ABNORMAL HIGH (ref 70–99)
Potassium: 3.9 mmol/L (ref 3.5–5.1)
Sodium: 137 mmol/L (ref 135–145)

## 2020-07-07 LAB — CBC
HCT: 29.4 % — ABNORMAL LOW (ref 36.0–46.0)
Hemoglobin: 9.5 g/dL — ABNORMAL LOW (ref 12.0–15.0)
MCH: 28.8 pg (ref 26.0–34.0)
MCHC: 32.3 g/dL (ref 30.0–36.0)
MCV: 89.1 fL (ref 80.0–100.0)
Platelets: 312 10*3/uL (ref 150–400)
RBC: 3.3 MIL/uL — ABNORMAL LOW (ref 3.87–5.11)
RDW: 14.7 % (ref 11.5–15.5)
WBC: 8.3 10*3/uL (ref 4.0–10.5)
nRBC: 0 % (ref 0.0–0.2)

## 2020-07-07 LAB — HEMOGLOBIN A1C
Hgb A1c MFr Bld: 6.5 % — ABNORMAL HIGH (ref 4.8–5.6)
Mean Plasma Glucose: 139.85 mg/dL

## 2020-07-07 LAB — SARS CORONAVIRUS 2 (TAT 6-24 HRS): SARS Coronavirus 2: NEGATIVE

## 2020-07-10 ENCOUNTER — Observation Stay (HOSPITAL_COMMUNITY)
Admission: RE | Admit: 2020-07-10 | Discharge: 2020-07-11 | Disposition: A | Discharge status: Home / Self Care | Payer: Medicare Other | Source: Ambulatory Visit | Attending: Orthopedic Surgery | Admitting: Orthopedic Surgery

## 2020-07-10 ENCOUNTER — Observation Stay (HOSPITAL_COMMUNITY): Payer: Medicare Other

## 2020-07-10 ENCOUNTER — Encounter (HOSPITAL_COMMUNITY): Payer: Self-pay | Admitting: Orthopedic Surgery

## 2020-07-10 ENCOUNTER — Encounter (HOSPITAL_COMMUNITY)
Admission: RE | Disposition: A | Discharge status: Home / Self Care | Payer: Self-pay | Source: Ambulatory Visit | Attending: Orthopedic Surgery

## 2020-07-10 ENCOUNTER — Ambulatory Visit (HOSPITAL_COMMUNITY): Payer: Medicare Other | Admitting: Anesthesiology

## 2020-07-10 ENCOUNTER — Other Ambulatory Visit: Payer: Self-pay

## 2020-07-10 DIAGNOSIS — M65812 Other synovitis and tenosynovitis, left shoulder: Secondary | ICD-10-CM | POA: Insufficient documentation

## 2020-07-10 DIAGNOSIS — Z7984 Long term (current) use of oral hypoglycemic drugs: Secondary | ICD-10-CM | POA: Insufficient documentation

## 2020-07-10 DIAGNOSIS — M12512 Traumatic arthropathy, left shoulder: Secondary | ICD-10-CM | POA: Diagnosis present

## 2020-07-10 DIAGNOSIS — E119 Type 2 diabetes mellitus without complications: Secondary | ICD-10-CM | POA: Insufficient documentation

## 2020-07-10 DIAGNOSIS — Z79899 Other long term (current) drug therapy: Secondary | ICD-10-CM | POA: Insufficient documentation

## 2020-07-10 DIAGNOSIS — I10 Essential (primary) hypertension: Secondary | ICD-10-CM | POA: Insufficient documentation

## 2020-07-10 DIAGNOSIS — M12812 Other specific arthropathies, not elsewhere classified, left shoulder: Secondary | ICD-10-CM | POA: Diagnosis not present

## 2020-07-10 DIAGNOSIS — Z7982 Long term (current) use of aspirin: Secondary | ICD-10-CM | POA: Insufficient documentation

## 2020-07-10 HISTORY — PX: REVERSE SHOULDER ARTHROPLASTY: SHX5054

## 2020-07-10 LAB — TYPE AND SCREEN
ABO/RH(D): O POS
Antibody Screen: NEGATIVE

## 2020-07-10 LAB — GLUCOSE, CAPILLARY
Glucose-Capillary: 134 mg/dL — ABNORMAL HIGH (ref 70–99)
Glucose-Capillary: 150 mg/dL — ABNORMAL HIGH (ref 70–99)
Glucose-Capillary: 125 mg/dL — ABNORMAL HIGH (ref 70–99)
Glucose-Capillary: 170 mg/dL — ABNORMAL HIGH (ref 70–99)

## 2020-07-10 LAB — CBC
HCT: 24.3 % — ABNORMAL LOW (ref 36.0–46.0)
Hemoglobin: 7.9 g/dL — ABNORMAL LOW (ref 12.0–15.0)
MCH: 28.5 pg (ref 26.0–34.0)
MCHC: 32.5 g/dL (ref 30.0–36.0)
MCV: 87.7 fL (ref 80.0–100.0)
Platelets: 243 10*3/uL (ref 150–400)
RBC: 2.77 MIL/uL — ABNORMAL LOW (ref 3.87–5.11)
RDW: 14.5 % (ref 11.5–15.5)
WBC: 11.3 10*3/uL — ABNORMAL HIGH (ref 4.0–10.5)
nRBC: 0 % (ref 0.0–0.2)

## 2020-07-10 SURGERY — ARTHROPLASTY, SHOULDER, TOTAL, REVERSE
Anesthesia: General | Site: Shoulder | Laterality: Left

## 2020-07-10 MED ORDER — ROPIVACAINE HCL 5 MG/ML IJ SOLN
INTRAMUSCULAR | Status: AC
  Filled 2020-07-10: qty 30

## 2020-07-10 MED ORDER — METFORMIN HCL 500 MG PO TABS
1000.0000 mg | ORAL_TABLET | Freq: Two times a day (BID) | ORAL | Status: DC
  Administered 2020-07-10 – 2020-07-11 (×2): 1000 mg via ORAL
  Filled 2020-07-10 (×2): qty 2

## 2020-07-10 MED ORDER — HYDROCHLOROTHIAZIDE 25 MG PO TABS
25.0000 mg | ORAL_TABLET | Freq: Every day | ORAL | Status: DC
  Administered 2020-07-10 – 2020-07-11 (×2): 25 mg via ORAL
  Filled 2020-07-10 (×2): qty 1

## 2020-07-10 MED ORDER — CEFAZOLIN SODIUM-DEXTROSE 2-4 GM/100ML-% IV SOLN
INTRAVENOUS | Status: AC
  Administered 2020-07-10: 2 g via INTRAVENOUS
  Filled 2020-07-10: qty 100

## 2020-07-10 MED ORDER — ROPIVACAINE HCL 5 MG/ML IJ SOLN
INTRAMUSCULAR | Status: DC | PRN
  Administered 2020-07-10: 20 mL via PERINEURAL

## 2020-07-10 MED ORDER — PHENYLEPHRINE HCL (PRESSORS) 10 MG/ML IV SOLN
INTRAVENOUS | Status: DC | PRN
  Administered 2020-07-10 (×7): 40 ug via INTRAVENOUS

## 2020-07-10 MED ORDER — ROCURONIUM BROMIDE 10 MG/ML (PF) SYRINGE
PREFILLED_SYRINGE | INTRAVENOUS | Status: AC
  Filled 2020-07-10: qty 10

## 2020-07-10 MED ORDER — ORAL CARE MOUTH RINSE: 15.0000 mL | Freq: Once | OROMUCOSAL | Status: AC

## 2020-07-10 MED ORDER — SODIUM CHLORIDE 0.9 % IR SOLN
Status: DC | PRN
  Administered 2020-07-10: 3000 mL

## 2020-07-10 MED ORDER — EPHEDRINE SULFATE 50 MG/ML IJ SOLN
INTRAMUSCULAR | Status: DC | PRN
  Administered 2020-07-10 (×2): 10 mg via INTRAVENOUS

## 2020-07-10 MED ORDER — BUPIVACAINE-EPINEPHRINE (PF) 0.5% -1:200000 IJ SOLN
INTRAMUSCULAR | Status: DC | PRN
  Administered 2020-07-10: 30 mL via PERINEURAL

## 2020-07-10 MED ORDER — CHLORHEXIDINE GLUCONATE 0.12 % MT SOLN: 15.0000 mL | Freq: Once | OROMUCOSAL | Status: AC

## 2020-07-10 MED ORDER — PHENYLEPHRINE 40 MCG/ML (10ML) SYRINGE FOR IV PUSH (FOR BLOOD PRESSURE SUPPORT)
PREFILLED_SYRINGE | INTRAVENOUS | Status: AC
  Filled 2020-07-10: qty 10

## 2020-07-10 MED ORDER — MORPHINE SULFATE (PF) 2 MG/ML IV SOLN
0.5000 mg | INTRAVENOUS | Status: DC | PRN
  Administered 2020-07-10: 1 mg via INTRAVENOUS
  Filled 2020-07-10 (×2): qty 1

## 2020-07-10 MED ORDER — SUGAMMADEX SODIUM 200 MG/2ML IV SOLN
INTRAVENOUS | Status: DC | PRN
  Administered 2020-07-10: 100 mg via INTRAVENOUS

## 2020-07-10 MED ORDER — TRANEXAMIC ACID-NACL 1000-0.7 MG/100ML-% IV SOLN
INTRAVENOUS | Status: DC | PRN
  Administered 2020-07-10: 1000 mg via INTRAVENOUS

## 2020-07-10 MED ORDER — GABAPENTIN 100 MG PO CAPS
100.0000 mg | ORAL_CAPSULE | Freq: Two times a day (BID) | ORAL | Status: DC
  Administered 2020-07-10 – 2020-07-11 (×2): 100 mg via ORAL
  Filled 2020-07-10 (×2): qty 1

## 2020-07-10 MED ORDER — VANCOMYCIN HCL 1000 MG IV SOLR
INTRAVENOUS | Status: DC | PRN
  Administered 2020-07-10: 1000 mg via TOPICAL

## 2020-07-10 MED ORDER — ROCURONIUM BROMIDE 100 MG/10ML IV SOLN
INTRAVENOUS | Status: DC | PRN
  Administered 2020-07-10: 50 mg via INTRAVENOUS

## 2020-07-10 MED ORDER — MIDAZOLAM HCL 2 MG/2ML IJ SOLN
INTRAMUSCULAR | Status: AC
  Administered 2020-07-10: 2 mg
  Filled 2020-07-10: qty 2

## 2020-07-10 MED ORDER — ONDANSETRON HCL 4 MG/2ML IJ SOLN: 4.0000 mg | Freq: Once | INTRAMUSCULAR | Status: DC | PRN

## 2020-07-10 MED ORDER — DILTIAZEM HCL ER COATED BEADS 180 MG PO CP24
360.0000 mg | ORAL_CAPSULE | Freq: Every day | ORAL | Status: DC
  Administered 2020-07-11: 360 mg via ORAL
  Filled 2020-07-10: qty 2

## 2020-07-10 MED ORDER — LOSARTAN POTASSIUM 50 MG PO TABS
50.0000 mg | ORAL_TABLET | Freq: Every day | ORAL | Status: DC
  Administered 2020-07-10 – 2020-07-11 (×2): 50 mg via ORAL
  Filled 2020-07-10 (×2): qty 1

## 2020-07-10 MED ORDER — PROPOFOL 10 MG/ML IV BOLUS
INTRAVENOUS | Status: AC
  Filled 2020-07-10: qty 20

## 2020-07-10 MED ORDER — CELECOXIB 100 MG PO CAPS
100.0000 mg | ORAL_CAPSULE | Freq: Two times a day (BID) | ORAL | Status: DC
  Administered 2020-07-10 – 2020-07-11 (×2): 100 mg via ORAL
  Filled 2020-07-10 (×2): qty 1

## 2020-07-10 MED ORDER — ACETAMINOPHEN 500 MG PO TABS
1000.0000 mg | ORAL_TABLET | Freq: Three times a day (TID) | ORAL | Status: DC
  Administered 2020-07-10 – 2020-07-11 (×3): 1000 mg via ORAL
  Filled 2020-07-10 (×3): qty 2

## 2020-07-10 MED ORDER — DIPHENHYDRAMINE HCL 12.5 MG/5ML PO ELIX: 12.5000 mg | ORAL_SOLUTION | ORAL | Status: DC | PRN

## 2020-07-10 MED ORDER — MIDAZOLAM HCL 2 MG/2ML IJ SOLN
INTRAMUSCULAR | Status: AC
  Filled 2020-07-10: qty 2

## 2020-07-10 MED ORDER — LIDOCAINE HCL (PF) 2 % IJ SOLN
INTRAMUSCULAR | Status: AC
  Filled 2020-07-10: qty 5

## 2020-07-10 MED ORDER — ONDANSETRON HCL 4 MG/2ML IJ SOLN: INTRAMUSCULAR | Status: DC | PRN

## 2020-07-10 MED ORDER — PROPOFOL 10 MG/ML IV BOLUS
INTRAVENOUS | Status: DC | PRN
  Administered 2020-07-10: 120 mg via INTRAVENOUS

## 2020-07-10 MED ORDER — BUPIVACAINE-EPINEPHRINE (PF) 0.5% -1:200000 IJ SOLN
INTRAMUSCULAR | Status: AC
  Filled 2020-07-10: qty 30

## 2020-07-10 MED ORDER — ONDANSETRON HCL 4 MG/2ML IJ SOLN: 4.0000 mg | Freq: Four times a day (QID) | INTRAMUSCULAR | Status: DC | PRN

## 2020-07-10 MED ORDER — 0.9 % SODIUM CHLORIDE (POUR BTL) OPTIME
TOPICAL | Status: DC | PRN
  Administered 2020-07-10: 1000 mL

## 2020-07-10 MED ORDER — FENTANYL CITRATE (PF) 100 MCG/2ML IJ SOLN
INTRAMUSCULAR | Status: AC
  Filled 2020-07-10: qty 2

## 2020-07-10 MED ORDER — DOCUSATE SODIUM 100 MG PO CAPS
100.0000 mg | ORAL_CAPSULE | Freq: Two times a day (BID) | ORAL | Status: DC
  Administered 2020-07-10 – 2020-07-11 (×2): 100 mg via ORAL
  Filled 2020-07-10 (×2): qty 1

## 2020-07-10 MED ORDER — CEFAZOLIN SODIUM-DEXTROSE 2-4 GM/100ML-% IV SOLN
2.0000 g | Freq: Three times a day (TID) | INTRAVENOUS | Status: AC
  Administered 2020-07-11: 2 g via INTRAVENOUS
  Filled 2020-07-10 (×2): qty 100

## 2020-07-10 MED ORDER — ASPIRIN 81 MG PO CHEW
81.0000 mg | CHEWABLE_TABLET | Freq: Once | ORAL | Status: AC
  Administered 2020-07-11: 81 mg via ORAL
  Filled 2020-07-10: qty 1

## 2020-07-10 MED ORDER — ONDANSETRON HCL 4 MG/2ML IJ SOLN
INTRAMUSCULAR | Status: AC
  Filled 2020-07-10: qty 2

## 2020-07-10 MED ORDER — FENTANYL CITRATE (PF) 100 MCG/2ML IJ SOLN
INTRAMUSCULAR | Status: DC | PRN
  Administered 2020-07-10: 100 ug via INTRAVENOUS

## 2020-07-10 MED ORDER — TRANEXAMIC ACID-NACL 1000-0.7 MG/100ML-% IV SOLN
INTRAVENOUS | Status: AC
  Filled 2020-07-10: qty 100

## 2020-07-10 MED ORDER — ONDANSETRON HCL 4 MG/2ML IJ SOLN
INTRAMUSCULAR | Status: DC | PRN
  Administered 2020-07-10: 4 mg via INTRAVENOUS

## 2020-07-10 MED ORDER — FENTANYL CITRATE (PF) 100 MCG/2ML IJ SOLN: 25.0000 ug | INTRAMUSCULAR | Status: DC | PRN

## 2020-07-10 MED ORDER — OXYCODONE HCL 5 MG PO TABS: 10.0000 mg | ORAL_TABLET | ORAL | Status: DC | PRN

## 2020-07-10 MED ORDER — DEXAMETHASONE SODIUM PHOSPHATE 10 MG/ML IJ SOLN
INTRAMUSCULAR | Status: AC
  Filled 2020-07-10: qty 1

## 2020-07-10 MED ORDER — FENTANYL CITRATE (PF) 250 MCG/5ML IJ SOLN
INTRAMUSCULAR | Status: AC
  Filled 2020-07-10: qty 5

## 2020-07-10 MED ORDER — CEFAZOLIN SODIUM-DEXTROSE 2-4 GM/100ML-% IV SOLN
2.0000 g | INTRAVENOUS | Status: AC
  Administered 2020-07-10: 2 g via INTRAVENOUS

## 2020-07-10 MED ORDER — EPHEDRINE 5 MG/ML INJ
INTRAVENOUS | Status: AC
  Filled 2020-07-10: qty 10

## 2020-07-10 MED ORDER — CHLORHEXIDINE GLUCONATE 0.12 % MT SOLN
OROMUCOSAL | Status: AC
  Administered 2020-07-10: 15 mL via OROMUCOSAL
  Filled 2020-07-10: qty 15

## 2020-07-10 MED ORDER — ATORVASTATIN CALCIUM 20 MG PO TABS
20.0000 mg | ORAL_TABLET | Freq: Every day | ORAL | Status: DC
  Administered 2020-07-10 – 2020-07-11 (×2): 20 mg via ORAL
  Filled 2020-07-10 (×2): qty 1

## 2020-07-10 MED ORDER — ONDANSETRON HCL 4 MG PO TABS: 4.0000 mg | ORAL_TABLET | Freq: Four times a day (QID) | ORAL | Status: DC | PRN

## 2020-07-10 MED ORDER — LACTATED RINGERS IV SOLN: INTRAVENOUS | Status: DC

## 2020-07-10 MED ORDER — OXYCODONE HCL 5 MG PO TABS
5.0000 mg | ORAL_TABLET | ORAL | Status: DC | PRN
  Administered 2020-07-10 – 2020-07-11 (×2): 5 mg via ORAL
  Filled 2020-07-10 (×2): qty 1

## 2020-07-10 SURGICAL SUPPLY — 86 items
APL PRP STRL LF DISP 70% ISPRP (MISCELLANEOUS) ×2
BIT DRILL FLUTED 3.0 STRL (BIT) ×2 IMPLANT
BLADE SAW SGTL 83.5X18.5 (BLADE) ×2 IMPLANT
BLADE SURG SZ10 CARB STEEL (BLADE) ×2 IMPLANT
BNDG COHESIVE 4X5 TAN STRL (GAUZE/BANDAGES/DRESSINGS) ×2 IMPLANT
BNDG GAUZE ELAST 4 BULKY (GAUZE/BANDAGES/DRESSINGS) ×2 IMPLANT
CHLORAPREP W/TINT 26 (MISCELLANEOUS) ×4 IMPLANT
CLOTH BEACON ORANGE TIMEOUT ST (SAFETY) ×2 IMPLANT
CLSR STERI-STRIP ANTIMIC 1/2X4 (GAUZE/BANDAGES/DRESSINGS) ×2 IMPLANT
COOLER ICEMAN CLASSIC (MISCELLANEOUS) ×2 IMPLANT
COVER LIGHT HANDLE STERIS (MISCELLANEOUS) ×4 IMPLANT
COVER WAND RF STERILE (DRAPES) ×2 IMPLANT
CUFF CRYO UNI SHDR 32X48 (MISCELLANEOUS) ×2 IMPLANT
CUP SUT UNIV REVERS 36 NEUTRAL (Cup) ×2 IMPLANT
DECANTER SPIKE VIAL GLASS SM (MISCELLANEOUS) ×2 IMPLANT
DRAPE HALF SHEET 40X57 (DRAPES) ×2 IMPLANT
DRAPE INCISE IOBAN 44X35 STRL (DRAPES) ×2 IMPLANT
DRAPE ORTHO SPLIT 77X108 STRL (DRAPES) ×4
DRAPE SHOULDER BEACH CHAIR (DRAPES) ×2 IMPLANT
DRAPE SURG ORHT 6 SPLT 77X108 (DRAPES) ×2 IMPLANT
DRAPE U-SHAPE 47X51 STRL (DRAPES) ×2 IMPLANT
DRESSING AQUACEL AG ADV 3.5X12 (MISCELLANEOUS) ×1 IMPLANT
DRESSING AQUACEL AG SP 3.5X10 (GAUZE/BANDAGES/DRESSINGS) ×1 IMPLANT
DRSG AQUACEL AG ADV 3.5X12 (MISCELLANEOUS) ×2
DRSG AQUACEL AG SP 3.5X10 (GAUZE/BANDAGES/DRESSINGS) ×2
ELECT REM PT RETURN 9FT ADLT (ELECTROSURGICAL) ×2
ELECTRODE REM PT RTRN 9FT ADLT (ELECTROSURGICAL) ×1 IMPLANT
GLENOID UNI REV MOD 24 +2 LAT (Joint) ×2 IMPLANT
GLENOSPHERE 33+4 LAT/24 UNI RV (Joint) ×2 IMPLANT
GLOVE SKINSENSE NS SZ8.0 LF (GLOVE) ×3
GLOVE SKINSENSE STRL SZ8.0 LF (GLOVE) ×3 IMPLANT
GLOVE SRG 8 PF TXTR STRL LF DI (GLOVE) ×1 IMPLANT
GLOVE SURG UNDER POLY LF SZ7 (GLOVE) ×6 IMPLANT
GLOVE SURG UNDER POLY LF SZ8 (GLOVE) ×2
GOWN STRL REUS W/ TWL XL LVL3 (GOWN DISPOSABLE) ×1 IMPLANT
GOWN STRL REUS W/TWL LRG LVL3 (GOWN DISPOSABLE) ×4 IMPLANT
GOWN STRL REUS W/TWL XL LVL3 (GOWN DISPOSABLE) ×2
HANDPIECE INTERPULSE COAX TIP (DISPOSABLE) ×2
HEMOSTAT SURGICEL 4X8 (HEMOSTASIS) IMPLANT
HOOD W/PEELAWAY (MISCELLANEOUS) ×6 IMPLANT
INSERT HUMERAL 36 +3/33 COMBO (Joint) ×2 IMPLANT
INST SET MINOR BONE (KITS) ×2 IMPLANT
IV NS IRRIG 3000ML ARTHROMATIC (IV SOLUTION) ×2 IMPLANT
KIT POSITION SHOULDER SCHLEI (MISCELLANEOUS) ×2 IMPLANT
KIT TURNOVER KIT A (KITS) ×2 IMPLANT
MANIFOLD NEPTUNE II (INSTRUMENTS) ×2 IMPLANT
MARKER SKIN DUAL TIP RULER LAB (MISCELLANEOUS) ×2 IMPLANT
NEEDLE HYPO 18GX1.5 BLUNT FILL (NEEDLE) ×2 IMPLANT
NEEDLE HYPO 21X1.5 SAFETY (NEEDLE) ×2 IMPLANT
NS IRRIG 1000ML POUR BTL (IV SOLUTION) ×2 IMPLANT
PACK BASIC III (CUSTOM PROCEDURE TRAY) ×2
PACK SRG BSC III STRL LF ECLPS (CUSTOM PROCEDURE TRAY) ×1 IMPLANT
PACK TOTAL JOINT (CUSTOM PROCEDURE TRAY) ×2 IMPLANT
PAD ABD 5X9 TENDERSORB (GAUZE/BANDAGES/DRESSINGS) ×8 IMPLANT
PAD ABD 8X10 STRL (GAUZE/BANDAGES/DRESSINGS) ×2 IMPLANT
PASSER SUT CAPTURE FIRST (INSTRUMENTS) ×2 IMPLANT
PENCIL SMOKE EVACUATOR (MISCELLANEOUS) ×2 IMPLANT
PIN SET MODULAR GLENOID SYSTEM (PIN) ×2 IMPLANT
SCREW CENTRAL MODULAR 25 (Screw) ×2 IMPLANT
SCREW PERI LOCK 5.5X16 (Screw) ×4 IMPLANT
SCREW PERIPHERAL 5.5X28 LOCK (Screw) ×4 IMPLANT
SET BASIN LINEN APH (SET/KITS/TRAYS/PACK) ×2 IMPLANT
SET HNDPC FAN SPRY TIP SCT (DISPOSABLE) ×1 IMPLANT
SLING ULTRA III MED (ORTHOPEDIC SUPPLIES) ×2 IMPLANT
SPONGE LAP 18X18 RF (DISPOSABLE) ×2 IMPLANT
STAPLER VISISTAT (STAPLE) IMPLANT
STEM HUMERAL MOD SZ 5 135 DEG (Stem) ×2 IMPLANT
STRIP CLOSURE SKIN 1/2X4 (GAUZE/BANDAGES/DRESSINGS) ×4 IMPLANT
SUT BRALON NAB BRD #1 30IN (SUTURE) IMPLANT
SUT ETHIBOND NAB OS 4 #2 30IN (SUTURE) ×4 IMPLANT
SUT ETHILON 3 0 FSL (SUTURE) IMPLANT
SUT MNCRL AB 4-0 PS2 18 (SUTURE) ×6 IMPLANT
SUT MON AB 2-0 CT1 36 (SUTURE) ×4 IMPLANT
SUT NUROLON CT 2 BLK #1 18IN (SUTURE) IMPLANT
SUT VIC AB 0 CT1 27 (SUTURE) ×4
SUT VIC AB 0 CT1 27XBRD ANTBC (SUTURE) ×2 IMPLANT
SUT VIC AB 1 CT1 27 (SUTURE) ×6
SUT VIC AB 1 CT1 27XBRD ANTBC (SUTURE) ×3 IMPLANT
SUTURE TAPE 1.3 40 TPR END (SUTURE) ×6 IMPLANT
SUTURETAPE 1.3 40 TPR END (SUTURE) ×12
SYR 10ML LL (SYRINGE) ×2 IMPLANT
SYR 30ML LL (SYRINGE) ×2 IMPLANT
SYR BULB IRRIG 60ML STRL (SYRINGE) ×2 IMPLANT
SYR TOOMEY 50ML (SYRINGE) ×2 IMPLANT
WATER STERILE IRR 1000ML POUR (IV SOLUTION) ×2 IMPLANT
YANKAUER SUCT 12FT TUBE ARGYLE (SUCTIONS) ×2 IMPLANT

## 2020-07-10 NOTE — Interval H&P Note (Signed)
History and Physical Interval Note:  07/10/2020 7:11 AM  Stacy Webb  has presented today for surgery, with the diagnosis of Left shoulder Reverse Shoulder Arthroplasty..  The various methods of treatment have been discussed with the patient and family. After consideration of risks, benefits and other options for treatment, the patient has consented to  Procedure(s): REVERSE SHOULDER ARTHROPLASTY (Left) as a surgical intervention.  The patient's history has been reviewed, patient examined, no change in status, stable for surgery.  I have reviewed the patient's chart and labs.  Questions were answered to the patient's satisfaction.    Procedure discussed with the patient and all questions were answered to her satisfaction.  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  She will benefit from reverse shoulder arthroplasty.  Consent has been signed.  She will be admitted for over night observation following the procedure.    [name]

## 2020-07-10 NOTE — Discharge Instructions (Signed)
Stacy Webb. Amedeo Kinsman, MD [facility] [address] [place],  [place]  [address] Phone: [phone] Fax: [phone]    [place] ? You may leave the operative dressing in place until your follow-up appointment. ? KEEP THE INCISIONS CLEAN AND DRY. ? There may be Webb small amount of fluid/bleeding leaking at the surgical site. This is normal after surgery.  ? If it fills with liquid or blood please call us immediately to change it for you. ? Use the provided ice machine or Ice packs as often as possible for the first 3-4 days, then as needed for pain relief.  Keep Webb layer of cloth or Webb shirt between your skin and the cooling unit to prevent frost bite as it can get very cold.  SHOWERING: - You may shower on Post-Op Day #2.  - The dressing is water resistant but do not scrub it as it may start to peel up.   - You may remove the sling for showering, but keep Webb water resistant pillow under the arm to keep both the  elbow and shoulder away from the body (mimicking the abduction sling).  - Gently pat the area dry.  - Do not soak the shoulder in water. Do not go swimming in the pool or ocean until your sutures are removed. - KEEP THE INCISIONS CLEAN AND DRY.  EXERCISES ? Wear the sling at all times except when doing your exercises. You may remove the sling for showering, but keep the arm across the chest or in Webb secondary sling.    ? Accidental/Purposeful External Rotation and shoulder flexion (reaching behind you) is to be avoided at all costs for the first month. ? It is ok to come out of your sling if your are sitting and have assistance for eating.  Do not lift anything heavier than 1 pound until we discuss it further in clinic. ? Please perform the exercises:   . Elbow / Hand / Wrist  Range of Motion Exercises . Grip strengthening   REGIONAL ANESTHESIA (NERVE BLOCKS) . The anesthesia team may have  performed Webb nerve block for you if safe in the setting of your care.  This is Webb great tool used to minimize pain.  Typically the block may start wearing off overnight but the long acting medicine may last for 3-4 days.  The nerve block wearing off can be Webb challenging period but please utilize your as needed pain medications to try and manage this period.    POST-OP MEDICATIONS- Multimodal approach to pain control . In general your pain will be controlled with Webb combination of substances.  Prescriptions unless otherwise discussed are electronically sent to your pharmacy.  This is Webb carefully made plan we use to minimize narcotic use.     ? Celebrex - Anti-inflammatory medication taken on Webb scheduled basis ? Acetaminophen - Non-narcotic pain medicine taken on Webb scheduled basis  ? Oxycodone - This is Webb strong narcotic, to be used only on an "as needed" basis for pain. ? Aspirin 81mg  - This medicine is used to minimize the risk of blood clots after surgery. ? Zofran -  take as needed for nausea  Meloxicam/Celebrex - these are anti-inflammatory and pain relievers.  Do not take additional ibuprofen, naproxen or other NSAID while taking this medicine.   FOLLOW-UP ? If you develop Webb Fever (>101.5), Redness or Drainage from the surgical incision site, please  call our office to arrange for an evaluation. ? Please call the office to schedule Webb follow-up appointment for Webb wound check, 7-10 days post-operatively.  IF YOU HAVE ANY QUESTIONS, PLEASE FEEL FREE TO CALL OUR OFFICE.  HELPFUL INFORMATION  . If you had Webb block, it will wear off between 8-24 hrs postop typically.  This is period when your pain may go from nearly zero to the pain you would have had post-op without the block.  This is an abrupt transition but nothing dangerous is happening.  You may take an extra dose of narcotic when this happens.  ? Your arm will be in Webb sling following surgery. You will be in this sling for the next 3-4 weeks.  I  will let you know the exact duration at your follow-up visit.  ? You may be more comfortable sleeping in Webb semi-seated position the first few nights following surgery.  Keep Webb pillow propped under the elbow and forearm for comfort.  If you have Webb recliner type of chair it might be beneficial.  If not that is fine too, but it would be helpful to sleep propped up with pillows behind your operated shoulder as well under your elbow and forearm.  This will reduce pulling on the suture lines.  ? When dressing, put your operative arm in the sleeve first.  When getting undressed, take your operative arm out last.  Loose fitting, button-down shirts are recommended.  ? In most states it is against the law to drive while your arm is in Webb sling. And certainly against the law to drive while taking narcotics.  ? You may return to work/school in the next couple of days when you feel up to it. Desk work and typing in the sling is fine.  ? We suggest you use the pain medication the first night prior to going to bed, in order to ease any pain when the anesthesia wears off. You should avoid taking pain medications on an empty stomach as it will make you nauseous.  ? Do not drink alcoholic beverages or take illicit drugs when taking pain medications.  ? Pain medication may make you constipated.  Below are Webb few solutions to try in this order: - Decrease the amount of pain medication if you aren't having pain. - Drink lots of decaffeinated fluids. - Drink prune juice and/or each dried prunes  o If the first 3 don't work start with additional solutions - Take Colace - an over-the-counter stool softener - Take Senokot - an over-the-counter laxative - Take Miralax - Webb stronger over-the-counter laxative   Dental Antibiotics:  In most cases prophylactic antibiotics for Dental procdeures after total joint surgery are not necessary.  Exceptions are as follows:  1. History of prior total joint infection  2.  Severely immunocompromised (Organ Transplant, cancer chemotherapy, Rheumatoid biologic meds such as Optima)  3. Poorly controlled diabetes (A1C &gt; 8.0, blood glucose over 200)  If you have one of these conditions, contact your surgeon for an antibiotic prescription, prior to your dental procedure.

## 2020-07-10 NOTE — Anesthesia Procedure Notes (Signed)
Procedure Name: Intubation Date/Time: 07/10/2020 8:06 AM Performed by: Alvy Bimler, CRNA Pre-anesthesia Checklist: Patient identified, Emergency Drugs available, Suction available and Patient being monitored Patient Re-evaluated:Patient Re-evaluated prior to induction Oxygen Delivery Method: Circle system utilized Preoxygenation: Pre-oxygenation with 100% oxygen Induction Type: IV induction Ventilation: Mask ventilation without difficulty Laryngoscope Size: Mac and 3 Grade View: Grade II Tube type: Oral Tube size: 7.0 mm Number of attempts: 1 Airway Equipment and Method: Stylet Placement Confirmation: ETT inserted through vocal cords under direct vision,  positive ETCO2 and breath sounds checked- equal and bilateral Secured at: 21 (right lip) cm Tube secured with: Tape Dental Injury: Teeth and Oropharynx as per pre-operative assessment

## 2020-07-10 NOTE — Op Note (Addendum)
Orthopaedic Surgery Operative Note (CSN: [id number])  Stacy Webb  [birth year] Date of Surgery: 07/10/2020   Diagnoses:  Left shoulder Rotator Cuff Arthropathy.  Procedure: Left reverse shoulder arthroplasty   Operative Finding Successful completion of the planned procedure.  No complications.  Subscapularis was repaired   Post-Op Diagnosis: Same Surgeons:Primary: [name], MD Assistants:  Marquita Palms Location: [facility] OR ROOM 4 Anesthesia: General with interscalene block Antibiotics: Ancef 2 g with local vancomycin powder 1 g at the surgical site Tourniquet time: None Estimated Blood Loss: 242 cc Complications: None Specimens: None Implants: Implant Name Type Inv. Item Serial No. Manufacturer Lot No. LRB No. Used Action  SCREW CENTRAL MODULAR 25 - [id number] Screw SCREW CENTRAL MODULAR 25  [employer] [id number] Left 1 Implanted  GLENOID UNI REV MOD 24 +2 LAT - [id number] Joint GLENOID UNI REV MOD 24 +2 LAT  [employer] [id number] Left 1 Implanted  glenosphere 33 +4 lat/24    [employer] [id number] Left 1 Implanted  SCREW PERIPHERAL 5.5X28 LOCK - [id number] Screw SCREW PERIPHERAL 5.5X28 LOCK  [employer] [id number] Left 1 Implanted  SCREW PERIPHERAL 5.5X28 LOCK - [id number] Screw SCREW PERIPHERAL 5.5X28 [place] [id number] Left 1 Implanted  SCREW PERI LOCK 5.5X16 - [id number] Screw SCREW PERI LOCK 5.5X16  [employer] [id number] Left 1 Implanted  SCREW PERI LOCK 5.5X16 - [id number] Screw SCREW PERI LOCK 5.5X16  [employer] [id number] Left 1 Implanted  suture cup 36 neutral    [employer] [id number] Left 1 Implanted  STEM HUMERAL MOD SZ 5 135 DEG - [id number] Stem STEM HUMERAL MOD SZ 5 135 DEG  [employer] [id number] Left 1 Implanted  combo humeral insert    [employer] [id number] Left 1 Implanted    Indications for Surgery:   Stacy Webb is a 75 y.o. 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  She has previously tried multiple  steroid injections, home exercise program and PO pain medications without improvement in her symptoms.  Benefits and risks of operative and nonoperative management were discussed prior to surgery with patient/guardian(s) and informed consent form was completed.  Specific risks including, but not limited to, infection, need for additional surgery, bleeding, persistent pain, non-union, implant loosening, malunion, persistent pain, stiffness, dislocation and more severe complications associated with anesthesia were discussed with the patient.  The patient has elected to proceed.  The patient is at an increased risk for infection and poor healing due to associated medical comorbidities including diabetes (well controlled, A1c = 6.2).  She was cleared medically by her primary care provider and anesthesia prior to surgery.    Procedure:   The patient was identified properly. Informed consent was obtained and the surgical site was marked. The patient was taken to the operating room where general anesthesia was induced.  The patient was positioned in beach chair position.  The left shoulder was prepped and draped in the usual sterile fashion.  Timeout was performed before the beginning of the case.  The patient received appropriate antibiotics prior to making incision.  In addition, the patient received 1 g TXA prior to starting.  This was confirmed during the preincision timeout.  We made an incision for the standard deltopectoral approach with a #10 blade. We dissected down through the subcutaneous tissues and the cephalic vein was taken laterally with the deltoid. The clavipectoral fascia was incised in line with the incision. Deep retractors were placed. The long of the biceps tendon  was identified and there was significant tenosynovitis present.  A biceps tenodesis was performed to the pectoralis tendon with #2 Fiberwire. The remaining biceps was followed up into the rotator interval where it was released.    The subscapularis was taken down in a full thickness layer with capsule along the humeral neck extending inferiorly around the humeral head. We continued releasing the capsule directly off of the osteophytes inferiorly all the way around the corner. This allowed [place] to dislocate the humeral head. Multiple tag stitches were placed in the subscapularis tendon to maintain control during the release of the tendon, and for the remainder of the case.   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   The rotator cuff was carefully examined and noted to be irreperably torn.  The decision was confirmed that a reverse total shoulder was indicated for this patient.  There were osteophytes along the inferior humeral neck. The osteophytes were removed with an osteotome and a rongeur and the anatomic neck was well visualized.     A humeral cutting guide with a version rod was secured to the anterior aspect of the humeral head. The version was set at 20 of retroversion. A humeral osteotomy was performed with an oscillating saw. The head fragment was passed off to the back table, and used to estimate the humeral head size for our implant.  A cut protector was placed.  The humerus was then reduced.  We placed an anterior glenoid retractor to expose the glenoid.  The subscapularis was again identified and we took care to palpate the axillary nerve anteriorly and verify its position with gentle palpation as well as the tug test.  We then released the SGHL with bovie cautery prior to placing a curved mayo at the junction of the anterior glenoid well above the axillary nerve and bluntly dissecting the subscapularis from the capsule.  We then carefully protected the axillary nerve as we gently released the inferior capsule to fully mobilize the subscapularis.  An anterior deltoid retractor was then placed as well as a small Hohmann retractor  superiorly.   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 The remaining labrum was removed circumferentially taking great care not to disrupt the posterior capsule.   The glenoid drill guide was placed and used to drill a guide pin in the center, inferior position. The glenoid face was then reamed concentrically over the guide wire. The center hole was drilled over the guidepin in a near anatomic angle of version. Next the glenoid vault was drilled back to a depth of 25 mm.  We tapped and then placed a 30mm size baseplate with 81mm lateralization.  The base plate was screwed into the glenoid vault obtaining secure fixation. We next placed superior and inferior locking screws for additional fixation, followed by smaller, 16 mm, anterior and posterior screws.  Next a 33 mm glenosphere was selected and impacted onto the baseplate. The center screw was tightened.  We turned our attention back to the humeral side and the cut protector was removed. A starter awl was used to open the humeral canal. We next used T-handle straight reamers to ream up to an appropriate fit. We then broached starting with a size 5 broach and broaching up to 5.5 which obtained an appropriate fit.  We then prepared to place the real humeral implant.  2 drill holes  were placed within the bicipital groove.  We passed two fiberwire sutures through the holes and around the humeral implant.  The implant was then secured placed within the humerus, achieving a good fit.   With the humeral implant in place, we prepared to trial a 3 mm poly, however it was apparent that this was going to achieve a good fit, so we did not trial additional components to ensure that we did not have difficulty with follow up manipulation of the implants.  Therefore we proceeded to place a 36+3 mm poly which provided good stability and range of motion without  excess soft tissue tension.   The offset was dialed in to match the normal anatomy.  There was good ROM in all planes and the shoulder was stable with no inferior translation.  Easily able to get the patient to more than 90 degrees abduction, 120 forward flexion and 45 degrees of ER at her side.    The joint was reduced and thoroughly irrigated with pulsatile lavage. The subscapularis tendon was repaired back with #2 Fiberwire sutures through the eyelets on the implant, and the sutures that were passed around the implant. Hemostasis was obtained.  We did have to tie off the cephalic vein which was damaged during the procedure.  1 g of vancomycin powder was placed in the incision. The deltopectoral interval was reapproximated with #1 Vicryl. The subcutaneous tissues were closed with 2-0 monocryl and the skin was closed with running monocryl.    The wounds were cleaned and dried and an Aquacel dressing was placed. The drapes taken down. The arm was placed into sling with abduction pillow. Patient was awakened, extubated, and transferred to the recovery room in stable condition. There were no intraoperative complications. The sponge, needle, and attention counts were correct at the end of the case.   Post-operative plan:  The patient will be admitted for over night observation.   We have placed a referral for PT to begin 1-2 weeks postop, prior to the first postoperative clinic visit.  DVT prophylaxis Aspirin 81 mg twice daily for 6 weeks.    Pain control with PRN pain medication preferring oral medicines.   Follow up plan will be scheduled in approximately 10-14 days for incision check and XR.

## 2020-07-10 NOTE — Anesthesia Preprocedure Evaluation (Signed)
Anesthesia Evaluation  Patient identified by MRN, date of birth, ID band Patient awake    Reviewed: Allergy & Precautions, H&P , NPO status , Patient's Chart, lab work & pertinent test results, reviewed documented beta blocker date and time   Airway Mallampati: II  TM Distance: >3 FB Neck ROM: full    Dental no notable dental hx. (+) Teeth Intact   Pulmonary neg pulmonary ROS,    Pulmonary exam normal breath sounds clear to auscultation       Cardiovascular Exercise Tolerance: Good hypertension, negative cardio ROS   Rhythm:regular Rate:Normal     Neuro/Psych Anxiety  Neuromuscular disease negative psych ROS   GI/Hepatic negative GI ROS, Neg liver ROS,   Endo/Other  negative endocrine ROSdiabetes, Type 2  Renal/GU CRFRenal disease  negative genitourinary   Musculoskeletal   Abdominal   Peds  Hematology  (+) Blood dyscrasia, anemia ,   Anesthesia Other Findings   Reproductive/Obstetrics negative OB ROS                             Anesthesia Physical Anesthesia Plan  ASA: II  Anesthesia Plan: General   Post-op Pain Management:    Induction:   PONV Risk Score and Plan: Ondansetron  Airway Management Planned:   Additional Equipment:   Intra-op Plan:   Post-operative Plan:   Informed Consent: I have reviewed the patients History and Physical, chart, labs and discussed the procedure including the risks, benefits and alternatives for the proposed anesthesia with the patient or authorized representative who has indicated his/her understanding and acceptance.     Dental Advisory Given  Plan Discussed with: CRNA  Anesthesia Plan Comments:         Anesthesia Quick Evaluation

## 2020-07-10 NOTE — Anesthesia Procedure Notes (Signed)
Anesthesia Regional Block: Interscalene brachial plexus block   Pre-Anesthetic Checklist: ,, timeout performed, Correct Patient, Correct Site, Correct Laterality, Correct Procedure, Correct Position, site marked, Risks and benefits discussed,  Surgical consent,  Pre-op evaluation,  At surgeon's request and post-op pain management  Laterality: Left  Prep: Maximum Sterile Barrier Precautions used, chloraprep       Needles:  Injection technique: Single-shot  Needle Type: Stimulator Needle - 40     Needle Length: 5cm  Needle Gauge: 22   Needle insertion depth: 1 cm   Additional Needles:   Procedures:,,,, ultrasound used (permanent image in chart),,,,   Nerve Stimulator or Paresthesia:  Response: Thenar twitch, 0.6 mA,   Additional Responses:   Narrative:  Start time: 07/10/2020 7:20 AM End time: 07/10/2020 7:37 AM Injection made incrementally with aspirations every 5 mL.  Performed by: Personally  Anesthesiologist: Louann Sjogren, MD

## 2020-07-10 NOTE — Transfer of Care (Addendum)
Immediate Anesthesia Transfer of Care Note  Patient: Dellia Beckwith  Procedure(s) Performed: REVERSE SHOULDER ARTHROPLASTY (Left Shoulder)  Patient Location: PACU  Anesthesia Type:General  Level of Consciousness: awake, alert , oriented and patient cooperative  Airway & Oxygen Therapy: Patient Spontanous Breathing and Patient connected to nasal cannula oxygen  Post-op Assessment: Report given to RN, Post -op Vital signs reviewed and stable and Patient moving all extremities X 4  Post vital signs: Reviewed and stable  Last Vitals:  Vitals Value Taken Time  BP 126/59 07/10/20 1134  Temp    Pulse 85 07/10/20 1137  Resp 19 07/10/20 1137  SpO2 97 % 07/10/20 1137  Vitals shown include unvalidated device data.  Last Pain:  Vitals:   07/10/20 0700  TempSrc: Oral  PainSc: 0-No pain      Patients Stated Pain Goal: 5 (12/10/99 4996)  Complications: No complications documented.

## 2020-07-10 NOTE — Anesthesia Postprocedure Evaluation (Signed)
Anesthesia Post Note  Patient: Stacy Webb  Procedure(s) Performed: REVERSE SHOULDER ARTHROPLASTY (Left Shoulder)  Patient location during evaluation: PACU Anesthesia Type: General and Regional Level of consciousness: awake Pain management: pain level controlled Vital Signs Assessment: post-procedure vital signs reviewed and stable Respiratory status: spontaneous breathing and respiratory function stable Cardiovascular status: blood pressure returned to baseline and stable Postop Assessment: no headache and no apparent nausea or vomiting Anesthetic complications: no   No complications documented.   Last Vitals:  Vitals:   07/10/20 1134 07/10/20 1145  BP: (!) 126/59 (!) 129/59  Pulse: 88 80  Resp: (!) 21 19  Temp: 36.7 C   SpO2: 93% 96%    Last Pain:  Vitals:   07/10/20 1145  TempSrc:   PainSc: 0-No pain                 Louann Sjogren

## 2020-07-11 ENCOUNTER — Encounter (HOSPITAL_COMMUNITY): Payer: Self-pay | Admitting: Orthopedic Surgery

## 2020-07-11 DIAGNOSIS — M12512 Traumatic arthropathy, left shoulder: Secondary | ICD-10-CM | POA: Diagnosis not present

## 2020-07-11 LAB — CBC
HCT: 22.9 % — ABNORMAL LOW (ref 36.0–46.0)
Hemoglobin: 7.4 g/dL — ABNORMAL LOW (ref 12.0–15.0)
MCH: 28.2 pg (ref 26.0–34.0)
MCHC: 32.3 g/dL (ref 30.0–36.0)
MCV: 87.4 fL (ref 80.0–100.0)
Platelets: 208 10*3/uL (ref 150–400)
RBC: 2.62 MIL/uL — ABNORMAL LOW (ref 3.87–5.11)
RDW: 14.4 % (ref 11.5–15.5)
WBC: 10.1 10*3/uL (ref 4.0–10.5)
nRBC: 0 % (ref 0.0–0.2)

## 2020-07-11 LAB — GLUCOSE, CAPILLARY: Glucose-Capillary: 134 mg/dL — ABNORMAL HIGH (ref 70–99)

## 2020-07-11 MED ORDER — CELECOXIB 100 MG PO CAPS: 100.0000 mg | ORAL_CAPSULE | Freq: Every day | ORAL | 0 refills | Status: AC

## 2020-07-11 MED ORDER — ACETAMINOPHEN 500 MG PO TABS: 1000.0000 mg | ORAL_TABLET | Freq: Three times a day (TID) | ORAL | 0 refills | Status: AC

## 2020-07-11 MED ORDER — OXYCODONE HCL 5 MG PO TABS: 5.0000 mg | ORAL_TABLET | ORAL | 0 refills | Status: AC | PRN

## 2020-07-11 MED ORDER — ONDANSETRON HCL 4 MG PO TABS: 4.0000 mg | ORAL_TABLET | Freq: Three times a day (TID) | ORAL | 0 refills | Status: AC | PRN

## 2020-07-11 MED ORDER — ASPIRIN EC 81 MG PO TBEC: 81.0000 mg | DELAYED_RELEASE_TABLET | Freq: Two times a day (BID) | ORAL | 0 refills | Status: AC

## 2020-07-11 NOTE — Progress Notes (Signed)
Nsg Discharge Note  Admit Date:  07/10/2020 Discharge date: 07/11/2020   Stacy Webb to be D/C'd Home per MD order.  AVS completed.  Copy for chart, and copy for patient signed, and dated. Patient/caregiver able to verbalize understanding.  Discharge Medication: Allergies as of 07/11/2020      Reactions   Clindamycin/lincomycin Other (See Comments)   Mouth taste like metal   Enalapril Other (See Comments), Cough      Medication List    STOP taking these medications   ibuprofen 800 MG tablet Commonly known as: ADVIL   traMADol 50 MG tablet Commonly known as: ULTRAM     TAKE these medications   Accu-Chek Aviva Plus test strip Generic drug: glucose blood   acetaminophen 500 MG tablet Commonly known as: TYLENOL Take 2 tablets (1,000 mg total) by mouth every 8 (eight) hours for 14 days. What changed:   how much to take  when to take this  reasons to take this   aspirin EC 81 MG tablet Take 1 tablet (81 mg total) by mouth in the morning and at bedtime. Swallow whole.   atorvastatin 20 MG tablet Commonly known as: LIPITOR Take 20 mg by mouth daily.   celecoxib 100 MG capsule Commonly known as: CeleBREX Take 1 capsule (100 mg total) by mouth daily for 14 days.   diltiazem 360 MG 24 hr capsule Commonly known as: CARDIZEM CD Take 360 mg by mouth daily.   docusate sodium 100 MG capsule Commonly known as: COLACE Take 200 mg by mouth at bedtime.   gabapentin 100 MG capsule Commonly known as: NEURONTIN Take 100 mg by mouth 2 (two) times daily.   hydrochlorothiazide 25 MG tablet Commonly known as: HYDRODIURIL Take 25 mg by mouth daily.   losartan 50 MG tablet Commonly known as: COZAAR Take 50 mg by mouth daily.   metFORMIN 1000 MG tablet Commonly known as: GLUCOPHAGE Take 1,000 mg by mouth 2 (two) times daily with a meal.   mupirocin ointment 2 % Commonly known as: BACTROBAN Apply 1 application topically 2 (two) times daily.   ondansetron 4 MG  tablet Commonly known as: Zofran Take 1 tablet (4 mg total) by mouth every 8 (eight) hours as needed for up to 14 days for nausea or vomiting.   oxyCODONE 5 MG immediate release tablet Commonly known as: Roxicodone Take 1 tablet (5 mg total) by mouth every 4 (four) hours as needed for up to 7 days.   polyethylene glycol powder 17 GM/SCOOP powder Commonly known as: MiraLax Take 17 g by mouth daily. To prevent constipation   vitamin B-12 500 MCG tablet Commonly known as: CYANOCOBALAMIN Take 500 mcg by mouth 2 (two) times daily.   Zinc Gluconate 30 MG Tabs Take 30 mg by mouth 3 (three) times a week.       Discharge Assessment: Vitals:   07/11/20 0748 07/11/20 0935  BP: 137/80 (!) 148/72  Pulse: 77 75  Resp: 18 19  Temp: 98 F (36.7 C)   SpO2: 99% 98%   Skin clean, dry and intact without evidence of skin break down, no evidence of skin tears noted. IV catheter discontinued intact. Site without signs and symptoms of complications - no redness or edema noted at insertion site, patient denies c/o pain - only slight tenderness at site.  Dressing with slight pressure applied.  D/c Instructions-Education: Discharge instructions given to patient/family with verbalized understanding. D/c education completed with patient/family including follow up instructions, medication list, d/c activities limitations  if indicated, with other d/c instructions as indicated by MD - patient able to verbalize understanding, all questions fully answered. Patient instructed to return to ED, call 911, or call MD for any changes in condition.  Patient escorted via [place], and D/C home via private auto.  Dorcas Mcmurray, LPN 06/13/8586 5:02 PM

## 2020-07-11 NOTE — Evaluation (Signed)
Occupational Therapy Evaluation Patient Details Name: Stacy Webb MRN: [id number] DOB: [birth year] Today's Date: 07/11/2020    History of Present Illness Stacy Webb  has presented today for surgery, with the diagnosis of Left shoulder Reverse Shoulder Arthroplasty..  The various methods of treatment have been discussed with the patient and family. After consideration of risks, benefits and other options for treatment, the patient has consented to  Procedure(s):  REVERSE SHOULDER ARTHROPLASTY (Left) as a surgical intervention.  The patient's history has been reviewed, patient examined, no change in status, stable for surgery.  I have reviewed the patient's chart and labs.  Questions were answered to the patient's satisfaction.       Procedure discussed with the patient and all questions were answered to her satisfaction.  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  She will benefit from reverse shoulder arthroplasty.  Consent has been signed.  She will be admitted for over night observation following the procedure.   Clinical Impression   Pt agreeable to OT evaluation. Pt and family instructed and modeled how to doff and don L shoulder abduction sling. Pt and family were provided with handout with general instructions on doffing and donning sling. Pt able to complete stand pivot transfer from EOB to chair and back with SPV. Pt demonstrated bed mobility with MOD I assist level. Pt discharging likely today. Pt in care of nurses till discharge home with the recommended services below to continue rehab on her L UE.     Follow Up Recommendations  Outpatient OT;Supervision - Intermittent    Equipment Recommendations  None recommended by OT           Precautions / Restrictions Precautions Precautions: Shoulder Type of Shoulder Precautions: Post L Rotator cuff arthroplasty Shoulder Interventions: Shoulder sling/immobilizer;Shoulder abduction  pillow Precaution Booklet Issued: No Restrictions Weight Bearing Restrictions:  (Non seen in post-op note)      Mobility Bed Mobility Overal bed mobility: Modified Independent             General bed mobility comments: slow, labored movement    Transfers Overall transfer level: Needs assistance Equipment used: None Transfers: Stand Pivot Transfers;Sit to/from Stand           General transfer comment: SPV for EOB to chair and back transfer.    Balance Overall balance assessment: Independent                                         ADL either performed or assessed with clinical judgement   ADL Overall ADL's : Needs assistance/impaired                         Toilet Transfer: Supervision/safety;Stand-pivot Armed forces technical officer Details (indicate cue type and reason): Simulated via stand pivot from EOB to chair and back  with SPV.                 Vision Baseline Vision/History: Wears glasses;Cataracts (Had cataracts surgery in past) Wears Glasses: Reading only Patient Visual Report: No change from baseline                  Pertinent Vitals/Pain Pain Assessment: 0-10 Pain Score: 8  Pain Location: L shoulder Pain Descriptors / Indicators: Aching Pain Intervention(s): Limited activity within patient's tolerance;Monitored during session (Ice wrap machine on L shoulder at start  and end of session.)     Hand Dominance Right   Extremity/Trunk Assessment Upper Extremity Assessment Upper Extremity Assessment: LUE deficits/detail LUE Deficits / Details: Post L rotator cuff arthroplast; L UE immobilized in abduction sling.   Lower Extremity Assessment Lower Extremity Assessment: Overall WFL for tasks assessed   Cervical / Trunk Assessment Cervical / Trunk Assessment: Normal   Communication Communication Communication: No difficulties   Cognition Arousal/Alertness: Awake/alert Behavior During Therapy: WFL for tasks  assessed/performed Overall Cognitive Status: Within Functional Limits for tasks assessed                                           Exercises Exercises: Other exercises Other Exercises Other Exercises: Pt and family were instructed on how to don and doff L shoulder abduction sling.   Shoulder Instructions      Home Living Family/patient expects to be discharged to:: Private residence Living Arrangements: Children (lives with son but he works) Available Help at Discharge: Family;Friend(s);Available 24 hours/day (sister will be available 24/7 for at least 2 weeks from this date.) Type of Home: House Home Access: Ramped entrance     Home Layout: One level     Bathroom Shower/Tub: Occupational psychologist: Standard     Home Equipment: Environmental consultant - 2 wheels;Cane - single point;Cane - quad;Bressi - 4 wheels;Bedside commode;Shower seat          Prior Functioning/Environment Level of Independence: Independent        Comments: Independent for ADL's and IADL's        OT Problem List: Decreased range of motion;Decreased strength;Impaired UE functional use      OT Treatment/Interventions:      OT Goals(Current goals can be found in the care plan section) Acute Rehab OT Goals Patient Stated Goal: return home  OT Frequency:      AM-PAC OT "6 Clicks" Daily Activity     Outcome Measure Help from another person eating meals?: None Help from another person taking care of personal grooming?: None Help from another person toileting, which includes using toliet, bedpan, or urinal?: A Little Help from another person bathing (including washing, rinsing, drying)?: A Little Help from another person to put on and taking off regular upper body clothing?: A Little Help from another person to put on and taking off regular lower body clothing?: A Little 6 Click Score: 20   End of Session Equipment Utilized During Treatment: Other (comment) (L shoulder abduction  sling)  Activity Tolerance: Patient tolerated treatment well Patient left: in bed;with call bell/phone within reach;with family/visitor present  OT Visit Diagnosis: Pain Pain - Right/Left: Left Pain - part of body: Shoulder                Time: 6440-3474 OT Time Calculation (min): 45 min Charges:  OT General Charges $OT Visit: 1 Visit OT Evaluation $OT Eval Low Complexity: 1 Low OT Treatments $Self Care/Home Management : 8-22 mins  Kendy Haston OT, MOT   Larey Seat 07/11/2020, 11:20 AM

## 2020-07-11 NOTE — Discharge Summary (Signed)
Patient ID: KARYNA BESSLER MRN: [id number] DOB/AGE: Aug 26, 1945 75 y.o.  Admit date: 07/10/2020 Discharge date: 07/11/2020  Admission Diagnoses: Left shoulder rotator cuff arthropathy  Discharge Diagnoses:  Active Problems:   Rotator cuff arthropathy of left shoulder   Past Medical History:  Diagnosis Date  . Anemia   . Anxiety   . Arthritis    fingers  . 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[place])   . Diabetes mellitus without complication ([place])   . Excessive hair growth 07/19/2015  . Heart murmur   . History of kidney stones   . Hyperlipidemia   . Hypertension   . Neuromuscular disorder ([place])    diabetic neuropathy  . Neuropathy   . Postmenopausal 07/19/2015     Procedures Performed: Left reverse shoulder arthroplasty  Discharged Condition: good  Hospital Course: Patient brought in as an outpatient for surgery.  Tolerated procedure well.  Was kept for monitoring overnight for pain control and medical monitoring postop and was found to be stable for DC home the morning after surgery.  Her postop hemoglobin is 7.4, but she remains asymptomatic.  She is chronically anemic.  As long as she remains asymptomatic, transfusion is not necessary.  This was discussed with the patient and she is comfortable going home with family.  Patient was instructed on specific activity restrictions and all questions were answered.   Consults: None  Significant Diagnostic Studies: No additional pertinent studies  Treatments: Surgery  Discharge Exam:  Vitals:   07/11/20 0509 07/11/20 0748  BP: (!) 155/69 137/80  Pulse: 77 77  Resp: 20 18  Temp: 98.1 F (36.7 C) 98 F (36.7 C)  SpO2: 98% 99%     Alert and oriented, no acute distress  Left shoulder dressing is clean, dry and intact Ice pack in place Sling in appropriate position Sensation intact over the axillary patch Active motion intact in AIN (ok sign), PIN (thumbs up), and ulnar nerve (cross finger) Sensation intact in the  index finger, small finger and dorsal 1st webspace Fingers are warm and well perfused.  2+ radial pulse  Disposition: Discharge disposition: 01-Home or Self Care        Allergies as of 07/11/2020      Reactions   Clindamycin/lincomycin Other (See Comments)   Mouth taste like metal   Enalapril Other (See Comments), Cough      Medication List    STOP taking these medications   ibuprofen 800 MG tablet Commonly known as: ADVIL   traMADol 50 MG tablet Commonly known as: ULTRAM     TAKE these medications   Accu-Chek Aviva Plus test strip Generic drug: glucose blood   acetaminophen 500 MG tablet Commonly known as: TYLENOL Take 2 tablets (1,000 mg total) by mouth every 8 (eight) hours for 14 days. What changed:   how much to take  when to take this  reasons to take this   aspirin EC 81 MG tablet Take 1 tablet (81 mg total) by mouth in the morning and at bedtime. Swallow whole.   atorvastatin 20 MG tablet Commonly known as: LIPITOR Take 20 mg by mouth daily.   celecoxib 100 MG capsule Commonly known as: CeleBREX Take 1 capsule (100 mg total) by mouth daily for 14 days.   diltiazem 360 MG 24 hr capsule Commonly known as: CARDIZEM CD Take 360 mg by mouth daily.   docusate sodium 100 MG capsule Commonly known as: COLACE Take 200 mg by mouth at bedtime.   gabapentin 100 MG  capsule Commonly known as: NEURONTIN Take 100 mg by mouth 2 (two) times daily.   hydrochlorothiazide 25 MG tablet Commonly known as: HYDRODIURIL Take 25 mg by mouth daily.   losartan 50 MG tablet Commonly known as: COZAAR Take 50 mg by mouth daily.   metFORMIN 1000 MG tablet Commonly known as: GLUCOPHAGE Take 1,000 mg by mouth 2 (two) times daily with a meal.   mupirocin ointment 2 % Commonly known as: BACTROBAN Apply 1 application topically 2 (two) times daily.   ondansetron 4 MG tablet Commonly known as: Zofran Take 1 tablet (4 mg total) by mouth every 8 (eight) hours as needed  for up to 14 days for nausea or vomiting.   oxyCODONE 5 MG immediate release tablet Commonly known as: Roxicodone Take 1 tablet (5 mg total) by mouth every 4 (four) hours as needed for up to 7 days.   polyethylene glycol powder 17 GM/SCOOP powder Commonly known as: MiraLax Take 17 g by mouth daily. To prevent constipation   vitamin B-12 500 MCG tablet Commonly known as: CYANOCOBALAMIN Take 500 mcg by mouth 2 (two) times daily.   Zinc Gluconate 30 MG Tabs Take 30 mg by mouth 3 (three) times a week.       Follow-up Information    [name], MD.   Specialties: Orthopedic Surgery, Sports Medicine Why: 10-14 days for suture removal/incision check Contact information: [address] [address] [phone]

## 2020-07-19 ENCOUNTER — Encounter: Payer: Self-pay | Admitting: Orthopedic Surgery

## 2020-07-19 ENCOUNTER — Other Ambulatory Visit: Payer: Self-pay

## 2020-07-19 ENCOUNTER — Ambulatory Visit (INDEPENDENT_AMBULATORY_CARE_PROVIDER_SITE_OTHER): Payer: Medicare Other | Admitting: Orthopedic Surgery

## 2020-07-19 ENCOUNTER — Ambulatory Visit: Payer: Medicare Other

## 2020-07-19 DIAGNOSIS — M12812 Other specific arthropathies, not elsewhere classified, left shoulder: Secondary | ICD-10-CM

## 2020-07-19 NOTE — Progress Notes (Signed)
Orthopaedic Postop Note  Assessment: Stacy Webb is a 75 y.o. female s/p Left Reverse Shoulder Arthroplasty  DOS: 07/10/20  Plan: Sutures were trimmed, steri strips were placed Ok to remove the abduction pillow; can stop using the sling around 4 weeks postop Physical therapy prescription and protocol provided; first appointment 07/25/20 Anticipated progression discussed, XR reviewed in clinic No additional medications needed at this time Aspirin 81 mg BID x 6 weeks Follow up 4 weeks   No orders of the defined types were placed in this encounter.    Follow-up: Return in about 4 weeks (around 08/16/2020).  XR at next visit: Left shoulder  Subjective:  Chief Complaint  Patient presents with  . Shoulder Injury    Left shoulder pain, Patient reports some painful today,     History of Present Illness: Stacy Webb is a 75 y.o. female who presents following the above stated procedure.  She is doing well overall.  She is only taking tylenol for pain.  She has not required Oxycodone in several days.  No numbness or tingling.  Sleeping in the sling.  Minimal movement thus far.  Completing wrist, hand and elbow motions.   Review of Systems: No fevers or chills No numbness or tingling No Chest Pain No shortness of breath Occasional lightheadeness   Objective: There were no vitals taken for this visit.  Physical Exam:  Alert and oriented.  No acute distress  Left shoulder surgical incision is healing well.  No surrounding erythema or drainage.  Sensation intact in the index finger, small finger and 1st dorsal webspace.  Intact sensation in the axillary patch, deltoid fires.  Active motion in biceps, finger flexion, finger extension, finger abduction.  2+ radial pulse.  Fingers are warm and well perfused.  Mild irritation over the olecranon.  IMAGING: I personally ordered and reviewed the following images:  XR of the Left shoulder was obtained in clinic today and  demonstrates a reverse shoulder arthroplasty with implants in good position.  No evidence of acute injury or subsidence of implants.   Impression: Left shoulder arthroplasty in good position   [name], MD 07/19/2020 11:27 AM

## 2020-07-19 NOTE — Patient Instructions (Signed)
Ok to stop using black pillow Ok to stop using the sling in 2-3 weeks Start PT next week F/u 4-5 weeks (~6 weeks postop) Medications as needed

## 2020-07-25 ENCOUNTER — Ambulatory Visit (HOSPITAL_COMMUNITY): Payer: Medicare Other | Attending: Orthopedic Surgery | Admitting: Occupational Therapy

## 2020-07-25 ENCOUNTER — Ambulatory Visit: Payer: Medicare Other | Admitting: Orthopedic Surgery

## 2020-07-25 ENCOUNTER — Encounter (HOSPITAL_COMMUNITY): Payer: Self-pay | Admitting: Occupational Therapy

## 2020-07-25 ENCOUNTER — Other Ambulatory Visit: Payer: Self-pay

## 2020-07-25 DIAGNOSIS — R29898 Other symptoms and signs involving the musculoskeletal system: Secondary | ICD-10-CM | POA: Insufficient documentation

## 2020-07-25 DIAGNOSIS — M25512 Pain in left shoulder: Secondary | ICD-10-CM | POA: Diagnosis present

## 2020-07-25 DIAGNOSIS — M25612 Stiffness of left shoulder, not elsewhere classified: Secondary | ICD-10-CM | POA: Insufficient documentation

## 2020-07-25 NOTE — Patient Instructions (Signed)
1) SHOULDER: Flexion On Table   Place hands on towel placed on table, elbows straight. Lean forward with you upper body, pushing towel away from body.  __10-15_ reps per set, __2-3_ sets per day  2) Abduction (Passive)   With arm out to side, resting on towel placed on table with palm DOWN, keeping trunk away from table, lean to the side while pushing towel away from body.  Repeat __10-15__ times. Do __2-3__ sessions per day.  Copyright  VHI. All rights reserved.     3) Internal Rotation (Assistive)   Seated with elbow bent at right angle and held against side, slide arm on table surface in an inward arc keeping elbow anchored in place. Repeat __10-15__ times. Do __2-3__ sessions per day. Activity: Use this motion to brush crumbs off the table.  Copyright  VHI. All rights reserved.   

## 2020-07-25 NOTE — Therapy (Signed)
[facility] [address] [place], [place], [address] Phone: [phone]   Fax:  [phone]  Occupational Therapy Evaluation  Patient Details  Name: Stacy Webb MRN: [id number] Date of Birth: [birth year] Referring Provider (OT): Dr. Larena Glassman   Encounter Date: 07/25/2020   OT End of Session - 07/25/20 1505    Visit Number 1    Number of Visits 16    Date for OT Re-Evaluation 09/23/20   mini-reassessment 08/22/2020   Authorization Type [employer] Medicare, $30 copay    Authorization Time Period no visit limit    Progress Note Due on Visit 10    OT Start Time 1426    OT Stop Time 1501    OT Time Calculation (min) 35 min    Activity Tolerance Patient tolerated treatment well    Behavior During Therapy [facility] for tasks assessed/performed           Past Medical History:  Diagnosis Date  . Anemia   . Anxiety   . Arthritis    fingers  . 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[place])   . Diabetes mellitus without complication ([place])   . Excessive hair growth 07/19/2015  . Heart murmur   . History of kidney stones   . Hyperlipidemia   . Hypertension   . Neuromuscular disorder ([place])    diabetic neuropathy  . Neuropathy   . Postmenopausal 07/19/2015    Past Surgical History:  Procedure Laterality Date  . ANTERIOR AND POSTERIOR REPAIR N/A 04/28/2018   Procedure: ANTERIOR (CYSTOCELE) AND POSTERIOR REPAIR (RECTOCELE);  Surgeon: Jonnie Kind, MD;  Location: [facility] ORS;  Service: Gynecology;  Laterality: N/A;  . APPENDECTOMY    . CARPAL TUNNEL RELEASE Bilateral 2001  . CESAREAN SECTION    . COLONOSCOPY N/A 08/28/2012   Procedure: COLONOSCOPY;  Surgeon: Rogene Houston, MD;  Location: [facility] SU[facility];  Service: Endoscopy;  Laterality: N/A;  915  . COLONOSCOPY N/A 12/25/2017   Procedure: COLONOSCOPY;  Surgeon: Rogene Houston, MD;  Location: [facility];  Service: Endoscopy;  Laterality: N/A;  12:45  . REVERSE SHOULDER ARTHROPLASTY Left 07/10/2020    Procedure: REVERSE SHOULDER ARTHROPLASTY;  Surgeon: [name], MD;  Location: [facility] ORS;  Service: Orthopedics;  Laterality: Left;    There were no vitals filed for this visit.   Subjective Assessment - 07/25/20 1503    Subjective  S: It's been feeling pretty good I think.    Patient is accompanied by: Family member   sister   Pertinent History Pt is a 75 y/o female s/p left reverse total shoulder replacement on 07/10/20, presents in sling without abduction pillow. Pt initially injured her shoulder in January after a fall. Pt was referred to occupational therapy for evaluation and treatment by Dr. Larena Glassman.    Special Tests FOTO: 37/100    Patient Stated Goals To be able to use my arms.    Currently in Pain? No/denies             [facility] OT Assessment - 07/25/20 1423      Assessment   Medical Diagnosis s/p left reverse TSA    Referring Provider (OT) Dr. Larena Glassman    Onset Date/Surgical Date 07/10/20    Hand Dominance Right    Next MD Visit 08/16/2020    Prior Therapy Acute OT after sx      Precautions   Precautions Shoulder    Type of Shoulder Precautions See protocol  Shoulder Interventions Shoulder sling/immobilizer;Off for dressing/bathing/exercises      Balance Screen   Has the patient fallen in the past 6 months Yes    How many times? 1    Has the patient had a decrease in activity level because of a fear of falling?  No    Is the patient reluctant to leave their home because of a fear of falling?  No      Prior Function   Level of Independence Independent    Vocation Retired    Leisure housecleaning      ADL   ADL comments Pt is unable to use LUE for any ADLs at this time due to pain and precautions.      Written Expression   Dominant Hand Right      Cognition   Overall Cognitive Status Within Functional Limits for tasks assessed      Observation/Other Assessments   Focus on Therapeutic Outcomes (FOTO)  37/100      ROM / Strength   AROM / PROM /  Strength AROM;PROM;Strength      Palpation   Palpation comment max fascial restrictions along proximal upper arm and anterior shoulder, mod fascial restrictions along trapezius and scapular regions      AROM   Overall AROM  Deficits;Unable to assess;Due to precautions      PROM   Overall PROM Comments Assessed supine, er/IR adducted    PROM Assessment Site Shoulder    Right/Left Shoulder Left    Left Shoulder Flexion 105 Degrees    Left Shoulder ABduction 94 Degrees    Left Shoulder Internal Rotation 90 Degrees    Left Shoulder External Rotation 20 Degrees      Strength   Overall Strength Deficits;Unable to assess;Due to precautions                           OT Education - 07/25/20 1451    Education Details table slides    Person(s) Educated Patient;Other (comment)   sister   Methods Explanation;Demonstration    Comprehension Verbalized understanding;Returned demonstration            OT Short Term Goals - 07/25/20 1529      OT SHORT TERM GOAL #1   Title Patient will be educated and demonstrate understanding of HEP in order to increase functional use of her LUE during basic daily tasks while increasing ROM and strength.    Time 4    Period Weeks    Status New    Target Date 08/24/20      OT SHORT TERM GOAL #2   Title Pt will increase LUE P/ROM to [facility] to improve ability to complete dressing tasks with minimal compensatory strategies.    Time 4    Period Weeks    Status New      OT SHORT TERM GOAL #3   Title Pt will increase LUE strength to 3+/5 to improve ability to reach items at waist to chest height during ADLs.    Time 4    Period Weeks    Status New             OT Long Term Goals - 07/25/20 1531      OT LONG TERM GOAL #1   Title Pt will decrease LUE pain to 3/10 or less to improve ability to use LUE as assist during ADLs and leisure tasks.    Time 8    Period Weeks  Status New    Target Date 09/23/20      OT LONG TERM GOAL #2    Title Pt will decrease LUE fascial restrictions to minimal amounts to improve mobility required for functional reaching tasks.    Time 8    Period Weeks    Status New      OT LONG TERM GOAL #3   Title Pt will increase LUE A/ROM to [facility] to improve ability to perform functional reaching overhead and behind back during dressing and bathing tasks.    Time 8    Status New      OT LONG TERM GOAL #4   Title Pt will increase LUE strength to 4+/5 or greater to improve ability to perform housekeeping tasks using LUE as non-dominant.    Time 8    Period Weeks    Status New                 Plan - 07/25/20 1506    Clinical Impression Statement A: Pt is a 75 y/o female s/p left reverse total shoulder replacement on 07/10/20 presenting with deficits limiting ROM, strength, and functional use of LUE as well as causing pain and fascial restrictions. Pt educated on expectations of therapy as well as HEP for table slides.    OT Occupational Profile and History Problem Focused Assessment - Including review of records relating to presenting problem    Occupational performance deficits (Please refer to evaluation for details): ADL's;IADL's;Leisure    Body Structure / Function / Physical Skills ADL;Endurance;UE functional use;Fascial restriction;Muscle spasms;Pain;ROM;IADL;Strength;Edema;Mobility    Rehab Potential Good    Clinical Decision Making Limited treatment options, no task modification necessary    Comorbidities Affecting Occupational Performance: None    Modification or Assistance to Complete Evaluation  No modification of tasks or assist necessary to complete eval    OT Frequency 2x / week    OT Duration 8 weeks    OT Treatment/Interventions Self-care/ADL training;Ultrasound;DME and/or AE instruction;Patient/family education;Passive range of motion;Cryotherapy;Electrical Stimulation;Splinting;Functional Mobility Training;Moist Heat;Therapeutic exercise;Manual Therapy;Therapeutic activities     Plan P: Pt will benefit from skilled OT services to decrease pain and fascial restrictions, increase joint ROM, strength, and functional use of LUE. Treatment plan: myofascial release, manual techniques, P/ROM, AA/ROM, A/ROM, general LUE strengthening and stability, scapular mobility/stability/strengthening, modalities prn    OT Home Exercise Plan eval: table slides    Consulted and Agree with Plan of Care Patient;Family member/caregiver    Family Member Consulted sister           Patient will benefit from skilled therapeutic intervention in order to improve the following deficits and impairments:   Body Structure / Function / Physical Skills: ADL,Endurance,UE functional use,Fascial restriction,Muscle spasms,Pain,ROM,IADL,Strength,Edema,Mobility       Visit Diagnosis: Stiffness of left shoulder, not elsewhere classified  Acute pain of left shoulder  Other symptoms and signs involving the musculoskeletal system    Problem List Patient Active Problem List   Diagnosis Date Noted  . Rotator cuff arthropathy of left shoulder 07/10/2020  . Absolute anemia 10/30/2017  . Constipation 06/10/2017  . Hemorrhoids 06/10/2017  . Cystocele with rectocele 06/10/2017  . Rectocele 06/10/2017  . Diabetic foot ulcer ([place]) 09/27/2016  . CKD (chronic kidney disease), stage III ([place]) 09/27/2016  . 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[place]) 09/27/2016  . Hyperlipidemia 09/27/2016  . Hypertension 09/27/2016  . Diabetic ulcer of foot associated with diabetes mellitus due to underlying condition, limited to breakdown of skin ([place]) 09/27/2016  .  Postmenopausal 07/19/2015  . Essential hypertension, benign 08/11/2012  . High cholesterol 08/11/2012  . Family hx of colon cancer 08/11/2012  . Diabetes [facility]) 08/11/2012   Guadelupe Sabin, OTR/L  [phone] 07/25/2020, 3:34 PM  [facility] [address] [place], [place], [address] Phone: [phone]   Fax:   [phone]  Name: Stacy Webb MRN: [id number] Date of Birth: 04/04/1946

## 2020-07-28 ENCOUNTER — Ambulatory Visit (HOSPITAL_COMMUNITY): Payer: Medicare Other | Admitting: Specialist

## 2020-07-28 ENCOUNTER — Encounter (HOSPITAL_COMMUNITY): Payer: Self-pay | Admitting: Specialist

## 2020-07-28 ENCOUNTER — Other Ambulatory Visit: Payer: Self-pay

## 2020-07-28 DIAGNOSIS — M25512 Pain in left shoulder: Secondary | ICD-10-CM

## 2020-07-28 DIAGNOSIS — R29898 Other symptoms and signs involving the musculoskeletal system: Secondary | ICD-10-CM

## 2020-07-28 DIAGNOSIS — M25612 Stiffness of left shoulder, not elsewhere classified: Secondary | ICD-10-CM

## 2020-07-28 NOTE — Therapy (Signed)
[place], [place], [address] Phone: [phone]   Fax:  [phone]  Occupational Therapy Treatment  Patient Details  Name: Stacy Webb MRN: [id number] Date of Birth: 01/20/1946 Referring Provider (OT): Dr. Larena Glassman   Encounter Date: 07/28/2020   OT End of Session - 07/28/20 1426    Visit Number 2    Number of Visits 16    Date for OT Re-Evaluation 09/23/20   mini reassess on 08/22/20   Authorization Type U[employer] Medicare, $30 copay    Authorization Time Period no visit limit    Progress Note Due on Visit 10    OT Start Time 1035    OT Stop Time 1110    OT Time Calculation (min) 35 min           Past Medical History:  Diagnosis Date  . Anemia   . Anxiety   . Arthritis    fingers  . 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[place])   . Diabetes mellitus without complication ([place])   . Excessive hair growth 07/19/2015  . Heart murmur   . History of kidney stones   . Hyperlipidemia   . Hypertension   . Neuromuscular disorder ([place])    diabetic neuropathy  . Neuropathy   . Postmenopausal 07/19/2015    Past Surgical History:  Procedure Laterality Date  . ANTERIOR AND POSTERIOR REPAIR N/A 04/28/2018   Procedure: ANTERIOR (CYSTOCELE) AND POSTERIOR REPAIR (RECTOCELE);  Surgeon: Jonnie Kind, MD;  Location: [facility];  Service: Gynecology;  Laterality: N/A;  . APPENDECTOMY    . CARPAL TUNNEL RELEASE Bilateral 2001  . CESAREAN SECTION    . COLONOSCOPY N/A 08/28/2012   Procedure: COLONOSCOPY;  Surgeon: Rogene Houston, MD;  Location: [facility];  Service: Endoscopy;  Laterality: N/A;  915  . COLONOSCOPY N/A 12/25/2017   Procedure: COLONOSCOPY;  Surgeon: Rogene Houston, MD;  [facility];  Service: Endoscopy;  Laterality: N/A;  12:45  . REVERSE SHOULDER ARTHROPLASTY Left 07/10/2020   Procedure: REVERSE SHOULDER ARTHROPLASTY;  Surgeon: [name], MD;  Location: [facility];  Service: Orthopedics;   Laterality: Left;    There were no vitals filed for this visit.   Subjective Assessment - 07/28/20 1423    Subjective  S:  I have a lot of pain in my elbow from the sling rubbing on my elbow.    Currently in Pain? Yes    Pain Score 4     Pain Location Shoulder    Pain Orientation Left    Pain Descriptors / Indicators Aching    Pain Type Surgical pain              [facility] OT Assessment - 07/28/20 0001      Assessment   Medical Diagnosis s/p left reverse TSA    Referring Provider (OT) Dr. Larena Glassman    Onset Date/Surgical Date 07/10/20    Hand Dominance Right    Next MD Visit 08/16/2020    Prior Therapy Acute OT after sx      Precautions   Precautions Shoulder    Type of Shoulder Precautions See protocol    Shoulder Interventions Shoulder sling/immobilizer;Off for dressing/bathing/exercises                    OT Treatments/Exercises (OP) - 07/28/20 0001      Exercises   Exercises Shoulder      Shoulder Exercises: Supine  External Rotation PROM;10 reps    Flexion PROM;10 reps    ABduction PROM;10 reps    Other Supine Exercises bridges x 10      Shoulder Exercises: Seated   Elevation AROM;10 reps    Row AROM;10 reps      Shoulder Exercises: Therapy Ball   Flexion 10 reps    ABduction 10 reps   min vg for technique to use body to move ball versus active left shoulder movement     Shoulder Exercises: Isometric Strengthening   Flexion Supine;3X3"    External Rotation Supine;3X3"   max vg and mod tactile cues to isolate external rotation from abduction during this exercise   ABduction Supine;3X3"      Manual Therapy   Manual Therapy Myofascial release    Manual therapy comments manual therapy completed seperately from all other interventions this date of service    Myofascial Release myofascial release and manual stretching to left upper arm, scapular, shoulder region to decrease pain and restrictions and improve pain free mobility in her left shoulder  per protcol.                  OT Education - 07/28/20 1424    Education Details reviewed hep and encouraged patient that she could complete all exercises completed in clinic at home, except isometrics, which will be provided at next session.    Person(s) Educated Patient    Methods Explanation;Demonstration    Comprehension Verbalized understanding;Returned demonstration            OT Short Term Goals - 07/25/20 1529      OT SHORT TERM GOAL #1   Title Patient will be educated and demonstrate understanding of HEP in order to increase functional use of her LUE during basic daily tasks while increasing ROM and strength.    Time 4    Period Weeks    Status New    Target Date 08/24/20      OT SHORT TERM GOAL #2   Title Pt will increase LUE P/ROM to [facility] to improve ability to complete dressing tasks with minimal compensatory strategies.    Time 4    Period Weeks    Status New      OT SHORT TERM GOAL #3   Title Pt will increase LUE strength to 3+/5 to improve ability to reach items at waist to chest height during ADLs.    Time 4    Period Weeks    Status New             OT Long Term Goals - 07/25/20 1531      OT LONG TERM GOAL #1   Title Pt will decrease LUE pain to 3/10 or less to improve ability to use LUE as assist during ADLs and leisure tasks.    Time 8    Period Weeks    Status New    Target Date 09/23/20      OT LONG TERM GOAL #2   Title Pt will decrease LUE fascial restrictions to minimal amounts to improve mobility required for functional reaching tasks.    Time 8    Period Weeks    Status New      OT LONG TERM GOAL #3   Title Pt will increase LUE A/ROM to [facility] to improve ability to perform functional reaching overhead and behind back during dressing and bathing tasks.    Time 8    Status New      OT  LONG TERM GOAL #4   Title Pt will increase LUE strength to 4+/5 or greater to improve ability to perform housekeeping tasks using LUE as non-dominant.     Time 8    Period Weeks    Status New                 Plan - 07/28/20 1427    Clinical Impression Statement A:  patient able to tolerate manual therapy and passive stretching to 50-60% flexion and abduction.  patient voices compliance with HEP.    Body Structure / Function / Physical Skills ADL;Endurance;UE functional use;Fascial restriction;Muscle spasms;Pain;ROM;IADL;Strength;Edema;Mobility    Plan P:  continue P/ROM to comfortable end range.  continue isometrics with less vg and issue as HEP.           Patient will benefit from skilled therapeutic intervention in order to improve the following deficits and impairments:   Body Structure / Function / Physical Skills: ADL,Endurance,UE functional use,Fascial restriction,Muscle spasms,Pain,ROM,IADL,Strength,Edema,Mobility       Visit Diagnosis: Stiffness of left shoulder, not elsewhere classified  Acute pain of left shoulder  Other symptoms and signs involving the musculoskeletal system    Problem List Patient Active Problem List   Diagnosis Date Noted  . Rotator cuff arthropathy of left shoulder 07/10/2020  . Absolute anemia 10/30/2017  . Constipation 06/10/2017  . Hemorrhoids 06/10/2017  . Cystocele with rectocele 06/10/2017  . Rectocele 06/10/2017  . Diabetic foot ulcer ([place]) 09/27/2016  . CKD (chronic kidney disease), stage III ([place]) 09/27/2016  . 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[place]) 09/27/2016  . Hyperlipidemia 09/27/2016  . Hypertension 09/27/2016  . Diabetic ulcer of foot associated with diabetes mellitus due to underlying condition, limited to breakdown of skin ([place]) 09/27/2016  . Postmenopausal 07/19/2015  . Essential hypertension, benign 08/11/2012  . High cholesterol 08/11/2012  . Family hx of colon cancer 08/11/2012  . Diabetes [facility]) 08/11/2012    Vangie Bicker, H[place], OTR/L [id number]  07/28/2020, 2:34 PM  [place], [place], [address] Phone: [phone]   Fax:  [phone]  Name: Stacy Webb MRN: [id number] Date of Birth: 12-17-1945

## 2020-08-01 ENCOUNTER — Ambulatory Visit: Payer: Medicare Other | Admitting: Orthopedic Surgery

## 2020-08-01 ENCOUNTER — Other Ambulatory Visit (HOSPITAL_COMMUNITY): Payer: Self-pay | Admitting: Nurse Practitioner

## 2020-08-01 ENCOUNTER — Encounter (HOSPITAL_COMMUNITY): Payer: Self-pay

## 2020-08-01 ENCOUNTER — Ambulatory Visit (HOSPITAL_COMMUNITY): Payer: Medicare Other

## 2020-08-01 ENCOUNTER — Other Ambulatory Visit: Payer: Self-pay

## 2020-08-01 ENCOUNTER — Other Ambulatory Visit: Payer: Self-pay | Admitting: Nurse Practitioner

## 2020-08-01 DIAGNOSIS — R634 Abnormal weight loss: Secondary | ICD-10-CM

## 2020-08-01 DIAGNOSIS — R29898 Other symptoms and signs involving the musculoskeletal system: Secondary | ICD-10-CM

## 2020-08-01 DIAGNOSIS — M25612 Stiffness of left shoulder, not elsewhere classified: Secondary | ICD-10-CM | POA: Diagnosis not present

## 2020-08-01 DIAGNOSIS — M25512 Pain in left shoulder: Secondary | ICD-10-CM

## 2020-08-01 DIAGNOSIS — R110 Nausea: Secondary | ICD-10-CM

## 2020-08-01 NOTE — Therapy (Signed)
[place], [place], [address] Phone: [phone]   Fax:  [phone]  Occupational Therapy Treatment  Patient Details  Name: Stacy Webb MRN: [id number] Date of Birth: 04/06/1946 Referring Provider (OT): Dr. Larena Glassman   Encounter Date: 08/01/2020   OT End of Session - 08/01/20 1022    Visit Number 3    Number of Visits 16    Date for OT Re-Evaluation 09/23/20   mini reassess on 08/22/20   Authorization Type UHC Medicare, $30 copay    Authorization Time Period no visit limit    Progress Note Due on Visit 10    OT Start Time [phone]    OT Stop Time 1026    OT Time Calculation (min) 38 min    Activity Tolerance Patient tolerated treatment well    Behavior During Therapy [facility] for tasks assessed/performed           Past Medical History:  Diagnosis Date  . Anemia   . Anxiety   . Arthritis    fingers  . 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[place])   . Diabetes mellitus without complication ([place])   . Excessive hair growth 07/19/2015  . Heart murmur   . History of kidney stones   . Hyperlipidemia   . Hypertension   . Neuromuscular disorder ([place])    diabetic neuropathy  . Neuropathy   . Postmenopausal 07/19/2015    Past Surgical History:  Procedure Laterality Date  . ANTERIOR AND POSTERIOR REPAIR N/A 04/28/2018   Procedure: ANTERIOR (CYSTOCELE) AND POSTERIOR REPAIR (RECTOCELE);  Surgeon: Jonnie Kind, MD;  Location: [facility];  Service: Gynecology;  Laterality: N/A;  . APPENDECTOMY    . CARPAL TUNNEL RELEASE Bilateral 2001  . CESAREAN SECTION    . COLONOSCOPY N/A 08/28/2012   Procedure: COLONOSCOPY;  Surgeon: Rogene Houston, MD;  [facility];  Service: Endoscopy;  Laterality: N/A;  915  . COLONOSCOPY N/A 12/25/2017   Procedure: COLONOSCOPY;  Surgeon: Rogene Houston, MD;  [facility];  Service: Endoscopy;  Laterality: N/A;  12:45  . REVERSE SHOULDER ARTHROPLASTY Left 07/10/2020    Procedure: REVERSE SHOULDER ARTHROPLASTY;  Surgeon: [name], MD;  Location: [facility];  Service: Orthopedics;  Laterality: Left;    There were no vitals filed for this visit.   Subjective Assessment - 08/01/20 0951    Subjective  S: I woke up with soreness this morning.    Currently in Pain? Yes    Pain Score 6     Pain Location Shoulder    Pain Orientation Left    Pain Descriptors / Indicators Aching    Pain Type Surgical pain    Pain Onset In the past 7 days    Pain Frequency Intermittent    Aggravating Factors  woke up with this soreness.    Pain Relieving Factors Hasn't been bad enough to take any medication.    Effect of Pain on Daily Activities Pt is unable to utilize her RUE for any daily tasks.    Multiple Pain Sites No              [facility] OT Assessment - 08/01/20 0953      Assessment   Medical Diagnosis s/p left reverse TSA      Precautions   Precautions Shoulder    Type of Shoulder Precautions See protocol    Shoulder Interventions Shoulder sling/immobilizer;Off for dressing/bathing/exercises  OT Treatments/Exercises (OP) - 08/01/20 1009      Exercises   Exercises Shoulder      Shoulder Exercises: Supine   Protraction PROM;10 reps    Horizontal ABduction PROM;10 reps    External Rotation PROM;10 reps    Internal Rotation PROM;10 reps    Flexion PROM;10 reps    ABduction PROM;10 reps      Shoulder Exercises: Seated   Row AROM;10 reps    Other Seated Exercises scapular depression; A/ROM 10X      Shoulder Exercises: Therapy Ball   Flexion 10 reps   2" hold at end stretch   ABduction 10 reps   2' hold at end stretch     Shoulder Exercises: ROM/Strengthening   Thumb Tacks 1' low level      Shoulder Exercises: Isometric Strengthening   Flexion Supine;3X3"    External Rotation Supine;3X3"    ABduction Supine;3X3"      Manual Therapy   Manual Therapy Myofascial release    Manual therapy comments manual therapy  completed seperately from all other interventions this date of service    Myofascial Release myofascial release and manual stretching to left upper arm, scapular, shoulder region to decrease pain and restrictions and improve pain free mobility in her left shoulder per protcol.                    OT Short Term Goals - 08/01/20 1020      OT SHORT TERM GOAL #1   Title Patient will be educated and demonstrate understanding of HEP in order to increase functional use of her LUE during basic daily tasks while increasing ROM and strength.    Time 4    Period Weeks    Status On-going    Target Date 08/24/20      OT SHORT TERM GOAL #2   Title Pt will increase LUE P/ROM to [facility] to improve ability to complete dressing tasks with minimal compensatory strategies.    Time 4    Period Weeks    Status On-going      OT SHORT TERM GOAL #3   Title Pt will increase LUE strength to 3+/5 to improve ability to reach items at waist to chest height during ADLs.    Time 4    Period Weeks    Status On-going             OT Long Term Goals - 08/01/20 1021      OT LONG TERM GOAL #1   Title Pt will decrease LUE pain to 3/10 or less to improve ability to use LUE as assist during ADLs and leisure tasks.    Time 8    Period Weeks    Status On-going      OT LONG TERM GOAL #2   Title Pt will decrease LUE fascial restrictions to minimal amounts to improve mobility required for functional reaching tasks.    Time 8    Period Weeks    Status On-going      OT LONG TERM GOAL #3   Title Pt will increase LUE A/ROM to [facility] to improve ability to perform functional reaching overhead and behind back during dressing and bathing tasks.    Time 8    Period Weeks    Status On-going      OT LONG TERM GOAL #4   Title Pt will increase LUE strength to 4+/5 or greater to improve ability to perform housekeeping tasks using LUE as non-dominant.  Time 8    Period Weeks    Status On-going                  Plan - 08/01/20 1726    Clinical Impression Statement A: Continued with myofascial release to left upper arm and upper trapezius region to address mmin-mod fascial restrictions. Patient required verbal cues consistantly to decrease muscle guarding and not assist with movement. Able to achieve approximately 50% of passive flexion and abduction. Added horizontal abduction passively. Added thumb tacks standing and scapular depressiong with VC for form and technique provided throughout session.    Body Structure / Function / Physical Skills ADL;Endurance;UE functional use;Fascial restriction;Muscle spasms;Pain;ROM;IADL;Strength;Edema;Mobility    Plan P: Continue to follow protocol. Add pro/ret/elev/dep. Continue to work on form and technique with isometric exercises; primarily flexion and er.    Consulted and Agree with Plan of Care Patient           Patient will benefit from skilled therapeutic intervention in order to improve the following deficits and impairments:   Body Structure / Function / Physical Skills: ADL,Endurance,UE functional use,Fascial restriction,Muscle spasms,Pain,ROM,IADL,Strength,Edema,Mobility       Visit Diagnosis: Acute pain of left shoulder  Stiffness of left shoulder, not elsewhere classified  Other symptoms and signs involving the musculoskeletal system    Problem List Patient Active Problem List   Diagnosis Date Noted  . Rotator cuff arthropathy of left shoulder 07/10/2020  . Absolute anemia 10/30/2017  . Constipation 06/10/2017  . Hemorrhoids 06/10/2017  . Cystocele with rectocele 06/10/2017  . Rectocele 06/10/2017  . Diabetic foot ulcer ([place]) 09/27/2016  . CKD (chronic kidney disease), stage III ([place]) 09/27/2016  . 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[place]) 09/27/2016  . Hyperlipidemia 09/27/2016  . Hypertension 09/27/2016  . Diabetic ulcer of foot associated with diabetes mellitus due to underlying condition, limited to breakdown of  skin ([place]) 09/27/2016  . Postmenopausal 07/19/2015  . Essential hypertension, benign 08/11/2012  . High cholesterol 08/11/2012  . Family hx of colon cancer 08/11/2012  . Diabetes [facility]) 08/11/2012    Ailene Ravel, OTR/L,CBIS  [phone]  08/01/2020, 5:30 PM  [place] [address] [place], [place], [address] Phone: [phone]   Fax:  [phone]  Name: Stacy Webb MRN: [id number] Date of Birth: 1945/12/07

## 2020-08-03 ENCOUNTER — Ambulatory Visit (HOSPITAL_COMMUNITY): Payer: Medicare Other | Admitting: Occupational Therapy

## 2020-08-03 ENCOUNTER — Encounter (HOSPITAL_COMMUNITY): Payer: Self-pay | Admitting: Occupational Therapy

## 2020-08-03 ENCOUNTER — Other Ambulatory Visit: Payer: Self-pay

## 2020-08-03 DIAGNOSIS — R29898 Other symptoms and signs involving the musculoskeletal system: Secondary | ICD-10-CM

## 2020-08-03 DIAGNOSIS — M25612 Stiffness of left shoulder, not elsewhere classified: Secondary | ICD-10-CM | POA: Diagnosis not present

## 2020-08-03 DIAGNOSIS — M25512 Pain in left shoulder: Secondary | ICD-10-CM

## 2020-08-03 NOTE — Therapy (Signed)
[place], [place], [address] Phone: [phone]   Fax:  [phone]  Occupational Therapy Treatment  Patient Details  Name: Stacy Webb MRN: [id number] Date of Birth: [birth year] Referring Provider (OT): Dr. Larena Glassman   Encounter Date: 08/03/2020   OT End of Session - 08/03/20 1020    Visit Number 4    Number of Visits 16    Date for OT Re-Evaluation 09/23/20   mini reassess on 08/22/20   Authorization Type UHC Medicare, $30 copay    Authorization Time Period no visit limit    Progress Note Due on Visit 10    OT Start Time [phone]    OT Stop Time 1025    OT Time Calculation (min) 38 min    Activity Tolerance Patient tolerated treatment well    Behavior During Therapy [facility] for tasks assessed/performed           Past Medical History:  Diagnosis Date  . Anemia   . Anxiety   . Arthritis    fingers  . 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[place])   . Diabetes mellitus without complication ([place])   . Excessive hair growth 07/19/2015  . Heart murmur   . History of kidney stones   . Hyperlipidemia   . Hypertension   . Neuromuscular disorder ([place])    diabetic neuropathy  . Neuropathy   . Postmenopausal 07/19/2015    Past Surgical History:  Procedure Laterality Date  . ANTERIOR AND POSTERIOR REPAIR N/A 04/28/2018   Procedure: ANTERIOR (CYSTOCELE) AND POSTERIOR REPAIR (RECTOCELE);  Surgeon: Jonnie Kind, MD;  Location: [facility];  Service: Gynecology;  Laterality: N/A;  . APPENDECTOMY    . CARPAL TUNNEL RELEASE Bilateral 2001  . CESAREAN SECTION    . COLONOSCOPY N/A 08/28/2012   Procedure: COLONOSCOPY;  Surgeon: Rogene Houston, MD;  Location: [facility];  Service: Endoscopy;  Laterality: N/A;  915  . COLONOSCOPY N/A 12/25/2017   Procedure: COLONOSCOPY;  Surgeon: Rogene Houston, MD;  [facility]: [facility];  Service: Endoscopy;  Laterality: N/A;  12:45  . REVERSE SHOULDER ARTHROPLASTY Left 07/10/2020    Procedure: REVERSE SHOULDER ARTHROPLASTY;  Surgeon: [name], MD;  Location: [facility];  Service: Orthopedics;  Laterality: Left;    There were no vitals filed for this visit.   Subjective Assessment - 08/03/20 0946    Subjective  S: It comes and goes.    Currently in Pain? Yes    Pain Score 7     Pain Location Shoulder    Pain Orientation Left    Pain Descriptors / Indicators Sore    Pain Type Acute pain    Pain Radiating Towards none    Pain Onset In the past 7 days    Pain Frequency Intermittent    Aggravating Factors  unsure    Pain Relieving Factors occasional tylenol    Effect of Pain on Daily Activities unable to use LUE for ADLs    Multiple Pain Sites No              [facility] OT Assessment - 08/03/20 0945      Assessment   Medical Diagnosis s/p left reverse TSA      Precautions   Precautions Shoulder    Type of Shoulder Precautions See protocol    Shoulder Interventions Shoulder sling/immobilizer;Off for dressing/bathing/exercises  OT Treatments/Exercises (OP) - 08/03/20 0945      Exercises   Exercises Shoulder      Shoulder Exercises: Supine   Protraction PROM;10 reps    Horizontal ABduction PROM;10 reps    External Rotation PROM;10 reps    Internal Rotation PROM;10 reps    Flexion PROM;10 reps    ABduction PROM;10 reps      Shoulder Exercises: Seated   Row AROM;12 reps    Other Seated Exercises scapular depression; A/ROM 12X      Shoulder Exercises: Therapy Ball   Flexion 10 reps   2" hold at end stretch   ABduction 10 reps   2" hold at end stretch     Shoulder Exercises: ROM/Strengthening   Thumb Tacks 1' low level      Shoulder Exercises: Isometric Strengthening   Flexion Supine;3X3"    External Rotation Supine;3X3"    ABduction Supine;3X3"      Manual Therapy   Manual Therapy Myofascial release    Manual therapy comments manual therapy completed seperately from all other interventions this date of service     Myofascial Release myofascial release and manual stretching to left upper arm, scapular, shoulder region to decrease pain and restrictions and improve pain free mobility in her left shoulder per protcol.                    OT Short Term Goals - 08/01/20 1020      OT SHORT TERM GOAL #1   Title Patient will be educated and demonstrate understanding of HEP in order to increase functional use of her LUE during basic daily tasks while increasing ROM and strength.    Time 4    Period Weeks    Status On-going    Target Date 08/24/20      OT SHORT TERM GOAL #2   Title Pt will increase LUE P/ROM to [facility] to improve ability to complete dressing tasks with minimal compensatory strategies.    Time 4    Period Weeks    Status On-going      OT SHORT TERM GOAL #3   Title Pt will increase LUE strength to 3+/5 to improve ability to reach items at waist to chest height during ADLs.    Time 4    Period Weeks    Status On-going             OT Long Term Goals - 08/01/20 1021      OT LONG TERM GOAL #1   Title Pt will decrease LUE pain to 3/10 or less to improve ability to use LUE as assist during ADLs and leisure tasks.    Time 8    Period Weeks    Status On-going      OT LONG TERM GOAL #2   Title Pt will decrease LUE fascial restrictions to minimal amounts to improve mobility required for functional reaching tasks.    Time 8    Period Weeks    Status On-going      OT LONG TERM GOAL #3   Title Pt will increase LUE A/ROM to [facility] to improve ability to perform functional reaching overhead and behind back during dressing and bathing tasks.    Time 8    Period Weeks    Status On-going      OT LONG TERM GOAL #4   Title Pt will increase LUE strength to 4+/5 or greater to improve ability to perform housekeeping tasks using LUE as non-dominant.  Time 8    Period Weeks    Status On-going                 Plan - 08/03/20 1015    Clinical Impression Statement A: Pt reports  soreness this morning. Also reports she is going to try and complete 12 reps of each HEP task versus only 10 this week. Continued with myofascial release to LUE to address fascial restrictions. Pt able to tolerate slightly greater than 50% ROM during passive stretching today, cuing for relaxing LUE. Continued with isometrics working on form for flexion and er. Verbal cuing for form and technique.    Body Structure / Function / Physical Skills ADL;Endurance;UE functional use;Fascial restriction;Muscle spasms;Pain;ROM;IADL;Strength;Edema;Mobility    Plan P: Add pro/ret/elev/dep, continue working to increase ROM with passive stretching    OT Home Exercise Plan eval: table slides    Consulted and Agree with Plan of Care Patient           Patient will benefit from skilled therapeutic intervention in order to improve the following deficits and impairments:   Body Structure / Function / Physical Skills: ADL,Endurance,UE functional use,Fascial restriction,Muscle spasms,Pain,ROM,IADL,Strength,Edema,Mobility       Visit Diagnosis: Acute pain of left shoulder  Stiffness of left shoulder, not elsewhere classified  Other symptoms and signs involving the musculoskeletal system    Problem List Patient Active Problem List   Diagnosis Date Noted  . Rotator cuff arthropathy of left shoulder 07/10/2020  . Absolute anemia 10/30/2017  . Constipation 06/10/2017  . Hemorrhoids 06/10/2017  . Cystocele with rectocele 06/10/2017  . Rectocele 06/10/2017  . Diabetic foot ulcer ([place]) 09/27/2016  . CKD (chronic kidney disease), stage III ([place]) 09/27/2016  . 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[place]) 09/27/2016  . Hyperlipidemia 09/27/2016  . Hypertension 09/27/2016  . Diabetic ulcer of foot associated with diabetes mellitus due to underlying condition, limited to breakdown of skin ([name]) 09/27/2016  . Postmenopausal 07/19/2015  . Essential hypertension, benign 08/11/2012  . High cholesterol 08/11/2012   . Family hx of colon cancer 08/11/2012  . Diabetes [facility]) 08/11/2012   Guadelupe Sabin, OTR/L  [phone] 08/03/2020, 10:25 AM  [place], [place], [address] Phone: [phone]   Fax:  [phone]  Name: Stacy Webb MRN: [id number] Date of Birth: 1945/09/28

## 2020-08-07 ENCOUNTER — Ambulatory Visit (HOSPITAL_COMMUNITY)
Admission: RE | Admit: 2020-08-07 | Discharge: 2020-08-07 | Disposition: A | Discharge status: Home / Self Care | Payer: Medicare Other | Source: Ambulatory Visit | Attending: Nurse Practitioner | Admitting: Nurse Practitioner

## 2020-08-07 DIAGNOSIS — R634 Abnormal weight loss: Secondary | ICD-10-CM | POA: Diagnosis present

## 2020-08-07 DIAGNOSIS — R110 Nausea: Secondary | ICD-10-CM | POA: Diagnosis present

## 2020-08-08 ENCOUNTER — Encounter (HOSPITAL_COMMUNITY): Payer: Self-pay | Admitting: Occupational Therapy

## 2020-08-08 ENCOUNTER — Other Ambulatory Visit: Payer: Self-pay

## 2020-08-08 ENCOUNTER — Ambulatory Visit (HOSPITAL_COMMUNITY): Payer: Medicare Other | Attending: Orthopedic Surgery | Admitting: Occupational Therapy

## 2020-08-08 DIAGNOSIS — R29898 Other symptoms and signs involving the musculoskeletal system: Secondary | ICD-10-CM

## 2020-08-08 DIAGNOSIS — M25612 Stiffness of left shoulder, not elsewhere classified: Secondary | ICD-10-CM

## 2020-08-08 DIAGNOSIS — M25512 Pain in left shoulder: Secondary | ICD-10-CM | POA: Diagnosis present

## 2020-08-08 NOTE — Therapy (Signed)
[facility] [address] [place], [place], [address] Phone: [phone]   Fax:  [phone]  Occupational Therapy Treatment  Patient Details  Name: Stacy Webb MRN: [id number] Date of Birth: 1945/04/19 Referring Provider (OT): Dr. Larena Glassman   Encounter Date: 08/08/2020   OT End of Session - 08/08/20 1025    Visit Number 5    Number of Visits 16    Date for OT Re-Evaluation 09/23/20   mini reassess on 08/22/20   Authorization Type [employer] Medicare, $30 copay    Authorization Time Period no visit limit    Progress Note Due on Visit 10    OT Start Time 0945    OT Stop Time 1024    OT Time Calculation (min) 39 min    Activity Tolerance Patient tolerated treatment well    Behavior During Therapy [facility] for tasks assessed/performed           Past Medical History:  Diagnosis Date  . Anemia   . Anxiety   . Arthritis    fingers  . 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[place])   . Diabetes mellitus without complication ([place])   . Excessive hair growth 07/19/2015  . Heart murmur   . History of kidney stones   . Hyperlipidemia   . Hypertension   . Neuromuscular disorder ([place])    diabetic neuropathy  . Neuropathy   . Postmenopausal 07/19/2015    Past Surgical History:  Procedure Laterality Date  . ANTERIOR AND POSTERIOR REPAIR N/A 04/28/2018   Procedure: ANTERIOR (CYSTOCELE) AND POSTERIOR REPAIR (RECTOCELE);  Surgeon: Jonnie Kind, MD;  Location: [facility];  Service: Gynecology;  Laterality: N/A;  . APPENDECTOMY    . CARPAL TUNNEL RELEASE Bilateral 2001  . CESAREAN SECTION    . COLONOSCOPY N/A 08/28/2012   Procedure: COLONOSCOPY;  Surgeon: Rogene Houston, MD;  Location: [facility];  Service: Endoscopy;  Laterality: N/A;  915  . COLONOSCOPY N/A 12/25/2017   Procedure: COLONOSCOPY;  Surgeon: Rogene Houston, MD;  Location: [facility];  Service: Endoscopy;  Laterality: N/A;  12:45  . REVERSE SHOULDER ARTHROPLASTY Left 07/10/2020    Procedure: REVERSE SHOULDER ARTHROPLASTY;  Surgeon: [name], MD;  Location: [facility];  Service: Orthopedics;  Laterality: Left;    There were no vitals filed for this visit.   Subjective Assessment - 08/08/20 0936    Subjective  S: I'm doing 15 of my exercises 2 times a day now.   Currently in Pain? Yes    Pain Score 2     Pain Location Shoulder    Pain Orientation Left    Pain Descriptors / Indicators Sore    Pain Type Acute pain    Pain Radiating Towards None    Pain Onset In the past 7 days    Pain Frequency Intermittent    Aggravating Factors  exercise    Pain Relieving Factors occasionaly tylenol    Effect of Pain on Daily Activities unable to use LUE for ADLs    Multiple Pain Sites No              [facility] OT Assessment - 08/08/20 0936      Assessment   Medical Diagnosis s/p left reverse TSA      Precautions   Precautions Shoulder    Type of Shoulder Precautions See protocol    Shoulder Interventions Shoulder sling/immobilizer;Off for dressing/bathing/exercises  OT Treatments/Exercises (OP) - 08/08/20 0949      Exercises   Exercises Shoulder      Shoulder Exercises: Supine   Protraction PROM;5 reps;AAROM;10 reps    Horizontal ABduction PROM;5 reps;AAROM;10 reps    External Rotation PROM;5 reps;AAROM;10 reps    Internal Rotation PROM;5 reps;AAROM;10 reps    Flexion PROM;5 reps;AAROM;10 reps    ABduction PROM;5 reps;AAROM;10 reps    ABduction Limitations min assist for unweighted arm      Shoulder Exercises: ROM/Strengthening   Prot/Ret//Elev/Dep 1'      Manual Therapy   Manual Therapy Myofascial release    Manual therapy comments manual therapy completed seperately from all other interventions this date of service    Myofascial Release myofascial release and manual stretching to left upper arm, scapular, shoulder region to decrease pain and restrictions and improve pain free mobility in her left shoulder per protcol.                     OT Short Term Goals - 08/01/20 1020      OT SHORT TERM GOAL #1   Title Patient will be educated and demonstrate understanding of HEP in order to increase functional use of her LUE during basic daily tasks while increasing ROM and strength.    Time 4    Period Weeks    Status On-going    Target Date 08/24/20      OT SHORT TERM GOAL #2   Title Pt will increase LUE P/ROM to [facility] to improve ability to complete dressing tasks with minimal compensatory strategies.    Time 4    Period Weeks    Status On-going      OT SHORT TERM GOAL #3   Title Pt will increase LUE strength to 3+/5 to improve ability to reach items at waist to chest height during ADLs.    Time 4    Period Weeks    Status On-going             OT Long Term Goals - 08/01/20 1021      OT LONG TERM GOAL #1   Title Pt will decrease LUE pain to 3/10 or less to improve ability to use LUE as assist during ADLs and leisure tasks.    Time 8    Period Weeks    Status On-going      OT LONG TERM GOAL #2   Title Pt will decrease LUE fascial restrictions to minimal amounts to improve mobility required for functional reaching tasks.    Time 8    Period Weeks    Status On-going      OT LONG TERM GOAL #3   Title Pt will increase LUE A/ROM to [facility] to improve ability to perform functional reaching overhead and behind back during dressing and bathing tasks.    Time 8    Period Weeks    Status On-going      OT LONG TERM GOAL #4   Title Pt will increase LUE strength to 4+/5 or greater to improve ability to perform housekeeping tasks using LUE as non-dominant.    Time 8    Period Weeks    Status On-going                 Plan - 08/08/20 1013    Clinical Impression Statement A: Continued with myofascial release to address fascial restrictions. Continued with passive stretching, progressed to AA/ROM per protocol, pt completing in supine and taking rest  breaks as needed for fatigue. Added  prot/ret/elev/dep, tactile cuing for completion. Verbal cuing for form and technique during exercises.    Body Structure / Function / Physical Skills ADL;Endurance;UE functional use;Fascial restriction;Muscle spasms;Pain;ROM;IADL;Strength;Edema;Mobility    Plan P: Continue with AA/ROM and progress to standing as able, update HEP when appropriate.    OT Home Exercise Plan eval: table slides    Consulted and Agree with Plan of Care Patient           Patient will benefit from skilled therapeutic intervention in order to improve the following deficits and impairments:   Body Structure / Function / Physical Skills: ADL,Endurance,UE functional use,Fascial restriction,Muscle spasms,Pain,ROM,IADL,Strength,Edema,Mobility       Visit Diagnosis: Acute pain of left shoulder  Stiffness of left shoulder, not elsewhere classified  Other symptoms and signs involving the musculoskeletal system    Problem List Patient Active Problem List   Diagnosis Date Noted  . Rotator cuff arthropathy of left shoulder 07/10/2020  . Absolute anemia 10/30/2017  . Constipation 06/10/2017  . Hemorrhoids 06/10/2017  . Cystocele with rectocele 06/10/2017  . Rectocele 06/10/2017  . Diabetic foot ulcer ([place]) 09/27/2016  . CKD (chronic kidney disease), stage III ([place]) 09/27/2016  . 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[place]) 09/27/2016  . Hyperlipidemia 09/27/2016  . Hypertension 09/27/2016  . Diabetic ulcer of foot associated with diabetes mellitus due to underlying condition, limited to breakdown of skin ([place]) 09/27/2016  . Postmenopausal 07/19/2015  . Essential hypertension, benign 08/11/2012  . High cholesterol 08/11/2012  . Family hx of colon cancer 08/11/2012  . Diabetes [facility]) 08/11/2012   Guadelupe Sabin, OTR/L  [phone] 08/08/2020, 10:27 AM  [facility] [address] [place], [place], [address] Phone: [phone]   Fax:  [phone]  Name: Stacy Webb MRN: [id number] Date of Birth: 1945/06/28

## 2020-08-10 ENCOUNTER — Other Ambulatory Visit: Payer: Self-pay

## 2020-08-10 ENCOUNTER — Ambulatory Visit (HOSPITAL_COMMUNITY): Payer: Medicare Other | Admitting: Occupational Therapy

## 2020-08-10 ENCOUNTER — Encounter (HOSPITAL_COMMUNITY): Payer: Self-pay | Admitting: Occupational Therapy

## 2020-08-10 DIAGNOSIS — M25512 Pain in left shoulder: Secondary | ICD-10-CM

## 2020-08-10 DIAGNOSIS — R29898 Other symptoms and signs involving the musculoskeletal system: Secondary | ICD-10-CM

## 2020-08-10 DIAGNOSIS — M25612 Stiffness of left shoulder, not elsewhere classified: Secondary | ICD-10-CM

## 2020-08-10 NOTE — Therapy (Signed)
[facility] [place], [place], [address] Phone: [phone]   Fax:  [phone]  Occupational Therapy Treatment  Patient Details  Name: Stacy Webb MRN: [id number] Date of Birth: [birth year] Referring Provider (OT): Dr. Larena Glassman   Encounter Date: 08/10/2020   OT End of Session - 08/10/20 1111    Visit Number 6    Number of Visits 16    Date for OT Re-Evaluation 09/23/20   mini reassess on 08/22/20   Authorization Type [employer] Medicare, $30 copay    Authorization Time Period no visit limit    Progress Note Due on Visit 10    OT Start Time [phone]    OT Stop Time 1028    OT Time Calculation (min) 41 min    Activity Tolerance Patient tolerated treatment well    Behavior During Therapy [facility] for tasks assessed/performed           Past Medical History:  Diagnosis Date  . Anemia   . Anxiety   . Arthritis    fingers  . 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[place])   . Diabetes mellitus without complication ([place])   . Excessive hair growth 07/19/2015  . Heart murmur   . History of kidney stones   . Hyperlipidemia   . Hypertension   . Neuromuscular disorder ([place])    diabetic neuropathy  . Neuropathy   . Postmenopausal 07/19/2015    Past Surgical History:  Procedure Laterality Date  . ANTERIOR AND POSTERIOR REPAIR N/A 04/28/2018   Procedure: ANTERIOR (CYSTOCELE) AND POSTERIOR REPAIR (RECTOCELE);  Surgeon: Jonnie Kind, MD;  Location: [facility];  Service: Gynecology;  Laterality: N/A;  . APPENDECTOMY    . CARPAL TUNNEL RELEASE Bilateral 2001  . CESAREAN SECTION    . COLONOSCOPY N/A 08/28/2012   Procedure: COLONOSCOPY;  Surgeon: Rogene Houston, MD;  [facility];  Service: Endoscopy;  Laterality: N/A;  915  . COLONOSCOPY N/A 12/25/2017   Procedure: COLONOSCOPY;  Surgeon: Rogene Houston, MD;  [facility];  Service: Endoscopy;  Laterality: N/A;  12:45  . REVERSE SHOULDER ARTHROPLASTY Left 07/10/2020    Procedure: REVERSE SHOULDER ARTHROPLASTY;  Surgeon: [name], MD;  Location: [facility];  Service: Orthopedics;  Laterality: Left;    There were no vitals filed for this visit.   Subjective Assessment - 08/10/20 0946    Subjective  S: It's feeling better.    Currently in Pain? No/denies              [facility] OT Assessment - 08/10/20 0945      Assessment   Medical Diagnosis s/p left reverse TSA      Precautions   Precautions Shoulder    Type of Shoulder Precautions See protocol    Shoulder Interventions Shoulder sling/immobilizer;Off for dressing/bathing/exercises                    OT Treatments/Exercises (OP) - 08/10/20 0949      Exercises   Exercises Shoulder      Shoulder Exercises: Supine   Protraction PROM;5 reps;AAROM;10 reps    Horizontal ABduction PROM;5 reps;AAROM;10 reps    External Rotation PROM;5 reps;AAROM;10 reps    Internal Rotation PROM;5 reps;AAROM;10 reps    Flexion PROM;5 reps;AAROM;10 reps    ABduction PROM;5 reps;AAROM;10 reps      Shoulder Exercises: Standing   Protraction AAROM;10 reps    External Rotation AAROM;10 reps    Internal  Rotation AAROM;10 reps    Flexion AAROM;10 reps      Shoulder Exercises: Pulleys   Flexion 1 minute    ABduction 1 minute      Shoulder Exercises: ROM/Strengthening   Wall Wash 1' low level    Thumb Tacks 1' low level      Manual Therapy   Manual Therapy Myofascial release    Manual therapy comments manual therapy completed seperately from all other interventions this date of service    Myofascial Release myofascial release and manual stretching to left upper arm, scapular, shoulder region to decrease pain and restrictions and improve pain free mobility in her left shoulder per protcol.                  OT Education - 08/10/20 1010    Education Details AA/ROM in supine    Person(s) Educated Patient    Methods Explanation;Demonstration;Handout    Comprehension Verbalized  understanding;Returned demonstration            OT Short Term Goals - 08/01/20 1020      OT SHORT TERM GOAL #1   Title Patient will be educated and demonstrate understanding of HEP in order to increase functional use of her LUE during basic daily tasks while increasing ROM and strength.    Time 4    Period Weeks    Status On-going    Target Date 08/24/20      OT SHORT TERM GOAL #2   Title Pt will increase LUE P/ROM to [facility] to improve ability to complete dressing tasks with minimal compensatory strategies.    Time 4    Period Weeks    Status On-going      OT SHORT TERM GOAL #3   Title Pt will increase LUE strength to 3+/5 to improve ability to reach items at waist to chest height during ADLs.    Time 4    Period Weeks    Status On-going             OT Long Term Goals - 08/01/20 1021      OT LONG TERM GOAL #1   Title Pt will decrease LUE pain to 3/10 or less to improve ability to use LUE as assist during ADLs and leisure tasks.    Time 8    Period Weeks    Status On-going      OT LONG TERM GOAL #2   Title Pt will decrease LUE fascial restrictions to minimal amounts to improve mobility required for functional reaching tasks.    Time 8    Period Weeks    Status On-going      OT LONG TERM GOAL #3   Title Pt will increase LUE A/ROM to [facility] to improve ability to perform functional reaching overhead and behind back during dressing and bathing tasks.    Time 8    Period Weeks    Status On-going      OT LONG TERM GOAL #4   Title Pt will increase LUE strength to 4+/5 or greater to improve ability to perform housekeeping tasks using LUE as non-dominant.    Time 8    Period Weeks    Status On-going                 Plan - 08/10/20 1006    Clinical Impression Statement A: Continued with myofascial release to address fascial restrictions, passive stretching. Continued with AA/ROM in supine, rest breaks provided as needed for fatigue. Added  AA/ROM protraction,  flexion, and er/IR in sitting, pt requiring cuing for posture and technique. Also added wall wash and pulleys. Verbal cuing for form and technique. HEP updated for AA/ROM in supine.    Body Structure / Function / Physical Skills ADL;Endurance;UE functional use;Fascial restriction;Muscle spasms;Pain;ROM;IADL;Strength;Edema;Mobility    Plan P: Follow up on HEP, continue with AA/ROM working towards improved ROM and activity tolerance    OT Home Exercise Plan eval: table slides    Consulted and Agree with Plan of Care Patient           Patient will benefit from skilled therapeutic intervention in order to improve the following deficits and impairments:   Body Structure / Function / Physical Skills: ADL,Endurance,UE functional use,Fascial restriction,Muscle spasms,Pain,ROM,IADL,Strength,Edema,Mobility       Visit Diagnosis: Acute pain of left shoulder  Stiffness of left shoulder, not elsewhere classified  Other symptoms and signs involving the musculoskeletal system    Problem List Patient Active Problem List   Diagnosis Date Noted  . Rotator cuff arthropathy of left shoulder 07/10/2020  . Absolute anemia 10/30/2017  . Constipation 06/10/2017  . Hemorrhoids 06/10/2017  . Cystocele with rectocele 06/10/2017  . Rectocele 06/10/2017  . Diabetic foot ulcer ([place]) 09/27/2016  . CKD (chronic kidney disease), stage III ([place]) 09/27/2016  . 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[place]) 09/27/2016  . Hyperlipidemia 09/27/2016  . Hypertension 09/27/2016  . Diabetic ulcer of foot associated with diabetes mellitus due to underlying condition, limited to breakdown of skin ([place]) 09/27/2016  . Postmenopausal 07/19/2015  . Essential hypertension, benign 08/11/2012  . High cholesterol 08/11/2012  . Family hx of colon cancer 08/11/2012  . Diabetes [facility]) 08/11/2012   Guadelupe Sabin, OTR/L  [phone] 08/10/2020, 11:11 AM  [facility] [place], [place], [address] Phone: [phone]   Fax:  [phone]  Name: Stacy Webb MRN: [id number] Date of Birth: 06-01-1945

## 2020-08-10 NOTE — Patient Instructions (Signed)

## 2020-08-15 ENCOUNTER — Ambulatory Visit (HOSPITAL_COMMUNITY): Payer: Medicare Other | Admitting: Specialist

## 2020-08-15 ENCOUNTER — Other Ambulatory Visit: Payer: Self-pay

## 2020-08-15 ENCOUNTER — Encounter (HOSPITAL_COMMUNITY): Payer: Self-pay | Admitting: Specialist

## 2020-08-15 DIAGNOSIS — M25512 Pain in left shoulder: Secondary | ICD-10-CM | POA: Diagnosis not present

## 2020-08-15 DIAGNOSIS — R29898 Other symptoms and signs involving the musculoskeletal system: Secondary | ICD-10-CM

## 2020-08-15 DIAGNOSIS — M25612 Stiffness of left shoulder, not elsewhere classified: Secondary | ICD-10-CM

## 2020-08-15 NOTE — Therapy (Signed)
[facility] [address] [place], [place], [address] Phone: [phone]   Fax:  [phone]  Occupational Therapy Treatment Progress Note Reporting Period 07/25/20 to 08/15/20  See note below for Objective Data and Assessment of Progress/Goals.   AROM  Overall AROM Comments assessed in supine, external and internal rotation with shoulder adducted   AROM Assessment Site Shoulder   Right/Left Shoulder Left   Left Shoulder Flexion 100 Degrees   Left Shoulder ABduction 7 Degrees   Left Shoulder Internal Rotation 90 Degrees   Left Shoulder External Rotation 30 Degrees     PROM  Left Shoulder Flexion 115 Degrees   105  Left Shoulder ABduction 135 Degrees   94  Left Shoulder Internal Rotation 90 Degrees   90  Left Shoulder External Rotation 40 Degrees   20     Patient Details  Name: Stacy Webb MRN: [id number] Date of Birth: [birth year] Referring Provider (OT): Dr. Larena Glassman   Encounter Date: 08/15/2020   OT End of Session - 08/15/20 1159    Visit Number 7    Number of Visits 16    Date for OT Re-Evaluation 09/23/20    Authorization Type [employer] Medicare, $30 copay    Authorization Time Period no visit limit    Progress Note Due on Visit 17    OT Start Time 0949    OT Stop Time 1030    OT Time Calculation (min) 41 min    Activity Tolerance Patient tolerated treatment well    Behavior During Therapy [facility] for tasks assessed/performed           Past Medical History:  Diagnosis Date  . Anemia   . Anxiety   . Arthritis    fingers  . 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[place])   . Diabetes mellitus without complication ([place])   . Excessive hair growth 07/19/2015  . Heart murmur   . History of kidney stones   . Hyperlipidemia   . Hypertension   . Neuromuscular disorder ([place])    diabetic neuropathy  . Neuropathy   . Postmenopausal 07/19/2015    Past Surgical History:  Procedure Laterality Date  . ANTERIOR AND POSTERIOR  REPAIR N/A 04/28/2018   Procedure: ANTERIOR (CYSTOCELE) AND POSTERIOR REPAIR (RECTOCELE);  Surgeon: Jonnie Kind, MD;  Location: [facility];  Service: Gynecology;  Laterality: N/A;  . APPENDECTOMY    . CARPAL TUNNEL RELEASE Bilateral 2001  . CESAREAN SECTION    . COLONOSCOPY N/A 08/28/2012   Procedure: COLONOSCOPY;  Surgeon: Rogene Houston, MD;  Location: [facility];  Service: Endoscopy;  Laterality: N/A;  915  . COLONOSCOPY N/A 12/25/2017   Procedure: COLONOSCOPY;  Surgeon: Rogene Houston, MD;  Location: [facility];  Service: Endoscopy;  Laterality: N/A;  12:45  . REVERSE SHOULDER ARTHROPLASTY Left 07/10/2020   Procedure: REVERSE SHOULDER ARTHROPLASTY;  Surgeon: [name], MD;  Location: [facility];  Service: Orthopedics;  Laterality: Left;    There were no vitals filed for this visit.   Subjective Assessment - 08/15/20 1158    Subjective  S:  I feel like I can do a bit more now.  I can pull the covers up easier, and I can wash dishes.    Currently in Pain? Yes    Pain Score 1     Pain Location Shoulder    Pain Orientation Left    Pain Descriptors / Indicators Aching  [facility] OT Assessment - 08/15/20 0001      Assessment   Medical Diagnosis s/p left reverse TSA    Referring Provider (OT) Dr. Larena Glassman    Onset Date/Surgical Date 07/10/20    Hand Dominance Right    Next MD Visit 08/16/2020      Precautions   Precautions Shoulder    Type of Shoulder Precautions See protocol    Shoulder Interventions Shoulder sling/immobilizer;Off for dressing/bathing/exercises      ADL   ADL comments increased ease reaching to mid trunk height, pull covers up, and wash dishes.      Palpation   Palpation comment mod - max fascial restrictions in left shoulder region      AROM   Overall AROM Comments assessed in supine, external and internal rotation with shoulder adducted    AROM Assessment Site Shoulder    Right/Left Shoulder Left    Left Shoulder Flexion 100 Degrees     Left Shoulder ABduction 7 Degrees    Left Shoulder Internal Rotation 90 Degrees    Left Shoulder External Rotation 30 Degrees      PROM   Left Shoulder Flexion 115 Degrees   105   Left Shoulder ABduction 135 Degrees   94   Left Shoulder Internal Rotation 90 Degrees   90   Left Shoulder External Rotation 40 Degrees   20                   OT Treatments/Exercises (OP) - 08/15/20 0001      Exercises   Exercises Shoulder      Shoulder Exercises: Supine   Protraction PROM;5 reps;AAROM;12 reps    Horizontal ABduction PROM;5 reps;AAROM;12 reps    External Rotation PROM;5 reps;AAROM;12 reps    Internal Rotation PROM;5 reps;AAROM;12 reps    Flexion PROM;5 reps;AAROM;12 reps    ABduction PROM;5 reps;AAROM;12 reps    Other Supine Exercises serratus anterior punch 10 times      Shoulder Exercises: Standing   Protraction AAROM;10 reps    External Rotation AAROM;10 reps    Internal Rotation AAROM;10 reps    Flexion AAROM;10 reps    ABduction AAROM;10 reps    Extension AROM;10 reps    Row AROM;10 reps    Retraction AROM;10 reps      Manual Therapy   Manual Therapy Myofascial release    Manual therapy comments manual therapy completed seperately from all other interventions this date of service    Myofascial Release myofascial release and manual stretching to left upper arm, scapular, shoulder region to decrease pain and restrictions and improve pain free mobility in her left shoulder per protcol.                  OT Education - 08/15/20 1158    Education Details reviewed progress towards goals    Person(s) Educated Patient    Methods Explanation    Comprehension Verbalized understanding            OT Short Term Goals - 08/15/20 1203      OT SHORT TERM GOAL #1   Title Patient will be educated and demonstrate understanding of HEP in order to increase functional use of her LUE during basic daily tasks while increasing ROM and strength.    Time 4    Period  Weeks    Status On-going    Target Date 08/24/20      OT SHORT TERM GOAL #2   Title Pt will increase LUE P/ROM  to [facility] to improve ability to complete dressing tasks with minimal compensatory strategies.    Time 4    Period Weeks    Status On-going      OT SHORT TERM GOAL #3   Title Pt will increase LUE strength to 3+/5 to improve ability to reach items at waist to chest height during ADLs.    Time 4    Period Weeks    Status On-going             OT Long Term Goals - 08/01/20 1021      OT LONG TERM GOAL #1   Title Pt will decrease LUE pain to 3/10 or less to improve ability to use LUE as assist during ADLs and leisure tasks.    Time 8    Period Weeks    Status On-going      OT LONG TERM GOAL #2   Title Pt will decrease LUE fascial restrictions to minimal amounts to improve mobility required for functional reaching tasks.    Time 8    Period Weeks    Status On-going      OT LONG TERM GOAL #3   Title Pt will increase LUE A/ROM to [facility] to improve ability to perform functional reaching overhead and behind back during dressing and bathing tasks.    Time 8    Period Weeks    Status On-going      OT LONG TERM GOAL #4   Title Pt will increase LUE strength to 4+/5 or greater to improve ability to perform housekeeping tasks using LUE as non-dominant.    Time 8    Period Weeks    Status On-going                 Plan - 08/15/20 1159    Clinical Impression Statement A:  Patient is making excellent progress with rom and functional tasks.  she has improved her p/rom in supine in all ranges and a/rom was assessed for the first time today.  she is able to use her arm with increased ease for activities such as pulling the covers up on her bed, washing dishes, and reaching to mid trunk area.  Paitent will benefit from continued skilled OT intervention to improve rom and strength for improved use of LUE with functional activities, per protocol.    OT Frequency 2x / week    OT  Duration 8 weeks    OT Treatment/Interventions Self-care/ADL training;Ultrasound;DME and/or AE instruction;Patient/family education;Passive range of motion;Cryotherapy;Electrical Stimulation;Splinting;Functional Mobility Training;Moist Heat;Therapeutic exercise;Manual Therapy;Therapeutic activities    Plan P:  add aa/rom to HEP, attempt pulleys and ball circles for improved mobility.  continue skilled OT intervention, following MD protocol to improve LUE use to PLOF with all daily tasks.    Consulted and Agree with Plan of Care Patient           Patient will benefit from skilled therapeutic intervention in order to improve the following deficits and impairments:           Visit Diagnosis: Acute pain of left shoulder  Stiffness of left shoulder, not elsewhere classified  Other symptoms and signs involving the musculoskeletal system    Problem List Patient Active Problem List   Diagnosis Date Noted  . Rotator cuff arthropathy of left shoulder 07/10/2020  . Absolute anemia 10/30/2017  . Constipation 06/10/2017  . Hemorrhoids 06/10/2017  . Cystocele with rectocele 06/10/2017  . Rectocele 06/10/2017  . Diabetic foot ulcer (HCC) 09/27/2016  .  CKD (chronic kidney disease), stage III ([place]) 09/27/2016  . 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[place]) 09/27/2016  . Hyperlipidemia 09/27/2016  . Hypertension 09/27/2016  . Diabetic ulcer of foot associated with diabetes mellitus due to underlying condition, limited to breakdown of skin ([place]) 09/27/2016  . Postmenopausal 07/19/2015  . Essential hypertension, benign 08/11/2012  . High cholesterol 08/11/2012  . Family hx of colon cancer 08/11/2012  . Diabetes [facility]) 08/11/2012    Vangie Bicker, [place], OTR/L [phone]  08/15/2020, 12:04 PM  [facility] [place], [place], [address] Phone: [phone]   Fax:  [phone]  Name: Stacy Webb MRN: [id number] Date of Birth:  Nov 01, 1945

## 2020-08-16 ENCOUNTER — Ambulatory Visit (INDEPENDENT_AMBULATORY_CARE_PROVIDER_SITE_OTHER): Payer: Medicare Other | Admitting: Orthopedic Surgery

## 2020-08-16 ENCOUNTER — Encounter: Payer: Self-pay | Admitting: Orthopedic Surgery

## 2020-08-16 ENCOUNTER — Ambulatory Visit: Payer: Medicare Other

## 2020-08-16 VITALS — Ht 66.0 in | Wt 142.0 lb

## 2020-08-16 DIAGNOSIS — Z96612 Presence of left artificial shoulder joint: Secondary | ICD-10-CM | POA: Diagnosis not present

## 2020-08-16 DIAGNOSIS — M12812 Other specific arthropathies, not elsewhere classified, left shoulder: Secondary | ICD-10-CM

## 2020-08-16 NOTE — Progress Notes (Signed)
Orthopaedic Postop Note  Assessment: Stacy Webb is a 75 y.o. female s/p Left Reverse Shoulder Arthroplasty  DOS: 07/10/20  Plan: Progressing well following surgery. Continue to follow the protocol as outlined with physical therapy. She can discontinue the sling. Tylenol as needed. Contact the clinic with questions. Follow-up in 6 weeks   Follow-up: Return in about 6 weeks (around 09/27/2020).  XR at next visit: Left shoulder  Subjective:  Chief Complaint  Patient presents with  . Routine Post Op    S/P Lt shoulder reverse arthroplasty 07/10/20    History of Present Illness: Stacy Webb is a 75 y.o. female who presents following the above stated procedure.  She continues to improve.  She is now 6 weeks out from surgery.  No issues with her surgical incisions.  She takes Tylenol occasionally for pain.  Working well with physical therapy.  She is pleased with the progress so far.  Review of Systems: No fevers or chills No numbness or tingling No Chest Pain No shortness of breath    Objective: Ht 5\' 6"  (1.676 m)   Wt 142 lb (64.4 kg)   BMI 22.92 kg/m   Physical Exam:  Alert and oriented.  No acute distress  Left shoulder surgical incision is healing well.  No surrounding erythema or drainage.   Active forward flexion to 90 degrees.  Active abduction at her side to 90 degrees.  Internal rotation to her side.  10 degrees of external rotation at her side.  Sensation is intact in the axillary patch.  Sensation intact to the index finger, dorsal first webspace and small finger.  IMAGING: I personally ordered and reviewed the following images:  XR of the Left shoulder was obtained in clinic today and demonstrates a reverse shoulder arthroplasty with implants in good position.  No evidence of acute injury or subsidence of implants.  Unchanged from previous x-rays.  Impression: Left shoulder arthroplasty in good position   [name], MD 08/16/2020 11:37  AM

## 2020-08-17 ENCOUNTER — Ambulatory Visit (HOSPITAL_COMMUNITY): Payer: Medicare Other

## 2020-08-17 ENCOUNTER — Other Ambulatory Visit: Payer: Self-pay

## 2020-08-17 ENCOUNTER — Encounter (HOSPITAL_COMMUNITY): Payer: Self-pay

## 2020-08-17 DIAGNOSIS — M25612 Stiffness of left shoulder, not elsewhere classified: Secondary | ICD-10-CM

## 2020-08-17 DIAGNOSIS — R29898 Other symptoms and signs involving the musculoskeletal system: Secondary | ICD-10-CM

## 2020-08-17 DIAGNOSIS — M25512 Pain in left shoulder: Secondary | ICD-10-CM | POA: Diagnosis not present

## 2020-08-17 NOTE — Patient Instructions (Signed)

## 2020-08-18 NOTE — Therapy (Signed)
[facility] [place], [place], [address] Phone: [phone]   Fax:  [phone]  Occupational Therapy Treatment  Patient Details  Name: Stacy Webb MRN: [id number] Date of Birth: 06/04/1945 Referring Provider (OT): Dr. Larena Glassman   Encounter Date: 08/17/2020   OT End of Session - 08/18/20 1033    Visit Number 8    Number of Visits 16    Date for OT Re-Evaluation 09/23/20    Authorization Type U[employer] Medicare, $30 copay    Authorization Time Period no visit limit    Progress Note Due on Visit 17    OT Start Time 1115    OT Stop Time 1153    OT Time Calculation (min) 38 min    Activity Tolerance Patient tolerated treatment well    Behavior During Therapy [facility] for tasks assessed/performed           Past Medical History:  Diagnosis Date  . Anemia   . Anxiety   . Arthritis    fingers  . 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[place])   . Diabetes mellitus without complication ([place])   . Excessive hair growth 07/19/2015  . Heart murmur   . History of kidney stones   . Hyperlipidemia   . Hypertension   . Neuromuscular disorder ([place])    diabetic neuropathy  . Neuropathy   . Postmenopausal 07/19/2015    Past Surgical History:  Procedure Laterality Date  . ANTERIOR AND POSTERIOR REPAIR N/A 04/28/2018   Procedure: ANTERIOR (CYSTOCELE) AND POSTERIOR REPAIR (RECTOCELE);  Surgeon: Jonnie Kind, MD;  Location: [facility];  Service: Gynecology;  Laterality: N/A;  . APPENDECTOMY    . CARPAL TUNNEL RELEASE Bilateral 2001  . CESAREAN SECTION    . COLONOSCOPY N/A 08/28/2012   Procedure: COLONOSCOPY;  Surgeon: Rogene Houston, MD;  Location: [facility];  Service: Endoscopy;  Laterality: N/A;  915  . COLONOSCOPY N/A 12/25/2017   Procedure: COLONOSCOPY;  Surgeon: Rogene Houston, MD;  Location: [facility];  Service: Endoscopy;  Laterality: N/A;  12:45  . REVERSE SHOULDER ARTHROPLASTY Left 07/10/2020   Procedure: REVERSE SHOULDER  ARTHROPLASTY;  Surgeon: [name], MD;  Location: [facility];  Service: Orthopedics;  Laterality: Left;    There were no vitals filed for this visit.   Subjective Assessment - 08/17/20 1053    Subjective  S: I dont' have to wear the sling anymore. I drove myself here for the first time.    Currently in Pain? No/denies              [facility] OT Assessment - 08/17/20 1053      Assessment   Medical Diagnosis s/p left reverse TSA      Precautions   Precautions Shoulder    Type of Shoulder Precautions See protocol                    OT Treatments/Exercises (OP) - 08/17/20 1054      Exercises   Exercises Shoulder      Shoulder Exercises: Supine   Protraction PROM;5 reps    Horizontal ABduction PROM;5 reps    External Rotation PROM;5 reps    Internal Rotation PROM;5 reps    Flexion PROM;5 reps    ABduction PROM;5 reps      Shoulder Exercises: Standing   Protraction AAROM;10 reps    Horizontal ABduction AAROM;10 reps   with rest breaks   External Rotation AAROM;10 reps  Internal Rotation AAROM;10 reps    Flexion AAROM;10 reps    ABduction AAROM;10 reps      Shoulder Exercises: Pulleys   Flexion 1 minute   standing   ABduction 1 minute   standing     Shoulder Exercises: ROM/Strengthening   Wall Wash 1' mid level    Other ROM/Strengthening Exercises PVC pipe slide flexion 10X    Other ROM/Strengthening Exercises finger ladder; 2 trials; reached 10 first time then 12 last 2  times.      Manual Therapy   Manual Therapy Myofascial release    Manual therapy comments manual therapy completed seperately from all other interventions this date of service    Myofascial Release myofascial release and manual stretching to left upper arm, scapular, shoulder region to decrease pain and restrictions and improve pain free mobility in her left shoulder per protcol.                  OT Education - 08/17/20 1052    Education Details standing AA/ROM shoulder  exercises    Person(s) Educated Patient    Methods Explanation;Verbal cues;Handout;Demonstration    Comprehension Verbalized understanding;Returned demonstration            OT Short Term Goals - 08/15/20 1203      OT SHORT TERM GOAL #1   Title Patient will be educated and demonstrate understanding of HEP in order to increase functional use of her LUE during basic daily tasks while increasing ROM and strength.    Time 4    Period Weeks    Status On-going    Target Date 08/24/20      OT SHORT TERM GOAL #2   Title Pt will increase LUE P/ROM to [facility] to improve ability to complete dressing tasks with minimal compensatory strategies.    Time 4    Period Weeks    Status On-going      OT SHORT TERM GOAL #3   Title Pt will increase LUE strength to 3+/5 to improve ability to reach items at waist to chest height during ADLs.    Time 4    Period Weeks    Status On-going             OT Long Term Goals - 08/01/20 1021      OT LONG TERM GOAL #1   Title Pt will decrease LUE pain to 3/10 or less to improve ability to use LUE as assist during ADLs and leisure tasks.    Time 8    Period Weeks    Status On-going      OT LONG TERM GOAL #2   Title Pt will decrease LUE fascial restrictions to minimal amounts to improve mobility required for functional reaching tasks.    Time 8    Period Weeks    Status On-going      OT LONG TERM GOAL #3   Title Pt will increase LUE A/ROM to [facility] to improve ability to perform functional reaching overhead and behind back during dressing and bathing tasks.    Time 8    Period Weeks    Status On-going      OT LONG TERM GOAL #4   Title Pt will increase LUE strength to 4+/5 or greater to improve ability to perform housekeeping tasks using LUE as non-dominant.    Time 8    Period Weeks    Status On-going  Plan - 08/18/20 1034    Clinical Impression Statement A: Patient with trace fascial restrictions noted this date in the left  upper arm, upper trapezius and scapularis region with manual techniques completed to address. Continues to be limited with tolerance of progressive passive stretching. Completed AA/ROM standing and provided to HEP. VC for form and technique provided throughout session.    Body Structure / Function / Physical Skills ADL;Endurance;UE functional use;Fascial restriction;Muscle spasms;Pain;ROM;IADL;Strength;Edema;Mobility    Plan P: Attempt ball circles for improved mobility.    Consulted and Agree with Plan of Care Patient           Patient will benefit from skilled therapeutic intervention in order to improve the following deficits and impairments:   Body Structure / Function / Physical Skills: ADL,Endurance,UE functional use,Fascial restriction,Muscle spasms,Pain,ROM,IADL,Strength,Edema,Mobility       Visit Diagnosis: Stiffness of left shoulder, not elsewhere classified  Acute pain of left shoulder  Other symptoms and signs involving the musculoskeletal system    Problem List Patient Active Problem List   Diagnosis Date Noted  . Rotator cuff arthropathy of left shoulder 07/10/2020  . Absolute anemia 10/30/2017  . Constipation 06/10/2017  . Hemorrhoids 06/10/2017  . Cystocele with rectocele 06/10/2017  . Rectocele 06/10/2017  . Diabetic foot ulcer ([place]) 09/27/2016  . CKD (chronic kidney disease), stage III ([place]) 09/27/2016  . 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[place]) 09/27/2016  . Hyperlipidemia 09/27/2016  . Hypertension 09/27/2016  . Diabetic ulcer of foot associated with diabetes mellitus due to underlying condition, limited to breakdown of skin ([place]) 09/27/2016  . Postmenopausal 07/19/2015  . Essential hypertension, benign 08/11/2012  . High cholesterol 08/11/2012  . Family hx of colon cancer 08/11/2012  . Diabetes [facility]) 08/11/2012    Ailene Ravel, OTR/L,CBIS  [phone]  08/18/2020, 10:36 AM  [place], [place], [address] Phone: [phone]   Fax:  [phone]  Name: Stacy Webb MRN: [id number] Date of Birth: 1945-11-13

## 2020-08-22 ENCOUNTER — Encounter (HOSPITAL_COMMUNITY): Payer: Self-pay | Admitting: Occupational Therapy

## 2020-08-22 ENCOUNTER — Other Ambulatory Visit: Payer: Self-pay

## 2020-08-22 ENCOUNTER — Ambulatory Visit (HOSPITAL_COMMUNITY): Payer: Medicare Other | Admitting: Occupational Therapy

## 2020-08-22 DIAGNOSIS — R29898 Other symptoms and signs involving the musculoskeletal system: Secondary | ICD-10-CM

## 2020-08-22 DIAGNOSIS — M25512 Pain in left shoulder: Secondary | ICD-10-CM

## 2020-08-22 DIAGNOSIS — M25612 Stiffness of left shoulder, not elsewhere classified: Secondary | ICD-10-CM

## 2020-08-22 NOTE — Therapy (Signed)
[place], [place], [address] Phone: [phone]   Fax:  [phone]  Occupational Therapy Treatment  Patient Details  Name: Stacy Webb MRN: [id number] Date of Birth: [birth year] Referring Provider (OT): Dr. Larena Glassman   Encounter Date: 08/22/2020   OT End of Session - 08/22/20 1137    Visit Number 9    Number of Visits 16    Date for OT Re-Evaluation 09/23/20    Authorization Type UHC Medicare, $30 copay    Authorization Time Period no visit limit    Progress Note Due on Visit 17    OT Start Time 1030    OT Stop Time 1114    OT Time Calculation (min) 44 min    Activity Tolerance Patient tolerated treatment well    Behavior During Therapy [facility] for tasks assessed/performed           Past Medical History:  Diagnosis Date  . Anemia   . Anxiety   . Arthritis    fingers  . 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[place])   . Diabetes mellitus without complication ([place])   . Excessive hair growth 07/19/2015  . Heart murmur   . History of kidney stones   . Hyperlipidemia   . Hypertension   . Neuromuscular disorder ([place])    diabetic neuropathy  . Neuropathy   . Postmenopausal 07/19/2015    Past Surgical History:  Procedure Laterality Date  . ANTERIOR AND POSTERIOR REPAIR N/A 04/28/2018   Procedure: ANTERIOR (CYSTOCELE) AND POSTERIOR REPAIR (RECTOCELE);  Surgeon: Jonnie Kind, MD;  Location: [facility] ORS;  Service: Gynecology;  Laterality: N/A;  . APPENDECTOMY    . CARPAL TUNNEL RELEASE Bilateral 2001  . CESAREAN SECTION    . COLONOSCOPY N/A 08/28/2012   Procedure: COLONOSCOPY;  Surgeon: Rogene Houston, MD;  Location: [facility] ENDO SUITE;  Service: Endoscopy;  Laterality: N/A;  915  . COLONOSCOPY N/A 12/25/2017   Procedure: COLONOSCOPY;  Surgeon: Rogene Houston, MD;  Location: [facility] SU[facility];  Service: Endoscopy;  Laterality: N/A;  12:45  . REVERSE SHOULDER ARTHROPLASTY Left 07/10/2020   Procedure: REVERSE SHOULDER  ARTHROPLASTY;  Surgeon: [name], MD;  Location: [facility] ORS;  Service: Orthopedics;  Laterality: Left;    There were no vitals filed for this visit.   Subjective Assessment - 08/22/20 1030    Subjective  S: It feels like it's a little bruised for some reason.    Currently in Pain? No/denies              [facility] OT Assessment - 08/22/20 1029      Assessment   Medical Diagnosis s/p left reverse TSA      Precautions   Precautions Shoulder    Type of Shoulder Precautions See protocol                    OT Treatments/Exercises (OP) - 08/22/20 1036      Exercises   Exercises Shoulder      Shoulder Exercises: Supine   Protraction PROM;5 reps    Horizontal ABduction PROM;5 reps    External Rotation PROM;5 reps    Internal Rotation PROM;5 reps    Flexion PROM;5 reps    ABduction PROM;5 reps      Shoulder Exercises: Standing   Protraction AAROM;12 reps    Horizontal ABduction AAROM;10 reps    External Rotation AAROM;12 reps    Internal Rotation AAROM;12 reps  Flexion AAROM;12 reps    ABduction AAROM;12 reps      Shoulder Exercises: Pulleys   Flexion 1 minute    ABduction 1 minute      Shoulder Exercises: Therapy Ball   Right/Left 5 reps   each direction     Shoulder Exercises: ROM/Strengthening   Wall Wash 1'    Other ROM/Strengthening Exercises PVC pipe slide flexion 10X      Manual Therapy   Manual Therapy Myofascial release    Manual therapy comments manual therapy completed seperately from all other interventions this date of service    Myofascial Release myofascial release and manual stretching to left upper arm, scapular, shoulder region to decrease pain and restrictions and improve pain free mobility in her left shoulder per protcol.                    OT Short Term Goals - 08/15/20 1203      OT SHORT TERM GOAL #1   Title Patient will be educated and demonstrate understanding of HEP in order to increase functional use of her LUE  during basic daily tasks while increasing ROM and strength.    Time 4    Period Weeks    Status On-going    Target Date 08/24/20      OT SHORT TERM GOAL #2   Title Pt will increase LUE P/ROM to [facility] to improve ability to complete dressing tasks with minimal compensatory strategies.    Time 4    Period Weeks    Status On-going      OT SHORT TERM GOAL #3   Title Pt will increase LUE strength to 3+/5 to improve ability to reach items at waist to chest height during ADLs.    Time 4    Period Weeks    Status On-going             OT Long Term Goals - 08/01/20 1021      OT LONG TERM GOAL #1   Title Pt will decrease LUE pain to 3/10 or less to improve ability to use LUE as assist during ADLs and leisure tasks.    Time 8    Period Weeks    Status On-going      OT LONG TERM GOAL #2   Title Pt will decrease LUE fascial restrictions to minimal amounts to improve mobility required for functional reaching tasks.    Time 8    Period Weeks    Status On-going      OT LONG TERM GOAL #3   Title Pt will increase LUE A/ROM to [facility] to improve ability to perform functional reaching overhead and behind back during dressing and bathing tasks.    Time 8    Period Weeks    Status On-going      OT LONG TERM GOAL #4   Title Pt will increase LUE strength to 4+/5 or greater to improve ability to perform housekeeping tasks using LUE as non-dominant.    Time 8    Period Weeks    Status On-going                 Plan - 08/22/20 1138    Clinical Impression Statement A: Pt reports her right arm is getting tired from using it so much. Continued with passive stretching and standing A/ROM, cuing for posture and not leaning back while completing exercises. Continued with wall wash and pvc pipe slide, pulleys, added ball circles for gentle IR.  Verbal cuing for form and technique.    Body Structure / Function / Physical Skills ADL;Endurance;UE functional use;Fascial restriction;Muscle  spasms;Pain;ROM;IADL;Strength;Edema;Mobility    Plan P: Continue with standing A/ROM working towards improved form, add gentle scapular strengthening with red band    Consulted and Agree with Plan of Care Patient           Patient will benefit from skilled therapeutic intervention in order to improve the following deficits and impairments:   Body Structure / Function / Physical Skills: ADL,Endurance,UE functional use,Fascial restriction,Muscle spasms,Pain,ROM,IADL,Strength,Edema,Mobility       Visit Diagnosis: Stiffness of left shoulder, not elsewhere classified  Acute pain of left shoulder  Other symptoms and signs involving the musculoskeletal system    Problem List Patient Active Problem List   Diagnosis Date Noted  . Rotator cuff arthropathy of left shoulder 07/10/2020  . Absolute anemia 10/30/2017  . Constipation 06/10/2017  . Hemorrhoids 06/10/2017  . Cystocele with rectocele 06/10/2017  . Rectocele 06/10/2017  . Diabetic foot ulcer ([place]) 09/27/2016  . CKD (chronic kidney disease), stage III ([place]) 09/27/2016  . 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[place]) 09/27/2016  . Hyperlipidemia 09/27/2016  . Hypertension 09/27/2016  . Diabetic ulcer of foot associated with diabetes mellitus due to underlying condition, limited to breakdown of skin ([place]) 09/27/2016  . Postmenopausal 07/19/2015  . Essential hypertension, benign 08/11/2012  . High cholesterol 08/11/2012  . Family hx of colon cancer 08/11/2012  . Diabetes [facility]) 08/11/2012   Guadelupe Sabin, OTR/L  [phone] 08/22/2020, 11:41 AM  [facility] [address] [place], [place], [address] Phone: [phone]   Fax:  [phone]  Name: TAFFANY HEISER MRN: [id number] Date of Birth: [birth year]

## 2020-08-24 ENCOUNTER — Encounter (HOSPITAL_COMMUNITY): Payer: Self-pay | Admitting: Occupational Therapy

## 2020-08-24 ENCOUNTER — Other Ambulatory Visit: Payer: Self-pay

## 2020-08-24 ENCOUNTER — Ambulatory Visit (HOSPITAL_COMMUNITY): Payer: Medicare Other | Admitting: Occupational Therapy

## 2020-08-24 DIAGNOSIS — M25512 Pain in left shoulder: Secondary | ICD-10-CM | POA: Diagnosis not present

## 2020-08-24 DIAGNOSIS — R29898 Other symptoms and signs involving the musculoskeletal system: Secondary | ICD-10-CM

## 2020-08-24 DIAGNOSIS — M25612 Stiffness of left shoulder, not elsewhere classified: Secondary | ICD-10-CM

## 2020-08-24 NOTE — Therapy (Signed)
[facility] [address] [place], [place], [address] Phone: [phone]   Fax:  [phone]  Occupational Therapy Treatment  Patient Details  Name: Stacy Webb MRN: [id number] Date of Birth: Feb 28, 1946 Referring Provider (OT): Dr. Larena Glassman   Encounter Date: 08/24/2020   OT End of Session - 08/24/20 1025    Visit Number 10    Number of Visits 16    Date for OT Re-Evaluation 09/23/20    Authorization Type UHC Medicare, $30 copay    Authorization Time Period no visit limit    Progress Note Due on Visit 17    OT Start Time [phone]    OT Stop Time 1029    OT Time Calculation (min) 33 min    Activity Tolerance Patient tolerated treatment well    Behavior During Therapy [facility] for tasks assessed/performed           Past Medical History:  Diagnosis Date  . Anemia   . Anxiety   . Arthritis    fingers  . 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[place])   . Diabetes mellitus without complication ([place])   . Excessive hair growth 07/19/2015  . Heart murmur   . History of kidney stones   . Hyperlipidemia   . Hypertension   . Neuromuscular disorder ([place])    diabetic neuropathy  . Neuropathy   . Postmenopausal 07/19/2015    Past Surgical History:  Procedure Laterality Date  . ANTERIOR AND POSTERIOR REPAIR N/A 04/28/2018   Procedure: ANTERIOR (CYSTOCELE) AND POSTERIOR REPAIR (RECTOCELE);  Surgeon: Jonnie Kind, MD;  Location: [facility]S;  Service: Gynecology;  Laterality: N/A;  . APPENDECTOMY    . CARPAL TUNNEL RELEASE Bilateral 2001  . CESAREAN SECTION    . COLONOSCOPY N/A 08/28/2012   Procedure: COLONOSCOPY;  Surgeon: Rogene Houston, MD;  Location: [facility];  Service: Endoscopy;  Laterality: N/A;  915  . COLONOSCOPY N/A 12/25/2017   Procedure: COLONOSCOPY;  Surgeon: Rogene Houston, MD;  Location: [facility];  Service: Endoscopy;  Laterality: N/A;  12:45  . REVERSE SHOULDER ARTHROPLASTY Left 07/10/2020   Procedure: REVERSE  SHOULDER ARTHROPLASTY;  Surgeon: [name], MD;  Location: [facility] ORS;  Service: Orthopedics;  Laterality: Left;    There were no vitals filed for this visit.                 OT Treatments/Exercises (OP) - 08/24/20 0959      Exercises   Exercises Shoulder      Shoulder Exercises: Supine   Protraction PROM;5 reps;AAROM;12 reps    Horizontal ABduction PROM;5 reps;AAROM;12 reps    External Rotation PROM;5 reps;AAROM;12 reps    Internal Rotation PROM;5 reps;AAROM;12 reps    Flexion PROM;5 reps;AAROM;12 reps    ABduction PROM;5 reps;AAROM;12 reps      Shoulder Exercises: Standing   Protraction AAROM;12 reps    Horizontal ABduction AAROM;10 reps    External Rotation AAROM;12 reps    Internal Rotation AAROM;12 reps    Flexion AAROM;12 reps    ABduction AAROM;12 reps      Shoulder Exercises: ROM/Strengthening   Wall Wash 1'    Proximal Shoulder Strengthening, Supine 10X each, no rest breaks    Other ROM/Strengthening Exercises PVC pipe slide flexion 15X                    OT Short Term Goals - 08/15/20 1203      OT SHORT TERM  GOAL #1   Title Patient will be educated and demonstrate understanding of HEP in order to increase functional use of her LUE during basic daily tasks while increasing ROM and strength.    Time 4    Period Weeks    Status On-going    Target Date 08/24/20      OT SHORT TERM GOAL #2   Title Pt will increase LUE P/ROM to [facility] to improve ability to complete dressing tasks with minimal compensatory strategies.    Time 4    Period Weeks    Status On-going      OT SHORT TERM GOAL #3   Title Pt will increase LUE strength to 3+/5 to improve ability to reach items at waist to chest height during ADLs.    Time 4    Period Weeks    Status On-going             OT Long Term Goals - 08/01/20 1021      OT LONG TERM GOAL #1   Title Pt will decrease LUE pain to 3/10 or less to improve ability to use LUE as assist during ADLs and leisure  tasks.    Time 8    Period Weeks    Status On-going      OT LONG TERM GOAL #2   Title Pt will decrease LUE fascial restrictions to minimal amounts to improve mobility required for functional reaching tasks.    Time 8    Period Weeks    Status On-going      OT LONG TERM GOAL #3   Title Pt will increase LUE A/ROM to [facility] to improve ability to perform functional reaching overhead and behind back during dressing and bathing tasks.    Time 8    Period Weeks    Status On-going      OT LONG TERM GOAL #4   Title Pt will increase LUE strength to 4+/5 or greater to improve ability to perform housekeeping tasks using LUE as non-dominant.    Time 8    Period Weeks    Status On-going                 Plan - 08/24/20 1021    Clinical Impression Statement A: Pt arrived late for session. Continued with passive stretching and AA/ROM. Pt with right side fatigue therefore rest breaks provided as needed. In sitting, pt only able to achieve approximately 50% range with flexion due to weakness and fatigue. Added proximal shoulder strengthening in supine and continued with PVC pipe slide and wall wash.    Body Structure / Function / Physical Skills ADL;Endurance;UE functional use;Fascial restriction;Muscle spasms;Pain;ROM;IADL;Strength;Edema;Mobility    Plan P: Attempt A/ROM in supine with left arm only (give RUE a break), Add gentle scapular strengthening with red theraband    Consulted and Agree with Plan of Care Patient           Patient will benefit from skilled therapeutic intervention in order to improve the following deficits and impairments:   Body Structure / Function / Physical Skills: ADL,Endurance,UE functional use,Fascial restriction,Muscle spasms,Pain,ROM,IADL,Strength,Edema,Mobility       Visit Diagnosis: Stiffness of left shoulder, not elsewhere classified  Acute pain of left shoulder  Other symptoms and signs involving the musculoskeletal system    Problem  List Patient Active Problem List   Diagnosis Date Noted  . Rotator cuff arthropathy of left shoulder 07/10/2020  . Absolute anemia 10/30/2017  . Constipation 06/10/2017  . Hemorrhoids 06/10/2017  .  Cystocele with rectocele 06/10/2017  . Rectocele 06/10/2017  . Diabetic foot ulcer ([place]) 09/27/2016  . CKD (chronic kidney disease), stage III ([place]) 09/27/2016  . 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[place]) 09/27/2016  . Hyperlipidemia 09/27/2016  . Hypertension 09/27/2016  . Diabetic ulcer of foot associated with diabetes mellitus due to underlying condition, limited to breakdown of skin ([place]) 09/27/2016  . Postmenopausal 07/19/2015  . Essential hypertension, benign 08/11/2012  . High cholesterol 08/11/2012  . Family hx of colon cancer 08/11/2012  . Diabetes [facility]) 08/11/2012   Guadelupe Sabin, OTR/L  [phone] 08/24/2020, 10:30 AM  [facility] [place], [place], [address] Phone: [phone]   Fax:  [phone]  Name: Stacy Webb MRN: [id number] Date of Birth: 01-17-1946

## 2020-08-29 ENCOUNTER — Other Ambulatory Visit: Payer: Self-pay

## 2020-08-29 ENCOUNTER — Encounter (HOSPITAL_COMMUNITY): Payer: Self-pay | Admitting: Occupational Therapy

## 2020-08-29 ENCOUNTER — Ambulatory Visit (HOSPITAL_COMMUNITY): Payer: Medicare Other | Admitting: Occupational Therapy

## 2020-08-29 DIAGNOSIS — R29898 Other symptoms and signs involving the musculoskeletal system: Secondary | ICD-10-CM

## 2020-08-29 DIAGNOSIS — M25612 Stiffness of left shoulder, not elsewhere classified: Secondary | ICD-10-CM

## 2020-08-29 DIAGNOSIS — M25512 Pain in left shoulder: Secondary | ICD-10-CM | POA: Diagnosis not present

## 2020-08-29 NOTE — Therapy (Signed)
[facility] [place], [place], [address] Phone: [phone]   Fax:  [phone]  Occupational Therapy Treatment  Patient Details  Name: Stacy Webb MRN: [id number] Date of Birth: [birth year] Referring Provider (OT): Dr. Larena Glassman   Encounter Date: 08/29/2020   OT End of Session - 08/29/20 1113    Visit Number 11    Number of Visits 16    Date for OT Re-Evaluation 09/23/20    Authorization Type UHC Medicare, $30 copay    Authorization Time Period no visit limit    Progress Note Due on Visit 17    OT Start Time 1031    OT Stop Time 1111    OT Time Calculation (min) 40 min    Activity Tolerance Patient tolerated treatment well    Behavior During Therapy [facility] for tasks assessed/performed           Past Medical History:  Diagnosis Date  . Anemia   . Anxiety   . Arthritis    fingers  . 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[place])   . Diabetes mellitus without complication ([place])   . Excessive hair growth 07/19/2015  . Heart murmur   . History of kidney stones   . Hyperlipidemia   . Hypertension   . Neuromuscular disorder ([place])    diabetic neuropathy  . Neuropathy   . Postmenopausal 07/19/2015    Past Surgical History:  Procedure Laterality Date  . ANTERIOR AND POSTERIOR REPAIR N/A 04/28/2018   Procedure: ANTERIOR (CYSTOCELE) AND POSTERIOR REPAIR (RECTOCELE);  Surgeon: Jonnie Kind, MD;  Location: [facility] ORS;  Service: Gynecology;  Laterality: N/A;  . APPENDECTOMY    . CARPAL TUNNEL RELEASE Bilateral 2001  . CESAREAN SECTION    . COLONOSCOPY N/A 08/28/2012   Procedure: COLONOSCOPY;  Surgeon: Rogene Houston, MD;  Location: [facility];  Service: Endoscopy;  Laterality: N/A;  915  . COLONOSCOPY N/A 12/25/2017   Procedure: COLONOSCOPY;  Surgeon: Rogene Houston, MD;  Location: [facility];  Service: Endoscopy;  Laterality: N/A;  12:45  . REVERSE SHOULDER ARTHROPLASTY Left 07/10/2020   Procedure: REVERSE  SHOULDER ARTHROPLASTY;  Surgeon: [name], MD;  Location: [facility] ORS;  Service: Orthopedics;  Laterality: Left;    There were no vitals filed for this visit.   Subjective Assessment - 08/29/20 1031    Subjective  S: My back was hurting where that sling was.    Currently in Pain? No/denies              [facility] OT Assessment - 08/29/20 1031      Assessment   Medical Diagnosis s/p left reverse TSA      Precautions   Precautions Shoulder    Type of Shoulder Precautions See protocol                    OT Treatments/Exercises (OP) - 08/29/20 1034      Exercises   Exercises Shoulder      Shoulder Exercises: Supine   Protraction PROM;5 reps;AROM;10 reps    Horizontal ABduction PROM;5 reps;AROM;10 reps    External Rotation PROM;5 reps;AROM;10 reps    Internal Rotation PROM;5 reps;AROM;10 reps    Flexion PROM;5 reps;AROM;10 reps    ABduction PROM;5 reps;AROM;10 reps      Shoulder Exercises: Standing   Protraction AROM;10 reps    Horizontal ABduction AAROM;10 reps    External Rotation AAROM;12 reps    Internal Rotation AAROM;12  reps    Flexion AAROM;12 reps    ABduction AAROM;12 reps    Extension Theraband;10 reps    Theraband Level (Shoulder Extension) Level 2 (Red)    Row Theraband;10 reps    Theraband Level (Shoulder Row) Level 2 (Red)      Shoulder Exercises: Pulleys   Flexion 1 minute      Shoulder Exercises: ROM/Strengthening   UBE (Upper Arm Bike) Level 1 2' reverse, pace 5.0    Wall Wash 1'    Proximal Shoulder Strengthening, Supine 10X each, no rest breaks      Manual Therapy   Manual Therapy Myofascial release    Manual therapy comments manual therapy completed seperately from all other interventions this date of service    Myofascial Release myofascial release and manual stretching to left upper arm, scapular, shoulder region to decrease pain and restrictions and improve pain free mobility in her left shoulder per protcol.                     OT Short Term Goals - 08/15/20 1203      OT SHORT TERM GOAL #1   Title Patient will be educated and demonstrate understanding of HEP in order to increase functional use of her LUE during basic daily tasks while increasing ROM and strength.    Time 4    Period Weeks    Status On-going    Target Date 08/24/20      OT SHORT TERM GOAL #2   Title Pt will increase LUE P/ROM to [facility] to improve ability to complete dressing tasks with minimal compensatory strategies.    Time 4    Period Weeks    Status On-going      OT SHORT TERM GOAL #3   Title Pt will increase LUE strength to 3+/5 to improve ability to reach items at waist to chest height during ADLs.    Time 4    Period Weeks    Status On-going             OT Long Term Goals - 08/01/20 1021      OT LONG TERM GOAL #1   Title Pt will decrease LUE pain to 3/10 or less to improve ability to use LUE as assist during ADLs and leisure tasks.    Time 8    Period Weeks    Status On-going      OT LONG TERM GOAL #2   Title Pt will decrease LUE fascial restrictions to minimal amounts to improve mobility required for functional reaching tasks.    Time 8    Period Weeks    Status On-going      OT LONG TERM GOAL #3   Title Pt will increase LUE A/ROM to [facility] to improve ability to perform functional reaching overhead and behind back during dressing and bathing tasks.    Time 8    Period Weeks    Status On-going      OT LONG TERM GOAL #4   Title Pt will increase LUE strength to 4+/5 or greater to improve ability to perform housekeeping tasks using LUE as non-dominant.    Time 8    Period Weeks    Status On-going                 Plan - 08/29/20 1051    Clinical Impression Statement A: Pt reports she did not complete her HEP yesterday. Continued with myofascial release and passive stretching. Progressed to  A/ROM in supine, pt completing with occasional rest breaks for fatigue. Continued with AA/ROM in  standing, pulleys, and added scapular theraband row and extension as well as UBE in reverse for retraction. Verbal cuing for form and technique.    Body Structure / Function / Physical Skills ADL;Endurance;UE functional use;Fascial restriction;Muscle spasms;Pain;ROM;IADL;Strength;Edema;Mobility    Plan P: Continue progressing towards A/ROM, scapular theraband work    OT Home Exercise Plan 5/12: standing AA/ROM    Consulted and Agree with Plan of Care Patient           Patient will benefit from skilled therapeutic intervention in order to improve the following deficits and impairments:   Body Structure / Function / Physical Skills: ADL,Endurance,UE functional use,Fascial restriction,Muscle spasms,Pain,ROM,IADL,Strength,Edema,Mobility       Visit Diagnosis: Stiffness of left shoulder, not elsewhere classified  Acute pain of left shoulder  Other symptoms and signs involving the musculoskeletal system    Problem List Patient Active Problem List   Diagnosis Date Noted  . Rotator cuff arthropathy of left shoulder 07/10/2020  . Absolute anemia 10/30/2017  . Constipation 06/10/2017  . Hemorrhoids 06/10/2017  . Cystocele with rectocele 06/10/2017  . Rectocele 06/10/2017  . Diabetic foot ulcer ([place]) 09/27/2016  . CKD (chronic kidney disease), stage III ([place]) 09/27/2016  . 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[place]) 09/27/2016  . Hyperlipidemia 09/27/2016  . Hypertension 09/27/2016  . Diabetic ulcer of foot associated with diabetes mellitus due to underlying condition, limited to breakdown of skin ([place]) 09/27/2016  . Postmenopausal 07/19/2015  . Essential hypertension, benign 08/11/2012  . High cholesterol 08/11/2012  . Family hx of colon cancer 08/11/2012  . Diabetes [facility]) 08/11/2012   Guadelupe Sabin, OTR/L  [phone] 08/29/2020, [facility] [address] [place], [place], [address] Phone: [phone]   Fax:   [phone]  Name: Stacy Webb MRN: [id number] Date of Birth: 1945/06/01

## 2020-08-31 ENCOUNTER — Other Ambulatory Visit: Payer: Self-pay

## 2020-08-31 ENCOUNTER — Ambulatory Visit (HOSPITAL_COMMUNITY): Payer: Medicare Other | Admitting: Occupational Therapy

## 2020-08-31 ENCOUNTER — Encounter (HOSPITAL_COMMUNITY): Payer: Self-pay | Admitting: Occupational Therapy

## 2020-08-31 DIAGNOSIS — M25612 Stiffness of left shoulder, not elsewhere classified: Secondary | ICD-10-CM

## 2020-08-31 DIAGNOSIS — M25512 Pain in left shoulder: Secondary | ICD-10-CM

## 2020-08-31 DIAGNOSIS — R29898 Other symptoms and signs involving the musculoskeletal system: Secondary | ICD-10-CM

## 2020-08-31 NOTE — Therapy (Signed)
[facility] [address] [place], [place], [address] Phone: [phone]   Fax:  [phone]  Occupational Therapy Treatment  Patient Details  Name: Stacy Webb MRN: [id number] Date of Birth: 1946/02/03 Referring Provider (OT): Dr. Larena Glassman   Encounter Date: 08/31/2020   OT End of Session - 08/31/20 1019    Visit Number 12    Number of Visits 16    Date for OT Re-Evaluation 09/23/20    Authorization Type U[employer] Medicare, $30 copay    Authorization Time Period no visit limit    Progress Note Due on Visit 17    OT Start Time 0940    OT Stop Time 1020    OT Time Calculation (min) 40 min    Activity Tolerance Patient tolerated treatment well    Behavior During Therapy [facility] for tasks assessed/performed           Past Medical History:  Diagnosis Date  . Anemia   . Anxiety   . Arthritis    fingers  . 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[place])   . Diabetes mellitus without complication ([place])   . Excessive hair growth 07/19/2015  . Heart murmur   . History of kidney stones   . Hyperlipidemia   . Hypertension   . Neuromuscular disorder ([place])    diabetic neuropathy  . Neuropathy   . Postmenopausal 07/19/2015    Past Surgical History:  Procedure Laterality Date  . ANTERIOR AND POSTERIOR REPAIR N/A 04/28/2018   Procedure: ANTERIOR (CYSTOCELE) AND POSTERIOR REPAIR (RECTOCELE);  Surgeon: Jonnie Kind, MD;  Location: [facility] ORS;  Service: Gynecology;  Laterality: N/A;  . APPENDECTOMY    . CARPAL TUNNEL RELEASE Bilateral 2001  . CESAREAN SECTION    . COLONOSCOPY N/A 08/28/2012   Procedure: COLONOSCOPY;  Surgeon: Rogene Houston, MD;  Location: [facility] ENDO SUITE;  Service: Endoscopy;  Laterality: N/A;  915  . COLONOSCOPY N/A 12/25/2017   Procedure: COLONOSCOPY;  Surgeon: Rogene Houston, MD;  Location: [facility] SU[facility];  Service: Endoscopy;  Laterality: N/A;  12:45  . REVERSE SHOULDER ARTHROPLASTY Left 07/10/2020   Procedure: REVERSE  SHOULDER ARTHROPLASTY;  Surgeon: [name], MD;  Location: [facility] ORS;  Service: Orthopedics;  Laterality: Left;    There were no vitals filed for this visit.   Subjective Assessment - 08/31/20 0940    Subjective  S: I'm feeling pretty good.    Currently in Pain? No/denies              [facility] OT Assessment - 08/31/20 0939      Assessment   Medical Diagnosis s/p left reverse TSA      Precautions   Precautions Shoulder    Type of Shoulder Precautions See protocol                    OT Treatments/Exercises (OP) - 08/31/20 0939      Exercises   Exercises Shoulder      Shoulder Exercises: Supine   Protraction PROM;5 reps;AROM;10 reps    Horizontal ABduction PROM;5 reps;AROM;10 reps    External Rotation PROM;5 reps;AROM;10 reps    Internal Rotation PROM;5 reps;AROM;10 reps    Flexion PROM;5 reps;AROM;10 reps    ABduction PROM;5 reps;AROM;10 reps      Shoulder Exercises: Standing   External Rotation AAROM;12 reps    Flexion AAROM;12 reps    ABduction AAROM;12 reps    Extension Theraband;10 reps    Theraband Level (  Shoulder Extension) Level 2 (Red)    Row Theraband;10 reps    Theraband Level (Shoulder Row) Level 2 (Red)      Shoulder Exercises: Pulleys   Flexion 1 minute      Shoulder Exercises: ROM/Strengthening   UBE (Upper Arm Bike) Level 1 2' foward 2' reverse, pace: 5.0    Proximal Shoulder Strengthening, Supine 10X each, no rest breaks      Functional Reaching Activities   Mid Level Pt placing 5 cones in flexion on middle shelf of overhead cabinet with max effort      Manual Therapy   Manual Therapy Myofascial release    Manual therapy comments manual therapy completed seperately from all other interventions this date of service    Myofascial Release myofascial release and manual stretching to left upper arm, scapular, shoulder region to decrease pain and restrictions and improve pain free mobility in her left shoulder per protcol.                     OT Short Term Goals - 08/15/20 1203      OT SHORT TERM GOAL #1   Title Patient will be educated and demonstrate understanding of HEP in order to increase functional use of her LUE during basic daily tasks while increasing ROM and strength.    Time 4    Period Weeks    Status On-going    Target Date 08/24/20      OT SHORT TERM GOAL #2   Title Pt will increase LUE P/ROM to [facility] to improve ability to complete dressing tasks with minimal compensatory strategies.    Time 4    Period Weeks    Status On-going      OT SHORT TERM GOAL #3   Title Pt will increase LUE strength to 3+/5 to improve ability to reach items at waist to chest height during ADLs.    Time 4    Period Weeks    Status On-going             OT Long Term Goals - 08/01/20 1021      OT LONG TERM GOAL #1   Title Pt will decrease LUE pain to 3/10 or less to improve ability to use LUE as assist during ADLs and leisure tasks.    Time 8    Period Weeks    Status On-going      OT LONG TERM GOAL #2   Title Pt will decrease LUE fascial restrictions to minimal amounts to improve mobility required for functional reaching tasks.    Time 8    Period Weeks    Status On-going      OT LONG TERM GOAL #3   Title Pt will increase LUE A/ROM to [facility] to improve ability to perform functional reaching overhead and behind back during dressing and bathing tasks.    Time 8    Period Weeks    Status On-going      OT LONG TERM GOAL #4   Title Pt will increase LUE strength to 4+/5 or greater to improve ability to perform housekeeping tasks using LUE as non-dominant.    Time 8    Period Weeks    Status On-going                 Plan - 08/31/20 1007    Clinical Impression Statement A: Continued with myofascial release to address fascial restrictions, passive stretching and A/ROM in supine. Pt completing functional reaching task  however has max difficulty reaching above 50% ROM and required effort for placing  5 cones with rest breaks between. Continued with AA/ROM in standing, also with difficulty achieving ROM greater than 50% in flexion. Continued with scapular theraband and UBE. Verbal and tactile cuing for form and technique during session.    Body Structure / Function / Physical Skills ADL;Endurance;UE functional use;Fascial restriction;Muscle spasms;Pain;ROM;IADL;Strength;Edema;Mobility    Plan P: Continue progressing towards A/ROM, functional reaching task with pinch tree    OT Home Exercise Plan 5/12: standing AA/ROM    Consulted and Agree with Plan of Care Patient           Patient will benefit from skilled therapeutic intervention in order to improve the following deficits and impairments:   Body Structure / Function / Physical Skills: ADL,Endurance,UE functional use,Fascial restriction,Muscle spasms,Pain,ROM,IADL,Strength,Edema,Mobility       Visit Diagnosis: Stiffness of left shoulder, not elsewhere classified  Acute pain of left shoulder  Other symptoms and signs involving the musculoskeletal system    Problem List Patient Active Problem List   Diagnosis Date Noted  . Rotator cuff arthropathy of left shoulder 07/10/2020  . Absolute anemia 10/30/2017  . Constipation 06/10/2017  . Hemorrhoids 06/10/2017  . Cystocele with rectocele 06/10/2017  . Rectocele 06/10/2017  . Diabetic foot ulcer ([place]) 09/27/2016  . CKD (chronic kidney disease), stage III ([place]) 09/27/2016  . 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[place]) 09/27/2016  . Hyperlipidemia 09/27/2016  . Hypertension 09/27/2016  . Diabetic ulcer of foot associated with diabetes mellitus due to underlying condition, limited to breakdown of skin ([place]) 09/27/2016  . Postmenopausal 07/19/2015  . Essential hypertension, benign 08/11/2012  . High cholesterol 08/11/2012  . Family hx of colon cancer 08/11/2012  . Diabetes [facility]) 08/11/2012   Guadelupe Sabin, OTR/L  [phone] 08/31/2020, 10:22 AM  [facility] [address] [place], [place], [address] Phone: [phone]   Fax:  [phone]  Name: Stacy Webb MRN: [id number] Date of Birth: [birth year]

## 2020-09-05 ENCOUNTER — Other Ambulatory Visit: Payer: Self-pay

## 2020-09-05 ENCOUNTER — Encounter (HOSPITAL_COMMUNITY): Payer: Self-pay | Admitting: Occupational Therapy

## 2020-09-05 ENCOUNTER — Other Ambulatory Visit: Payer: Self-pay | Admitting: Nurse Practitioner

## 2020-09-05 ENCOUNTER — Ambulatory Visit (HOSPITAL_COMMUNITY): Payer: Medicare Other | Admitting: Occupational Therapy

## 2020-09-05 DIAGNOSIS — R29898 Other symptoms and signs involving the musculoskeletal system: Secondary | ICD-10-CM

## 2020-09-05 DIAGNOSIS — M25512 Pain in left shoulder: Secondary | ICD-10-CM | POA: Diagnosis not present

## 2020-09-05 DIAGNOSIS — M25612 Stiffness of left shoulder, not elsewhere classified: Secondary | ICD-10-CM

## 2020-09-05 NOTE — Therapy (Signed)
[facility], [place], [address] Phone: [phone]   Fax:  [phone]  Occupational Therapy Treatment  Patient Details  Name: Stacy Webb MRN: [id number] Date of Birth: 14-Feb-1946 Referring Provider (OT): Dr. Larena Glassman   Encounter Date: 09/05/2020   OT End of Session - 09/05/20 1111    Visit Number 13    Number of Visits 16    Date for OT Re-Evaluation 09/23/20    Authorization Type UHC Medicare, $30 copay    Authorization Time Period no visit limit    Progress Note Due on Visit 17    OT Start Time 1031    OT Stop Time 1111    OT Time Calculation (min) 40 min    Activity Tolerance Patient tolerated treatment well    Behavior During Therapy [facility] for tasks assessed/performed           Past Medical History:  Diagnosis Date  . Anemia   . Anxiety   . Arthritis    fingers  . 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)   . Diabetes mellitus without complication ([place])   . Excessive hair growth 07/19/2015  . Heart murmur   . History of kidney stones   . Hyperlipidemia   . Hypertension   . Neuromuscular disorder ([place])    diabetic neuropathy  . Neuropathy   . Postmenopausal 07/19/2015    Past Surgical History:  Procedure Laterality Date  . ANTERIOR AND POSTERIOR REPAIR N/A 04/28/2018   Procedure: ANTERIOR (CYSTOCELE) AND POSTERIOR REPAIR (RECTOCELE);  Surgeon: Jonnie Kind, MD;  Location: [facility];  Service: Gynecology;  Laterality: N/A;  . APPENDECTOMY    . CARPAL TUNNEL RELEASE Bilateral 2001  . CESAREAN SECTION    . COLONOSCOPY N/A 08/28/2012   Procedure: COLONOSCOPY;  Surgeon: Rogene Houston, MD;  Location: [facility];  Service: Endoscopy;  Laterality: N/A;  915  . COLONOSCOPY N/A 12/25/2017   Procedure: COLONOSCOPY;  Surgeon: Rogene Houston, MD;  Location: [facility];  Service: Endoscopy;  Laterality: N/A;  12:45  . REVERSE SHOULDER ARTHROPLASTY Left 07/10/2020   Procedure: REVERSE  SHOULDER ARTHROPLASTY;  Surgeon: [name], MD;  Location: [facility] ORS;  Service: Orthopedics;  Laterality: Left;    There were no vitals filed for this visit.   Subjective Assessment - 09/05/20 1031    Subjective  S: It's feeling ok.    Currently in Pain? No/denies              [facility] OT Assessment - 09/05/20 1030      Assessment   Medical Diagnosis s/p left reverse TSA      Precautions   Precautions Shoulder    Type of Shoulder Precautions See protocol                    OT Treatments/Exercises (OP) - 09/05/20 1033      Exercises   Exercises Shoulder      Shoulder Exercises: Supine   Protraction PROM;5 reps;AROM;12 reps    Horizontal ABduction PROM;5 reps;AROM;12 reps    External Rotation PROM;5 reps;AROM;12 reps    Internal Rotation PROM;5 reps;AROM;12 reps    Flexion PROM;5 reps;AROM;12 reps    ABduction PROM;5 reps;AROM;12 reps      Shoulder Exercises: Standing   Protraction AROM;10 reps    Horizontal ABduction AROM;10 reps    External Rotation AROM;10 reps    Flexion AROM;10 reps    ABduction AROM;10  reps      Shoulder Exercises: ROM/Strengthening   UBE (Upper Arm Bike) Level 1 2' foward 2' reverse, pace: 5.0    Proximal Shoulder Strengthening, Supine 10X each, no rest breaks      Functional Reaching Activities   Mid Level Pt placing red clothespin on low end of vertical pole of pinch tree.      Manual Therapy   Manual Therapy Myofascial release    Manual therapy comments manual therapy completed seperately from all other interventions this date of service    Myofascial Release myofascial release and manual stretching to left upper arm, scapular, shoulder region to decrease pain and restrictions and improve pain free mobility in her left shoulder per protcol.                    OT Short Term Goals - 08/15/20 1203      OT SHORT TERM GOAL #1   Title Patient will be educated and demonstrate understanding of HEP in order to increase  functional use of her LUE during basic daily tasks while increasing ROM and strength.    Time 4    Period Weeks    Status On-going    Target Date 08/24/20      OT SHORT TERM GOAL #2   Title Pt will increase LUE P/ROM to [facility] to improve ability to complete dressing tasks with minimal compensatory strategies.    Time 4    Period Weeks    Status On-going      OT SHORT TERM GOAL #3   Title Pt will increase LUE strength to 3+/5 to improve ability to reach items at waist to chest height during ADLs.    Time 4    Period Weeks    Status On-going             OT Long Term Goals - 08/01/20 1021      OT LONG TERM GOAL #1   Title Pt will decrease LUE pain to 3/10 or less to improve ability to use LUE as assist during ADLs and leisure tasks.    Time 8    Period Weeks    Status On-going      OT LONG TERM GOAL #2   Title Pt will decrease LUE fascial restrictions to minimal amounts to improve mobility required for functional reaching tasks.    Time 8    Period Weeks    Status On-going      OT LONG TERM GOAL #3   Title Pt will increase LUE A/ROM to [facility] to improve ability to perform functional reaching overhead and behind back during dressing and bathing tasks.    Time 8    Period Weeks    Status On-going      OT LONG TERM GOAL #4   Title Pt will increase LUE strength to 4+/5 or greater to improve ability to perform housekeeping tasks using LUE as non-dominant.    Time 8    Period Weeks    Status On-going                 Plan - 09/05/20 1055    Clinical Impression Statement A: Continued with myofascial release and passive stretching, A/ROM in supine and standing. While in standing pt able to reach approximately 50% ROM with both AA/ROM and A/ROM, therefore progressed to A/ROM for strengthening. Pt completing pinch tree functional reaching activity, max difficulty keeping straight posture during reaching. Verbal cuing for form and technique during  exercises.    Body Structure /  Function / Physical Skills ADL;Endurance;UE functional use;Fascial restriction;Muscle spasms;Pain;ROM;IADL;Strength;Edema;Mobility    Plan P: Continue with A/ROM, functional reaching, proximal shoulder strengthening on doorway    OT Home Exercise Plan 5/12: standing AA/ROM           Patient will benefit from skilled therapeutic intervention in order to improve the following deficits and impairments:   Body Structure / Function / Physical Skills: ADL,Endurance,UE functional use,Fascial restriction,Muscle spasms,Pain,ROM,IADL,Strength,Edema,Mobility       Visit Diagnosis: Stiffness of left shoulder, not elsewhere classified  Acute pain of left shoulder  Other symptoms and signs involving the musculoskeletal system    Problem List Patient Active Problem List   Diagnosis Date Noted  . Rotator cuff arthropathy of left shoulder 07/10/2020  . Absolute anemia 10/30/2017  . Constipation 06/10/2017  . Hemorrhoids 06/10/2017  . Cystocele with rectocele 06/10/2017  . Rectocele 06/10/2017  . Diabetic foot ulcer ([place]) 09/27/2016  . CKD (chronic kidney disease), stage III ([place]) 09/27/2016  . 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[place]) 09/27/2016  . Hyperlipidemia 09/27/2016  . Hypertension 09/27/2016  . Diabetic ulcer of foot associated with diabetes mellitus due to underlying condition, limited to breakdown of skin ([place]) 09/27/2016  . Postmenopausal 07/19/2015  . Essential hypertension, benign 08/11/2012  . High cholesterol 08/11/2012  . Family hx of colon cancer 08/11/2012  . Diabetes [facility]) 08/11/2012   Guadelupe Sabin, OTR/L  [phone] 09/05/2020, 11:13 AM  [facility] [address] [place], [place], [address] Phone: [phone]   Fax:  [phone]  Name: Stacy Webb MRN: [id number] Date of Birth: 1945-09-17

## 2020-09-07 ENCOUNTER — Ambulatory Visit (HOSPITAL_COMMUNITY): Payer: Medicare Other | Attending: Orthopedic Surgery | Admitting: Occupational Therapy

## 2020-09-07 ENCOUNTER — Encounter (HOSPITAL_COMMUNITY): Payer: Self-pay | Admitting: Occupational Therapy

## 2020-09-07 ENCOUNTER — Other Ambulatory Visit: Payer: Self-pay

## 2020-09-07 DIAGNOSIS — M25611 Stiffness of right shoulder, not elsewhere classified: Secondary | ICD-10-CM | POA: Diagnosis present

## 2020-09-07 DIAGNOSIS — M25512 Pain in left shoulder: Secondary | ICD-10-CM | POA: Diagnosis present

## 2020-09-07 DIAGNOSIS — M25612 Stiffness of left shoulder, not elsewhere classified: Secondary | ICD-10-CM

## 2020-09-07 DIAGNOSIS — R29898 Other symptoms and signs involving the musculoskeletal system: Secondary | ICD-10-CM | POA: Diagnosis present

## 2020-09-07 NOTE — Therapy (Signed)
[place], [place], [address] Phone: [phone]   Fax:  [phone]  Occupational Therapy Treatment  Patient Details  Name: Stacy Webb MRN: [id number] Date of Birth: February 24, 1946 Referring Provider (OT): Dr. Larena Glassman   Encounter Date: 09/07/2020   OT End of Session - 09/07/20 1022    Visit Number 14    Number of Visits 16    Date for OT Re-Evaluation 09/23/20    Authorization Type UHC Medicare, $30 copay    Authorization Time Period no visit limit    Progress Note Due on Visit 17    OT Start Time 0945    OT Stop Time 1026    OT Time Calculation (min) 41 min    Activity Tolerance Patient tolerated treatment well    Behavior During Therapy [facility] for tasks assessed/performed           Past Medical History:  Diagnosis Date  . Anemia   . Anxiety   . Arthritis    fingers  . 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[place])   . Diabetes mellitus without complication ([place])   . Excessive hair growth 07/19/2015  . Heart murmur   . History of kidney stones   . Hyperlipidemia   . Hypertension   . Neuromuscular disorder ([place])    diabetic neuropathy  . Neuropathy   . Postmenopausal 07/19/2015    Past Surgical History:  Procedure Laterality Date  . ANTERIOR AND POSTERIOR REPAIR N/A 04/28/2018   Procedure: ANTERIOR (CYSTOCELE) AND POSTERIOR REPAIR (RECTOCELE);  Surgeon: Jonnie Kind, MD;  Location: [facility]S;  Service: Gynecology;  Laterality: N/A;  . APPENDECTOMY    . CARPAL TUNNEL RELEASE Bilateral 2001  . CESAREAN SECTION    . COLONOSCOPY N/A 08/28/2012   Procedure: COLONOSCOPY;  Surgeon: Rogene Houston, MD;  Location: [facility];  Service: Endoscopy;  Laterality: N/A;  915  . COLONOSCOPY N/A 12/25/2017   Procedure: COLONOSCOPY;  Surgeon: Rogene Houston, MD;  Location: [facility];  Service: Endoscopy;  Laterality: N/A;  12:45  . REVERSE SHOULDER ARTHROPLASTY Left 07/10/2020   Procedure: REVERSE SHOULDER  ARTHROPLASTY;  Surgeon: [name], MD;  Location: [facility]S;  Service: Orthopedics;  Laterality: Left;    There were no vitals filed for this visit.   Subjective Assessment - 09/07/20 0946    Subjective  S: My right arm was actually a little sore when I woke up this morning.    Currently in Pain? No/denies              [facility] OT Assessment - 09/07/20 0946      Assessment   Medical Diagnosis s/p left reverse TSA      Precautions   Precautions Shoulder    Type of Shoulder Precautions See protocol                    OT Treatments/Exercises (OP) - 09/07/20 0947      Exercises   Exercises Shoulder      Shoulder Exercises: Supine   Protraction PROM;5 reps;AROM;12 reps    Horizontal ABduction PROM;5 reps;AROM;12 reps    External Rotation PROM;5 reps;AROM;12 reps    Internal Rotation PROM;5 reps;AROM;12 reps    Flexion PROM;5 reps;AROM;12 reps    ABduction PROM;5 reps;AROM;12 reps      Shoulder Exercises: Standing   Protraction AROM;10 reps    Horizontal ABduction AROM;10 reps    External Rotation AROM;10 reps  Flexion AROM;10 reps    ABduction AROM;10 reps    Extension Theraband;10 reps    Theraband Level (Shoulder Extension) Level 2 (Red)    Row Theraband;10 reps    Theraband Level (Shoulder Row) Level 2 (Red)      Shoulder Exercises: ROM/Strengthening   UBE (Upper Arm Bike) Level 1 3' foward 3' reverse, pace: 4.5    Proximal Shoulder Strengthening, Supine 12X each, no rest breaks    Other ROM/Strengthening Exercises proximal shoulder strengthening on doorway, 1' flexion using washcloth      Functional Reaching Activities   Mid Level Pt removing and replacing items on middle shelf of overhead cabinet in ADL kitchen. Min difficulty removing, mod difficulty replacing      Manual Therapy   Manual Therapy Myofascial release    Manual therapy comments manual therapy completed seperately from all other interventions this date of service    Myofascial  Release myofascial release and manual stretching to left upper arm, scapular, shoulder region to decrease pain and restrictions and improve pain free mobility in her left shoulder per protcol.                    OT Short Term Goals - 08/15/20 1203      OT SHORT TERM GOAL #1   Title Patient will be educated and demonstrate understanding of HEP in order to increase functional use of her LUE during basic daily tasks while increasing ROM and strength.    Time 4    Period Weeks    Status On-going    Target Date 08/24/20      OT SHORT TERM GOAL #2   Title Pt will increase LUE P/ROM to [facility] to improve ability to complete dressing tasks with minimal compensatory strategies.    Time 4    Period Weeks    Status On-going      OT SHORT TERM GOAL #3   Title Pt will increase LUE strength to 3+/5 to improve ability to reach items at waist to chest height during ADLs.    Time 4    Period Weeks    Status On-going             OT Long Term Goals - 08/01/20 1021      OT LONG TERM GOAL #1   Title Pt will decrease LUE pain to 3/10 or less to improve ability to use LUE as assist during ADLs and leisure tasks.    Time 8    Period Weeks    Status On-going      OT LONG TERM GOAL #2   Title Pt will decrease LUE fascial restrictions to minimal amounts to improve mobility required for functional reaching tasks.    Time 8    Period Weeks    Status On-going      OT LONG TERM GOAL #3   Title Pt will increase LUE A/ROM to [facility] to improve ability to perform functional reaching overhead and behind back during dressing and bathing tasks.    Time 8    Period Weeks    Status On-going      OT LONG TERM GOAL #4   Title Pt will increase LUE strength to 4+/5 or greater to improve ability to perform housekeeping tasks using LUE as non-dominant.    Time 8    Period Weeks    Status On-going                 Plan - 09/07/20  1020    Clinical Impression Statement A: Continued with manual  techniques to address fascial restrictions, passive stretching. Pt completing A/ROM in supine and standing, improvement in activity tolerance noted today. Added functional reaching task, pt able to reach middle shelf, when replacing items on shelf pt turning items on their side to keep her arm in pronated position, then turned item up once it was on the shelf. Rest breaks provided as needed during session. Verbal cuing for form and technique.    Body Structure / Function / Physical Skills ADL;Endurance;UE functional use;Fascial restriction;Muscle spasms;Pain;ROM;IADL;Strength;Edema;Mobility    Plan P: functional reaching, shoulder strengthening and stability work, update HEP    OT Home Exercise Plan 5/12: standing AA/ROM    Consulted and Agree with Plan of Care Patient           Patient will benefit from skilled therapeutic intervention in order to improve the following deficits and impairments:   Body Structure / Function / Physical Skills: ADL,Endurance,UE functional use,Fascial restriction,Muscle spasms,Pain,ROM,IADL,Strength,Edema,Mobility       Visit Diagnosis: Stiffness of left shoulder, not elsewhere classified  Acute pain of left shoulder  Other symptoms and signs involving the musculoskeletal system    Problem List Patient Active Problem List   Diagnosis Date Noted  . Rotator cuff arthropathy of left shoulder 07/10/2020  . Absolute anemia 10/30/2017  . Constipation 06/10/2017  . Hemorrhoids 06/10/2017  . Cystocele with rectocele 06/10/2017  . Rectocele 06/10/2017  . Diabetic foot ulcer ([place]) 09/27/2016  . CKD (chronic kidney disease), stage III ([place]) 09/27/2016  . 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[place]) 09/27/2016  . Hyperlipidemia 09/27/2016  . Hypertension 09/27/2016  . Diabetic ulcer of foot associated with diabetes mellitus due to underlying condition, limited to breakdown of skin ([place]) 09/27/2016  . Postmenopausal 07/19/2015  . Essential hypertension, benign  08/11/2012  . High cholesterol 08/11/2012  . Family hx of colon cancer 08/11/2012  . Diabetes [facility]) 08/11/2012   Guadelupe Sabin, OTR/L  [phone] 09/07/2020, 10:27 AM  [place], [place], [address] Phone: [phone]   Fax:  [phone]  Name: Stacy Webb MRN: [id number] Date of Birth: 06-10-1945

## 2020-09-12 ENCOUNTER — Encounter (HOSPITAL_COMMUNITY): Payer: Self-pay

## 2020-09-12 ENCOUNTER — Other Ambulatory Visit: Payer: Self-pay

## 2020-09-12 ENCOUNTER — Ambulatory Visit (HOSPITAL_COMMUNITY): Payer: Medicare Other

## 2020-09-12 DIAGNOSIS — M25612 Stiffness of left shoulder, not elsewhere classified: Secondary | ICD-10-CM

## 2020-09-12 DIAGNOSIS — R29898 Other symptoms and signs involving the musculoskeletal system: Secondary | ICD-10-CM

## 2020-09-12 DIAGNOSIS — M25512 Pain in left shoulder: Secondary | ICD-10-CM

## 2020-09-12 NOTE — Therapy (Signed)
[place], [place], [address] Phone: [phone]   Fax:  [phone]  Occupational Therapy Treatment  Patient Details  Name: Stacy Webb MRN: [id number] Date of Birth: [birth year] Referring Provider (OT): Dr. Larena Glassman   Encounter Date: 09/12/2020   OT End of Session - 09/12/20 1508    Visit Number 15    Number of Visits 16    Date for OT Re-Evaluation 09/23/20    Authorization Type UHC Medicare, $30 copay    Authorization Time Period no visit limit    Progress Note Due on Visit 17    OT Start Time 1430    OT Stop Time 1508    OT Time Calculation (min) 38 min    Activity Tolerance Patient tolerated treatment well    Behavior During Therapy [facility] for tasks assessed/performed           Past Medical History:  Diagnosis Date  . Anemia   . Anxiety   . Arthritis    fingers  . 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[place])   . Diabetes mellitus without complication ([place])   . Excessive hair growth 07/19/2015  . Heart murmur   . History of kidney stones   . Hyperlipidemia   . Hypertension   . Neuromuscular disorder ([place])    diabetic neuropathy  . Neuropathy   . Postmenopausal 07/19/2015    Past Surgical History:  Procedure Laterality Date  . ANTERIOR AND POSTERIOR REPAIR N/A 04/28/2018   Procedure: ANTERIOR (CYSTOCELE) AND POSTERIOR REPAIR (RECTOCELE);  Surgeon: Jonnie Kind, MD;  Location: [facility];  Service: Gynecology;  Laterality: N/A;  . APPENDECTOMY    . CARPAL TUNNEL RELEASE Bilateral 2001  . CESAREAN SECTION    . COLONOSCOPY N/A 08/28/2012   Procedure: COLONOSCOPY;  Surgeon: Rogene Houston, MD;  Location: [facility];  Service: Endoscopy;  Laterality: N/A;  915  . COLONOSCOPY N/A 12/25/2017   Procedure: COLONOSCOPY;  Surgeon: Rogene Houston, MD;  Location: [facility];  Service: Endoscopy;  Laterality: N/A;  12:45  . REVERSE SHOULDER ARTHROPLASTY Left 07/10/2020   Procedure: REVERSE SHOULDER  ARTHROPLASTY;  Surgeon: [name], MD;  Location: [facility];  Service: Orthopedics;  Laterality: Left;    There were no vitals filed for this visit.   Subjective Assessment - 09/12/20 1448    Subjective  S: My right arm is getting sore. I think it's because I'm using it so much. I'm nervous to use my left arm.    Currently in Pain? No/denies              [facility] OT Assessment - 09/12/20 1448      Assessment   Medical Diagnosis s/p left reverse TSA      Precautions   Precautions Shoulder    Type of Shoulder Precautions See protocol                    OT Treatments/Exercises (OP) - 09/12/20 1449      Exercises   Exercises Shoulder      Shoulder Exercises: Supine   Protraction PROM;5 reps;AROM;15 reps    Horizontal ABduction PROM;5 reps;AROM;15 reps    External Rotation PROM;5 reps;AROM;15 reps    Internal Rotation PROM;5 reps;AROM;15 reps    Flexion PROM;5 reps;AROM;15 reps    ABduction PROM;5 reps;AROM;15 reps      Shoulder Exercises: Standing   Protraction AROM;10 reps    Horizontal ABduction AROM;10  reps    External Rotation AROM;12 reps    Internal Rotation AROM;12 reps    Flexion AROM;10 reps    ABduction AROM;10 reps      Functional Reaching Activities   Mid Level Completed mid level reaching using Squigz on door. Focused on form and technique while eliminating lumbar extension and shoulder abduction. Completed with arm extended at shoulder leve during flexion as well as elbow flexed with elbow pointing down.      Manual Therapy   Manual Therapy Myofascial release    Manual therapy comments manual therapy completed seperately from all other interventions this date of service    Myofascial Release myofascial release and manual stretching to left upper arm, scapular, shoulder region to decrease pain and restrictions and improve pain free mobility in her left shoulder per protcol.                  OT Education - 09/12/20 1458    Education  Details standing A/ROM shoulder exercises    Person(s) Educated Patient    Methods Explanation;Verbal cues;Handout;Demonstration    Comprehension Verbalized understanding;Returned demonstration            OT Short Term Goals - 08/15/20 1203      OT SHORT TERM GOAL #1   Title Patient will be educated and demonstrate understanding of HEP in order to increase functional use of her LUE during basic daily tasks while increasing ROM and strength.    Time 4    Period Weeks    Status On-going    Target Date 08/24/20      OT SHORT TERM GOAL #2   Title Pt will increase LUE P/ROM to [facility] to improve ability to complete dressing tasks with minimal compensatory strategies.    Time 4    Period Weeks    Status On-going      OT SHORT TERM GOAL #3   Title Pt will increase LUE strength to 3+/5 to improve ability to reach items at waist to chest height during ADLs.    Time 4    Period Weeks    Status On-going             OT Long Term Goals - 08/01/20 1021      OT LONG TERM GOAL #1   Title Pt will decrease LUE pain to 3/10 or less to improve ability to use LUE as assist during ADLs and leisure tasks.    Time 8    Period Weeks    Status On-going      OT LONG TERM GOAL #2   Title Pt will decrease LUE fascial restrictions to minimal amounts to improve mobility required for functional reaching tasks.    Time 8    Period Weeks    Status On-going      OT LONG TERM GOAL #3   Title Pt will increase LUE A/ROM to [facility] to improve ability to perform functional reaching overhead and behind back during dressing and bathing tasks.    Time 8    Period Weeks    Status On-going      OT LONG TERM GOAL #4   Title Pt will increase LUE strength to 4+/5 or greater to improve ability to perform housekeeping tasks using LUE as non-dominant.    Time 8    Period Weeks    Status On-going                 Plan - 09/12/20 1611  Clinical Impression Statement A: COmpleted manual techniques to left  UE to focus on fascial restrictions. patient with minimal restrictions palpated this date. Able to increase supine A/ROM shoulder exercises to 15. When standing, continued with A/ROM while updating HEP. Completed functional reaching task using Squigz while providing VC for form and technique. Alternated between reaching into shoulder flexion with an extended arm and then reaching up door with elbow flexed and pointed to the ground. Max difficulty during reaching task and completed with increased time.    Body Structure / Function / Physical Skills ADL;Endurance;UE functional use;Fascial restriction;Muscle spasms;Pain;ROM;IADL;Strength;Edema;Mobility    Plan P: Follow up on HEP. Resume scapular strengthening and continue to work on functional reaching.    OT Home Exercise Plan 5/12: standing AA/ROM 6/7: A/ROM    Consulted and Agree with Plan of Care Patient           Patient will benefit from skilled therapeutic intervention in order to improve the following deficits and impairments:   Body Structure / Function / Physical Skills: ADL,Endurance,UE functional use,Fascial restriction,Muscle spasms,Pain,ROM,IADL,Strength,Edema,Mobility       Visit Diagnosis: Stiffness of left shoulder, not elsewhere classified  Acute pain of left shoulder  Other symptoms and signs involving the musculoskeletal system    Problem List Patient Active Problem List   Diagnosis Date Noted  . Rotator cuff arthropathy of left shoulder 07/10/2020  . Absolute anemia 10/30/2017  . Constipation 06/10/2017  . Hemorrhoids 06/10/2017  . Cystocele with rectocele 06/10/2017  . Rectocele 06/10/2017  . Diabetic foot ulcer ([place]) 09/27/2016  . CKD (chronic kidney disease), stage III ([place]) 09/27/2016  . 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[place]) 09/27/2016  . Hyperlipidemia 09/27/2016  . Hypertension 09/27/2016  . Diabetic ulcer of foot associated with diabetes mellitus due to underlying condition, limited to breakdown  of skin ([place]) 09/27/2016  . Postmenopausal 07/19/2015  . Essential hypertension, benign 08/11/2012  . High cholesterol 08/11/2012  . Family hx of colon cancer 08/11/2012  . Diabetes [facility]) 08/11/2012    Ailene Ravel, OTR/L,CBIS  [phone]  09/12/2020, 4:15 PM  [place] [address] [place], [place], [address] Phone: [phone]   Fax:  [phone]  Name: Stacy Webb MRN: [id number] Date of Birth: October 23, 1945

## 2020-09-12 NOTE — Patient Instructions (Signed)

## 2020-09-14 ENCOUNTER — Encounter (HOSPITAL_COMMUNITY): Payer: Self-pay

## 2020-09-14 ENCOUNTER — Ambulatory Visit (HOSPITAL_COMMUNITY): Payer: Medicare Other

## 2020-09-14 ENCOUNTER — Other Ambulatory Visit: Payer: Self-pay

## 2020-09-14 DIAGNOSIS — M25612 Stiffness of left shoulder, not elsewhere classified: Secondary | ICD-10-CM | POA: Diagnosis not present

## 2020-09-14 DIAGNOSIS — R29898 Other symptoms and signs involving the musculoskeletal system: Secondary | ICD-10-CM

## 2020-09-14 DIAGNOSIS — M25611 Stiffness of right shoulder, not elsewhere classified: Secondary | ICD-10-CM

## 2020-09-14 DIAGNOSIS — M25512 Pain in left shoulder: Secondary | ICD-10-CM

## 2020-09-14 NOTE — Therapy (Signed)
[place], [place], [address] Phone: [phone]   Fax:  [phone]  Occupational Therapy Treatment  Patient Details  Name: Stacy Webb MRN: [id number] Date of Birth: Jun 09, 1945 Referring Provider (OT): Dr. Larena Glassman   Encounter Date: 09/14/2020   OT End of Session - 09/14/20 1511     Visit Number 16    Number of Visits 17    Date for OT Re-Evaluation 09/23/20    Authorization Type UHC Medicare, $30 copay    Authorization Time Period no visit limit    Progress Note Due on Visit 17    OT Start Time 1430    OT Stop Time 1510    OT Time Calculation (min) 40 min    Activity Tolerance Patient tolerated treatment well    Behavior During Therapy [facility] for tasks assessed/performed             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[place])    Diabetes mellitus without complication ([place])    Excessive hair growth 07/19/2015   Heart murmur    History of kidney stones    Hyperlipidemia    Hypertension    Neuromuscular disorder ([place])    diabetic neuropathy   Neuropathy    Postmenopausal 07/19/2015    Past Surgical History:  Procedure Laterality Date   ANTERIOR AND POSTERIOR REPAIR N/A 04/28/2018   Procedure: ANTERIOR (CYSTOCELE) AND POSTERIOR REPAIR (RECTOCELE);  Surgeon: Jonnie Kind, MD;  Location: [facility];  Service: Gynecology;  Laterality: N/A;   APPENDECTOMY     CARPAL TUNNEL RELEASE Bilateral 2001   CESAREAN SECTION     COLONOSCOPY N/A 08/28/2012   Procedure: COLONOSCOPY;  Surgeon: Rogene Houston, MD;  Location: [facility];  Service: Endoscopy;  Laterality: N/A;  915   COLONOSCOPY N/A 12/25/2017   Procedure: COLONOSCOPY;  Surgeon: Rogene Houston, MD;  [facility]: [facility];  Service: Endoscopy;  Laterality: N/A;  12:45   REVERSE SHOULDER ARTHROPLASTY Left 07/10/2020   Procedure: REVERSE SHOULDER ARTHROPLASTY;   Surgeon: [name], MD;  Location: [facility];  Service: Orthopedics;  Laterality: Left;    There were no vitals filed for this visit.       [facility] OT Assessment - 09/14/20 1452       Assessment   Medical Diagnosis s/p left reverse TSA      Precautions   Precautions Shoulder    Type of Shoulder Precautions See protocol                      OT Treatments/Exercises (OP) - 09/14/20 1451       Exercises   Exercises Shoulder      Shoulder Exercises: Supine   Protraction PROM;5 reps    Horizontal ABduction PROM;5 reps;AROM;15 reps    External Rotation PROM;5 reps;AROM;15 reps    Internal Rotation PROM;5 reps;AROM;15 reps    Flexion PROM;5 reps;AROM;15 reps    ABduction PROM;5 reps;AROM;15 reps      Shoulder Exercises: Standing   Protraction AROM;10 reps    Horizontal ABduction AROM;10 reps    External Rotation AROM;12 reps    Internal Rotation AROM;12 reps    Flexion AROM;10 reps    ABduction AROM;10 reps    Extension Theraband;10 reps    Theraband Level (Shoulder Extension) Level 2 (Red)  Row Theraband;10 reps    Theraband Level (Shoulder Row) Level 2 (Red)      Shoulder Exercises: ROM/Strengthening   UBE (Upper Arm Bike) Level 1 3' foward 3' reverse, pace: 4.5   pace: 4.5-5.5   Over Head Lace seated; pt started to lace chain at highest level to the bottom. Unlaced when she reached the bottom.      Manual Therapy   Manual Therapy Myofascial release    Manual therapy comments manual therapy completed seperately from all other interventions this date of service    Myofascial Release myofascial release and manual stretching to left upper arm, scapular, shoulder region to decrease pain and restrictions and improve pain free mobility in her left shoulder per protcol.                      OT Short Term Goals - 08/15/20 1203       OT SHORT TERM GOAL #1   Title Patient will be educated and demonstrate understanding of HEP in order to increase  functional use of her LUE during basic daily tasks while increasing ROM and strength.    Time 4    Period Weeks    Status On-going    Target Date 08/24/20      OT SHORT TERM GOAL #2   Title Pt will increase LUE P/ROM to [facility] to improve ability to complete dressing tasks with minimal compensatory strategies.    Time 4    Period Weeks    Status On-going      OT SHORT TERM GOAL #3   Title Pt will increase LUE strength to 3+/5 to improve ability to reach items at waist to chest height during ADLs.    Time 4    Period Weeks    Status On-going               OT Long Term Goals - 08/01/20 1021       OT LONG TERM GOAL #1   Title Pt will decrease LUE pain to 3/10 or less to improve ability to use LUE as assist during ADLs and leisure tasks.    Time 8    Period Weeks    Status On-going      OT LONG TERM GOAL #2   Title Pt will decrease LUE fascial restrictions to minimal amounts to improve mobility required for functional reaching tasks.    Time 8    Period Weeks    Status On-going      OT LONG TERM GOAL #3   Title Pt will increase LUE A/ROM to [facility] to improve ability to perform functional reaching overhead and behind back during dressing and bathing tasks.    Time 8    Period Weeks    Status On-going      OT LONG TERM GOAL #4   Title Pt will increase LUE strength to 4+/5 or greater to improve ability to perform housekeeping tasks using LUE as non-dominant.    Time 8    Period Weeks    Status On-going                   Plan - 09/14/20 1523     Clinical Impression Statement A: manual techniques completed to left Upper arm, and upper trapezius to address fascial restrictions. patient with trace fascial restrictions at this session. Continues to be limited with passive and active ROM. Unable to tolerate ROM past 90 degrees due to pain. VC for  form and technique were provided during session. Patient demonstrates that compensatory movement patterns when attempting to  reach an object at shoulder level. Patient will demonstrate a lateral lean to the right while abducting her elbow. Difficulty sefl correcting when VC are provided. Changed UBE handles to a nuetral grip to focus on a functional elbow position and shouldre adducted versus abducted. Recommended working on reaching at home with thumb up to help decrease shoulder abduction.    Body Structure / Function / Physical Skills ADL;Endurance;UE functional use;Fascial restriction;Muscle spasms;Pain;ROM;IADL;Strength;Edema;Mobility    Plan P: reassessment/progress note. Determine if patient is ready for discharge or re-cert if needing to continue.    Consulted and Agree with Plan of Care Patient             Patient will benefit from skilled therapeutic intervention in order to improve the following deficits and impairments:   Body Structure / Function / Physical Skills: ADL, Endurance, UE functional use, Fascial restriction, Muscle spasms, Pain, ROM, IADL, Strength, Edema, Mobility       Visit Diagnosis: Acute pain of left shoulder  Stiffness of left shoulder, not elsewhere classified  Other symptoms and signs involving the musculoskeletal system  Stiffness of right shoulder, not elsewhere classified    Problem List Patient Active Problem List   Diagnosis Date Noted   Rotator cuff arthropathy of left shoulder 07/10/2020   Absolute anemia 10/30/2017   Constipation 06/10/2017   Hemorrhoids 06/10/2017   Cystocele with rectocele 06/10/2017   Rectocele 06/10/2017   Diabetic foot ulcer ([place]) 09/27/2016   CKD (chronic kidney disease), stage III ([place]) 09/27/2016   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[place]) 09/27/2016   Hyperlipidemia 09/27/2016   Hypertension 09/27/2016   Diabetic ulcer of foot associated with diabetes mellitus due to underlying condition, limited to breakdown of skin ([place]) 09/27/2016   Postmenopausal 07/19/2015   Essential hypertension, benign 08/11/2012   High cholesterol  08/11/2012   Family hx of colon cancer 08/11/2012   Diabetes ([place]) 08/11/2012    Ailene Ravel, OTR/L,CBIS  [phone]   09/14/2020, 3:28 PM  [place], [place], [address] Phone: [phone]   Fax:  [phone]  Name: Stacy Webb MRN: [id number] Date of Birth: May 09, 1945

## 2020-09-19 ENCOUNTER — Encounter (HOSPITAL_COMMUNITY): Payer: Self-pay | Admitting: Occupational Therapy

## 2020-09-19 ENCOUNTER — Encounter: Payer: Self-pay | Admitting: Nurse Practitioner

## 2020-09-19 ENCOUNTER — Ambulatory Visit (HOSPITAL_COMMUNITY): Payer: Medicare Other | Admitting: Occupational Therapy

## 2020-09-19 ENCOUNTER — Other Ambulatory Visit: Payer: Self-pay

## 2020-09-19 ENCOUNTER — Ambulatory Visit (INDEPENDENT_AMBULATORY_CARE_PROVIDER_SITE_OTHER): Payer: Medicare Other | Admitting: Nurse Practitioner

## 2020-09-19 VITALS — BP 117/66 | HR 82 | Temp 98.9°F | Resp 20 | Ht 65.0 in | Wt 130.0 lb

## 2020-09-19 DIAGNOSIS — I10 Essential (primary) hypertension: Secondary | ICD-10-CM

## 2020-09-19 DIAGNOSIS — E11610 Type 2 diabetes mellitus with diabetic neuropathic arthropathy: Secondary | ICD-10-CM

## 2020-09-19 DIAGNOSIS — Z7689 Persons encountering health services in other specified circumstances: Secondary | ICD-10-CM | POA: Diagnosis not present

## 2020-09-19 DIAGNOSIS — R29898 Other symptoms and signs involving the musculoskeletal system: Secondary | ICD-10-CM

## 2020-09-19 DIAGNOSIS — N183 Chronic kidney disease, stage 3 unspecified: Secondary | ICD-10-CM

## 2020-09-19 DIAGNOSIS — M12812 Other specific arthropathies, not elsewhere classified, left shoulder: Secondary | ICD-10-CM

## 2020-09-19 DIAGNOSIS — Z139 Encounter for screening, unspecified: Secondary | ICD-10-CM

## 2020-09-19 DIAGNOSIS — E785 Hyperlipidemia, unspecified: Secondary | ICD-10-CM

## 2020-09-19 DIAGNOSIS — M25612 Stiffness of left shoulder, not elsewhere classified: Secondary | ICD-10-CM

## 2020-09-19 DIAGNOSIS — M25512 Pain in left shoulder: Secondary | ICD-10-CM

## 2020-09-19 MED ORDER — HYDROCHLOROTHIAZIDE 25 MG PO TABS
25.0000 mg | ORAL_TABLET | Freq: Every day | ORAL | 1 refills | Status: DC
Start: 2020-09-19 — End: 2021-01-22

## 2020-09-19 NOTE — Assessment & Plan Note (Signed)
-  followed by Dr. Amedeo Kinsman -going to PT/OT for left shoulder therapy

## 2020-09-19 NOTE — Assessment & Plan Note (Addendum)
Lab Results  Component Value Date   HGBA1C 6.5 (H) 07/07/2020  -will check labs again in 3 months -has Charcot deformity -on statin and ARB -goal A1c < 7

## 2020-09-19 NOTE — Progress Notes (Signed)
New Patient Office Visit  Subjective:  Patient ID: Stacy Webb, female    DOB: [birth year]  Age: 75 y.o. MRN: [id number]  CC:  Chief Complaint  Patient presents with   New Patient (Initial Visit)    HPI [name] presents for new patient visit. Transferring care from Dr. Karie Kirks. Last physical was within a year. Last labs was a couple of weeks ago.  She has been getting OT for left shoulder stiffness.  She had left shoulder replacement with Dr. Amedeo Kinsman in April 2022.  She has hx of DM, CKD-3, HTN, left rotator cuff injury, HLD, and constipation/hemorrhoids.  No acute concerns.   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[place])    Diabetes mellitus without complication (HCC)    Excessive hair growth 07/19/2015   Heart murmur    History of kidney stones    Hyperlipidemia    Hypertension    Neuromuscular disorder ([place])    diabetic neuropathy   Neuropathy    Postmenopausal 07/19/2015    Past Surgical History:  Procedure Laterality Date   ANTERIOR AND POSTERIOR REPAIR N/A 04/28/2018   Procedure: ANTERIOR (CYSTOCELE) AND POSTERIOR REPAIR (RECTOCELE);  Surgeon: Jonnie Kind, MD;  Location: [facility];  Service: Gynecology;  Laterality: N/A;   APPENDECTOMY     CARPAL TUNNEL RELEASE Bilateral 05/30/1999   CESAREAN SECTION     COLONOSCOPY N/A 08/28/2012   Procedure: COLONOSCOPY;  Surgeon: Rogene Houston, MD;  Location: [facility];  Service: Endoscopy;  Laterality: N/A;  915   COLONOSCOPY N/A 12/25/2017   Procedure: COLONOSCOPY;  Surgeon: Rogene Houston, MD;  Location: [facility];  Service: Endoscopy;  Laterality: N/A;  12:45   REVERSE SHOULDER ARTHROPLASTY Left 07/10/2020   Procedure: REVERSE SHOULDER ARTHROPLASTY;  Surgeon: [name], MD;  Location: [facility];  Service: Orthopedics;  Laterality: Left;    Family History  Problem Relation Age of Onset   Pneumonia Son    Suicidality  Son    Diabetes Daughter    Colon cancer Paternal Aunt     Social History   Socioeconomic History   Marital status: Widowed    Spouse name: Not on file   Number of children: 3   Years of education: Not on file   Highest education level: Not on file  Occupational History   Occupation: [employer]    Comment: Medical Records  Tobacco Use   Smoking status: Never   Smokeless tobacco: Never  Vaping Use   Vaping Use: Never used  Substance and Sexual Activity   Alcohol use: No   Drug use: No   Sexual activity: Not Currently    Birth control/protection: Post-menopausal  Other Topics Concern   Not on file  Social History Narrative   Husband had ALS and passed away.Twin boys, but one died at 59 months and her other son died in 2014-05-29.      Living daughter- lives with pt.   Social Determinants of Health   Financial Resource Strain: Not on file  Food Insecurity: Not on file  Transportation Needs: Not on file  Physical Activity: Not on file  Stress: Not on file  Social Connections: Not on file  Intimate Partner Violence: Not on file    ROS Review of Systems  Constitutional: Negative.   Respiratory: Negative.    Cardiovascular: Negative.   Musculoskeletal:  Positive for arthralgias.       Right shoulder pain; had left shoulder replacement and has been using her right shoulder for everything, and now it is sore; followed by Dr. Amedeo Kinsman  Psychiatric/Behavioral: Negative.         -increased stress -had twin boys, both are deceased; 1 in infancy and another as an adult -has living adult daughter who is in poor health and was recently in [place] with [place] truck and is recovering   Objective:   Today's Vitals: BP 117/66 (BP Location: Right Arm, Patient Position: Sitting, Cuff Size: Normal)   Pulse 82   Temp 98.9 F (37.2 C) (Oral)   Resp 20   Ht 5' 5" (1.651 m)   Wt 130 lb (59 kg)   SpO2 95%   BMI 21.63 kg/m   Physical Exam Constitutional:      Appearance: Normal appearance.   Cardiovascular:     Rate and Rhythm: Normal rate and regular rhythm.     Pulses: Normal pulses.     Heart sounds: Murmur heard.  Pulmonary:     Effort: Pulmonary effort is normal.     Breath sounds: Normal breath sounds.  Neurological:     Mental Status: She is alert.  Psychiatric:        Mood and Affect: Mood normal.        Behavior: Behavior normal.        Thought Content: Thought content normal.        Judgment: Judgment normal.    Assessment & Plan:   Problem List Items Addressed This Visit       Cardiovascular and Mediastinum   Essential hypertension, benign    BP Readings from Last 3 Encounters:  09/19/20 117/66  07/11/20 (!) 148/72  07/07/20 (!) 201/95  -BP well controlled today -takes diltiazem, HCTZ, and losartan -refilled HCTZ today        Relevant Medications   hydrochlorothiazide (HYDRODIURIL) 25 MG tablet     Endocrine   Diabetes (HCC)    Lab Results  Component Value Date   HGBA1C 6.5 (H) 07/07/2020  -will check labs again in 3 months -has Charcot deformity -on statin and ARB -goal A1c < 7        Relevant Orders   CBC with Differential/Platelet   CMP14+EGFR   Lipid Panel With LDL/HDL Ratio   Hemoglobin A1c   Microalbumin / creatinine urine ratio     Musculoskeletal and Integument   Rotator cuff arthropathy of left shoulder    -followed by Dr. Amedeo Kinsman -going to PT/OT for left shoulder therapy         Genitourinary   CKD (chronic kidney disease), stage III ([place])    -will check labs with next visit       Relevant Orders   CBC with Differential/Platelet   CMP14+EGFR     Other   Hyperlipidemia    -check labs with next visit       Relevant Medications   hydrochlorothiazide (HYDRODIURIL) 25 MG tablet   Other Relevant Orders   Lipid Panel With LDL/HDL Ratio   Encounter to establish care - Primary    -obtain records from Dr. Karie Kirks       Relevant Orders   CBC with Differential/Platelet   CMP14+EGFR   Lipid Panel With  LDL/HDL Ratio   Other Visit Diagnoses     Screening due       Relevant Orders   Hepatitis C antibody       Outpatient  Encounter Medications as of 09/19/2020  Medication Sig   ACCU-CHEK AVIVA PLUS test strip    atorvastatin (LIPITOR) 20 MG tablet Take 20 mg by mouth daily.   diltiazem (CARDIZEM CD) 360 MG 24 hr capsule Take 360 mg by mouth daily.    docusate sodium (COLACE) 100 MG capsule Take 200 mg by mouth at bedtime.   gabapentin (NEURONTIN) 100 MG capsule Take 100 mg by mouth 2 (two) times daily.    losartan (COZAAR) 50 MG tablet Take 50 mg by mouth daily.   metFORMIN (GLUCOPHAGE) 1000 MG tablet Take 1,000 mg by mouth 2 (two) times daily with a meal.   vitamin B-12 (CYANOCOBALAMIN) 500 MCG tablet Take 500 mcg by mouth 2 (two) times daily.    [DISCONTINUED] hydrochlorothiazide (HYDRODIURIL) 25 MG tablet Take 25 mg by mouth daily.    hydrochlorothiazide (HYDRODIURIL) 25 MG tablet Take 1 tablet (25 mg total) by mouth daily.   [DISCONTINUED] conjugated estrogens (PREMARIN) vaginal cream Place 5.30 Applicatorfuls vaginally 3 (three) times a week. For vaginal thinning and irritation in preop timeframe (Patient not taking: Reported on 06/21/2019)   [DISCONTINUED] ferrous sulfate 325 (65 FE) MG tablet Take 325 mg by mouth daily with breakfast.  (Patient not taking: Reported on 11/02/2019)   [DISCONTINUED] mupirocin ointment (BACTROBAN) 2 % Apply 1 application topically 2 (two) times daily.   [DISCONTINUED] polyethylene glycol powder (MIRALAX) powder Take 17 g by mouth daily. To prevent constipation   [DISCONTINUED] Zinc Gluconate 30 MG TABS Take 30 mg by mouth 3 (three) times a week. (Patient not taking: Reported on 09/19/2020)   No facility-administered encounter medications on file as of 09/19/2020.    Follow-up: Return in about 3 months (around 12/20/2020) for Lab follow-up (DM, HTN, HLD).   Noreene Larsson, NP

## 2020-09-19 NOTE — Therapy (Signed)
[facility] [place], [place], [address] Phone: [phone]   Fax:  [phone]  Occupational Therapy Reassessment, Treatment Recertifcation  Patient Details  Name: Stacy Webb MRN: [id number] Date of Birth: 12-27-1945 Referring Provider (OT): Dr. Larena Glassman   Progress Note Reporting Period 08/17/20 to 09/19/20  See note below for Objective Data and Assessment of Progress/Goals.      Encounter Date: 09/19/2020   OT End of Session - 09/19/20 1034     Visit Number 17    Number of Visits 21    Date for OT Re-Evaluation 10/19/20    Authorization Type [employer] Medicare, $30 copay    Authorization Time Period no visit limit    Progress Note Due on Visit 71    OT Start Time [phone]    OT Stop Time 1030    OT Time Calculation (min) 43 min    Activity Tolerance Patient tolerated treatment well    Behavior During Therapy WFL for tasks assessed/performed             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[place])    Diabetes mellitus without complication ([place])    Excessive hair growth 07/19/2015   Heart murmur    History of kidney stones    Hyperlipidemia    Hypertension    Neuromuscular disorder ([place])    diabetic neuropathy   Neuropathy    Postmenopausal 07/19/2015    Past Surgical History:  Procedure Laterality Date   ANTERIOR AND POSTERIOR REPAIR N/A 04/28/2018   Procedure: ANTERIOR (CYSTOCELE) AND POSTERIOR REPAIR (RECTOCELE);  Surgeon: Jonnie Kind, MD;  Location: [facility];  Service: Gynecology;  Laterality: N/A;   APPENDECTOMY     CARPAL TUNNEL RELEASE Bilateral 2001   CESAREAN SECTION     COLONOSCOPY N/A 08/28/2012   Procedure: COLONOSCOPY;  Surgeon: Rogene Houston, MD;  [facility];  Service: Endoscopy;  Laterality: N/A;  915   COLONOSCOPY N/A 12/25/2017   Procedure: COLONOSCOPY;  Surgeon: Rogene Houston, MD;   [facility];  Service: Endoscopy;  Laterality: N/A;  12:45   REVERSE SHOULDER ARTHROPLASTY Left 07/10/2020   Procedure: REVERSE SHOULDER ARTHROPLASTY;  Surgeon: [name], MD;  Location: [facility];  Service: Orthopedics;  Laterality: Left;    There were no vitals filed for this visit.   Subjective Assessment - 09/19/20 0947     Subjective  S: My right arm was hurting a little bit but my left is ok.    Currently in Pain? No/denies                [facility] OT Assessment - 09/19/20 0947       Assessment   Medical Diagnosis s/p left reverse TSA      Precautions   Precautions Shoulder    Type of Shoulder Precautions See protocol      Observation/Other Assessments   Focus on Therapeutic Outcomes (FOTO)  61/100   37/100 previous     Palpation   Palpation comment min fascial restrictions in left shoulder region      AROM   Overall AROM Comments assessed in supine, external and internal rotation with shoulder adducted    AROM Assessment Site Shoulder    Right/Left Shoulder Left    Left Shoulder Flexion 125 Degrees   100 previous  Left Shoulder ABduction 100 Degrees   70 previous   Left Shoulder Internal Rotation 90 Degrees   same as previous   Left Shoulder External Rotation 35 Degrees   30 previous     PROM   Overall PROM Comments Assessed supine, er/IR adducted    PROM Assessment Site Shoulder    Right/Left Shoulder Left    Left Shoulder Flexion 140 Degrees   115 previous   Left Shoulder ABduction 150 Degrees   135 previous   Left Shoulder Internal Rotation 90 Degrees   same as previous   Left Shoulder External Rotation 56 Degrees   40 previous     Strength   Overall Strength Comments assessed seated, er/IR adducted    Strength Assessment Site Shoulder    Right/Left Shoulder Left    Left Shoulder Flexion 3-/5   not previously assessed   Left Shoulder ABduction 3-/5   not previously assessed   Left Shoulder Internal Rotation 4/5   not previously assessed    Left Shoulder External Rotation 2+/5   not previously assessed                     OT Treatments/Exercises (OP) - 09/19/20 0949       Exercises   Exercises Shoulder      Shoulder Exercises: Supine   Protraction PROM;5 reps    Horizontal ABduction PROM;5 reps    External Rotation PROM;5 reps    Internal Rotation PROM;5 reps    Flexion PROM;5 reps    ABduction PROM;5 reps      Shoulder Exercises: ROM/Strengthening   UBE (Upper Arm Bike) Level 1 3' reverse, pace: 4.5    Over Head Lace seated, 1' lacing      Manual Therapy   Manual Therapy Myofascial release    Manual therapy comments manual therapy completed seperately from all other interventions this date of service    Myofascial Release myofascial release and manual stretching to left upper arm, scapular, shoulder region to decrease pain and restrictions and improve pain free mobility in her left shoulder per protcol.                      OT Short Term Goals - 09/19/20 1007       OT SHORT TERM GOAL #1   Title Patient will be educated and demonstrate understanding of HEP in order to increase functional use of her LUE during basic daily tasks while increasing ROM and strength.    Time 4    Period Weeks    Status On-going    Target Date 08/24/20      OT SHORT TERM GOAL #2   Title Pt will increase LUE P/ROM to [facility] to improve ability to complete dressing tasks with minimal compensatory strategies.    Time 4    Period Weeks    Status Achieved      OT SHORT TERM GOAL #3   Title Pt will increase LUE strength to 3+/5 to improve ability to reach items at waist to chest height during ADLs.    Time 4    Period Weeks    Status Partially Met               OT Long Term Goals - 09/19/20 1008       OT LONG TERM GOAL #1   Title Pt will decrease LUE pain to 3/10 or less to improve ability to use LUE as assist during ADLs and  leisure tasks.    Time 8    Period Weeks    Status Achieved      OT  LONG TERM GOAL #2   Title Pt will decrease LUE fascial restrictions to minimal amounts to improve mobility required for functional reaching tasks.    Time 8    Period Weeks    Status Achieved      OT LONG TERM GOAL #3   Title Pt will increase LUE A/ROM to [facility] to improve ability to perform functional reaching overhead and behind back during dressing and bathing tasks.    Time 8    Period Weeks    Status On-going      OT LONG TERM GOAL #4   Title Pt will increase LUE strength to 4+/5 or greater to improve ability to perform housekeeping tasks using LUE as non-dominant.    Time 8    Period Weeks    Status On-going                   Plan - 09/19/20 1035     Clinical Impression Statement A: Reassessment completed this session, pt has met 1/3 STGs and 2/4 LTGs, making progress with ROM, strength, and functional use of her LUE. Pt reports improvement in ADL completion including dressing and bathing, meal preparation. Pt has continued difficulty reaching overhead, lifting items with her LUE, and fatigues easily. Of note, pt reports RUE pain due to overuse during LUE recovery. Pt is agreeable to continued therapy services, would like to decrease to 1x/week due to number of other appointments and assisting her daughter, and rising gas prices. Continued with functional reaching tasks, A/ROM, and building activity tolerance this session. Verbal cuing for form and technique.    OT Occupational Profile and History Problem Focused Assessment - Including review of records relating to presenting problem    Occupational performance deficits (Please refer to evaluation for details): ADL's;IADL's;Leisure    Body Structure / Function / Physical Skills ADL;Endurance;UE functional use;Fascial restriction;Muscle spasms;Pain;ROM;IADL;Strength;Edema;Mobility    Rehab Potential Good    Clinical Decision Making Limited treatment options, no task modification necessary    Comorbidities Affecting Occupational  Performance: None    Modification or Assistance to Complete Evaluation  No modification of tasks or assist necessary to complete eval    OT Frequency 1x / week    OT Duration 4 weeks    OT Treatment/Interventions Self-care/ADL training;Ultrasound;DME and/or AE instruction;Patient/family education;Passive range of motion;Cryotherapy;Electrical Stimulation;Splinting;Functional Mobility Training;Moist Heat;Therapeutic exercise;Manual Therapy;Therapeutic activities    Plan P: Pt will benefit from continued OT services to increase LUE A/ROM, strength, and functional use as non-dominant. Next session: functional reaching, attempt red theraband strengthening    OT Home Exercise Plan 5/12: standing AA/ROM 6/7: A/ROM    Consulted and Agree with Plan of Care Patient             Patient will benefit from skilled therapeutic intervention in order to improve the following deficits and impairments:   Body Structure / Function / Physical Skills: ADL, Endurance, UE functional use, Fascial restriction, Muscle spasms, Pain, ROM, IADL, Strength, Edema, Mobility       Visit Diagnosis: Acute pain of left shoulder  Stiffness of left shoulder, not elsewhere classified  Other symptoms and signs involving the musculoskeletal system    Problem List Patient Active Problem List   Diagnosis Date Noted   Rotator cuff arthropathy of left shoulder 07/10/2020   Absolute anemia 10/30/2017   Constipation 06/10/2017   Hemorrhoids  06/10/2017   Cystocele with rectocele 06/10/2017   Rectocele 06/10/2017   Diabetic foot ulcer ([place]) 09/27/2016   CKD (chronic kidney disease), stage III ([place]) 09/27/2016   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[place]) 09/27/2016   Hyperlipidemia 09/27/2016   Hypertension 09/27/2016   Diabetic ulcer of foot associated with diabetes mellitus due to underlying condition, limited to breakdown of skin ([place]) 09/27/2016   Postmenopausal 07/19/2015   Essential hypertension, benign  08/11/2012   High cholesterol 08/11/2012   Family hx of colon cancer 08/11/2012   Diabetes ([place]) 08/11/2012    Guadelupe Sabin, OTR/L  [phone] 09/19/2020, 10:40 AM  [place], [place], [address] Phone: [phone]   Fax:  [phone]  Name: Stacy Webb MRN: [id number] Date of Birth: 1945/05/25

## 2020-09-19 NOTE — Assessment & Plan Note (Signed)
BP Readings from Last 3 Encounters:  09/19/20 117/66  07/11/20 (!) 148/72  07/07/20 (!) 201/95  -BP well controlled today -takes diltiazem, HCTZ, and losartan -refilled HCTZ today

## 2020-09-19 NOTE — Assessment & Plan Note (Signed)
-  obtain records from Dr. Karie Kirks

## 2020-09-19 NOTE — Assessment & Plan Note (Signed)
-  check labs with next visit

## 2020-09-19 NOTE — Assessment & Plan Note (Signed)
-  will check labs with next visit

## 2020-09-19 NOTE — Patient Instructions (Signed)
Please have fasting labs drawn 2-3 days prior to your appointment so we can discuss the results during your office visit.  

## 2020-09-21 NOTE — Progress Notes (Signed)
Pt was seen today to pick up her diabetic shoes. She was advised to give our office a call if she had any problems with her shoes.

## 2020-09-22 ENCOUNTER — Ambulatory Visit (INDEPENDENT_AMBULATORY_CARE_PROVIDER_SITE_OTHER): Payer: Medicare Other

## 2020-09-22 ENCOUNTER — Other Ambulatory Visit: Payer: Self-pay

## 2020-09-22 DIAGNOSIS — Z0000 Encounter for general adult medical examination without abnormal findings: Secondary | ICD-10-CM

## 2020-09-22 NOTE — Patient Instructions (Signed)
Stacy Webb , Thank you for taking time to come for your Medicare Wellness Visit. I appreciate your ongoing commitment to your health goals. Please review the following plan we discussed and let me know if I can assist you in the future.   Screening recommendations/referrals: Colonoscopy: Up to date  Mammogram: Due, pt verbalizes understanding and will call for orders to be placed.  Bone Density: Due, pt verbalizes understanding and will call for orders to be placed.   Recommended yearly ophthalmology/optometry visit for glaucoma screening and checkup Recommended yearly dental visit for hygiene and checkup  Vaccinations: Influenza vaccine: Up to date  Pneumococcal vaccine: Up to date, pending records for dates from Dr. Vickey Sages office.  Tdap vaccine: Up to date  Shingles vaccine: Up to date, pending records for dates from Dr. Vickey Sages office.     Advanced directives: Yes, on file.   Conditions/risks identified: Pt would like to discuss urinary frequency at night at next office visit with PCP.   Next appointment: 12/20/20 @ 2:00pm with Donneta Romberg, AGNP-C    Preventive Care 65 Years and Older, Female Preventive care refers to lifestyle choices and visits with your health care provider that can promote health and wellness. What does preventive care include? A yearly physical exam. This is also called an annual well check. Dental exams once or twice a year. Routine eye exams. Ask your health care provider how often you should have your eyes checked. Personal lifestyle choices, including: Daily care of your teeth and gums. Regular physical activity. Eating a healthy diet. Avoiding tobacco and drug use. Limiting alcohol use. Practicing safe sex. Taking low-dose aspirin every day. Taking vitamin and mineral supplements as recommended by your health care provider. What happens during an annual well check? The services and screenings done by your health care provider during your  annual well check will depend on your age, overall health, lifestyle risk factors, and family history of disease. Counseling  Your health care provider may ask you questions about your: Alcohol use. Tobacco use. Drug use. Emotional well-being. Home and relationship well-being. Sexual activity. Eating habits. History of falls. Memory and ability to understand (cognition). Work and work Statistician. Reproductive health. Screening  You may have the following tests or measurements: Height, weight, and BMI. Blood pressure. Lipid and cholesterol levels. These may be checked every 5 years, or more frequently if you are over 27 years old. Skin check. Lung cancer screening. You may have this screening every year starting at age 58 if you have a 30-pack-year history of smoking and currently smoke or have quit within the past 15 years. Fecal occult blood test (FOBT) of the stool. You may have this test every year starting at age 66. Flexible sigmoidoscopy or colonoscopy. You may have a sigmoidoscopy every 5 years or a colonoscopy every 10 years starting at age 87. Hepatitis C blood test. Hepatitis B blood test. Sexually transmitted disease (STD) testing. Diabetes screening. This is done by checking your blood sugar (glucose) after you have not eaten for a while (fasting). You may have this done every 1-3 years. Bone density scan. This is done to screen for osteoporosis. You may have this done starting at age 4. Mammogram. This may be done every 1-2 years. Talk to your health care provider about how often you should have regular mammograms. Talk with your health care provider about your test results, treatment options, and if necessary, the need for more tests. Vaccines  Your health care provider may recommend certain vaccines,  such as: Influenza vaccine. This is recommended every year. Tetanus, diphtheria, and acellular pertussis (Tdap, Td) vaccine. You may need a Td booster every 10  years. Zoster vaccine. You may need this after age 48. Pneumococcal 13-valent conjugate (PCV13) vaccine. One dose is recommended after age 48. Pneumococcal polysaccharide (PPSV23) vaccine. One dose is recommended after age 40. Talk to your health care provider about which screenings and vaccines you need and how often you need them. This information is not intended to replace advice given to you by your health care provider. Make sure you discuss any questions you have with your health care provider. Document Released: 04/21/2015 Document Revised: 12/13/2015 Document Reviewed: 01/24/2015 Elsevier Interactive Patient Education  2017 [place] Prevention in the Home Falls can cause injuries. They can happen to people of all ages. There are many things you can do to make your home safe and to help prevent falls. What can I do on the outside of my home? Regularly fix the edges of walkways and driveways and fix any cracks. Remove anything that might make you trip as you walk through a door, such as a raised step or threshold. Trim any bushes or trees on the path to your home. Use bright outdoor lighting. Clear any walking paths of anything that might make someone trip, such as rocks or tools. Regularly check to see if handrails are loose or broken. Make sure that both sides of any steps have handrails. Any raised decks and porches should have guardrails on the edges. Have any leaves, snow, or ice cleared regularly. Use sand or salt on walking paths during winter. Clean up any spills in your garage right away. This includes oil or grease spills. What can I do in the bathroom? Use night lights. Install grab bars by the toilet and in the tub and shower. Do not use towel bars as grab bars. Use non-skid mats or decals in the tub or shower. If you need to sit down in the shower, use a plastic, non-slip stool. Keep the floor dry. Clean up any water that spills on the floor as soon as it  happens. Remove soap buildup in the tub or shower regularly. Attach bath mats securely with double-sided non-slip rug tape. Do not have throw rugs and other things on the floor that can make you trip. What can I do in the bedroom? Use night lights. Make sure that you have a light by your bed that is easy to reach. Do not use any sheets or blankets that are too big for your bed. They should not hang down onto the floor. Have a firm chair that has side arms. You can use this for support while you get dressed. Do not have throw rugs and other things on the floor that can make you trip. What can I do in the kitchen? Clean up any spills right away. Avoid walking on wet floors. Keep items that you use a lot in easy-to-reach places. If you need to reach something above you, use a strong step stool that has a grab bar. Keep electrical cords out of the way. Do not use floor polish or wax that makes floors slippery. If you must use wax, use non-skid floor wax. Do not have throw rugs and other things on the floor that can make you trip. What can I do with my stairs? Do not leave any items on the stairs. Make sure that there are handrails on both sides of the stairs and  use them. Fix handrails that are broken or loose. Make sure that handrails are as long as the stairways. Check any carpeting to make sure that it is firmly attached to the stairs. Fix any carpet that is loose or worn. Avoid having throw rugs at the top or bottom of the stairs. If you do have throw rugs, attach them to the floor with carpet tape. Make sure that you have a light switch at the top of the stairs and the bottom of the stairs. If you do not have them, ask someone to add them for you. What else can I do to help prevent falls? Wear shoes that: Do not have high heels. Have rubber bottoms. Are comfortable and fit you well. Are closed at the toe. Do not wear sandals. If you use a stepladder: Make sure that it is fully opened.  Do not climb a closed stepladder. Make sure that both sides of the stepladder are locked into place. Ask someone to hold it for you, if possible. Clearly mark and make sure that you can see: Any grab bars or handrails. First and last steps. Where the edge of each step is. Use tools that help you move around (mobility aids) if they are needed. These include: Canes. Walkers. Scooters. Crutches. Turn on the lights when you go into a dark area. Replace any light bulbs as soon as they burn out. Set up your furniture so you have a clear path. Avoid moving your furniture around. If any of your floors are uneven, fix them. If there are any pets around you, be aware of where they are. Review your medicines with your doctor. Some medicines can make you feel dizzy. This can increase your chance of falling. Ask your doctor what other things that you can do to help prevent falls. This information is not intended to replace advice given to you by your health care provider. Make sure you discuss any questions you have with your health care provider. Document Released: 01/19/2009 Document Revised: 08/31/2015 Document Reviewed: 04/29/2014 Elsevier Interactive Patient Education  2017 [employer].

## 2020-09-22 NOTE — Progress Notes (Signed)
Subjective:   Stacy Webb is a 75 y.o. female who presents for Medicare Annual (Subsequent) preventive examination.        Objective:    There were no vitals filed for this visit. There is no height or weight on file to calculate BMI.  Advanced Directives 07/25/2020 07/10/2020 07/07/2020 04/03/2020 12/24/2018 04/28/2018 04/28/2018  Does Patient Have a Medical Advance Directive? Yes Yes Yes No No No No  Type of Advance Directive Living will Living will Stacy Webb  Does patient want to make changes to medical advance directive? No - Patient declined No - Patient declined No - Patient declined - - - -  Copy of Press photographer in [place]  Would patient like information on creating a medical advance directive? - - - - No - Patient declined No - Patient declined No - Patient declined  Pre-existing out of facility DNR order (yellow form or pink MOST form) - - - - - - -    Current Medications (verified) Outpatient Encounter Medications as of 09/22/2020  Medication Sig   ACCU-CHEK AVIVA PLUS test strip    atorvastatin (LIPITOR) 20 MG tablet Take 20 mg by mouth daily.   diltiazem (CARDIZEM CD) 360 MG 24 hr capsule Take 360 mg by mouth daily.    docusate sodium (COLACE) 100 MG capsule Take 200 mg by mouth at bedtime.   gabapentin (NEURONTIN) 100 MG capsule Take 100 mg by mouth 2 (two) times daily.    hydrochlorothiazide (HYDRODIURIL) 25 MG tablet Take 1 tablet (25 mg total) by mouth daily.   losartan (COZAAR) 50 MG tablet Take 50 mg by mouth daily.   metFORMIN (GLUCOPHAGE) 1000 MG tablet Take 1,000 mg by mouth 2 (two) times daily with a meal.   vitamin B-12 (CYANOCOBALAMIN) 500 MCG tablet Take 500 mcg by mouth 2 (two) times daily.    [DISCONTINUED] conjugated estrogens (PREMARIN) vaginal cream Place 9.50 Applicatorfuls vaginally 3 (three) times a week. For vaginal thinning and irritation in preop timeframe (Patient not taking: Reported on  06/21/2019)   [DISCONTINUED] ferrous sulfate 325 (65 FE) MG tablet Take 325 mg by mouth daily with breakfast.  (Patient not taking: Reported on 11/02/2019)   No facility-administered encounter medications on file as of 09/22/2020.    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[place])    Diabetes mellitus without complication (HCC)    Excessive hair growth 07/19/2015   Heart murmur    History of kidney stones    Hyperlipidemia    Hypertension    Neuromuscular disorder ([place])    diabetic neuropathy   Neuropathy    Postmenopausal 07/19/2015   Past Surgical History:  Procedure Laterality Date   ANTERIOR AND POSTERIOR REPAIR N/A 04/28/2018   Procedure: ANTERIOR (CYSTOCELE) AND POSTERIOR REPAIR (RECTOCELE);  Surgeon: Jonnie Kind, MD;  Location: [facility];  Service: Gynecology;  Laterality: N/A;   APPENDECTOMY     CARPAL TUNNEL RELEASE Bilateral 2001   CESAREAN SECTION     COLONOSCOPY N/A 08/28/2012   Procedure: COLONOSCOPY;  Surgeon: Rogene Houston, MD;  Location: [facility];  Service: Endoscopy;  Laterality: N/A;  915   COLONOSCOPY N/A 12/25/2017   Procedure: COLONOSCOPY;  Surgeon: Rogene Houston, MD;  Location:  [facility];  Service: Endoscopy;  Laterality: N/A;  12:45   REVERSE SHOULDER ARTHROPLASTY Left 07/10/2020   Procedure: REVERSE SHOULDER ARTHROPLASTY;  Surgeon: [name], MD;  Location: [facility];  Service: Orthopedics;  Laterality: Left;   Family History  Problem Relation Age of Onset   Pneumonia Son    Suicidality Son    Diabetes Daughter    Colon cancer Paternal Aunt    Social History   Socioeconomic History   Marital status: Widowed    Spouse name: Not on file   Number of children: 3   Years of education: Not on file   Highest education level: Not on file  Occupational History   Occupation: [employer]    Comment:  Medical Records  Tobacco Use   Smoking status: Never   Smokeless tobacco: Never  Vaping Use   Vaping Use: Never used  Substance and Sexual Activity   Alcohol use: No   Drug use: No   Sexual activity: Not Currently    Birth control/protection: Post-menopausal  Other Topics Concern   Not on file  Social History Narrative   Husband had ALS and passed away.Twin boys, but one died at 58 months and her other son died in 07/01/14.      Living daughter- lives with pt.   Social Determinants of Health   Financial Resource Strain: Not on file  Food Insecurity: Not on file  Transportation Needs: Not on file  Physical Activity: Not on file  Stress: Not on file  Social Connections: Not on file    Tobacco Counseling Counseling given: Not Answered                  Diabetic? Yes          Activities of Daily Living In your present state of health, do you have any difficulty performing the following activities: 07/10/2020 07/07/2020  Hearing? N N  Vision? N N  Difficulty concentrating or making decisions? N N  Walking or climbing stairs? N N  Dressing or bathing? N N  Doing errands, shopping? N N  Some recent data might be hidden    Patient Care Team: Noreene Larsson, NP as PCP - General (Nurse Practitioner)  Indicate any recent Medical Services you may have received from other than [facility] providers in the past year (date may be approximate).     Assessment:   This is a routine wellness examination for Stacy Webb.  Hearing/Vision screen No results found.  Dietary issues and exercise activities discussed:     Goals Addressed   None   Depression Screen PHQ 2/9 Scores 09/19/2020  PHQ - 2 Score 0    Fall Risk Fall Risk  09/19/2020 04/13/2018  Falls in the past year? 1 1  Number falls in past yr: 1 0  Injury with Fall? 1 1  Risk for fall due to : Impaired balance/gait -  Follow up Falls evaluation completed -    FALL RISK PREVENTION PERTAINING TO THE HOME:  Any  stairs in or around the home? No  If so, are there any without handrails? No  Home free of loose throw rugs in walkways, pet beds, electrical cords, etc? Yes  Adequate lighting in your home to reduce risk of falls? Yes   ASSISTIVE DEVICES UTILIZED TO PREVENT FALLS:  Life alert? Yes  Use of a cane, Andis or w/c? No  Grab bars in the bathroom? Yes  Shower chair or bench in shower? Yes  Elevated toilet  seat or a handicapped toilet? Yes   TIMED UP AND GO:  Was the test performed? No .     Cognitive Function:        Immunizations Immunization History  Administered Date(s) Administered   Moderna SARS-COV2 Booster Vaccination 02/24/2020   Tdap 11/23/2018    TDAP status: Up to date  Flu Vaccine status: Up to date  Pneumococcal vaccine status: Up to date  Covid-19 vaccine status: Completed vaccines  Qualifies for Shingles Vaccine? Yes   Zostavax completed Yes   Shingrix Completed?: Yes  Screening Tests Health Maintenance  Topic Date Due   FOOT EXAM  Never done   OPHTHALMOLOGY EXAM  Never done   Hepatitis C Screening  Never done   Zoster Vaccines- Shingrix (1 of 2) Never done   PNA vac Low Risk Adult (1 of 2 - PCV13) Never done   COVID-19 Vaccine (2 - Moderna risk series) 03/23/2020   INFLUENZA VACCINE  11/06/2020   HEMOGLOBIN A1C  01/06/2021   MAMMOGRAM  04/06/2021   COLONOSCOPY (Pts 45-74yrs Insurance coverage will need to be confirmed)  12/26/2027   TETANUS/TDAP  11/22/2028   DEXA SCAN  Completed   HPV VACCINES  Aged Out    Health Maintenance  Health Maintenance Due  Topic Date Due   FOOT EXAM  Never done   OPHTHALMOLOGY EXAM  Never done   Hepatitis C Screening  Never done   Zoster Vaccines- Shingrix (1 of 2) Never done   PNA vac Low Risk Adult (1 of 2 - PCV13) Never done   COVID-19 Vaccine (2 - Moderna risk series) 03/23/2020    Colorectal cancer screening: Type of screening: Colonoscopy. Completed  . Repeat every 5 years  Mammogram: DUE, pt will  schedule.   Bone Density Scan: DUE, pt will schedule.   Lung Cancer Screening: (Low Dose CT Chest recommended if Age 66-80 years, 30 pack-year currently smoking OR have quit w/in 15years.) does not qualify.     Additional Screening:  Hepatitis C Screening: does qualify; never done   Vision Screening: Recommended annual ophthalmology exams for early detection of glaucoma and other disorders of the eye. Is the patient up to date with their annual eye exam?  Yes  Who is the provider or what is the name of the office in which the patient attends annual eye exams? Dr. Jorja Loa, currently [facility].  If pt is not established with a provider, would they like to be referred to a provider to establish care? No .   Dental Screening: Recommended annual dental exams for proper oral hygiene  Community Resource Referral / Chronic Care Management: CRR required this visit?  No   CCM required this visit?  No      Plan:     I have personally reviewed and noted the following in the patient's chart:   Medical and social history Use of alcohol, tobacco or illicit drugs  Current medications and supplements including opioid prescriptions.  Functional ability and status Nutritional status Physical activity Advanced directives List of other physicians Hospitalizations, surgeries, and ER visits in previous 12 months Vitals Screenings to include cognitive, depression, and falls Referrals and appointments  In addition, I have reviewed and discussed with patient certain preventive protocols, quality metrics, and best practice recommendations. A written personalized care plan for preventive services as well as general preventive health recommendations were provided to patient.     Lonn Georgia, LPN   [id number]   Nurse Notes: Annual Wellness Visit conducted over  the phone with pt consent to televisit via audio. Pt was in the home at the time of phone call and provider located on site. This call took  approx 25 min. Discussed with pt health maintenance recommendations: Mammogram, and bone density scan pt verbalizes understanding however does not want to schedule at this time.

## 2020-09-27 ENCOUNTER — Encounter: Payer: Self-pay | Admitting: Orthopedic Surgery

## 2020-09-27 ENCOUNTER — Ambulatory Visit (INDEPENDENT_AMBULATORY_CARE_PROVIDER_SITE_OTHER): Payer: Medicare Other | Admitting: Orthopedic Surgery

## 2020-09-27 ENCOUNTER — Other Ambulatory Visit: Payer: Self-pay

## 2020-09-27 ENCOUNTER — Ambulatory Visit: Payer: Medicare Other

## 2020-09-27 DIAGNOSIS — G8929 Other chronic pain: Secondary | ICD-10-CM

## 2020-09-27 DIAGNOSIS — M12812 Other specific arthropathies, not elsewhere classified, left shoulder: Secondary | ICD-10-CM | POA: Diagnosis not present

## 2020-09-27 DIAGNOSIS — Z96612 Presence of left artificial shoulder joint: Secondary | ICD-10-CM

## 2020-09-27 DIAGNOSIS — M25512 Pain in left shoulder: Secondary | ICD-10-CM

## 2020-09-27 NOTE — Progress Notes (Signed)
Orthopaedic Postop Note  Assessment: Stacy Webb is a 75 y.o. female s/p Left Reverse Shoulder Arthroplasty  DOS: 07/10/20  Plan: She is doing well. Pain has improved.   ROM will improve as she continues to strengthen her left shoulder Reminded her of full recovery being 6 months, with progressive improvement for up to a year Provided reassurance that she will continue to get better.  Follow-up in 3 months  Follow-up: Return in about 3 months (around 12/28/2020).  XR at next visit: Left shoulder  Subjective:  Chief Complaint  Patient presents with   Routine Post Op    LT shoulder/pt states she is feeling well    History of Present Illness: Stacy Webb is a 75 y.o. female who presents following the above stated procedure.  Surgery was just less than 3 months ago.  Minimal pain.  Occasional tylenol, mostly preceding therapy at this point.  She is able to get her arm above her head, but notes shaking when she is lifting something.  She has been working with therapy twice weekly.  She will now transition to once weekly.  She is also doing exercises on her own.  No issues with her surgical incision.    Review of Systems: No fevers or chills No numbness or tingling No Chest Pain No shortness of breath    Objective: There were no vitals taken for this visit.  Physical Exam:  Alert and oriented.  No acute distress  Left shoulder surgical incision is healing well.  No surrounding erythema or drainage.   Active forward flexion to 110 degrees.  Active abduction at her side to 90 degrees.  Internal rotation to her lumbar spine.  10 degrees of external rotation at her side.  Sensation is intact in the axillary patch.  Intact sensation to her left hand. IMAGING: I personally ordered and reviewed the following images:  X-rays left shoulder obtained in clinic today and demonstrates excellent alignment of the reverse shoulder arthroplasty.  No acute injuries are noted.  No  evidence of hardware failure or subsidence.  Impression: Left reverse shoulder arthroplasty in excellent position  [name], MD 09/27/2020 11:32 AM

## 2020-09-29 ENCOUNTER — Ambulatory Visit (HOSPITAL_COMMUNITY): Payer: Medicare Other | Admitting: Specialist

## 2020-09-29 ENCOUNTER — Encounter (HOSPITAL_COMMUNITY): Payer: Self-pay | Admitting: Specialist

## 2020-09-29 ENCOUNTER — Other Ambulatory Visit: Payer: Self-pay

## 2020-09-29 DIAGNOSIS — M25612 Stiffness of left shoulder, not elsewhere classified: Secondary | ICD-10-CM

## 2020-09-29 DIAGNOSIS — M25512 Pain in left shoulder: Secondary | ICD-10-CM

## 2020-09-29 DIAGNOSIS — R29898 Other symptoms and signs involving the musculoskeletal system: Secondary | ICD-10-CM

## 2020-09-29 NOTE — Therapy (Signed)
[place], [place], [address] Phone: [phone]   Fax:  [phone]  Occupational Therapy Treatment  Patient Details  Name: Stacy Webb MRN: [id number] Date of Birth: May 22, 1945 Referring Provider (OT): Dr. 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[place])    Diabetes mellitus without complication ([place])    Excessive hair growth 07/19/2015   Heart murmur    History of kidney stones    Hyperlipidemia    Hypertension    Neuromuscular disorder ([place])    diabetic neuropathy   Neuropathy    Postmenopausal 07/19/2015    Past Surgical History:  Procedure Laterality Date   ANTERIOR AND POSTERIOR REPAIR N/A 04/28/2018   Procedure: ANTERIOR (CYSTOCELE) AND POSTERIOR REPAIR (RECTOCELE);  Surgeon: Jonnie Kind, MD;  Location: [facility];  Service: Gynecology;  Laterality: N/A;   APPENDECTOMY     CARPAL TUNNEL RELEASE Bilateral 2001   CESAREAN SECTION     COLONOSCOPY N/A 08/28/2012   Procedure: COLONOSCOPY;  Surgeon: Rogene Houston, MD;  Location: [facility];  Service: Endoscopy;  Laterality: N/A;  915   COLONOSCOPY N/A 12/25/2017   Procedure: COLONOSCOPY;  Surgeon: Rogene Houston, MD;  Location: [facility];  Service: Endoscopy;  Laterality: N/A;  12:45   REVERSE SHOULDER ARTHROPLASTY Left 07/10/2020   Procedure: REVERSE SHOULDER ARTHROPLASTY;  Surgeon: [name], MD;  Location: [facility];  Service:  Orthopedics;  Laterality: Left;    There were no vitals filed for this visit.   Subjective Assessment - 09/29/20 1409     Subjective  S:  I went to the MD he said my arm shakes when I pick things up because it needs to get stronger.    Currently in Pain? No/denies                [facility] OT Assessment - 09/29/20 0001       Assessment   Medical Diagnosis s/p left reverse TSA    Referring Provider (OT) Dr. Larena Glassman      Precautions   Precautions Shoulder    Type of Shoulder Precautions See protocol                      OT Treatments/Exercises (OP) - 09/29/20 0001       Exercises   Exercises Shoulder      Shoulder Exercises: Supine   Protraction PROM;5 reps;Strengthening;10 reps    Protraction Weight (lbs) 1    Horizontal ABduction PROM;5 reps;Strengthening;10 reps    Horizontal ABduction Weight (lbs) 1    External Rotation PROM;5 reps;Strengthening;10 reps    External Rotation Weight (lbs) 1    Internal Rotation PROM;5 reps;Strengthening;10 reps    Internal Rotation Weight (lbs)  1    Flexion PROM;5 reps;Strengthening;10 reps    Shoulder Flexion Weight (lbs) 1    ABduction PROM;5 reps;Strengthening;10 reps    Shoulder ABduction Weight (lbs) 1      Shoulder Exercises: Standing   Extension Theraband;15 reps    Theraband Level (Shoulder Extension) Level 2 (Red)    Row Theraband;15 reps    Theraband Level (Shoulder Row) Level 2 (Red)    Other Standing Exercises standing holding basketball between palms, completed chest press, flexion, overhead press 10 times each, requirng rest breaks at repetiiton 5, unable to flex or overhead press above 90 degrees      Shoulder Exercises: ROM/Strengthening   Proximal Shoulder Strengthening, Supine 10 times each with 1# resistance    Other ROM/Strengthening Exercises seated completed graduated pinch tree placing 25 pins on horizontal bars and then able to place 14 on vertical pole starting from base      Manual  Therapy   Manual Therapy Myofascial release    Manual therapy comments manual therapy completed seperately from all other interventions this date of service    Myofascial Release myofascial release and manual stretching to left upper arm, scapular, shoulder region to decrease pain and restrictions and improve pain free mobility in her left shoulder per protcol.                      OT Short Term Goals - 09/19/20 1007       OT SHORT TERM GOAL #1   Title Patient will be educated and demonstrate understanding of HEP in order to increase functional use of her LUE during basic daily tasks while increasing ROM and strength.    Time 4    Period Weeks    Status On-going    Target Date 08/24/20      OT SHORT TERM GOAL #2   Title Pt will increase LUE P/ROM to [facility] to improve ability to complete dressing tasks with minimal compensatory strategies.    Time 4    Period Weeks    Status Achieved      OT SHORT TERM GOAL #3   Title Pt will increase LUE strength to 3+/5 to improve ability to reach items at waist to chest height during ADLs.    Time 4    Period Weeks    Status Partially Met               OT Long Term Goals - 09/19/20 1008       OT LONG TERM GOAL #1   Title Pt will decrease LUE pain to 3/10 or less to improve ability to use LUE as assist during ADLs and leisure tasks.    Time 8    Period Weeks    Status Achieved      OT LONG TERM GOAL #2   Title Pt will decrease LUE fascial restrictions to minimal amounts to improve mobility required for functional reaching tasks.    Time 8    Period Weeks    Status Achieved      OT LONG TERM GOAL #3   Title Pt will increase LUE A/ROM to [facility] to improve ability to perform functional reaching overhead and behind back during dressing and bathing tasks.    Time 8    Period Weeks    Status On-going      OT LONG TERM GOAL #4   Title Pt will increase LUE strength to 4+/5 or greater to improve ability to perform housekeeping  tasks using LUE as non-dominant.    Time 8    Period Weeks    Status On-going                   Plan - 09/29/20 1409     Clinical Impression Statement A:  transitioned to strengthening in supine and added ball exercises for increased ue strength in standing.  patient becomes fatigued quickly with standing strengthening exercises and lue does not flex above 90 with flexion or overhead press while holding a basketball between both hands.    Body Structure / Function / Physical Skills ADL;Endurance;UE functional use;Fascial restriction;Muscle spasms;Pain;ROM;IADL;Strength;Edema;Mobility    Plan P:  Continue to focus on sustained activity tolerance and strength needed to lift functional items overhead without fatiguing, improve to 15 or more clothespins placed on vertical bar from seated position.             Patient will benefit from skilled therapeutic intervention in order to improve the following deficits and impairments:   Body Structure / Function / Physical Skills: ADL, Endurance, UE functional use, Fascial restriction, Muscle spasms, Pain, ROM, IADL, Strength, Edema, Mobility       Visit Diagnosis: Acute pain of left shoulder  Stiffness of left shoulder, not elsewhere classified  Other symptoms and signs involving the musculoskeletal system    Problem List Patient Active Problem List   Diagnosis Date Noted   Encounter to establish care 09/19/2020   Rotator cuff arthropathy of left shoulder 07/10/2020   Absolute anemia 10/30/2017   Constipation 06/10/2017   Hemorrhoids 06/10/2017   Cystocele with rectocele 06/10/2017   Rectocele 06/10/2017   Diabetic foot ulcer ([place]) 09/27/2016   CKD (chronic kidney disease), stage III ([place]) 09/27/2016   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

## 2020-10-02 ENCOUNTER — Other Ambulatory Visit (HOSPITAL_COMMUNITY): Payer: Self-pay | Admitting: Nurse Practitioner

## 2020-10-02 ENCOUNTER — Telehealth: Payer: Self-pay

## 2020-10-02 ENCOUNTER — Other Ambulatory Visit: Payer: Self-pay

## 2020-10-02 DIAGNOSIS — Z13820 Encounter for screening for osteoporosis: Secondary | ICD-10-CM

## 2020-10-02 DIAGNOSIS — Z780 Asymptomatic menopausal state: Secondary | ICD-10-CM

## 2020-10-02 DIAGNOSIS — Z1231 Encounter for screening mammogram for malignant neoplasm of breast: Secondary | ICD-10-CM

## 2020-10-02 NOTE — Telephone Encounter (Signed)
Ordered and scheduled. Pt informed.

## 2020-10-02 NOTE — Telephone Encounter (Signed)
Patient called asked if she could be setup for a bone density on the same day as her Mammogram 10/13/2020 she was just seen in our office June 17th. Patient call back # [phone] you can leave a detail message if she does not answer the phone.

## 2020-10-05 ENCOUNTER — Other Ambulatory Visit: Payer: Self-pay

## 2020-10-05 ENCOUNTER — Encounter (HOSPITAL_COMMUNITY): Payer: Self-pay | Admitting: Occupational Therapy

## 2020-10-05 ENCOUNTER — Ambulatory Visit (HOSPITAL_COMMUNITY): Payer: Medicare Other | Admitting: Occupational Therapy

## 2020-10-05 DIAGNOSIS — M25512 Pain in left shoulder: Secondary | ICD-10-CM

## 2020-10-05 DIAGNOSIS — M25612 Stiffness of left shoulder, not elsewhere classified: Secondary | ICD-10-CM

## 2020-10-05 DIAGNOSIS — R29898 Other symptoms and signs involving the musculoskeletal system: Secondary | ICD-10-CM

## 2020-10-05 NOTE — Therapy (Signed)
[place], [place], [address] Phone: [phone]   Fax:  [phone]  Occupational Therapy Treatment  Patient Details  Name: Stacy Webb MRN: [id number] Date of Birth: 1945/11/16 Referring Provider (OT): Dr. Larena Glassman   Encounter Date: 10/05/2020   OT End of Session - 10/05/20 1025     Visit Number 19    Number of Visits 21    Date for OT Re-Evaluation 10/19/20    Authorization Type UHC Medicare, $30 copay    Authorization Time Period no visit limit    Progress Note Due on Visit 30    OT Start Time 0947    OT Stop Time 1026    OT Time Calculation (min) 39 min    Activity Tolerance Patient tolerated treatment well    Behavior During Therapy [facility] for tasks assessed/performed             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[place])    Diabetes mellitus without complication ([place])    Excessive hair growth 07/19/2015   Heart murmur    History of kidney stones    Hyperlipidemia    Hypertension    Neuromuscular disorder ([place])    diabetic neuropathy   Neuropathy    Postmenopausal 07/19/2015    Past Surgical History:  Procedure Laterality Date   ANTERIOR AND POSTERIOR REPAIR N/A 04/28/2018   Procedure: ANTERIOR (CYSTOCELE) AND POSTERIOR REPAIR (RECTOCELE);  Surgeon: Jonnie Kind, MD;  Location: [facility];  Service: Gynecology;  Laterality: N/A;   APPENDECTOMY     CARPAL TUNNEL RELEASE Bilateral 2001   CESAREAN SECTION     COLONOSCOPY N/A 08/28/2012   Procedure: COLONOSCOPY;  Surgeon: Rogene Houston, MD;  [facility];  Service: Endoscopy;  Laterality: N/A;  915   COLONOSCOPY N/A 12/25/2017   Procedure: COLONOSCOPY;  Surgeon: Rogene Houston, MD;  [facility];  Service: Endoscopy;  Laterality: N/A;  12:45   REVERSE SHOULDER ARTHROPLASTY Left 07/10/2020   Procedure: REVERSE SHOULDER  ARTHROPLASTY;  Surgeon: [name], MD;  Location: [facility];  Service: Orthopedics;  Laterality: Left;    There were no vitals filed for this visit.   Subjective Assessment - 10/05/20 0948     Subjective  S: I was sore not the next day but the second day after I came.    Currently in Pain? No/denies                [facility] OT Assessment - 10/05/20 0948       Assessment   Medical Diagnosis s/p left reverse TSA      Precautions   Precautions Shoulder    Type of Shoulder Precautions See protocol                      OT Treatments/Exercises (OP) - 10/05/20 0948       Exercises   Exercises Shoulder      Shoulder Exercises: Supine   Protraction PROM;5 reps;Strengthening;10 reps    Protraction Weight (lbs) 1    Horizontal ABduction PROM;5 reps;Strengthening;10 reps    Horizontal ABduction Weight (lbs) 1    External Rotation PROM;5 reps;Strengthening;10 reps    External Rotation Weight (lbs) 1    Internal Rotation PROM;5 reps;Strengthening;10 reps    Internal Rotation Weight (lbs) 1  Flexion PROM;5 reps;Strengthening;10 reps    Shoulder Flexion Weight (lbs) 1    ABduction PROM;5 reps;Strengthening;10 reps    Shoulder ABduction Weight (lbs) 1      Shoulder Exercises: Standing   Protraction Strengthening;10 reps    Protraction Weight (lbs) 1    External Rotation Strengthening;10 reps    External Rotation Weight (lbs) 1    Internal Rotation Strengthening;10 reps    Internal Rotation Weight (lbs) 1    Flexion Strengthening;10 reps;Limitations    Shoulder Flexion Weight (lbs) 1    Flexion Limitations to 50% range    ABduction Strengthening;10 reps;Limitations    Shoulder ABduction Weight (lbs) 1    ABduction Limitations to 50% range      Shoulder Exercises: ROM/Strengthening   UBE (Upper Arm Bike) Level 2, 3' forward, 3' reverse, pace: 5.5    Proximal Shoulder Strengthening, Supine 10X each, 1#, no rest breaks    Other ROM/Strengthening Exercises Pt  seated, placed 16 pins on vertical bar of pinch tree.      Functional Reaching Activities   Mid Level Pt removing items from middle shelf of ADL cabinet, then moving items on lower shelf to middle shelf, and replacing remaining items onto lower shelf.      Manual Therapy   Manual Therapy Myofascial release    Manual therapy comments manual therapy completed seperately from all other interventions this date of service    Myofascial Release myofascial release and manual stretching to left upper arm, scapular, shoulder region to decrease pain and restrictions and improve pain free mobility in her left shoulder per protcol.                      OT Short Term Goals - 09/19/20 1007       OT SHORT TERM GOAL #1   Title Patient will be educated and demonstrate understanding of HEP in order to increase functional use of her LUE during basic daily tasks while increasing ROM and strength.    Time 4    Period Weeks    Status On-going    Target Date 08/24/20      OT SHORT TERM GOAL #2   Title Pt will increase LUE P/ROM to [facility] to improve ability to complete dressing tasks with minimal compensatory strategies.    Time 4    Period Weeks    Status Achieved      OT SHORT TERM GOAL #3   Title Pt will increase LUE strength to 3+/5 to improve ability to reach items at waist to chest height during ADLs.    Time 4    Period Weeks    Status Partially Met               OT Long Term Goals - 09/19/20 1008       OT LONG TERM GOAL #1   Title Pt will decrease LUE pain to 3/10 or less to improve ability to use LUE as assist during ADLs and leisure tasks.    Time 8    Period Weeks    Status Achieved      OT LONG TERM GOAL #2   Title Pt will decrease LUE fascial restrictions to minimal amounts to improve mobility required for functional reaching tasks.    Time 8    Period Weeks    Status Achieved      OT LONG TERM GOAL #3   Title Pt will increase LUE A/ROM to [facility] to improve ability  to perform functional reaching overhead and behind back during dressing and bathing tasks.    Time 8    Period Weeks    Status On-going      OT LONG TERM GOAL #4   Title Pt will increase LUE strength to 4+/5 or greater to improve ability to perform housekeeping tasks using LUE as non-dominant.    Time 8    Period Weeks    Status On-going                   Plan - 10/05/20 1023     Clinical Impression Statement A: Continued with myofascial release to address fascial restrictions in LUE. Continued with strengthening completing exercises supine and standing with 1# weights. Pt completing functional reaching tasks with extended steps, taking short rest breaks as needed. Pt was able to place 16 pins on pinch tree today, 2 more than previous session. Verbal cuing for form and technique.    Body Structure / Function / Physical Skills ADL;Endurance;UE functional use;Fascial restriction;Muscle spasms;Pain;ROM;IADL;Strength;Edema;Mobility    Plan P: Continue with strengthening attempting therapy ball strengthening, continue with functional reaching tasks, update HEP    OT Home Exercise Plan 5/12: standing AA/ROM 6/7: A/ROM    Consulted and Agree with Plan of Care Patient             Patient will benefit from skilled therapeutic intervention in order to improve the following deficits and impairments:   Body Structure / Function / Physical Skills: ADL, Endurance, UE functional use, Fascial restriction, Muscle spasms, Pain, ROM, IADL, Strength, Edema, Mobility       Visit Diagnosis: Acute pain of left shoulder  Stiffness of left shoulder, not elsewhere classified  Other symptoms and signs involving the musculoskeletal system    Problem List Patient Active Problem List   Diagnosis Date Noted   Encounter to establish care 09/19/2020   Rotator cuff arthropathy of left shoulder 07/10/2020   Absolute anemia 10/30/2017   Constipation 06/10/2017   Hemorrhoids 06/10/2017    Cystocele with rectocele 06/10/2017   Rectocele 06/10/2017   Diabetic foot ulcer ([place]) 09/27/2016   CKD (chronic kidney disease), stage III ([place]) 09/27/2016   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[place]) 09/27/2016   Hyperlipidemia 09/27/2016   Diabetic ulcer of foot associated with diabetes mellitus due to underlying condition, limited to breakdown of skin ([place]) 09/27/2016   Postmenopausal 07/19/2015   Essential hypertension, benign 08/11/2012   High cholesterol 08/11/2012   Family hx of colon cancer 08/11/2012   Diabetes ([place]) 08/11/2012    Guadelupe Sabin, OTR/L  [phone] 10/05/2020, 10:27 AM  [place], [place], [address] Phone: [phone]   Fax:  [phone]  Name: Stacy Webb MRN: [id number] Date of Birth: 12-Oct-1945

## 2020-10-10 ENCOUNTER — Ambulatory Visit (HOSPITAL_COMMUNITY): Payer: Medicare Other | Attending: Orthopedic Surgery

## 2020-10-10 ENCOUNTER — Encounter (HOSPITAL_COMMUNITY): Payer: Self-pay

## 2020-10-10 ENCOUNTER — Other Ambulatory Visit: Payer: Self-pay

## 2020-10-10 DIAGNOSIS — M25611 Stiffness of right shoulder, not elsewhere classified: Secondary | ICD-10-CM | POA: Insufficient documentation

## 2020-10-10 DIAGNOSIS — M25512 Pain in left shoulder: Secondary | ICD-10-CM | POA: Insufficient documentation

## 2020-10-10 DIAGNOSIS — M25612 Stiffness of left shoulder, not elsewhere classified: Secondary | ICD-10-CM | POA: Insufficient documentation

## 2020-10-10 DIAGNOSIS — R29898 Other symptoms and signs involving the musculoskeletal system: Secondary | ICD-10-CM | POA: Insufficient documentation

## 2020-10-10 NOTE — Patient Instructions (Signed)
Complete 10-12 repetitions. 1 time a day.  ELASTIC BAND SHOULDER EXTERNAL ROTATION - ER  While holding an elastic band at your side with your elbow bent, start with your hand near your stomach and then pull the band away. Keep your elbow at your side the entire time.      ELASTIC BAND - HORIZONTAL ABDUCTION  Start by holding an elastic band or tubing with your arm out-stretched in front of you and across your body towards the opposite side.  Next, pull the elastic band or cord horizontally and outward as shown.   Your elbow should be straight or slightly bent the entire time.     Punching with Theraband  Wrap your Theraband around something sturdy like a pole or rung on your stairs. Hold on to each end with your back to the anchor. Bring your arms/elbows to shoulder height, and punch your arms straight forward and together as pictured, slowly return to the start position.      ELASTIC BAND ROWS   Holding elastic band with both hands, draw back the band as you bend your elbows. Keep your elbows near the side of your body.       ELASTIC BAND SHOULDER EXTENSION  While holding an elastic band in front of you with your elbows straight, pull the band down and back towards your side.

## 2020-10-10 NOTE — Therapy (Signed)
[place], [place], [address] Phone: [phone]   Fax:  [phone]  Occupational Therapy Treatment  Patient Details  Name: Stacy Webb MRN: [id number] Date of Birth: 1945/12/02 Referring Provider (OT): Dr. Larena Glassman   Encounter Date: 10/10/2020   OT End of Session - 10/10/20 0959     Visit Number 20    Number of Visits 21    Date for OT Re-Evaluation 10/19/20    Authorization Type U[employer] Medicare, $30 copay    Authorization Time Period no visit limit    Progress Note Due on Visit 49    OT Start Time 0945    OT Stop Time 1025    OT Time Calculation (min) 40 min    Activity Tolerance Patient tolerated treatment well    Behavior During Therapy [facility] for tasks assessed/performed             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[place])    Diabetes mellitus without complication ([place])    Excessive hair growth 07/19/2015   Heart murmur    History of kidney stones    Hyperlipidemia    Hypertension    Neuromuscular disorder ([place])    diabetic neuropathy   Neuropathy    Postmenopausal 07/19/2015    Past Surgical History:  Procedure Laterality Date   ANTERIOR AND POSTERIOR REPAIR N/A 04/28/2018   Procedure: ANTERIOR (CYSTOCELE) AND POSTERIOR REPAIR (RECTOCELE);  Surgeon: Jonnie Kind, MD;  Location: [facility];  Service: Gynecology;  Laterality: N/A;   APPENDECTOMY     CARPAL TUNNEL RELEASE Bilateral 2001   CESAREAN SECTION     COLONOSCOPY N/A 08/28/2012   Procedure: COLONOSCOPY;  Surgeon: Rogene Houston, MD;  Location: [facility];  Service: Endoscopy;  Laterality: N/A;  915   COLONOSCOPY N/A 12/25/2017   Procedure: COLONOSCOPY;  Surgeon: Rogene Houston, MD;  Location: [facility];  Service: Endoscopy;  Laterality: N/A;  12:45   REVERSE SHOULDER ARTHROPLASTY Left 07/10/2020   Procedure: REVERSE SHOULDER ARTHROPLASTY;   Surgeon: [name], MD;  Location: [facility];  Service: Orthopedics;  Laterality: Left;    There were no vitals filed for this visit.   Subjective Assessment - 10/10/20 0949     Subjective  S: It just feels stiff. i was pushing and pulling boxes yesterday for my daughter.    Currently in Pain? No/denies                [facility] OT Assessment - 10/10/20 0957       Assessment   Medical Diagnosis s/p left reverse TSA      Precautions   Precautions Shoulder    Type of Shoulder Precautions Phase III: Progress strengthening. Light weights and resistance.                      OT Treatments/Exercises (OP) - 10/10/20 0955       Exercises   Exercises Shoulder      Shoulder Exercises: Supine   Protraction PROM;5 reps;Strengthening;12 reps    Protraction Weight (lbs) 1    Horizontal ABduction PROM;5 reps;Strengthening;12 reps    Horizontal ABduction Weight (lbs) 1    External Rotation PROM;5 reps;Strengthening;12 reps    External Rotation Weight (lbs) 1    Internal Rotation PROM;5 reps;Strengthening;12 reps  Internal Rotation Weight (lbs) 1    Flexion PROM;5 reps;Strengthening;12 reps    Shoulder Flexion Weight (lbs) 1    ABduction PROM;5 reps;Strengthening;12 reps    Shoulder ABduction Weight (lbs) 1      Shoulder Exercises: Standing   Protraction Theraband;12 reps    Horizontal ABduction Theraband;12 reps    Theraband Level (Shoulder Horizontal ABduction) Level 1 (Yellow)    External Rotation Theraband;12 reps    Theraband Level (Shoulder External Rotation) Level 1 (Yellow)    Internal Rotation Theraband;12 reps    Theraband Level (Shoulder Internal Rotation) Level 1 (Yellow)   towel roll   Flexion Theraband;Limitations;12 reps    Theraband Level (Shoulder Flexion) Level 1 (Yellow)    Flexion Limitations 25% range    Extension Theraband;12 reps    Theraband Level (Shoulder Extension) Level 1 (Yellow)    Row Theraband;12 reps    Theraband Level  (Shoulder Row) Level 1 (Yellow)      Shoulder Exercises: ROM/Strengthening   Over Head Lace seated. Laced chain from height level able to reach before unlacing. Unlaced seated until half way then stood to finish task.    Proximal Shoulder Strengthening, Supine 10X each, 1#, no rest breaks                    OT Education - 10/10/20 1020     Education Details shoulder strengthening with yellow theraband. (er with towel roll, row, extension, protraction, horizontal abduction/adduction)    Person(s) Educated Patient    Methods Explanation;Verbal cues;Handout;Demonstration    Comprehension Verbalized understanding;Returned demonstration              OT Short Term Goals - 10/10/20 1223       OT SHORT TERM GOAL #1   Title Patient will be educated and demonstrate understanding of HEP in order to increase functional use of her LUE during basic daily tasks while increasing ROM and strength.    Time 4    Period Weeks    Status On-going    Target Date 08/24/20      OT SHORT TERM GOAL #2   Title Pt will increase LUE P/ROM to [facility] to improve ability to complete dressing tasks with minimal compensatory strategies.    Time 4    Period Weeks      OT SHORT TERM GOAL #3   Title Pt will increase LUE strength to 3+/5 to improve ability to reach items at waist to chest height during ADLs.    Time 4    Period Weeks    Status Partially Met               OT Long Term Goals - 10/10/20 1224       OT LONG TERM GOAL #1   Title Pt will decrease LUE pain to 3/10 or less to improve ability to use LUE as assist during ADLs and leisure tasks.    Time 8    Period Weeks      OT LONG TERM GOAL #2   Title Pt will decrease LUE fascial restrictions to minimal amounts to improve mobility required for functional reaching tasks.    Time 8    Period Weeks      OT LONG TERM GOAL #3   Title Pt will increase LUE A/ROM to [facility] to improve ability to perform functional reaching overhead and  behind back during dressing and bathing tasks.    Time 8    Period Weeks  Status On-going      OT LONG TERM GOAL #4   Title Pt will increase LUE strength to 4+/5 or greater to improve ability to perform housekeeping tasks using LUE as non-dominant.    Time 8    Period Weeks    Status On-going                   Plan - 10/10/20 1020     Clinical Impression Statement A: no fascial restrictions noted during today's treatment session and manual techniques were not needed. Used yellow theraband for shoulder strengthening with external rotation and flexion being most challenging due to decreased strength. VC for form and technique with modifications provided as needed based on difficulty level. Updated HEP for yellow theraband. Continues to demonstrate difficulty with movements that require shoulder flexion with compensation noted.    Body Structure / Function / Physical Skills ADL;Endurance;UE functional use;Fascial restriction;Muscle spasms;Pain;ROM;IADL;Strength;Edema;Mobility    Plan P: Follow up on HEP. Reassess and discharge.    OT Home Exercise Plan 5/12: standing AA/ROM 6/7: A/ROM 7/5: Yellow theraband shoulder strengthening.    Consulted and Agree with Plan of Care Patient             Patient will benefit from skilled therapeutic intervention in order to improve the following deficits and impairments:   Body Structure / Function / Physical Skills: ADL, Endurance, UE functional use, Fascial restriction, Muscle spasms, Pain, ROM, IADL, Strength, Edema, Mobility       Visit Diagnosis: Stiffness of left shoulder, not elsewhere classified  Acute pain of left shoulder  Stiffness of right shoulder, not elsewhere classified  Other symptoms and signs involving the musculoskeletal system    Problem List Patient Active Problem List   Diagnosis Date Noted   Encounter to establish care 09/19/2020   Rotator cuff arthropathy of left shoulder 07/10/2020   Absolute anemia  10/30/2017   Constipation 06/10/2017   Hemorrhoids 06/10/2017   Cystocele with rectocele 06/10/2017   Rectocele 06/10/2017   Diabetic foot ulcer ([place]) 09/27/2016   CKD (chronic kidney disease), stage III ([place]) 09/27/2016   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[place]) 09/27/2016   Hyperlipidemia 09/27/2016   Diabetic ulcer of foot associated with diabetes mellitus due to underlying condition, limited to breakdown of skin ([place]) 09/27/2016   Postmenopausal 07/19/2015   Essential hypertension, benign 08/11/2012   High cholesterol 08/11/2012   Family hx of colon cancer 08/11/2012   Diabetes ([place]) 08/11/2012   Ailene Ravel, OTR/L,CBIS  [phone]   10/10/2020, 12:25 PM  [place], [place], [address] Phone: [phone]   Fax:  [phone]  Name: Stacy Webb MRN: [id number] Date of Birth: May 29, 1945

## 2020-10-13 ENCOUNTER — Other Ambulatory Visit (HOSPITAL_COMMUNITY): Payer: Medicare Other

## 2020-10-13 ENCOUNTER — Ambulatory Visit (HOSPITAL_COMMUNITY)
Admission: RE | Admit: 2020-10-13 | Discharge: 2020-10-13 | Disposition: A | Discharge status: Home / Self Care | Payer: Medicare Other | Source: Ambulatory Visit | Attending: Nurse Practitioner | Admitting: Nurse Practitioner

## 2020-10-13 ENCOUNTER — Other Ambulatory Visit: Payer: Self-pay

## 2020-10-13 DIAGNOSIS — Z1231 Encounter for screening mammogram for malignant neoplasm of breast: Secondary | ICD-10-CM | POA: Insufficient documentation

## 2020-10-17 ENCOUNTER — Other Ambulatory Visit: Payer: Self-pay

## 2020-10-17 ENCOUNTER — Encounter (HOSPITAL_COMMUNITY): Payer: Self-pay | Admitting: Occupational Therapy

## 2020-10-17 ENCOUNTER — Ambulatory Visit (HOSPITAL_COMMUNITY): Payer: Medicare Other | Admitting: Occupational Therapy

## 2020-10-17 DIAGNOSIS — M25612 Stiffness of left shoulder, not elsewhere classified: Secondary | ICD-10-CM | POA: Diagnosis not present

## 2020-10-17 DIAGNOSIS — M25512 Pain in left shoulder: Secondary | ICD-10-CM

## 2020-10-17 NOTE — Therapy (Signed)
[place], [place], [address] Phone: [phone]   Fax:  [phone]  Occupational Therapy Reassessment, Treatment, Discharge Summary  Patient Details  Name: Stacy Webb MRN: [id number] Date of Birth: Jul 27, 1945 Referring Provider (OT): Dr. Larena Glassman   Encounter Date: 10/17/2020   OT End of Session - 10/17/20 1023     Visit Number 21    Number of Visits 21    Date for OT Re-Evaluation 10/19/20    Authorization Type UHC Medicare, $30 copay    Authorization Time Period no visit limit    Progress Note Due on Visit 26    OT Start Time 0944    OT Stop Time 1018    OT Time Calculation (min) 34 min    Activity Tolerance Patient tolerated treatment well    Behavior During Therapy [facility] for tasks assessed/performed             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[place])    Diabetes mellitus without complication ([place])    Excessive hair growth 07/19/2015   Heart murmur    History of kidney stones    Hyperlipidemia    Hypertension    Neuromuscular disorder ([place])    diabetic neuropathy   Neuropathy    Postmenopausal 07/19/2015    Past Surgical History:  Procedure Laterality Date   ANTERIOR AND POSTERIOR REPAIR N/A 04/28/2018   Procedure: ANTERIOR (CYSTOCELE) AND POSTERIOR REPAIR (RECTOCELE);  Surgeon: Jonnie Kind, MD;  Location: [facility];  Service: Gynecology;  Laterality: N/A;   APPENDECTOMY     CARPAL TUNNEL RELEASE Bilateral 2001   CESAREAN SECTION     COLONOSCOPY N/A 08/28/2012   Procedure: COLONOSCOPY;  Surgeon: Rogene Houston, MD;  Location[facility];  Service: Endoscopy;  Laterality: N/A;  915   COLONOSCOPY N/A 12/25/2017   Procedure: COLONOSCOPY;  Surgeon: Rogene Houston, MD;  [facility]: [facility];  Service: Endoscopy;  Laterality: N/A;  12:45   REVERSE SHOULDER ARTHROPLASTY Left 07/10/2020    Procedure: REVERSE SHOULDER ARTHROPLASTY;  Surgeon: [name], MD;  Location: [facility];  Service: Orthopedics;  Laterality: Left;    There were no vitals filed for this visit.   Subjective Assessment - 10/17/20 0943     Subjective  S: Sometimes I wake up and I've turned on that side to sleep.    Currently in Pain? No/denies                [facility] OT Assessment - 10/17/20 0943       Assessment   Medical Diagnosis s/p left reverse TSA      Precautions   Precautions Shoulder    Type of Shoulder Precautions Phase III: Progress strengthening. Light weights and resistance.      Observation/Other Assessments   Focus on Therapeutic Outcomes (FOTO)  61/100   same as previous     Palpation   Palpation comment min fascial restrictions in left shoulder region      AROM   Overall AROM Comments assessed in supine, external and internal rotation with shoulder adducted    AROM Assessment Site Shoulder    Right/Left Shoulder Left    Left Shoulder Flexion 130 Degrees   125 previous   Left Shoulder ABduction 135 Degrees   100 previous   Left Shoulder Internal Rotation  90 Degrees   same as previous   Left Shoulder External Rotation 35 Degrees   35 previous     PROM   Overall PROM Comments Assessed supine, er/IR adducted    PROM Assessment Site Shoulder    Right/Left Shoulder Left    Left Shoulder Flexion 146 Degrees   140 previous   Left Shoulder ABduction 155 Degrees   150 previous   Left Shoulder Internal Rotation 90 Degrees   same as previous   Left Shoulder External Rotation 65 Degrees   56 previous     Strength   Overall Strength Comments assessed seated, er/IR adducted    Strength Assessment Site Shoulder    Right/Left Shoulder Left    Left Shoulder Flexion 4-/5   3-/5 previous   Left Shoulder ABduction 3+/5   3-/5 previous   Left Shoulder Internal Rotation 4/5   same as previous   Left Shoulder External Rotation 3/5   2+/5 previous                     OT  Treatments/Exercises (OP) - 10/17/20 0946       Exercises   Exercises Shoulder      Shoulder Exercises: Supine   Protraction PROM;5 reps    Horizontal ABduction PROM;5 reps    External Rotation PROM;5 reps    Internal Rotation PROM;5 reps    Flexion PROM;5 reps    ABduction PROM;5 reps      Shoulder Exercises: Seated   Protraction Strengthening;10 reps    Protraction Weight (lbs) 1    Horizontal ABduction Strengthening;10 reps    Horizontal ABduction Weight (lbs) 1    External Rotation Strengthening;10 reps    External Rotation Weight (lbs) 1    Flexion Strengthening;10 reps    Flexion Weight (lbs) 1    Abduction Strengthening;10 reps    ABduction Weight (lbs) 1      Shoulder Exercises: Standing   Protraction Theraband;12 reps    Theraband Level (Shoulder Protraction) Level 1 (Yellow)    Horizontal ABduction Theraband;12 reps    Theraband Level (Shoulder Horizontal ABduction) Level 1 (Yellow)    External Rotation Theraband;12 reps    Theraband Level (Shoulder External Rotation) Level 1 (Yellow)    Extension Theraband;12 reps    Theraband Level (Shoulder Extension) Level 1 (Yellow)    Row Theraband;12 reps    Theraband Level (Shoulder Row) Level 1 (Yellow)                    OT Education - 10/17/20 1012     Education Details reviewed yellow theraband strengthening, reviewed A/ROM with weights for strengthening    Person(s) Educated Patient    Methods Explanation;Verbal cues;Handout;Demonstration    Comprehension Verbalized understanding;Returned demonstration              OT Short Term Goals - 10/17/20 0956       OT SHORT TERM GOAL #1   Title Patient will be educated and demonstrate understanding of HEP in order to increase functional use of her LUE during basic daily tasks while increasing ROM and strength.    Time 4    Period Weeks    Status Achieved    Target Date 08/24/20      OT SHORT TERM GOAL #2   Title Pt will increase LUE P/ROM to [facility]  to improve ability to complete dressing tasks with minimal compensatory strategies.    Time 4    Period Weeks  OT SHORT TERM GOAL #3   Title Pt will increase LUE strength to 3+/5 to improve ability to reach items at waist to chest height during ADLs.    Time 4    Period Weeks    Status Partially Met               OT Long Term Goals - 10/17/20 0956       OT LONG TERM GOAL #1   Title Pt will decrease LUE pain to 3/10 or less to improve ability to use LUE as assist during ADLs and leisure tasks.    Time 8    Period Weeks    Status Achieved      OT LONG TERM GOAL #2   Title Pt will decrease LUE fascial restrictions to minimal amounts to improve mobility required for functional reaching tasks.    Time 8    Period Weeks      OT LONG TERM GOAL #3   Title Pt will increase LUE A/ROM to [facility] to improve ability to perform functional reaching overhead and behind back during dressing and bathing tasks.    Time 8    Period Weeks    Status Partially Met      OT LONG TERM GOAL #4   Title Pt will increase LUE strength to 4+/5 or greater to improve ability to perform housekeeping tasks using LUE as non-dominant.    Time 8    Period Weeks    Status Not Met                   Plan - 10/17/20 1012     Clinical Impression Statement A: Reassessment completed this session, pt has met 2 STGs and 2 LTGs, remaining STG and 1 LTG partially met. Pt has made progress with LUE ROM, strength, and functional use of her arm/shoulder for daily tasks. Pt reports she is only having difficulty with zipping her zippers in the back. Discussed tools available for this task. Reviewed HEP for yellow theraband strengthening and strengthening with weights. Pt with LUE strength greater than or equal to RUE strength at this time. Pt is completing ADLs and leisure tasks with little to no difficulty and is agreeable to discharge with HEP for continued strengthening.    Body Structure / Function / Physical  Skills ADL;Endurance;UE functional use;Fascial restriction;Muscle spasms;Pain;ROM;IADL;Strength;Edema;Mobility    Plan P: Discharge pt    OT Home Exercise Plan 5/12: standing AA/ROM 6/7: A/ROM 7/5: Yellow theraband shoulder strengthening; 7/12: reviewed yellow theraband, educated on adding weights to A/ROM for strengthening    Consulted and Agree with Plan of Care Patient             Patient will benefit from skilled therapeutic intervention in order to improve the following deficits and impairments:   Body Structure / Function / Physical Skills: ADL, Endurance, UE functional use, Fascial restriction, Muscle spasms, Pain, ROM, IADL, Strength, Edema, Mobility       Visit Diagnosis: Stiffness of left shoulder, not elsewhere classified  Acute pain of left shoulder    Problem List Patient Active Problem List   Diagnosis Date Noted   Encounter to establish care 09/19/2020   Rotator cuff arthropathy of left shoulder 07/10/2020   Absolute anemia 10/30/2017   Constipation 06/10/2017   Hemorrhoids 06/10/2017   Cystocele with rectocele 06/10/2017   Rectocele 06/10/2017   Diabetic foot ulcer ([place]) 09/27/2016   CKD (chronic kidney disease), stage III ([place]) 09/27/2016   Charcot foot due  to diabetes mellitus ([place]) 09/27/2016   Hyperlipidemia 09/27/2016   Diabetic ulcer of foot associated with diabetes mellitus due to underlying condition, limited to breakdown of skin ([place]) 09/27/2016   Postmenopausal 07/19/2015   Essential hypertension, benign 08/11/2012   High cholesterol 08/11/2012   Family hx of colon cancer 08/11/2012   Diabetes ([place]) 08/11/2012    Guadelupe Sabin, OTR/L  [phone] 10/17/2020, 10:24 AM  [place], [place], [address] Phone: [phone]   Fax:  [phone]  Name: Stacy Webb MRN: [id number] Date of Birth: October 09, 1945  OCCUPATIONAL THERAPY DISCHARGE SUMMARY  Visits from Start of Care:  21  Current functional level related to goals / functional outcomes: See above. Pt has improved her pain, ROM, strength, and functional use of LUE during ADLs and leisure tasks. Pt is demonstrating LUE functional use equal to or greater than RUE at this time.    Remaining deficits: Decreased strength and activity tolerance   Education / Equipment: HEP for continued LUE strengthening  Patient agrees to discharge. Patient goals were partially met. Patient is being discharged due to meeting the stated rehab goals.

## 2020-10-18 ENCOUNTER — Telehealth: Payer: Self-pay

## 2020-10-18 ENCOUNTER — Other Ambulatory Visit: Payer: Self-pay | Admitting: *Deleted

## 2020-10-18 MED ORDER — METFORMIN HCL 1000 MG PO TABS: 1000.0000 mg | ORAL_TABLET | Freq: Two times a day (BID) | ORAL | 0 refills | Status: DC

## 2020-10-18 MED ORDER — LOSARTAN POTASSIUM 50 MG PO TABS: 50.0000 mg | ORAL_TABLET | Freq: Every day | ORAL | 0 refills | Status: DC

## 2020-10-18 NOTE — Telephone Encounter (Signed)
Patient need med refill  metFORMIN (GLUCOPHAGE) 1000 MG tablet  losartan (COZAAR) 50 MG tablet  Pharmacy: [facility]

## 2020-10-18 NOTE — Telephone Encounter (Signed)
Patient medication sent to pharmacy  

## 2020-10-19 NOTE — Progress Notes (Signed)
Mammogram negative, repeat in 1 year.

## 2020-10-22 ENCOUNTER — Other Ambulatory Visit: Payer: Self-pay

## 2020-10-22 ENCOUNTER — Encounter (HOSPITAL_COMMUNITY): Payer: Self-pay

## 2020-10-22 ENCOUNTER — Emergency Department (HOSPITAL_COMMUNITY)
Admission: EM | Admit: 2020-10-22 | Discharge: 2020-10-22 | Disposition: A | Discharge status: Home / Self Care | Payer: Medicare Other | Attending: Emergency Medicine | Admitting: Emergency Medicine

## 2020-10-22 DIAGNOSIS — E1122 Type 2 diabetes mellitus with diabetic chronic kidney disease: Secondary | ICD-10-CM | POA: Diagnosis not present

## 2020-10-22 DIAGNOSIS — E11621 Type 2 diabetes mellitus with foot ulcer: Secondary | ICD-10-CM | POA: Diagnosis not present

## 2020-10-22 DIAGNOSIS — L0291 Cutaneous abscess, unspecified: Secondary | ICD-10-CM

## 2020-10-22 DIAGNOSIS — L97511 Non-pressure chronic ulcer of other part of right foot limited to breakdown of skin: Secondary | ICD-10-CM | POA: Insufficient documentation

## 2020-10-22 DIAGNOSIS — R2241 Localized swelling, mass and lump, right lower limb: Secondary | ICD-10-CM | POA: Diagnosis present

## 2020-10-22 DIAGNOSIS — L02611 Cutaneous abscess of right foot: Secondary | ICD-10-CM | POA: Diagnosis not present

## 2020-10-22 DIAGNOSIS — N183 Chronic kidney disease, stage 3 unspecified: Secondary | ICD-10-CM | POA: Diagnosis not present

## 2020-10-22 DIAGNOSIS — Z7984 Long term (current) use of oral hypoglycemic drugs: Secondary | ICD-10-CM | POA: Insufficient documentation

## 2020-10-22 DIAGNOSIS — I129 Hypertensive chronic kidney disease with stage 1 through stage 4 chronic kidney disease, or unspecified chronic kidney disease: Secondary | ICD-10-CM | POA: Diagnosis not present

## 2020-10-22 DIAGNOSIS — Z79899 Other long term (current) drug therapy: Secondary | ICD-10-CM | POA: Insufficient documentation

## 2020-10-22 DIAGNOSIS — Z96612 Presence of left artificial shoulder joint: Secondary | ICD-10-CM | POA: Diagnosis not present

## 2020-10-22 MED ORDER — DOXYCYCLINE HYCLATE 100 MG PO TABS
100.0000 mg | ORAL_TABLET | Freq: Once | ORAL | Status: AC
  Administered 2020-10-22: 100 mg via ORAL
  Filled 2020-10-22: qty 1

## 2020-10-22 MED ORDER — DOXYCYCLINE HYCLATE 100 MG PO CAPS: 100.0000 mg | ORAL_CAPSULE | Freq: Two times a day (BID) | ORAL | 0 refills | Status: AC

## 2020-10-22 MED ORDER — DOUBLE ANTIBIOTIC 500-10000 UNIT/GM EX OINT
TOPICAL_OINTMENT | Freq: Once | CUTANEOUS | Status: AC
  Administered 2020-10-22: 1 via TOPICAL
  Filled 2020-10-22: qty 1

## 2020-10-22 NOTE — ED Notes (Signed)
Dr. Arville Go an incision to right foot.

## 2020-10-22 NOTE — ED Notes (Signed)
MSE not signed due to Dr at bedside during triage.

## 2020-10-22 NOTE — ED Triage Notes (Signed)
Pt arrived by POV wanting Doctor to look at right foot ulcer.

## 2020-10-22 NOTE — Discharge Instructions (Addendum)
With the assistance of a small incision we were able to drain the abscess on your foot.  I would like for you to soak your foot in warm soapy water twice a day, keep a sterile dressing on the wound at all times when you are not soaking it, take doxycycline twice a day for 7 days and follow-up with Dr. Doran Durand for a recheck of your foot.  If you should develop increasing redness swelling pain fever or pus from the wound I would like for you to return to the emergency department immediately.

## 2020-10-22 NOTE — ED Provider Notes (Signed)
[facility] EMERGENCY DEPARTMENT Provider Note   CSN: [id number] Arrival date & time: 10/22/20  0827     History Chief Complaint  Patient presents with   Wound Check    Stacy Webb is a 75 y.o. female.   Wound Check  This patient is a very pleasant 75 year old female, she has a known history of diabetes on metformin, she also has a Charcot foot on the right side and unfortunately has suffered with recurrent ulcers over the ball of the foot, she has had to have these drained in the past, she has been under the care of the wound care service as well as Dr. Doran Durand with orthopedics for foot care and wears special orthotic shoes to help with reducing pain and swelling.  Unfortunately over the last week she has developed some recurrent discomfort over the ball of the right foot with associated swelling and bruising and what she feels is some increasing redness though she denies fevers or chills.  She feels as though this may be infected again, because of the increasing pain she would like to have this evaluated.  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[place])    Diabetes mellitus without complication (HCC)    Excessive hair growth 07/19/2015   Heart murmur    History of kidney stones    Hyperlipidemia    Hypertension    Neuromuscular disorder ([place])    diabetic neuropathy   Neuropathy    Postmenopausal 07/19/2015    Patient Active Problem List   Diagnosis Date Noted   Encounter to establish care 09/19/2020   Rotator cuff arthropathy of left shoulder 07/10/2020   Absolute anemia 10/30/2017   Constipation 06/10/2017   Hemorrhoids 06/10/2017   Cystocele with rectocele 06/10/2017   Rectocele 06/10/2017   Diabetic foot ulcer ([place]) 09/27/2016   CKD (chronic kidney disease), stage III ([place]) 09/27/2016    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[place]) 09/27/2016   Hyperlipidemia 09/27/2016   Diabetic ulcer of foot associated with diabetes mellitus due to underlying condition, limited to breakdown of skin ([place]) 09/27/2016   Postmenopausal 07/19/2015   Essential hypertension, benign 08/11/2012   High cholesterol 08/11/2012   Family hx of colon cancer 08/11/2012   Diabetes ([place]) 08/11/2012    Past Surgical History:  Procedure Laterality Date   ANTERIOR AND POSTERIOR REPAIR N/A 04/28/2018   Procedure: ANTERIOR (CYSTOCELE) AND POSTERIOR REPAIR (RECTOCELE);  Surgeon: Jonnie Kind, MD;  Location: [facility] ORS;  Service: Gynecology;  Laterality: N/A;   APPENDECTOMY     CARPAL TUNNEL RELEASE Bilateral 2001   CESAREAN SECTION     COLONOSCOPY N/A 08/28/2012   Procedure: COLONOSCOPY;  Surgeon: Rogene Houston, MD;  Location: [facility] ENDO SUITE;  Service: Endoscopy;  Laterality: N/A;  915   COLONOSCOPY N/A 12/25/2017   Procedure: COLONOSCOPY;  Surgeon: Rogene Houston, MD;  Location: [facility] ENDO SUITE;  Service: Endoscopy;  Laterality: N/A;  12:45   REVERSE SHOULDER ARTHROPLASTY Left 07/10/2020   Procedure: REVERSE SHOULDER ARTHROPLASTY;  Surgeon: [name], MD;  Location: [facility] ORS;  Service: Orthopedics;  Laterality: Left;     OB History     Gravida  2   Para  2   Term  2   Preterm  AB      Living  1      SAB      IAB      Ectopic      Multiple  1   Live Births  3           Family History  Problem Relation Age of Onset   Pneumonia Son    Suicidality Son    Diabetes Daughter    Colon cancer Paternal Aunt     Social History   Tobacco Use   Smoking status: Never   Smokeless tobacco: Never  Vaping Use   Vaping Use: Never used  Substance Use Topics   Alcohol use: No   Drug use: No    Home Medications Prior to Admission medications   Medication Sig Start Date End Date Taking? Authorizing Provider  doxycycline (VIBRAMYCIN) 100 MG capsule Take 1 capsule (100 mg  total) by mouth 2 (two) times daily for 7 days. 10/22/20 10/29/20 Yes Noemi Chapel, MD  ACCU-CHEK AVIVA PLUS test strip  12/05/18   [provider]  atorvastatin (LIPITOR) 20 MG tablet Take 20 mg by mouth daily.    [provider]  diltiazem (CARDIZEM CD) 360 MG 24 hr capsule Take 360 mg by mouth daily.     [provider]  docusate sodium (COLACE) 100 MG capsule Take 200 mg by mouth at bedtime.    [provider]  gabapentin (NEURONTIN) 100 MG capsule Take 100 mg by mouth 2 (two) times daily.     [provider]  hydrochlorothiazide (HYDRODIURIL) 25 MG tablet Take 1 tablet (25 mg total) by mouth daily. 09/19/20   Noreene Larsson, NP  losartan (COZAAR) 50 MG tablet Take 1 tablet (50 mg total) by mouth daily. Take 50 mg by mouth daily. 10/18/20   Noreene Larsson, NP  metFORMIN (GLUCOPHAGE) 1000 MG tablet Take 1 tablet (1,000 mg total) by mouth 2 (two) times daily with a meal. Take 1,000 mg by mouth 2 (two) times daily with a meal. 10/18/20   Noreene Larsson, NP  vitamin B-12 (CYANOCOBALAMIN) 500 MCG tablet Take 500 mcg by mouth 2 (two) times daily.     [provider]  conjugated estrogens (PREMARIN) vaginal cream Place 3.15 Applicatorfuls vaginally 3 (three) times a week. For vaginal thinning and irritation in preop timeframe Patient not taking: Reported on 06/21/2019 03/16/18 03/05/20  Jonnie Kind, MD  ferrous sulfate 325 (65 FE) MG tablet Take 325 mg by mouth daily with breakfast.  Patient not taking: Reported on 11/02/2019  03/05/20  [provider]    Allergies    Clindamycin/lincomycin and Enalapril  Review of Systems   Review of Systems  Constitutional:  Negative for fever.  Skin:  Positive for rash.   Physical Exam Updated Vital Signs BP (!) 195/81   Pulse 88   Temp 98.1 F (36.7 C) (Oral)   Resp 16   Ht 1.651 m (5\' 5" )   Wt 55.3 kg   SpO2 100%   BMI 20.30 kg/m   Physical Exam Vitals and nursing note reviewed.   Constitutional:      Appearance: She is well-developed. She is not diaphoretic.  HENT:     Head: Normocephalic and atraumatic.  Eyes:     General:        Right eye: No discharge.        Left eye: No discharge.     Conjunctiva/sclera: Conjunctivae normal.  Pulmonary:     Effort:  Pulmonary effort is normal. No respiratory distress.  Skin:    General: Skin is warm and dry.     Findings: No erythema or rash.     Comments: On the plantar aspect of the right foot there appears to be a callus that has some hematopurulent material, there is some redness surrounding this area and a small streak of redness on the dorsum of the right foot  Neurological:     Mental Status: She is alert.     Coordination: Coordination normal.    ED Results / Procedures / Treatments   Labs (all labs ordered are listed, but only abnormal results are displayed) Labs Reviewed - No data to display  EKG None  Radiology No results found.  Procedures .Marland KitchenIncision and Drainage  Date/Time: 10/22/2020 9:13 AM Performed by: Noemi Chapel, MD Authorized by: Noemi Chapel, MD   Consent:    Consent obtained:  Verbal   Consent given by:  Patient   Risks, benefits, and alternatives were discussed: yes     Risks discussed:  Bleeding, damage to other organs, incomplete drainage, infection and pain   Alternatives discussed:  Delayed treatment Universal protocol:    Procedure explained and questions answered to patient or proxy's satisfaction: yes     Relevant documents present and verified: yes     Required blood products, implants, devices, and special equipment available: yes     Site/side marked: yes     Immediately prior to procedure, a time out was called: yes     Patient identity confirmed:  Verbally with patient Location:    Type:  Abscess   Size:  4 cm   Location:  Lower extremity   Lower extremity location:  Foot   Foot location:  R foot Pre-procedure details:    Skin preparation:   Betadine Anesthesia:    Anesthesia method:  None Procedure type:    Complexity:  Simple Procedure details:    Incision types:  Single straight   Incision depth:  Submucosal   Wound management:  Probed and deloculated   Drainage:  Purulent   Drainage amount:  Moderate   Wound treatment:  Wound left open   Packing materials:  None Post-procedure details:    Procedure completion:  Tolerated well, no immediate complications Comments:       Incised well, purulent and bloody material removed, patient tolerated this extremely well, started on doxycycline, stable for discharge     Medications Ordered in ED Medications  doxycycline (VIBRA-TABS) tablet 100 mg (has no administration in time range)  polymixin-bacitracin (POLYSPORIN) ointment (has no administration in time range)    ED Course  I have reviewed the triage vital signs and the nursing notes.  Pertinent labs & imaging results that were available during my care of the patient were reviewed by me and considered in my medical decision making (see chart for details).    MDM Rules/Calculators/A&P                          This patient is well-appearing, she does have what appears to be an infection in the right foot, there is possibly a small abscess or at least a fluid collection that will need to be drained.  I talked to the patient extensively about the risk benefits and alternatives of incising through a wound that has been difficult to heal however given her increasing pain in the evidence of redness streaking on the dorsum of the foot I  think it is necessary.  She has excepted the risks of this and states that she wants to have it drained understanding this may be a poorly healing wound.  Plan for doxycycline after incision and drainage   Final Clinical Impression(s) / ED Diagnoses Final diagnoses:  Abscess    Rx / DC Orders ED Discharge Orders          Ordered    doxycycline (VIBRAMYCIN) 100 MG capsule  2 times daily         10/22/20 0910             Noemi Chapel, MD 10/22/20 [phone]

## 2020-10-26 ENCOUNTER — Other Ambulatory Visit: Payer: Self-pay

## 2020-10-26 ENCOUNTER — Telehealth (INDEPENDENT_AMBULATORY_CARE_PROVIDER_SITE_OTHER): Payer: Medicare Other | Admitting: Family Medicine

## 2020-10-26 DIAGNOSIS — E7800 Pure hypercholesterolemia, unspecified: Secondary | ICD-10-CM

## 2020-10-26 DIAGNOSIS — I10 Essential (primary) hypertension: Secondary | ICD-10-CM

## 2020-10-26 DIAGNOSIS — D649 Anemia, unspecified: Secondary | ICD-10-CM

## 2020-10-26 DIAGNOSIS — E08621 Diabetes mellitus due to underlying condition with foot ulcer: Secondary | ICD-10-CM

## 2020-10-26 DIAGNOSIS — R634 Abnormal weight loss: Secondary | ICD-10-CM

## 2020-10-26 DIAGNOSIS — R110 Nausea: Secondary | ICD-10-CM

## 2020-10-26 DIAGNOSIS — K5790 Diverticulosis of intestine, part unspecified, without perforation or abscess without bleeding: Secondary | ICD-10-CM

## 2020-10-26 DIAGNOSIS — E785 Hyperlipidemia, unspecified: Secondary | ICD-10-CM

## 2020-10-26 DIAGNOSIS — K8020 Calculus of gallbladder without cholecystitis without obstruction: Secondary | ICD-10-CM | POA: Diagnosis not present

## 2020-10-26 DIAGNOSIS — Z1159 Encounter for screening for other viral diseases: Secondary | ICD-10-CM

## 2020-10-26 DIAGNOSIS — E611 Iron deficiency: Secondary | ICD-10-CM

## 2020-10-26 DIAGNOSIS — L97501 Non-pressure chronic ulcer of other part of unspecified foot limited to breakdown of skin: Secondary | ICD-10-CM

## 2020-10-26 DIAGNOSIS — N183 Chronic kidney disease, stage 3 unspecified: Secondary | ICD-10-CM

## 2020-10-26 DIAGNOSIS — E559 Vitamin D deficiency, unspecified: Secondary | ICD-10-CM

## 2020-10-26 DIAGNOSIS — E11610 Type 2 diabetes mellitus with diabetic neuropathic arthropathy: Secondary | ICD-10-CM

## 2020-10-26 NOTE — Patient Instructions (Addendum)
F/U with PCP in 2 to weeks re evaluate anemia , nausea and fatigue, call if you need [place] sooner  CBC, iron , ferritin, cmp and  eGFR, HBA1C, Hep C screen and vit D today   No medication changes at this time, unless you need  nausea  medication

## 2020-10-26 NOTE — Progress Notes (Signed)
Virtual Visit via Telephone Note  I connected with Dellia Beckwith on 10/26/20 at 10:20 AM EDT by telephone and verified that I am speaking with the correct person using two identifiers.  Location: Patient: home Provider: office   I discussed the limitations, risks, security and privacy concerns of performing an evaluation and management service by telephone and the availability of in person appointments. I also discussed with the patient that there may be a patient responsible charge related to this service. The patient expressed understanding and agreed to proceed.   History of Present Illness:   c/o nausea , fatigue , poor appetite, no chills or fever, no urinary symptoms, she is diabetic and has been treated by Podiatry since 7/17 for ulcer  on foot, but just does not feel well Observations/Objective: There were no vitals taken for this visit. Good communication with no confusion and intact memory. Alert and oriented x 3 No signs of respiratory distress during speech   Assessment and Plan: CKD (chronic kidney disease) stage 3, GFR 30-59 ml/min (HCC) Dehydrated with nausea, improve hydration state and rept,, if persits  may need to change diabetic med to tradjenta, will f/u with PCP  Absolute anemia Iron level normal, check uopdated HB, and further evaluation based on result, PCP to follow  Diabetes Updated lab needed at/ before next visit. Check HBA1C  Diabetic ulcer of foot associated with diabetes mellitus due to underlying condition, limited to breakdown of skin ([place]) Management pe rpodiatry  Cholelithiasis C/o nausea and weight loss, surical consult indicated, will defer to PCP   Follow Up Instructions:    I discussed the assessment and treatment plan with the patient. The patient was provided an opportunity to ask questions and all were answered. The patient agreed with the plan and demonstrated an understanding of the instructions.   The patient was advised to  call back or seek an in-person evaluation if the symptoms worsen or if the condition fails to improve as anticipated.  I provided 24 minutes of non-face-to-face time during this encounter.   Tula Nakayama, MD

## 2020-10-28 LAB — CMP14+EGFR
ALT: 11 IU/L (ref 0–32)
AST: 20 IU/L (ref 0–40)
Albumin/Globulin Ratio: 1.8 (ref 1.2–2.2)
Albumin: 4.5 g/dL (ref 3.7–4.7)
Alkaline Phosphatase: 70 IU/L (ref 44–121)
BUN/Creatinine Ratio: 21 (ref 12–28)
BUN: 35 mg/dL — ABNORMAL HIGH (ref 8–27)
Bilirubin Total: 0.2 mg/dL (ref 0.0–1.2)
CO2: 21 mmol/L (ref 20–29)
Calcium: 9.9 mg/dL (ref 8.7–10.3)
Chloride: 99 mmol/L (ref 96–106)
Creatinine, Ser: 1.66 mg/dL — ABNORMAL HIGH (ref 0.57–1.00)
Globulin, Total: 2.5 g/dL (ref 1.5–4.5)
Glucose: 82 mg/dL (ref 65–99)
Potassium: 4.9 mmol/L (ref 3.5–5.2)
Sodium: 138 mmol/L (ref 134–144)
Total Protein: 7 g/dL (ref 6.0–8.5)
eGFR: 32 mL/min/{1.73_m2} — ABNORMAL LOW (ref >59)

## 2020-10-28 LAB — CBC WITH DIFFERENTIAL/PLATELET
Basophils Absolute: 0.1 10*3/uL (ref 0.0–0.2)
Basos: 1 %
EOS (ABSOLUTE): 0.2 10*3/uL (ref 0.0–0.4)
Eos: 2 %
Hematocrit: 30.9 % — ABNORMAL LOW (ref 34.0–46.6)
Hemoglobin: 9.8 g/dL — ABNORMAL LOW (ref 11.1–15.9)
Immature Grans (Abs): 0 10*3/uL (ref 0.0–0.1)
Immature Granulocytes: 0 %
Lymphocytes Absolute: 2.6 10*3/uL (ref 0.7–3.1)
Lymphs: 34 %
MCH: 25.7 pg — ABNORMAL LOW (ref 26.6–33.0)
MCHC: 31.7 g/dL (ref 31.5–35.7)
MCV: 81 fL (ref 79–97)
Monocytes Absolute: 1 10*3/uL — ABNORMAL HIGH (ref 0.1–0.9)
Monocytes: 12 %
Neutrophils Absolute: 4 10*3/uL (ref 1.4–7.0)
Neutrophils: 51 %
Platelets: 301 10*3/uL (ref 150–450)
RBC: 3.82 x10E6/uL (ref 3.77–5.28)
RDW: 14.4 % (ref 11.7–15.4)
WBC: 7.9 10*3/uL (ref 3.4–10.8)

## 2020-10-28 LAB — HEPATITIS C ANTIBODY: Hep C Virus Ab: 0.1 s/co ratio (ref 0.0–0.9)

## 2020-10-28 LAB — VITAMIN D 25 HYDROXY (VIT D DEFICIENCY, FRACTURES): Vit D, 25-Hydroxy: 47.8 ng/mL (ref 30.0–100.0)

## 2020-10-28 LAB — HEMOGLOBIN A1C
Est. average glucose Bld gHb Est-mCnc: 146 mg/dL
Hgb A1c MFr Bld: 6.7 % — ABNORMAL HIGH (ref 4.8–5.6)

## 2020-10-28 LAB — IRON: Iron: 54 ug/dL (ref 27–139)

## 2020-10-28 LAB — FERRITIN: Ferritin: 295 ng/mL — ABNORMAL HIGH (ref 15–150)

## 2020-10-29 ENCOUNTER — Encounter: Payer: Self-pay | Admitting: Family Medicine

## 2020-10-29 DIAGNOSIS — K5790 Diverticulosis of intestine, part unspecified, without perforation or abscess without bleeding: Secondary | ICD-10-CM | POA: Insufficient documentation

## 2020-10-29 DIAGNOSIS — K8020 Calculus of gallbladder without cholecystitis without obstruction: Secondary | ICD-10-CM | POA: Insufficient documentation

## 2020-10-29 NOTE — Assessment & Plan Note (Signed)
Dehydrated with nausea, improve hydration state and rept,, if persits  may need to change diabetic med to tradjenta, will f/u with PCP

## 2020-10-29 NOTE — Assessment & Plan Note (Signed)
Iron level normal, check uopdated HB, and further evaluation based on result, PCP to follow

## 2020-10-29 NOTE — Assessment & Plan Note (Signed)
C/o nausea and weight loss, surical consult indicated, will defer to PCP

## 2020-10-29 NOTE — Assessment & Plan Note (Signed)
Updated lab needed at/ before next visit. Check HBA1C

## 2020-10-29 NOTE — Assessment & Plan Note (Signed)
Management pe rpodiatry

## 2020-10-30 ENCOUNTER — Other Ambulatory Visit: Payer: Self-pay | Admitting: Nurse Practitioner

## 2020-10-30 ENCOUNTER — Telehealth: Payer: Self-pay

## 2020-10-30 DIAGNOSIS — K8020 Calculus of gallbladder without cholecystitis without obstruction: Secondary | ICD-10-CM

## 2020-10-30 NOTE — Progress Notes (Signed)
Yes. Let's see her sooner.

## 2020-10-30 NOTE — Telephone Encounter (Signed)
See other note

## 2020-10-30 NOTE — Telephone Encounter (Signed)
Patient called requesting lab results from last week ph# [phone]

## 2020-10-30 NOTE — Progress Notes (Signed)
I put in surgical consult for gallstones.

## 2020-10-31 ENCOUNTER — Telehealth: Payer: Self-pay

## 2020-10-31 NOTE — Telephone Encounter (Signed)
See other notes

## 2020-10-31 NOTE — Telephone Encounter (Signed)
Patient returning call. Call back # [phone]

## 2020-10-31 NOTE — Telephone Encounter (Signed)
Spoke with pt   Appt made

## 2020-11-03 ENCOUNTER — Ambulatory Visit (INDEPENDENT_AMBULATORY_CARE_PROVIDER_SITE_OTHER): Payer: Medicare Other | Admitting: Nurse Practitioner

## 2020-11-03 ENCOUNTER — Other Ambulatory Visit: Payer: Self-pay

## 2020-11-03 ENCOUNTER — Encounter: Payer: Self-pay | Admitting: Nurse Practitioner

## 2020-11-03 VITALS — BP 124/69 | HR 70 | Temp 97.7°F | Ht 65.0 in | Wt 123.0 lb

## 2020-11-03 DIAGNOSIS — N183 Chronic kidney disease, stage 3 unspecified: Secondary | ICD-10-CM | POA: Diagnosis not present

## 2020-11-03 DIAGNOSIS — E08621 Diabetes mellitus due to underlying condition with foot ulcer: Secondary | ICD-10-CM

## 2020-11-03 DIAGNOSIS — I10 Essential (primary) hypertension: Secondary | ICD-10-CM

## 2020-11-03 DIAGNOSIS — E11610 Type 2 diabetes mellitus with diabetic neuropathic arthropathy: Secondary | ICD-10-CM

## 2020-11-03 DIAGNOSIS — R110 Nausea: Secondary | ICD-10-CM | POA: Insufficient documentation

## 2020-11-03 DIAGNOSIS — L97501 Non-pressure chronic ulcer of other part of unspecified foot limited to breakdown of skin: Secondary | ICD-10-CM

## 2020-11-03 MED ORDER — ONDANSETRON 4 MG PO TBDP: 4.0000 mg | ORAL_TABLET | Freq: Three times a day (TID) | ORAL | 0 refills | Status: DC | PRN

## 2020-11-03 MED ORDER — GLIMEPIRIDE 1 MG PO TABS: 1.0000 mg | ORAL_TABLET | Freq: Every day | ORAL | 1 refills | Status: DC

## 2020-11-03 MED ORDER — DILTIAZEM HCL ER COATED BEADS 360 MG PO CP24: 360.0000 mg | ORAL_CAPSULE | Freq: Every day | ORAL | 3 refills | Status: DC

## 2020-11-03 NOTE — Patient Instructions (Signed)
Keep a log of your daily blood sugars; return to clinic sooner if blood sugar is consistently < 80 or > 180.

## 2020-11-03 NOTE — Assessment & Plan Note (Signed)
-  she is followed by nephrology, Dr. Theador Hawthorne

## 2020-11-03 NOTE — Assessment & Plan Note (Signed)
-  STOP metformin d/t poor renal fcn -we discussed tradjenta vs glipizide; -Rx. Glimipiride because she has had recent nausea; don't want DPP-4 making that worse

## 2020-11-03 NOTE — Assessment & Plan Note (Signed)
BP Readings from Last 3 Encounters:  11/03/20 124/69  10/22/20 (!) 195/81  09/19/20 117/66   -well controlled -no med changes today

## 2020-11-03 NOTE — Assessment & Plan Note (Addendum)
-  unsure of etiology -has hx of gallstones and has surgical consult -LFTs and [facility] were ok -referral to GI -Rx. zofran

## 2020-11-03 NOTE — Assessment & Plan Note (Signed)
-  changing from metformin to glimipiride -keep logs of blood sugar; follow-up in 1 month

## 2020-11-03 NOTE — Progress Notes (Signed)
Acute Office Visit  Subjective:    Patient ID: Stacy Webb, female    DOB: 1945-10-23, 75 y.o.   MRN: [id number]  Chief Complaint  Patient presents with   Follow-up    Recent labs, has been feeling nauseous and vomiting. Feels some better today.    HPI Patient is in today for lab follow-up. She recently saw Stacy Webb for nausea and weight loss. She had May 2022 ultrasound that showed gallstones present, but no acute issues.  She has upcoming appointment with surgery for this.  She states that she has had intermittent nausea. Nothing that she can think of provokes it and it is not related to meals.  She has vomited once, but usually it is just dry heaves and feeling like she will vomit. Denies changes in stool color or abdominal pain.  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[place])    Diabetes mellitus without complication (HCC)    Excessive hair growth 07/19/2015   Heart murmur    History of kidney stones    Hyperlipidemia    Hypertension    Neuromuscular disorder ([place])    diabetic neuropathy   Neuropathy    Postmenopausal 07/19/2015    Past Surgical History:  Procedure Laterality Date   ANTERIOR AND POSTERIOR REPAIR N/A 04/28/2018   Procedure: ANTERIOR (CYSTOCELE) AND POSTERIOR REPAIR (RECTOCELE);  Surgeon: Stacy Kind, MD;  Location: [facility];  Service: Gynecology;  Laterality: N/A;   APPENDECTOMY     CARPAL TUNNEL RELEASE Bilateral June 26, 1999   CESAREAN SECTION     COLONOSCOPY N/A 08/28/2012   Procedure: COLONOSCOPY;  Surgeon: Stacy Houston, MD;  Location: [facility];  Service: Endoscopy;  Laterality: N/A;  915   COLONOSCOPY N/A 12/25/2017   Procedure: COLONOSCOPY;  Surgeon: Stacy Houston, MD;  Location: [facility];  Service: Endoscopy;  Laterality: N/A;  12:45   REVERSE SHOULDER ARTHROPLASTY Left 07/10/2020   Procedure: REVERSE SHOULDER ARTHROPLASTY;  Surgeon: Stacy Rasmussen,  MD;  Location: [facility];  Service: Orthopedics;  Laterality: Left;    Family History  Problem Relation Age of Onset   Pneumonia Son    Suicidality Son    Diabetes Daughter    Colon cancer Paternal Aunt     Social History   Socioeconomic History   Marital status: Widowed    Spouse name: Not on file   Number of children: 3   Years of education: Not on file   Highest education level: Not on file  Occupational History   Occupation: [employer]    Comment: Medical Records  Tobacco Use   Smoking status: Never   Smokeless tobacco: Never  Vaping Use   Vaping Use: Never used  Substance and Sexual Activity   Alcohol use: No   Drug use: No   Sexual activity: Not Currently    Birth control/protection: Post-menopausal  Other Topics Concern   Not on file  Social History Narrative   Husband had ALS and passed away.Twin boys, but one died at 78 months and her other son died in 06-25-14.      Living daughter- lives with pt.   Social Determinants of Health   Financial Resource Strain: Low Risk    Difficulty of Paying Living Expenses: Not hard at all  Food Insecurity: No Food Insecurity   Worried About Charity fundraiser in the  Last Year: Never true   Ran Out of Food in the Last Year: Never true  Transportation Needs: No Transportation Needs   Lack of Transportation (Medical): No   Lack of Transportation (Non-Medical): No  Physical Activity: Insufficiently Active   Days of Exercise per Week: 2 days   Minutes of Exercise per Session: 60 min  Stress: No Stress Concern Present   Feeling of Stress : Not at all  Social Connections: Moderately Isolated   Frequency of Communication with Friends and Family: Three times a week   Frequency of Social Gatherings with Friends and Family: Twice a week   Attends Religious Services: More than 4 times per year   Active Member of [employer] or Organizations: No   Attends Archivist Meetings: Never   Marital Status: Widowed  Human resources officer Violence:  Not At Risk   Fear of Current or Ex-Partner: No   Emotionally Abused: No   Physically Abused: No   Sexually Abused: No    Outpatient Medications Prior to Visit  Medication Sig Dispense Refill   ACCU-CHEK AVIVA PLUS test strip      atorvastatin (LIPITOR) 20 MG tablet Take 20 mg by mouth daily.     diltiazem (CARDIZEM CD) 360 MG 24 hr capsule Take 360 mg by mouth daily.      docusate sodium (COLACE) 100 MG capsule Take 200 mg by mouth at bedtime.     hydrochlorothiazide (HYDRODIURIL) 25 MG tablet Take 1 tablet (25 mg total) by mouth daily. 90 tablet 1   losartan (COZAAR) 50 MG tablet Take 1 tablet (50 mg total) by mouth daily. Take 50 mg by mouth daily. 90 tablet 0   vitamin B-12 (CYANOCOBALAMIN) 500 MCG tablet Take 500 mcg by mouth 2 (two) times daily.      metFORMIN (GLUCOPHAGE) 1000 MG tablet Take 1 tablet (1,000 mg total) by mouth 2 (two) times daily with a meal. Take 1,000 mg by mouth 2 (two) times daily with a meal. 90 tablet 0   gabapentin (NEURONTIN) 100 MG capsule Take 100 mg by mouth 2 (two) times daily.  (Patient not taking: Reported on 11/03/2020)     No facility-administered medications prior to visit.    Allergies  Allergen Reactions   Clindamycin/Lincomycin Other (See Comments)    Mouth taste like metal   Enalapril Other (See Comments) and Cough    Review of Systems  Constitutional: Negative.   Respiratory: Negative.    Cardiovascular: Negative.   Gastrointestinal:  Positive for nausea and vomiting. Negative for abdominal pain, blood in stool, constipation and diarrhea.      Objective:    Physical Exam Constitutional:      Appearance: Normal appearance.  Cardiovascular:     Rate and Rhythm: Normal rate and regular rhythm.     Pulses: Normal pulses.     Heart sounds: Normal heart sounds.  Pulmonary:     Effort: Pulmonary effort is normal.     Breath sounds: Normal breath sounds.  Abdominal:     General: Abdomen is flat. There is no distension.      Palpations: Abdomen is soft. There is no mass.     Tenderness: There is no abdominal tenderness. There is no guarding or rebound.     Hernia: No hernia is present.  Neurological:     Mental Status: She is alert.    BP 124/69 (BP Location: Right Arm, Patient Position: Sitting, Cuff Size: Normal)   Pulse 70   Temp 97.7 F (36.5  C) (Temporal)   Ht '5\' 5"'  (1.651 m)   Wt 123 lb (55.8 kg)   SpO2 97%   BMI 20.47 kg/m  Wt Readings from Last 3 Encounters:  11/03/20 123 lb (55.8 kg)  10/22/20 122 lb (55.3 kg)  09/19/20 130 lb (59 kg)    Health Maintenance Due  Topic Date Due   FOOT EXAM  Never done   OPHTHALMOLOGY EXAM  Never done   Zoster Vaccines- Shingrix (1 of 2) Never done   PNA vac Low Risk Adult (1 of 2 - PCV13) Never done    There are no preventive care reminders to display for this patient.   No results found for: TSH Lab Results  Component Value Date   WBC 7.9 10/26/2020   HGB 9.8 (L) 10/26/2020   HCT 30.9 (L) 10/26/2020   MCV 81 10/26/2020   PLT 301 10/26/2020   Lab Results  Component Value Date   NA 138 10/26/2020   K 4.9 10/26/2020   CO2 21 10/26/2020   GLUCOSE 82 10/26/2020   BUN 35 (H) 10/26/2020   CREATININE 1.66 (H) 10/26/2020   BILITOT 0.2 10/26/2020   ALKPHOS 70 10/26/2020   AST 20 10/26/2020   ALT 11 10/26/2020   PROT 7.0 10/26/2020   ALBUMIN 4.5 10/26/2020   CALCIUM 9.9 10/26/2020   ANIONGAP 11 07/07/2020   EGFR 32 (L) 10/26/2020   No results found for: CHOL No results found for: HDL No results found for: LDLCALC No results found for: TRIG No results found for: CHOLHDL Lab Results  Component Value Date   HGBA1C 6.7 (H) 10/26/2020       Assessment & Plan:   Problem List Items Addressed This Visit       Cardiovascular and Mediastinum   Essential hypertension, benign    BP Readings from Last 3 Encounters:  11/03/20 124/69  10/22/20 (!) 195/81  09/19/20 117/66  -well controlled -no med changes today        Endocrine    Diabetes ([place]) - Primary    -changing from metformin to glimipiride -keep logs of blood sugar; follow-up in 1 month       Relevant Medications   glimepiride (AMARYL) 1 MG tablet   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 Glimipiride because she has had recent nausea; don't want DPP-4 making that worse       Relevant Medications   glimepiride (AMARYL) 1 MG tablet     Genitourinary   CKD (chronic kidney disease) stage 3, GFR 30-59 ml/min (HCC)    -she is followed by nephrology, Dr. Theador Hawthorne         Other   Nausea    -unsure of etiology -has hx of gallstones and has surgical consult -LFTs and [facility] were ok -referral to GI -Rx. zofran       Relevant Medications   ondansetron (ZOFRAN-ODT) 4 MG disintegrating tablet   Other Relevant Orders   Ambulatory referral to Gastroenterology     Meds ordered this encounter  Medications   glimepiride (AMARYL) 1 MG tablet    Sig: Take 1 tablet (1 mg total) by mouth daily with breakfast.    Dispense:  30 tablet    Refill:  1   ondansetron (ZOFRAN-ODT) 4 MG disintegrating tablet    Sig: Take 1 tablet (4 mg total) by mouth every  8 (eight) hours as needed for nausea or vomiting.    Dispense:  20 tablet    Refill:  0      Noreene Larsson, NP

## 2020-11-07 ENCOUNTER — Other Ambulatory Visit: Payer: Self-pay | Admitting: Nurse Practitioner

## 2020-11-07 ENCOUNTER — Encounter (INDEPENDENT_AMBULATORY_CARE_PROVIDER_SITE_OTHER): Payer: Self-pay | Admitting: *Deleted

## 2020-11-07 NOTE — Progress Notes (Signed)
-  refilled cardizem at 11/03/20 visit -got letter from [facility] with copy of my rx; unsure what this is about -check with pt at next OV to see if she got the medicine -possibly just a notification that a previous provider had written for cardizem

## 2020-11-14 ENCOUNTER — Telehealth: Payer: Self-pay | Admitting: Nurse Practitioner

## 2020-11-14 NOTE — Telephone Encounter (Signed)
Pt would like to know if she could cut the Diltiazem '240mg'$  in half it came from [facility] and she did not want to send it back. Blood pressure has been normal. And would like to reduce to Diltiazem '120mg'$  if possible.

## 2020-11-14 NOTE — Telephone Encounter (Signed)
Our med list has 360 mg cardizem extended release capsules... those can't be cut up. I think her insurance company sent a letter to get a different formulation. What dose has she been taking, because obviously it isn't what is reported on her med list.

## 2020-11-14 NOTE — Telephone Encounter (Signed)
PT wants to know if she can cut some medication in half

## 2020-11-17 ENCOUNTER — Other Ambulatory Visit: Payer: Self-pay

## 2020-11-17 ENCOUNTER — Other Ambulatory Visit: Payer: Self-pay | Admitting: Nurse Practitioner

## 2020-11-17 MED ORDER — DILTIAZEM HCL ER COATED BEADS 360 MG PO TB24: 360.0000 mg | ORAL_TABLET | Freq: Every day | ORAL | 1 refills | Status: DC

## 2020-11-17 NOTE — Telephone Encounter (Signed)
Her BP was fine at her last BP check. I sent in the tabs, but they are long-acting tabs, so they can't be cut in half unless they are scored. Is she having lightheadedness or dizziness?

## 2020-11-17 NOTE — Telephone Encounter (Signed)
Pt informed. She is not having any dizziness or lightheadedness.

## 2020-11-17 NOTE — Telephone Encounter (Signed)
Pt says she is taking Diltiazem 360 tabs, not caps, and wants to cut this in half because she is worried it's going to drop her bp too low. Please advise.

## 2020-11-21 ENCOUNTER — Encounter: Payer: Self-pay | Admitting: General Surgery

## 2020-11-21 ENCOUNTER — Ambulatory Visit (INDEPENDENT_AMBULATORY_CARE_PROVIDER_SITE_OTHER): Payer: Medicare Other | Admitting: General Surgery

## 2020-11-21 ENCOUNTER — Other Ambulatory Visit: Payer: Self-pay

## 2020-11-21 VITALS — BP 173/81 | HR 68 | Temp 98.6°F | Resp 14 | Ht 65.0 in | Wt 128.0 lb

## 2020-11-21 DIAGNOSIS — K8020 Calculus of gallbladder without cholecystitis without obstruction: Secondary | ICD-10-CM | POA: Diagnosis not present

## 2020-11-21 NOTE — Patient Instructions (Signed)
Call with any concerns or changes.

## 2020-11-21 NOTE — Progress Notes (Signed)
[facility] History and Physical  Reason for Referral: Gallstones, weight loss nausea/vomiting  Referring Physician: Demetrius Revel NP   Chief Complaint   New Patient (Initial Visit)     Stacy Webb is a 75 y.o. female.  HPI: Stacy Webb is a 75 yo who reports she received her second booster of COVID and then started to have extreme nausea and vomiting. She says she lost about 15 lbs during this time and that this was in the end of July or so. She says now she is better and has no nausea or vomiting. Her weight got down to 119 and is up to 125. She is normally at 130. She has never had any abdominal pain, no RUQ pain, no pain with eating. She says she just really did not have an appetite or want to eat during that period. She had an [place] 08/2020 that demonstrated some stones in her gallbladder. She is diabetic and has diabetic foot disease.   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[place])    Diabetes mellitus without complication (HCC)    Excessive hair growth 07/19/2015   Heart murmur    History of kidney stones    Hyperlipidemia    Hypertension    Neuromuscular disorder ([place])    diabetic neuropathy   Neuropathy    Postmenopausal 07/19/2015    Past Surgical History:  Procedure Laterality Date   ANTERIOR AND POSTERIOR REPAIR N/A 04/28/2018   Procedure: ANTERIOR (CYSTOCELE) AND POSTERIOR REPAIR (RECTOCELE);  Surgeon: Jonnie Kind, MD;  Location: [facility];  Service: Gynecology;  Laterality: N/A;   APPENDECTOMY     CARPAL TUNNEL RELEASE Bilateral 2001   CESAREAN SECTION     COLONOSCOPY N/A 08/28/2012   Procedure: COLONOSCOPY;  Surgeon: Rogene Houston, MD;  Location: [facility];  Service: Endoscopy;  Laterality: N/A;  915   COLONOSCOPY N/A 12/25/2017   Procedure: COLONOSCOPY;  Surgeon: Rogene Houston, MD;  Location: [facility];  Service: Endoscopy;  Laterality: N/A;  12:45    REVERSE SHOULDER ARTHROPLASTY Left 07/10/2020   Procedure: REVERSE SHOULDER ARTHROPLASTY;  Surgeon: [name], MD;  Location: [facility];  Service: Orthopedics;  Laterality: Left;    Family History  Problem Relation Age of Onset   Pneumonia Son    Suicidality Son    Diabetes Daughter    Colon cancer Paternal Aunt     Social History   Tobacco Use   Smoking status: Never   Smokeless tobacco: Never  Vaping Use   Vaping Use: Never used  Substance Use Topics   Alcohol use: No   Drug use: No    Medications: I have reviewed the patient's current medications. Allergies as of 11/21/2020       Reactions   Clindamycin/lincomycin Other (See Comments)   Mouth taste like metal   Enalapril Other (See Comments), Cough        Medication List        Accurate as of November 21, 2020 11:48 AM. If you have any questions, ask your nurse or doctor.          Accu-Chek Aviva Plus test strip Generic drug: glucose blood   atorvastatin 20 MG tablet Commonly known as: LIPITOR Take 20 mg by mouth daily.   diltiazem 360 MG 24 hr tablet Commonly known as: CARDIZEM LA Take 1 tablet (  360 mg total) by mouth daily.   docusate sodium 100 MG capsule Commonly known as: COLACE Take 200 mg by mouth at bedtime.   glimepiride 1 MG tablet Commonly known as: AMARYL Take 1 tablet (1 mg total) by mouth daily with breakfast.   hydrochlorothiazide 25 MG tablet Commonly known as: HYDRODIURIL Take 1 tablet (25 mg total) by mouth daily.   losartan 50 MG tablet Commonly known as: COZAAR Take 1 tablet (50 mg total) by mouth daily. Take 50 mg by mouth daily.   ondansetron 4 MG disintegrating tablet Commonly known as: ZOFRAN-ODT Take 1 tablet (4 mg total) by mouth every 8 (eight) hours as needed for nausea or vomiting.   vitamin B-12 500 MCG tablet Commonly known as: CYANOCOBALAMIN Take 500 mcg by mouth 2 (two) times daily.         ROS:  A comprehensive review of systems was negative except  for: Gastrointestinal: positive for nausea Musculoskeletal: positive for neuropathy in feet Endocrine: positive for diabetic symptoms including increased fatigue and polydipsia  Blood pressure (!) 173/81, pulse 68, temperature 98.6 F (37 C), temperature source Other (Comment), resp. rate 14, height '5\' 5"'$  (1.651 m), weight 128 lb (58.1 kg), SpO2 98 %. Physical Exam  Results: CLINICAL DATA:  Nausea and weight loss.   EXAM: ABDOMEN ULTRASOUND COMPLETE   COMPARISON:  None.   FINDINGS: Gallbladder: A small volume of sludge and 2 mobile stones measuring up to 2 cm in diameter are seen. There is no gallbladder wall thickening or pericholecystic fluid. Small volume of sludge is also noted in the scratch   Common bile duct: Diameter: 0.6 cm   Liver: No focal lesion. Echogenicity is mildly increased. Portal vein is patent on color Doppler imaging with normal direction of blood flow towards the liver.   IVC: No abnormality visualized.   Pancreas: Visualized portion unremarkable.   Spleen: Size and appearance within normal limits.   Right Kidney: Length: 8.6 cm. Echogenicity within normal limits. No mass or hydronephrosis visualized.   Left Kidney: Length: 8.7 cm. Echogenicity within normal limits. No mass or hydronephrosis visualized.   Abdominal aorta: No aneurysm visualized.   Other findings: None.   IMPRESSION: No acute abnormality.   Two gallstones and a small amount of gallbladder sludge without cholecystitis.   Mild appearing fatty infiltration of the liver.     Electronically Signed   By: Inge Rise M.D.   On: 08/08/2020 11:02   Assessment & Plan:  Stacy Webb is a 75 y.o. female with gallstones on [place] but no symptoms of pain or symptoms that make me suspect that the gallbladder caused the nausea/vomiting and weight loss. Discussed potential causes including gastroparesis given the diabetes, gastritis/ GI bug or some reaction to the immunization that I  cannot explain.    She is feeling better and is eating and gaining weight.   No indication for surgical intervention at this time. Discussed symptoms of gallstones and reasons to return.   All questions were answered to the satisfaction of the patient.   Virl Cagey 11/21/2020, 11:48 AM

## 2020-11-24 ENCOUNTER — Ambulatory Visit
Admission: EM | Admit: 2020-11-24 | Discharge: 2020-11-24 | Discharge status: Absent without leave | Payer: Medicare Other

## 2020-11-24 NOTE — Addendum Note (Signed)
Addended by: Eual Fines on: 11/24/2020 04:04 PM   Modules accepted: Orders, Level of Service, SmartSet

## 2020-12-07 ENCOUNTER — Ambulatory Visit: Payer: Medicare Other | Admitting: General Surgery

## 2020-12-07 NOTE — Progress Notes (Signed)
Labs are stable, and A1c looks great.

## 2020-12-08 ENCOUNTER — Ambulatory Visit (INDEPENDENT_AMBULATORY_CARE_PROVIDER_SITE_OTHER): Payer: Medicare Other | Admitting: Nurse Practitioner

## 2020-12-08 ENCOUNTER — Other Ambulatory Visit: Payer: Self-pay

## 2020-12-08 ENCOUNTER — Encounter: Payer: Self-pay | Admitting: Nurse Practitioner

## 2020-12-08 VITALS — BP 165/74 | HR 84 | Temp 98.0°F | Ht 65.0 in | Wt 123.0 lb

## 2020-12-08 DIAGNOSIS — I10 Essential (primary) hypertension: Secondary | ICD-10-CM

## 2020-12-08 DIAGNOSIS — R110 Nausea: Secondary | ICD-10-CM | POA: Diagnosis not present

## 2020-12-08 DIAGNOSIS — L97501 Non-pressure chronic ulcer of other part of unspecified foot limited to breakdown of skin: Secondary | ICD-10-CM

## 2020-12-08 DIAGNOSIS — Z23 Encounter for immunization: Secondary | ICD-10-CM | POA: Diagnosis not present

## 2020-12-08 DIAGNOSIS — E08621 Diabetes mellitus due to underlying condition with foot ulcer: Secondary | ICD-10-CM

## 2020-12-08 LAB — CMP14+EGFR
ALT: 13 IU/L (ref 0–32)
AST: 24 IU/L (ref 0–40)
Albumin/Globulin Ratio: 1.7 (ref 1.2–2.2)
Albumin: 4.3 g/dL (ref 3.7–4.7)
Alkaline Phosphatase: 67 IU/L (ref 44–121)
BUN/Creatinine Ratio: 21 (ref 12–28)
BUN: 32 mg/dL — ABNORMAL HIGH (ref 8–27)
Bilirubin Total: 0.3 mg/dL (ref 0.0–1.2)
CO2: 24 mmol/L (ref 20–29)
Calcium: 9.8 mg/dL (ref 8.7–10.3)
Chloride: 103 mmol/L (ref 96–106)
Creatinine, Ser: 1.56 mg/dL — ABNORMAL HIGH (ref 0.57–1.00)
Globulin, Total: 2.6 g/dL (ref 1.5–4.5)
Glucose: 113 mg/dL — ABNORMAL HIGH (ref 65–99)
Potassium: 4.1 mmol/L (ref 3.5–5.2)
Sodium: 141 mmol/L (ref 134–144)
Total Protein: 6.9 g/dL (ref 6.0–8.5)
eGFR: 35 mL/min/{1.73_m2} — ABNORMAL LOW (ref >59)

## 2020-12-08 LAB — HEPATITIS C ANTIBODY: Hep C Virus Ab: <0.1 s/co ratio (ref 0.0–0.9)

## 2020-12-08 LAB — CBC WITH DIFFERENTIAL/PLATELET
Basophils Absolute: 0.1 10*3/uL (ref 0.0–0.2)
Basos: 1 %
EOS (ABSOLUTE): 0.2 10*3/uL (ref 0.0–0.4)
Eos: 4 %
Hematocrit: 29.6 % — ABNORMAL LOW (ref 34.0–46.6)
Hemoglobin: 9.7 g/dL — ABNORMAL LOW (ref 11.1–15.9)
Immature Grans (Abs): 0 10*3/uL (ref 0.0–0.1)
Immature Granulocytes: 0 %
Lymphocytes Absolute: 2 10*3/uL (ref 0.7–3.1)
Lymphs: 31 %
MCH: 26.8 pg (ref 26.6–33.0)
MCHC: 32.8 g/dL (ref 31.5–35.7)
MCV: 82 fL (ref 79–97)
Monocytes Absolute: 0.9 10*3/uL (ref 0.1–0.9)
Monocytes: 13 %
Neutrophils Absolute: 3.5 10*3/uL (ref 1.4–7.0)
Neutrophils: 51 %
Platelets: 260 10*3/uL (ref 150–450)
RBC: 3.62 x10E6/uL — ABNORMAL LOW (ref 3.77–5.28)
RDW: 15.5 % — ABNORMAL HIGH (ref 11.7–15.4)
WBC: 6.6 10*3/uL (ref 3.4–10.8)

## 2020-12-08 LAB — LIPID PANEL WITH LDL/HDL RATIO
Cholesterol, Total: 138 mg/dL (ref 100–199)
HDL: 58 mg/dL (ref >39)
LDL Chol Calc (NIH): 67 mg/dL (ref 0–99)
LDL/HDL Ratio: 1.2 ratio (ref 0.0–3.2)
Triglycerides: 63 mg/dL (ref 0–149)
VLDL Cholesterol Cal: 13 mg/dL (ref 5–40)

## 2020-12-08 LAB — MICROALBUMIN / CREATININE URINE RATIO
Creatinine, Urine: 39.5 mg/dL
Microalb/Creat Ratio: <8 mg/g creat (ref 0–29)
Microalbumin, Urine: <3 ug/mL

## 2020-12-08 LAB — HEMOGLOBIN A1C
Est. average glucose Bld gHb Est-mCnc: 131 mg/dL
Hgb A1c MFr Bld: 6.2 % — ABNORMAL HIGH (ref 4.8–5.6)

## 2020-12-08 MED ORDER — GVOKE HYPOPEN 2-PACK 0.5 MG/0.1ML ~~LOC~~ SOAJ: 1.0000 | SUBCUTANEOUS | 1 refills | Status: DC | PRN

## 2020-12-08 MED ORDER — DILTIAZEM HCL ER COATED BEADS 240 MG PO TB24: 240.0000 mg | ORAL_TABLET | Freq: Every day | ORAL | 1 refills | Status: DC

## 2020-12-08 NOTE — Patient Instructions (Signed)
Please have fasting labs drawn 2-3 days prior to your appointment so we can discuss the results during your office visit.  

## 2020-12-08 NOTE — Assessment & Plan Note (Signed)
-  HD flu administered todau

## 2020-12-08 NOTE — Assessment & Plan Note (Signed)
-  has low CBG of 65 this AM -Rx. GVoke -blood sugar has been good without metformin and just using glimepiride, except her BS was 65 this AM after she has been eating low carb diet for several days

## 2020-12-08 NOTE — Assessment & Plan Note (Signed)
BP Readings from Last 3 Encounters:  12/08/20 (!) 165/74  11/21/20 (!) 173/81  11/03/20 124/69   -she states she was taking cardizem 240 mg daily, but was refilled at 360 -she has been cutting her tablets in half, so only getting 180 mg in a cut ER tab -Rx. cardizem 240 mg daily

## 2020-12-08 NOTE — Assessment & Plan Note (Signed)
-  improved since stopping metformin

## 2020-12-08 NOTE — Progress Notes (Signed)
Acute Office Visit  Subjective:    Patient ID: Stacy Webb, female    DOB: 11/21/1945, 75 y.o.   MRN: [id number]  Chief Complaint  Patient presents with   Diabetes    Follow up    Diabetes  Patient is in today for med check. At her last OV, we stopped metformin d/t poor renal function.  She states her blood sugar got as low as 65, but otherwise has been good.  She had been having nausea of uncertain etiology. She met with Dr. Constance Haw, and there was no evidence that gallstones were causing her nausea.  Possibly, gastroparesis related to her DM?  She states her nausea has improved since stopping metformin.  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[place])    Diabetes mellitus without complication (HCC)    Excessive hair growth 07/19/2015   Heart murmur    History of kidney stones    Hyperlipidemia    Hypertension    Neuromuscular disorder ([place])    diabetic neuropathy   Neuropathy    Postmenopausal 07/19/2015    Past Surgical History:  Procedure Laterality Date   ANTERIOR AND POSTERIOR REPAIR N/A 04/28/2018   Procedure: ANTERIOR (CYSTOCELE) AND POSTERIOR REPAIR (RECTOCELE);  Surgeon: Jonnie Kind, MD;  Location: [facility];  Service: Gynecology;  Laterality: N/A;   APPENDECTOMY     CARPAL TUNNEL RELEASE Bilateral 08-Jun-1999   CESAREAN SECTION     COLONOSCOPY N/A 08/28/2012   Procedure: COLONOSCOPY;  Surgeon: Rogene Houston, MD;  [facility];  Service: Endoscopy;  Laterality: N/A;  915   COLONOSCOPY N/A 12/25/2017   Procedure: COLONOSCOPY;  Surgeon: Rogene Houston, MD;  [facility];  Service: Endoscopy;  Laterality: N/A;  12:45   REVERSE SHOULDER ARTHROPLASTY Left 07/10/2020   Procedure: REVERSE SHOULDER ARTHROPLASTY;  Surgeon: [name], MD;  Location: [facility];  Service: Orthopedics;  Laterality: Left;    Family History  Problem Relation Age of Onset   Pneumonia  Son    Suicidality Son    Diabetes Daughter    Colon cancer Paternal Aunt     Social History   Socioeconomic History   Marital status: Widowed    Spouse name: Not on file   Number of children: 3   Years of education: Not on file   Highest education level: Not on file  Occupational History   Occupation: [employer]    Comment: Medical Records  Tobacco Use   Smoking status: Never   Smokeless tobacco: Never  Vaping Use   Vaping Use: Never used  Substance and Sexual Activity   Alcohol use: No   Drug use: No   Sexual activity: Not Currently    Birth control/protection: Post-menopausal  Other Topics Concern   Not on file  Social History Narrative   Husband had ALS and passed away.Twin boys, but one died at 70 months and her other son died in 2014/06/07.      Living daughter- lives with pt.   Social Determinants of Health   Financial Resource Strain: Low Risk    Difficulty of Paying Living Expenses: Not hard at all  Food Insecurity: No Food Insecurity   Worried About Charity fundraiser in the Last Year: Never true   Ran Out of Food in the Last Year: Never true  Transportation Needs: No Transportation Needs  Lack of Transportation (Medical): No   Lack of Transportation (Non-Medical): No  Physical Activity: Insufficiently Active   Days of Exercise per Week: 2 days   Minutes of Exercise per Session: 60 min  Stress: No Stress Concern Present   Feeling of Stress : Not at all  Social Connections: Moderately Isolated   Frequency of Communication with Friends and Family: Three times a week   Frequency of Social Gatherings with Friends and Family: Twice a week   Attends Religious Services: More than 4 times per year   Active Member of [employer] or Organizations: No   Attends Archivist Meetings: Never   Marital Status: Widowed  Human resources officer Violence: Not At Risk   Fear of Current or Ex-Partner: No   Emotionally Abused: No   Physically Abused: No   Sexually Abused: No     Outpatient Medications Prior to Visit  Medication Sig Dispense Refill   ACCU-CHEK AVIVA PLUS test strip      atorvastatin (LIPITOR) 20 MG tablet Take 20 mg by mouth daily.     docusate sodium (COLACE) 100 MG capsule Take 200 mg by mouth at bedtime.     glimepiride (AMARYL) 1 MG tablet Take 1 tablet (1 mg total) by mouth daily with breakfast. 30 tablet 1   hydrochlorothiazide (HYDRODIURIL) 25 MG tablet Take 1 tablet (25 mg total) by mouth daily. 90 tablet 1   losartan (COZAAR) 50 MG tablet Take 1 tablet (50 mg total) by mouth daily. Take 50 mg by mouth daily. 90 tablet 0   vitamin B-12 (CYANOCOBALAMIN) 500 MCG tablet Take 500 mcg by mouth 2 (two) times daily.      diltiazem (CARDIZEM LA) 360 MG 24 hr tablet Take 1 tablet (360 mg total) by mouth daily. (Patient taking differently: Take 360 mg by mouth daily. Take 0.5 tab daily.) 90 tablet 1   ondansetron (ZOFRAN-ODT) 4 MG disintegrating tablet Take 1 tablet (4 mg total) by mouth every 8 (eight) hours as needed for nausea or vomiting. (Patient not taking: Reported on 12/08/2020) 20 tablet 0   No facility-administered medications prior to visit.    Allergies  Allergen Reactions   Clindamycin/Lincomycin Other (See Comments)    Mouth taste like metal   Enalapril Other (See Comments) and Cough    Review of Systems  Constitutional: Negative.   Respiratory: Negative.    Cardiovascular: Negative.   Endocrine: Negative.        Blood sugar was low this AM at 65, she has been eating less carbs for the last 2 days  Psychiatric/Behavioral: Negative.        Objective:    Physical Exam Constitutional:      Appearance: Normal appearance.  Cardiovascular:     Rate and Rhythm: Normal rate and regular rhythm.     Pulses: Normal pulses.     Heart sounds: Normal heart sounds.  Pulmonary:     Effort: Pulmonary effort is normal.     Breath sounds: Normal breath sounds.  Neurological:     Mental Status: She is alert.  Psychiatric:        Mood  and Affect: Mood normal.        Behavior: Behavior normal.        Thought Content: Thought content normal.        Judgment: Judgment normal.    BP (!) 165/74 (BP Location: Left Arm, Patient Position: Sitting, Cuff Size: Normal)   Pulse 84   Temp 98 F (36.7 C) (Oral)  Ht '5\' 5"'  (1.651 m)   Wt 123 lb (55.8 kg)   SpO2 98%   BMI 20.47 kg/m  Wt Readings from Last 3 Encounters:  12/08/20 123 lb (55.8 kg)  11/21/20 128 lb (58.1 kg)  11/03/20 123 lb (55.8 kg)    Health Maintenance Due  Topic Date Due   FOOT EXAM  Never done   OPHTHALMOLOGY EXAM  Never done   Zoster Vaccines- Shingrix (1 of 2) Never done   PNA vac Low Risk Adult (1 of 2 - PCV13) Never done   INFLUENZA VACCINE  Never done    There are no preventive care reminders to display for this patient.   No results found for: TSH Lab Results  Component Value Date   WBC 6.6 12/06/2020   HGB 9.7 (L) 12/06/2020   HCT 29.6 (L) 12/06/2020   MCV 82 12/06/2020   PLT 260 12/06/2020   Lab Results  Component Value Date   NA 141 12/06/2020   K 4.1 12/06/2020   CO2 24 12/06/2020   GLUCOSE 113 (H) 12/06/2020   BUN 32 (H) 12/06/2020   CREATININE 1.56 (H) 12/06/2020   BILITOT 0.3 12/06/2020   ALKPHOS 67 12/06/2020   AST 24 12/06/2020   ALT 13 12/06/2020   PROT 6.9 12/06/2020   ALBUMIN 4.3 12/06/2020   CALCIUM 9.8 12/06/2020   ANIONGAP 11 07/07/2020   EGFR 35 (L) 12/06/2020   Lab Results  Component Value Date   CHOL 138 12/06/2020   Lab Results  Component Value Date   HDL 58 12/06/2020   Lab Results  Component Value Date   LDLCALC 67 12/06/2020   Lab Results  Component Value Date   TRIG 63 12/06/2020   No results found for: CHOLHDL Lab Results  Component Value Date   HGBA1C 6.2 (H) 12/06/2020       Assessment & Plan:   Problem List Items Addressed This Visit       Cardiovascular and Mediastinum   Essential hypertension, benign - Primary    BP Readings from Last 3 Encounters:  12/08/20 (!) 165/74   11/21/20 (!) 173/81  11/03/20 124/69  -she states she was taking cardizem 240 mg daily, but was refilled at 360 -she has been cutting her tablets in half, so only getting 180 mg in a cut ER tab -Rx. 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 GVoke -blood sugar has been good without metformin and just using glimepiride, except her BS was 65 this AM after she has been eating low carb diet for several days      Relevant Medications   Glucagon (GVOKE HYPOPEN 2-PACK) 0.5 MG/0.1ML SOAJ   Other Relevant Orders   Lipid Panel With LDL/HDL Ratio   CMP14+EGFR   Hemoglobin A1c     Other   Nausea    -improved since stopping metformin      Immunization due    -HD flu administered todau        Meds ordered this encounter  Medications   diltiazem (CARDIZEM LA) 240 MG 24 hr tablet    Sig: Take 1 tablet (240 mg total) by mouth daily.  Dispense:  90 tablet    Refill:  1    Can substitute for capsule if cheaper for patient.   Glucagon (GVOKE HYPOPEN 2-PACK) 0.5 MG/0.1ML SOAJ    Sig: Inject 1 each into the skin as needed. Inject if blood sugar is < 55 mg/dL    Dispense:  0.2 mL    Refill:  Sebastian, NP

## 2020-12-19 ENCOUNTER — Other Ambulatory Visit: Payer: Self-pay

## 2020-12-19 ENCOUNTER — Telehealth: Payer: Self-pay

## 2020-12-19 DIAGNOSIS — E11610 Type 2 diabetes mellitus with diabetic neuropathic arthropathy: Secondary | ICD-10-CM

## 2020-12-19 MED ORDER — GLIMEPIRIDE 1 MG PO TABS: 1.0000 mg | ORAL_TABLET | Freq: Every day | ORAL | 1 refills | Status: DC

## 2020-12-19 NOTE — Telephone Encounter (Signed)
Patient needs her diabetic medication sent to [facility] PH# [phone]

## 2020-12-19 NOTE — Telephone Encounter (Signed)
Rx refilled.

## 2020-12-20 ENCOUNTER — Ambulatory Visit: Payer: Medicare Other | Admitting: Nurse Practitioner

## 2020-12-21 NOTE — Telephone Encounter (Signed)
Pt informed

## 2020-12-21 NOTE — Telephone Encounter (Signed)
Please call the pt to insure that the medication was called in, pt states that someone told her a nurse would call

## 2020-12-26 ENCOUNTER — Telehealth: Payer: Self-pay | Admitting: Podiatry

## 2020-12-26 NOTE — Telephone Encounter (Signed)
Left message for pt to call to discuss appt scheduled for 12.9.2022 to pick up diabetic shoes as pt has already received her shoes for 2022.

## 2020-12-26 NOTE — Telephone Encounter (Signed)
Pt returned my call and left message stating it was probably to be remolded for new shoes for next year.  I returned call and left message that we need to get that rescheduled to a Friday with EJ as he is the only one that can mold for custom shoes and that she needed an appt with the doctor here to get an updated rx for the shoes as her last appt was 11.2021.

## 2020-12-27 ENCOUNTER — Encounter: Payer: Self-pay | Admitting: Orthopedic Surgery

## 2020-12-27 ENCOUNTER — Ambulatory Visit (INDEPENDENT_AMBULATORY_CARE_PROVIDER_SITE_OTHER): Payer: Medicare Other | Admitting: Orthopedic Surgery

## 2020-12-27 ENCOUNTER — Other Ambulatory Visit: Payer: Self-pay

## 2020-12-27 ENCOUNTER — Telehealth: Payer: Self-pay | Admitting: Podiatry

## 2020-12-27 ENCOUNTER — Ambulatory Visit: Payer: Medicare Other

## 2020-12-27 VITALS — BP 153/66 | HR 71 | Ht 66.0 in | Wt 128.2 lb

## 2020-12-27 DIAGNOSIS — M25512 Pain in left shoulder: Secondary | ICD-10-CM | POA: Diagnosis not present

## 2020-12-27 DIAGNOSIS — Z96612 Presence of left artificial shoulder joint: Secondary | ICD-10-CM

## 2020-12-27 DIAGNOSIS — G8929 Other chronic pain: Secondary | ICD-10-CM

## 2020-12-27 NOTE — Telephone Encounter (Signed)
Pt returned my call and left message stating to schedule her for her office visit and casting on the same day if at all possible.   I have scheduled pt to see Dr Prudence Davidson @ 915 on 12.9 and EJ @ 945.. I have left message for pt of the times and the date and asked to call if needed.

## 2020-12-27 NOTE — Progress Notes (Signed)
Orthopaedic Postop Note  Assessment: Stacy Webb is a 75 y.o. female s/p Left Reverse Shoulder Arthroplasty  DOS: 07/10/20  Plan: Overall, she is doing well.  Happy with the results of surgery.  She still has issues with strength and lifting above the level of her shoulder, but good ROM otherwise.  Pain is improved compared to before surgery.  Provided reassurance that she will continue to see some gradual improvement.  All questions were answered.  Follow up around 1 year Orley Lawry following surgery.  Follow-up: Return in about 6 months (around 07/10/2021).  XR at next visit: Left shoulder  Subjective:  Chief Complaint  Patient presents with   Shoulder Problem    s/p Left Reverse Shoulder Arthroplasty   DOS: 07/10/20  No pain per se. Does her exercises daily.    History of Present Illness: Stacy Webb is a 75 y.o. female who presents following the above stated procedure.  Surgery was approximately 6 months ago.  She is not having pain in her shoulder.  She still has weakness when lifting over head.  She has difficulty when putting items back in the cabinet.  No issues with her incision.  She is happy with the results and is glad that she had the surgery.     Review of Systems: No fevers or chills No numbness or tingling No Chest Pain No shortness of breath    Objective: BP (!) 153/66   Pulse 71   Ht 5\' 6"  (1.676 m)   Wt 128 lb 3.2 oz (58.2 kg)   BMI 20.69 kg/m   Physical Exam:  Alert and oriented.  No acute distress  Left shoulder surgical incision is healing well.   No surrounding erythema or drainage.   Active forward flexion to 130 degrees.   Active abduction at her side to 90 degrees.   Internal rotation to her lumbar spine.   25 degrees of external rotation at her side.   Sensation is intact in the axillary patch.   Intact sensation to her left hand.   2+ radial pulse   IMAGING: I personally ordered and reviewed the following images:  X-rays left  shoulder obtained in clinic today and compared to previous XR. Unchanged alignment of the reverse shoulder arthroplasty.  No acute injuries are noted.  No evidence of hardware failure or subsidence.  No lucency around the hardware.   Impression: Left reverse shoulder arthroplasty in stable position  [name], MD 12/27/2020 9:26 AM

## 2021-01-05 ENCOUNTER — Ambulatory Visit: Payer: Medicare Other

## 2021-01-05 ENCOUNTER — Ambulatory Visit (INDEPENDENT_AMBULATORY_CARE_PROVIDER_SITE_OTHER): Payer: Medicare Other | Admitting: Orthopedic Surgery

## 2021-01-05 ENCOUNTER — Encounter: Payer: Self-pay | Admitting: Orthopedic Surgery

## 2021-01-05 ENCOUNTER — Other Ambulatory Visit: Payer: Self-pay

## 2021-01-05 VITALS — BP 135/64 | HR 69 | Ht 66.0 in | Wt 129.0 lb

## 2021-01-05 DIAGNOSIS — M25562 Pain in left knee: Secondary | ICD-10-CM

## 2021-01-05 DIAGNOSIS — M1712 Unilateral primary osteoarthritis, left knee: Secondary | ICD-10-CM | POA: Diagnosis not present

## 2021-01-05 NOTE — Progress Notes (Signed)
Orthopaedic Clinic Return  Assessment: Stacy Webb is a 75 y.o. female with the following: Left knee arthritis, most severe within the medial compartment  Plan: Patient's had progressively worsening left knee pain.  No previous injury.  She is taking Tylenol with some improvement in her symptoms.  We discussed the possibility of continue to take Tylenol, perhaps at more frequent intervals.  She can also try adding ibuprofen.  She likes this idea.  If she does not have sufficient relief of her pain, she may consider an injection.  She is not interested in an injection at this time.  Follow-up as needed.  Follow-up: Return if symptoms worsen or fail to improve.   Subjective:  Chief Complaint  Patient presents with   Knee Pain    Lt knee pain for 6 months off and on.     History of Present Illness: Stacy Webb is a 75 y.o. female who returns to clinic for evaluation of her left knee.  Briefly, she is well-known to my practice.  She continues to recover following shoulder surgery.  She has had progressively worsening left knee pain.  Pain is primarily within the medial aspect of the knee.  She takes Tylenol, with some improvement in her symptoms.  At times, the pain is severe enough to wake her up.  She has not had an injection in the left knee.  She is not worked with physical therapy for her left knee.  Review of Systems: No fevers or chills No numbness or tingling No chest pain No shortness of breath No bowel or bladder dysfunction No GI distress No headaches   Objective: BP 135/64   Pulse 69   Ht 5\' 6"  (1.676 m)   Wt 129 lb (58.5 kg)   BMI 20.82 kg/m   Physical Exam:  Alert and oriented, no acute distress  Mild left sided antalgic gait.  No effusion.  Prominent medial femoral condyle.  Tenderness along the medial joint line.  No increased laxity to varus and valgus stress.  Negative Lachman.    IMAGING: I personally ordered and reviewed the following  images:  XR of the left knee with mild varus alignment.  Complete loss of joint space with bone on bone articulation within the medial compartment.  Small osteophytes present in all 3 compartments.  Subchondral sclerosis of the medial, proximal tibia.  Impression: Moderate to severe left knee arthritis; bone on bone in the medial compartment.    [name], MD 01/05/2021 12:53 PM

## 2021-01-08 ENCOUNTER — Other Ambulatory Visit: Payer: Self-pay

## 2021-01-08 ENCOUNTER — Encounter (INDEPENDENT_AMBULATORY_CARE_PROVIDER_SITE_OTHER): Payer: Self-pay | Admitting: Gastroenterology

## 2021-01-08 ENCOUNTER — Ambulatory Visit (INDEPENDENT_AMBULATORY_CARE_PROVIDER_SITE_OTHER): Payer: Medicare Other | Admitting: Gastroenterology

## 2021-01-08 VITALS — BP 130/67 | HR 77 | Temp 98.1°F | Ht 66.0 in | Wt 133.1 lb

## 2021-01-08 DIAGNOSIS — R110 Nausea: Secondary | ICD-10-CM | POA: Diagnosis not present

## 2021-01-08 NOTE — Progress Notes (Signed)
Maylon Peppers, M.D. Gastroenterology & Hepatology [facility] [address] [place], [place] [address] Primary Care Physician: Noreene Larsson, NP [address]  [place] [address] [place] [address]  Referring MD: PCP  Chief Complaint: Nausea  History of Present Illness: Stacy Webb is a 75 y.o. female with past medical history of arthritis, diabetes, hyperlipidemia, hypertension, diabetic neuropathy, who presents for evaluation of nausea.  Patient reports that around May 2022 she had persistent and recurrent episodes of nausea without vomiting. States that it happened a little after she had her left shoulder surgery. She went to see her PCP, who referred her to our office. Did not take any medication. The patient states that she was also feeling very fatigued but denies having any fever, chills, hematochezia, melena, hematemesis, abdominal distention, abdominal pain, diarrhea, jaundice, pruritus. Had some transient weight loss at that time but has recovered it.  She reports that the nausea eventually subsided on its own and she did not require to take any new medications.  Since then she has not presented any more symptoms.  Currently is eating her usual diet.  Last CVE:LFYBO Last Colonoscopy: 2019  Skin tags were found on perianal exam. Multiple small and large-mouthed diverticula were found in the entire colon. External hemorrhoids were found during retroflexion. The hemorrhoids were small.  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[place])    Diabetes mellitus without complication ([place])    Excessive hair growth 07/19/2015   Heart murmur    History of kidney stones     Hyperlipidemia    Hypertension    Neuromuscular disorder ([place])    diabetic neuropathy   Neuropathy    Postmenopausal 07/19/2015    Past Surgical History: Past Surgical History:  Procedure Laterality Date   ANTERIOR AND POSTERIOR REPAIR N/A 04/28/2018   Procedure: ANTERIOR (CYSTOCELE) AND POSTERIOR REPAIR (RECTOCELE);  Surgeon: Jonnie Kind, MD;  Location: [facility];  Service: Gynecology;  Laterality: N/A;   APPENDECTOMY     CARPAL TUNNEL RELEASE Bilateral 2001   CESAREAN SECTION     COLONOSCOPY N/A 08/28/2012   Procedure: COLONOSCOPY;  Surgeon: Rogene Houston, MD;  Location: [facility];  Service: Endoscopy;  Laterality: N/A;  915   COLONOSCOPY N/A 12/25/2017   Procedure: COLONOSCOPY;  Surgeon: Rogene Houston, MD;  Location: [facility];  Service: Endoscopy;  Laterality: N/A;  12:45   REVERSE SHOULDER ARTHROPLASTY Left 07/10/2020   Procedure: REVERSE SHOULDER ARTHROPLASTY;  Surgeon: [name], MD;  Location: [facility];  Service: Orthopedics;  Laterality: Left;    Family History: Family History  Problem Relation Age of Onset   Pneumonia Son    Suicidality Son    Diabetes Daughter    Colon cancer Paternal Aunt     Social History: Social History   Tobacco Use  Smoking Status Never  Smokeless Tobacco Never   Social History   Substance and Sexual Activity  Alcohol Use No   Social History   Substance and Sexual Activity  Drug Use No    Allergies: Allergies  Allergen Reactions   Clindamycin/Lincomycin Other (See Comments)    Mouth taste like metal   Enalapril Other (See Comments)  and Cough    Medications: Current Outpatient Medications  Medication Sig Dispense Refill   ACCU-CHEK AVIVA PLUS test strip      atorvastatin (LIPITOR) 20 MG tablet Take 20 mg by mouth daily.     diltiazem (CARDIZEM LA) 240 MG 24 hr tablet Take 1 tablet (240 mg total) by mouth daily. 90 tablet 1   docusate sodium (COLACE) 100 MG capsule Take 200 mg by mouth at bedtime.      glimepiride (AMARYL) 1 MG tablet Take 1 tablet (1 mg total) by mouth daily with breakfast. 90 tablet 1   Glucagon (GVOKE HYPOPEN 2-PACK) 0.5 MG/0.1ML SOAJ Inject 1 each into the skin as needed. Inject if blood sugar is < 55 mg/dL 0.2 mL 1   hydrochlorothiazide (HYDRODIURIL) 25 MG tablet Take 1 tablet (25 mg total) by mouth daily. 90 tablet 1   losartan (COZAAR) 50 MG tablet Take 1 tablet (50 mg total) by mouth daily. Take 50 mg by mouth daily. 90 tablet 0   vitamin B-12 (CYANOCOBALAMIN) 500 MCG tablet Take 500 mcg by mouth 2 (two) times daily.      No current facility-administered medications for this visit.    Review of Systems: GENERAL: negative for malaise, night sweats HEENT: No changes in hearing or vision, no nose bleeds or other nasal problems. NECK: Negative for lumps, goiter, pain and significant neck swelling RESPIRATORY: Negative for cough, wheezing CARDIOVASCULAR: Negative for chest pain, leg swelling, palpitations, orthopnea GI: SEE HPI MUSCULOSKELETAL: Negative for joint pain or swelling, back pain, and muscle pain. SKIN: Negative for lesions, rash PSYCH: Negative for sleep disturbance, mood disorder and recent psychosocial stressors. HEMATOLOGY Negative for prolonged bleeding, bruising easily, and swollen nodes. ENDOCRINE: Negative for cold or heat intolerance, polyuria, polydipsia and goiter. NEURO: negative for tremor, gait imbalance, syncope and seizures. The remainder of the review of systems is noncontributory.   Physical Exam: BP 130/67 (BP Location: Left Arm, Patient Position: Sitting, Cuff Size: Normal)   Pulse 77   Temp 98.1 F (36.7 C) (Oral)   Ht 5\' 6"  (1.676 m)   Wt 133 lb 1.6 oz (60.4 kg)   BMI 21.48 kg/m  GENERAL: The patient is AO x3, in no acute distress. HEENT: Head is normocephalic and atraumatic. EOMI are intact. Mouth is well hydrated and without lesions. NECK: Supple. No masses LUNGS: Clear to auscultation. No presence of  rhonchi/wheezing/rales. Adequate chest expansion HEART: RRR, normal s1 and s2. ABDOMEN: Soft, nontender, no guarding, no peritoneal signs, and nondistended. BS +. No masses. EXTREMITIES: Without any cyanosis, clubbing, rash, lesions or edema. NEUROLOGIC: AOx3, no focal motor deficit. SKIN: no jaundice, no rashes   Imaging/Labs: as above  I personally reviewed and interpreted the available labs, imaging and endoscopic files.  Impression and Plan: Stacy Webb is a 76 y.o. female with past medical history of arthritis, diabetes, hyperlipidemia, hypertension, diabetic neuropathy, who presents for evaluation of nausea.  The patient presented a transient episode of nausea which has not matured for the last 4 months.  It is likely related to a postprocedural status but given the fact that it has resolved and she has not noted any red flag signs, we will continue to observe and hold off on any further investigations.  The patient understood and agreed.  -Patient to call back to the office if she have worsening abdominal pain or recurrent nausea  All questions were answered.      Maylon Peppers, MD Gastroenterology and Hepatology [facility]

## 2021-01-08 NOTE — Patient Instructions (Signed)
Please call back to the office if you have worsening abdominal pain or recurrent nausea

## 2021-01-11 ENCOUNTER — Telehealth: Payer: Self-pay

## 2021-01-11 NOTE — Telephone Encounter (Signed)
Patient called need med refill  losartan (COZAAR) 50 MG tablet   Pharmacy: [facility]

## 2021-01-12 ENCOUNTER — Other Ambulatory Visit: Payer: Self-pay

## 2021-01-12 MED ORDER — LOSARTAN POTASSIUM 50 MG PO TABS: 50.0000 mg | ORAL_TABLET | Freq: Every day | ORAL | 0 refills | Status: DC

## 2021-01-12 NOTE — Telephone Encounter (Signed)
Refills sent

## 2021-01-22 ENCOUNTER — Other Ambulatory Visit: Payer: Self-pay | Admitting: Nurse Practitioner

## 2021-01-22 DIAGNOSIS — I10 Essential (primary) hypertension: Secondary | ICD-10-CM

## 2021-01-23 ENCOUNTER — Other Ambulatory Visit: Payer: Self-pay | Admitting: *Deleted

## 2021-01-23 DIAGNOSIS — I10 Essential (primary) hypertension: Secondary | ICD-10-CM

## 2021-01-23 MED ORDER — DILTIAZEM HCL ER COATED BEADS 240 MG PO TB24: 240.0000 mg | ORAL_TABLET | Freq: Every day | ORAL | 1 refills | Status: DC

## 2021-03-03 LAB — CMP14+EGFR
ALT: 13 IU/L (ref 0–32)
AST: 23 IU/L (ref 0–40)
Albumin/Globulin Ratio: 1.8 (ref 1.2–2.2)
Albumin: 4.4 g/dL (ref 3.7–4.7)
Alkaline Phosphatase: 83 IU/L (ref 44–121)
BUN/Creatinine Ratio: 22 (ref 12–28)
BUN: 36 mg/dL — ABNORMAL HIGH (ref 8–27)
Bilirubin Total: 0.3 mg/dL (ref 0.0–1.2)
CO2: 22 mmol/L (ref 20–29)
Calcium: 10 mg/dL (ref 8.7–10.3)
Chloride: 105 mmol/L (ref 96–106)
Creatinine, Ser: 1.64 mg/dL — ABNORMAL HIGH (ref 0.57–1.00)
Globulin, Total: 2.4 g/dL (ref 1.5–4.5)
Glucose: 105 mg/dL — ABNORMAL HIGH (ref 70–99)
Potassium: 5 mmol/L (ref 3.5–5.2)
Sodium: 140 mmol/L (ref 134–144)
Total Protein: 6.8 g/dL (ref 6.0–8.5)
eGFR: 33 mL/min/{1.73_m2} — ABNORMAL LOW (ref >59)

## 2021-03-03 LAB — LIPID PANEL WITH LDL/HDL RATIO
Cholesterol, Total: 146 mg/dL (ref 100–199)
HDL: 58 mg/dL (ref >39)
LDL Chol Calc (NIH): 73 mg/dL (ref 0–99)
LDL/HDL Ratio: 1.3 ratio (ref 0.0–3.2)
Triglycerides: 79 mg/dL (ref 0–149)
VLDL Cholesterol Cal: 15 mg/dL (ref 5–40)

## 2021-03-03 LAB — CBC WITH DIFFERENTIAL/PLATELET
Basophils Absolute: 0.1 10*3/uL (ref 0.0–0.2)
Basos: 1 %
EOS (ABSOLUTE): 0.2 10*3/uL (ref 0.0–0.4)
Eos: 3 %
Hematocrit: 33.4 % — ABNORMAL LOW (ref 34.0–46.6)
Hemoglobin: 10.7 g/dL — ABNORMAL LOW (ref 11.1–15.9)
Immature Grans (Abs): 0 10*3/uL (ref 0.0–0.1)
Immature Granulocytes: 0 %
Lymphocytes Absolute: 2.2 10*3/uL (ref 0.7–3.1)
Lymphs: 29 %
MCH: 27.2 pg (ref 26.6–33.0)
MCHC: 32 g/dL (ref 31.5–35.7)
MCV: 85 fL (ref 79–97)
Monocytes Absolute: 1.1 10*3/uL — ABNORMAL HIGH (ref 0.1–0.9)
Monocytes: 14 %
Neutrophils Absolute: 4.1 10*3/uL (ref 1.4–7.0)
Neutrophils: 53 %
Platelets: 294 10*3/uL (ref 150–450)
RBC: 3.94 x10E6/uL (ref 3.77–5.28)
RDW: 14.3 % (ref 11.7–15.4)
WBC: 7.7 10*3/uL (ref 3.4–10.8)

## 2021-03-03 LAB — HEMOGLOBIN A1C
Est. average glucose Bld gHb Est-mCnc: 131 mg/dL
Hgb A1c MFr Bld: 6.2 % — ABNORMAL HIGH (ref 4.8–5.6)

## 2021-03-06 ENCOUNTER — Telehealth: Payer: Self-pay | Admitting: Adult Health

## 2021-03-06 NOTE — Telephone Encounter (Signed)
Patient called wanting to speak to [facility]. She said she had a surgery that Glo Herring had done and she wants to discuss with Anderson Malta whether she may need it again. She said that Anderson Malta would know her as Dot Been.

## 2021-03-06 NOTE — Telephone Encounter (Signed)
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 She will make appt, to see Dr Elonda Husky to see if pessary an option.

## 2021-03-08 ENCOUNTER — Other Ambulatory Visit: Payer: Self-pay | Admitting: Nurse Practitioner

## 2021-03-09 ENCOUNTER — Emergency Department (HOSPITAL_COMMUNITY)
Admission: EM | Admit: 2021-03-09 | Discharge: 2021-03-10 | Disposition: A | Discharge status: Home / Self Care | Payer: Medicare Other | Attending: Emergency Medicine | Admitting: Emergency Medicine

## 2021-03-09 ENCOUNTER — Other Ambulatory Visit: Payer: Self-pay

## 2021-03-09 ENCOUNTER — Ambulatory Visit: Payer: Medicare Other | Admitting: Nurse Practitioner

## 2021-03-09 ENCOUNTER — Encounter (HOSPITAL_COMMUNITY): Payer: Self-pay

## 2021-03-09 DIAGNOSIS — S29001A Unspecified injury of muscle and tendon of front wall of thorax, initial encounter: Secondary | ICD-10-CM | POA: Insufficient documentation

## 2021-03-09 DIAGNOSIS — E1140 Type 2 diabetes mellitus with diabetic neuropathy, unspecified: Secondary | ICD-10-CM | POA: Insufficient documentation

## 2021-03-09 DIAGNOSIS — N183 Chronic kidney disease, stage 3 unspecified: Secondary | ICD-10-CM | POA: Diagnosis not present

## 2021-03-09 DIAGNOSIS — E1122 Type 2 diabetes mellitus with diabetic chronic kidney disease: Secondary | ICD-10-CM | POA: Diagnosis not present

## 2021-03-09 DIAGNOSIS — I129 Hypertensive chronic kidney disease with stage 1 through stage 4 chronic kidney disease, or unspecified chronic kidney disease: Secondary | ICD-10-CM | POA: Insufficient documentation

## 2021-03-09 DIAGNOSIS — Z7984 Long term (current) use of oral hypoglycemic drugs: Secondary | ICD-10-CM | POA: Insufficient documentation

## 2021-03-09 DIAGNOSIS — Z79899 Other long term (current) drug therapy: Secondary | ICD-10-CM | POA: Insufficient documentation

## 2021-03-09 DIAGNOSIS — W19XXXA Unspecified fall, initial encounter: Secondary | ICD-10-CM

## 2021-03-09 DIAGNOSIS — S46911A Strain of unspecified muscle, fascia and tendon at shoulder and upper arm level, right arm, initial encounter: Secondary | ICD-10-CM | POA: Diagnosis not present

## 2021-03-09 DIAGNOSIS — Z96612 Presence of left artificial shoulder joint: Secondary | ICD-10-CM | POA: Diagnosis not present

## 2021-03-09 DIAGNOSIS — S4991XA Unspecified injury of right shoulder and upper arm, initial encounter: Secondary | ICD-10-CM | POA: Diagnosis present

## 2021-03-09 DIAGNOSIS — S298XXA Other specified injuries of thorax, initial encounter: Secondary | ICD-10-CM

## 2021-03-09 DIAGNOSIS — W010XXA Fall on same level from slipping, tripping and stumbling without subsequent striking against object, initial encounter: Secondary | ICD-10-CM | POA: Diagnosis not present

## 2021-03-09 NOTE — ED Triage Notes (Signed)
Pt arrived from home c/o injury to right shoulder, posterior head, right neck and right leg sparms. Pt reports trying to put on a dress at home when it got stuck getting pulled over her head and she fell backwards injuring self. Pt denies LOC, but does report a fall this past April, Pt required left shoulder surgery.

## 2021-03-10 ENCOUNTER — Emergency Department (HOSPITAL_COMMUNITY): Payer: Medicare Other

## 2021-03-10 DIAGNOSIS — S46911A Strain of unspecified muscle, fascia and tendon at shoulder and upper arm level, right arm, initial encounter: Secondary | ICD-10-CM | POA: Diagnosis not present

## 2021-03-10 NOTE — ED Notes (Signed)
Patient transported to X-ray 

## 2021-03-10 NOTE — ED Provider Notes (Signed)
[facility] Provider Note   CSN: [id number] Arrival date & time: 03/09/21  2257     History Chief Complaint  Patient presents with   Stacy Webb is a 75 y.o. female.  The history is provided by the patient.  Fall This is a new problem. The current episode started 3 to 5 hours ago. The problem occurs constantly. Pertinent negatives include no chest pain, no abdominal pain, no headaches and no shortness of breath. Exacerbated by: movement. The symptoms are relieved by rest.  Patient presents after a fall.  She reports she was taking off a dress over her head when she got stuck and fell landing on her right side.  No LOC.  No headache.  No vomiting.  She reports most of the pain is in her right shoulder.  She had some hip pain that is improved.  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[place])    Diabetes mellitus without complication ([place])    Excessive hair growth 07/19/2015   Heart murmur    History of kidney stones    Hyperlipidemia    Hypertension    Neuromuscular disorder ([place])    diabetic neuropathy   Neuropathy    Postmenopausal 07/19/2015    Patient Active Problem List   Diagnosis Date Noted   Immunization due 12/08/2020   Nausea without vomiting 11/03/2020   Diverticulosis 10/29/2020   Cholelithiasis 10/29/2020   Encounter to establish care 09/19/2020   Rotator cuff arthropathy of left shoulder 07/10/2020   Absolute anemia 10/30/2017   Constipation 06/10/2017   Hemorrhoids 06/10/2017   Cystocele with rectocele 06/10/2017   Rectocele 06/10/2017   Diabetic foot ulcer ([place]) 09/27/2016   CKD (chronic kidney disease) stage 3, GFR 30-59 ml/min ([place]) 09/27/2016   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[place]) 09/27/2016   Hyperlipidemia 09/27/2016   Diabetic ulcer of foot associated with diabetes  mellitus due to underlying condition, limited to breakdown of skin ([place]) 09/27/2016   Postmenopausal 07/19/2015   Essential hypertension, benign 08/11/2012   High cholesterol 08/11/2012   Family hx of colon cancer 08/11/2012   Diabetes ([place]) 08/11/2012    Past Surgical History:  Procedure Laterality Date   ANTERIOR AND POSTERIOR REPAIR N/A 04/28/2018   Procedure: ANTERIOR (CYSTOCELE) AND POSTERIOR REPAIR (RECTOCELE);  Surgeon: Jonnie Kind, MD;  Location: [facility];  Service: Gynecology;  Laterality: N/A;   APPENDECTOMY     CARPAL TUNNEL RELEASE Bilateral 2001   CESAREAN SECTION     COLONOSCOPY N/A 08/28/2012   Procedure: COLONOSCOPY;  Surgeon: Rogene Houston, MD;  Location: [facility];  Service: Endoscopy;  Laterality: N/A;  915   COLONOSCOPY N/A 12/25/2017   Procedure: COLONOSCOPY;  Surgeon: Rogene Houston, MD;  Location: [facility];  Service: Endoscopy;  Laterality: N/A;  12:45   REVERSE SHOULDER ARTHROPLASTY Left 07/10/2020   Procedure: REVERSE SHOULDER ARTHROPLASTY;  Surgeon: [name], MD;  Location: [facility];  Service: Orthopedics;  Laterality: Left;     OB History     Gravida  2   Para  2   Term  2   Preterm      AB      Living  1      SAB  IAB      Ectopic      Multiple  1   Live Births  3           Family History  Problem Relation Age of Onset   Pneumonia Son    Suicidality Son    Diabetes Daughter    Colon cancer Paternal Aunt     Social History   Tobacco Use   Smoking status: Never   Smokeless tobacco: Never  Vaping Use   Vaping Use: Never used  Substance Use Topics   Alcohol use: No   Drug use: No    Home Medications Prior to Admission medications   Medication Sig Start Date End Date Taking? Authorizing Provider  ACCU-CHEK AVIVA PLUS test strip  12/05/18   [provider]  atorvastatin (LIPITOR) 20 MG tablet Take 20 mg by mouth daily.    [provider]  diltiazem (CARDIZEM LA) 240 MG 24 hr tablet  Take 1 tablet (240 mg total) by mouth daily. 01/23/21   Noreene Larsson, NP  docusate sodium (COLACE) 100 MG capsule Take 200 mg by mouth at bedtime.    [provider]  glimepiride (AMARYL) 1 MG tablet Take 1 tablet (1 mg total) by mouth daily with breakfast. 12/19/20   Noreene Larsson, NP  Glucagon (GVOKE HYPOPEN 2-PACK) 0.5 MG/0.1ML SOAJ Inject 1 each into the skin as needed. Inject if blood sugar is < 55 mg/dL 12/08/20   Noreene Larsson, NP  hydrochlorothiazide (HYDRODIURIL) 25 MG tablet TAKE 1 TABLET BY MOUTH  DAILY 01/22/21   Fayrene Helper, MD  losartan (COZAAR) 50 MG tablet TAKE 1 TABLET BY MOUTH  DAILY 03/09/21   Noreene Larsson, NP  vitamin B-12 (CYANOCOBALAMIN) 500 MCG tablet Take 500 mcg by mouth 2 (two) times daily.     [provider]  conjugated estrogens (PREMARIN) vaginal cream Place 5.85 Applicatorfuls vaginally 3 (three) times a week. For vaginal thinning and irritation in preop timeframe Patient not taking: No sig reported 03/16/18 03/05/20  Jonnie Kind, MD  ferrous sulfate 325 (65 FE) MG tablet Take 325 mg by mouth daily with breakfast.   03/05/20  [provider]    Allergies    Clindamycin/lincomycin and Enalapril  Review of Systems   Review of Systems  Constitutional:  Negative for fever.  Respiratory:  Negative for shortness of breath.   Cardiovascular:  Negative for chest pain.  Gastrointestinal:  Negative for abdominal pain and vomiting.  Musculoskeletal:  Positive for arthralgias and neck pain.  Neurological:  Negative for headaches.  All other systems reviewed and are negative.  Physical Exam Updated Vital Signs BP (!) 174/79   Pulse 70   Temp 97.9 F (36.6 C) (Oral)   Resp 18   Ht 1.676 m (5\' 6" )   Wt 61 kg   SpO2 100%   BMI 21.71 kg/m   Physical Exam CONSTITUTIONAL: Elderly, no acute distress HEAD: Normocephalic/atraumatic, no signs of trauma EYES: EOMI/PERRL NECK: supple no meningeal signs SPINE/BACK:entire spine  nontender CV: S1/S2 noted, no murmurs/rubs/gallops noted LUNGS: Lungs are clear to auscultation bilaterally, no apparent distress Chest-mild tenderness noted to right chest wall, no crepitus ABDOMEN: soft, nontender, no rebound or guarding, bowel sounds noted throughout abdomen GU:no cva tenderness NEURO: Pt is awake/alert/appropriate, moves all extremitiesx4.  No facial droop.  GCS 15 EXTREMITIES: pulses normal/equal, tenderness to palpation to right anterior shoulder. All other extremities/joints palpated/ranged and nontender No tenderness of range of motion of either  hip SKIN: warm, color normal PSYCH: no abnormalities of mood noted, alert and oriented to situation  ED Results / Procedures / Treatments   Labs (all labs ordered are listed, but only abnormal results are displayed) Labs Reviewed - No data to display  EKG None  Radiology DG Chest 2 View  Result Date: 03/10/2021 CLINICAL DATA:  Recent fall with chest pain, initial encounter EXAM: CHEST - 2 VIEW COMPARISON:  04/24/2016 FINDINGS: Cardiac shadow is within normal limits. Aortic calcifications are seen. Lungs are well aerated bilaterally. No focal infiltrate or sizable effusion is seen. Degenerative changes of the thoracic spine are noted. Prior left shoulder replacement is seen. IMPRESSION: No acute abnormality noted. Electronically Signed   By: Inez Catalina M.D.   On: 03/10/2021 01:42   DG Shoulder Right  Result Date: 03/10/2021 CLINICAL DATA:  Recent fall with shoulder pain, initial encounter EXAM: RIGHT SHOULDER - 2+ VIEW COMPARISON:  11/24/2014 FINDINGS: Degenerative changes of the acromioclavicular joint and glenohumeral joint are noted. Humeral head is high-riding with articulation with acromion consistent with chronic rotator cuff injury. No acute fracture or dislocation is noted. IMPRESSION: Degenerative change as described without acute abnormality. Electronically Signed   By: Inez Catalina M.D.   On: 03/10/2021 01:41     Procedures Procedures   Medications Ordered in ED Medications - No data to display  ED Course  I have reviewed the triage vital signs and the nursing notes.  Pertinent imaging results that were available during my care of the patient were reviewed by me and considered in my medical decision making (see chart for details).    MDM Rules/Calculators/A&P                           Patient presents after mechanical fall.  Most of her pain was located in the right shoulder.  She has limitation in range of motion of the shoulder.  X-ray is negative for acute fracture or dislocation.  Likely has rotator cuff injury.  Will place in sling and referred to orthopedics.  She has no signs of head injury, she is not on anticoagulation. Patient is safe for discharge home.  We discussed appropriate use of sling Final Clinical Impression(s) / ED Diagnoses Final diagnoses:  Fall, initial encounter  Strain of right shoulder, initial encounter  Blunt trauma to chest, initial encounter    Rx / DC Orders ED Discharge Orders     None        Ripley Fraise, MD 03/10/21 0202

## 2021-03-14 ENCOUNTER — Ambulatory Visit (INDEPENDENT_AMBULATORY_CARE_PROVIDER_SITE_OTHER): Payer: Medicare Other | Admitting: Orthopedic Surgery

## 2021-03-14 ENCOUNTER — Other Ambulatory Visit: Payer: Self-pay

## 2021-03-14 ENCOUNTER — Encounter: Payer: Self-pay | Admitting: Orthopedic Surgery

## 2021-03-14 VITALS — BP 139/69 | HR 77 | Ht 66.0 in | Wt 135.0 lb

## 2021-03-14 DIAGNOSIS — M12811 Other specific arthropathies, not elsewhere classified, right shoulder: Secondary | ICD-10-CM | POA: Diagnosis not present

## 2021-03-14 MED ORDER — TRAMADOL HCL 50 MG PO TABS: 50.0000 mg | ORAL_TABLET | Freq: Four times a day (QID) | ORAL | 0 refills | Status: DC | PRN

## 2021-03-14 NOTE — Progress Notes (Signed)
Orthopaedic Clinic Return  Assessment: Stacy Webb is a 75 y.o. female with the following: Right shoulder, rotator cuff arthropathy  Plan: Patient sustained a fall, and has had severe right shoulder pain since.  She has limited range of motion.  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  This was discussed with the patient.  She is familiar with this injury, she required left reverse shoulder arthroplasty due to left shoulder rotator cuff arthropathy.  I offered her injection for the right shoulder, and she elected to proceed.  Follow-up as needed.  Procedure note injection - Right shoulder    Verbal consent was obtained to inject the right shoulder, subacromial space Timeout was completed to confirm the site of injection.   The skin was prepped with alcohol and ethyl chloride was sprayed at the injection site.  A 21-gauge needle was used to inject 40 mg of Depo-Medrol and 1% lidocaine (3 cc) into the subacromial space of the right shoulder using a posterolateral approach.  There were no complications.  A sterile bandage was applied.    Follow-up: Return if symptoms worsen or fail to improve.   Subjective:  Chief Complaint  Patient presents with   Shoulder Pain    RT shoulder 03/09/21 patient fell    History of Present Illness: CHARMIAN FORBIS is a 75 y.o. female who returns to clinic for evaluation of her right shoulder.  She is well-known to my practice, as I have treated her left shoulder, as well as her left knee.  She sustained a fall, few days ago.  She presented to the emergency department with severe right shoulder pain.  X-rays were negative for an acute injury.  Since then, she continues to have severe pain, and limited range of motion.  No prior injury to her right shoulder.  She is taking over-the-counter medications as needed.   Review of Systems: No fevers or chills No numbness  or tingling No chest pain No shortness of breath No bowel or bladder dysfunction No GI distress No headaches   Objective: BP 139/69   Pulse 77   Ht 5\' 6"  (1.676 m)   Wt 135 lb (61.2 kg)   BMI 21.79 kg/m   Physical Exam:  Alert and oriented, no acute distress  Right shoulder without deformity.  No obvious atrophy.  Mild tenderness to palpation over the lateral shoulder.  Pain with a limited range of motion.  Actively 60 degrees forward elevation.  60 degrees abduction at her side.  She does not tolerate additional passive range of motion.  Fingers warm and well-perfused.  2+ radial pulse.  Sensation is intact throughout the right upper extremity.  IMAGING: I personally ordered and reviewed the following images:  X-ray of the right shoulder was obtained in the emergency department demonstrates no acute injury.  There is evidence of chronic rotator cuff injury, with proximal humeral migration.  The humeral head is abutting the undersurface of the acromion.    [name], MD 03/14/2021 11:37 AM

## 2021-03-14 NOTE — Patient Instructions (Signed)

## 2021-03-15 ENCOUNTER — Encounter: Payer: Self-pay | Admitting: Orthopedic Surgery

## 2021-03-16 ENCOUNTER — Ambulatory Visit: Payer: Medicare Other

## 2021-03-16 ENCOUNTER — Encounter: Payer: Self-pay | Admitting: Podiatry

## 2021-03-16 ENCOUNTER — Ambulatory Visit (INDEPENDENT_AMBULATORY_CARE_PROVIDER_SITE_OTHER): Payer: Medicare Other | Admitting: Podiatry

## 2021-03-16 ENCOUNTER — Other Ambulatory Visit: Payer: Self-pay

## 2021-03-16 DIAGNOSIS — M2012 Hallux valgus (acquired), left foot: Secondary | ICD-10-CM

## 2021-03-16 DIAGNOSIS — E1140 Type 2 diabetes mellitus with diabetic neuropathy, unspecified: Secondary | ICD-10-CM | POA: Diagnosis not present

## 2021-03-16 DIAGNOSIS — M2042 Other hammer toe(s) (acquired), left foot: Secondary | ICD-10-CM

## 2021-03-16 DIAGNOSIS — E11610 Type 2 diabetes mellitus with diabetic neuropathic arthropathy: Secondary | ICD-10-CM

## 2021-03-16 DIAGNOSIS — E1149 Type 2 diabetes mellitus with other diabetic neurological complication: Secondary | ICD-10-CM

## 2021-03-16 DIAGNOSIS — M2041 Other hammer toe(s) (acquired), right foot: Secondary | ICD-10-CM

## 2021-03-16 DIAGNOSIS — M21612 Bunion of left foot: Secondary | ICD-10-CM

## 2021-03-16 NOTE — Progress Notes (Signed)
This patient presents to the office for measuring for a new pair of shoes.  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  She presents to the office for evaluation and treatment.  Vascular  Dorsalis pedis and posterior tibial pulses are palpable  B/L.  Capillary return  WNL.  Temperature gradient is  WNL.  Skin turgor  WNL  Sensorium  Senn Weinstein monofilament test diminished left foot.  LOPS absent right foot.. Normal tactile sensation.  Nail Exam  Patient has normal nails with no evidence of bacterial or fungal infection.  Orthopedic  Exam  Muscle tone and muscle strength  WNL.  No limitations of motion feet  B/L.  No crepitus or joint effusion noted.  Foot type is unremarkable and digits show no abnormalities.  HAV  B/L.  Hammer toes  B/L.  Charcot deformity right foot greater than left foot.  Skin  No open lesions.  Normal skin texture and turgor.   Charcot foot  B/L  Diabetes.   ROV.  Discussed condition with this patient.  She is already scheduled with Aaron Edelman today for measurement.  RTC prn   Gardiner Barefoot DPM

## 2021-03-16 NOTE — Progress Notes (Signed)
SITUATION Reason for Consult: Evaluation for Prefabricated Diabetic Shoes and Bilateral Custom Diabetic Inserts. Patient / Caregiver Report: Patient is ready for new custom shoes  OBJECTIVE DATA: Patient History / Diagnosis: Diabetes Mellitus with complications  Presence of Diabetic Complications: - Peripheral Neuropathy - Severe deformity  Current or Previous Devices: Custom Tru-Mold shoes  In-Person Foot Examination:  Skin presentation:   Thin, Shiny, Hairless Nail presentation:   Thick, Ingrown, With Fungus Ulcers & Callousing:   None and no history  Toe / Foot Deformities:   - Pes  Cavus - Hindfoot Varus  - Forefoot ADduction  - Hammertoes - Charcot Deformity   Sensation:    Intact  Shoe Size: Custom  ORTHOTIC RECOMMENDATION Recommended Devices: - 1x pair custom PDAC approved diabetic shoes: Tru-Mold Parris black - 3x pair custom-to-patient vacuum formed diabetic insoles.   GOALS OF SHOES AND INSOLES - Reduce shear and pressure - Reduce / Prevent callus formation - Reduce / Prevent ulceration - Protect the fragile healing compromised diabetic foot.  Patient would benefit from diabetic shoes and inserts as patient has diabetes mellitus and the patient has one or more of the following conditions: - History of partial or complete amputation of the foot - History of previous foot ulceration. - History of pre-ulcerative callus - Peripheral neuropathy with evidence of callus formation - Foot deformity - Poor circulation  ACTIONS PERFORMED Patient was casted for insoles via crush box and measured for shoes via brannock device. Procedure was explained and patient tolerated procedure well. All questions were answered and concerns addressed.  PLAN Insurance to be verified and out of pocket cost communicated to patient. Once cost verified and agreed upon and diabetic certification received, casts are to be sent to [facility] for fabrication. Patient is to be called for  fitting when devices are ready.

## 2021-03-21 ENCOUNTER — Other Ambulatory Visit: Payer: Self-pay

## 2021-03-21 ENCOUNTER — Encounter: Payer: Self-pay | Admitting: Nurse Practitioner

## 2021-03-21 ENCOUNTER — Telehealth: Payer: Self-pay | Admitting: Podiatry

## 2021-03-21 ENCOUNTER — Ambulatory Visit (INDEPENDENT_AMBULATORY_CARE_PROVIDER_SITE_OTHER): Payer: Medicare Other | Admitting: Nurse Practitioner

## 2021-03-21 VITALS — BP 124/70 | HR 78 | Ht 66.0 in | Wt 133.1 lb

## 2021-03-21 DIAGNOSIS — I10 Essential (primary) hypertension: Secondary | ICD-10-CM

## 2021-03-21 DIAGNOSIS — E785 Hyperlipidemia, unspecified: Secondary | ICD-10-CM | POA: Diagnosis not present

## 2021-03-21 DIAGNOSIS — M25511 Pain in right shoulder: Secondary | ICD-10-CM | POA: Diagnosis not present

## 2021-03-21 DIAGNOSIS — N1832 Chronic kidney disease, stage 3b: Secondary | ICD-10-CM

## 2021-03-21 DIAGNOSIS — E11610 Type 2 diabetes mellitus with diabetic neuropathic arthropathy: Secondary | ICD-10-CM

## 2021-03-21 MED ORDER — BLOOD GLUCOSE METER KIT: PACK | 0 refills | Status: AC

## 2021-03-21 MED ORDER — BLOOD GLUCOSE METER KIT
PACK | 0 refills | Status: DC
Start: 2021-03-21 — End: 2021-03-21

## 2021-03-21 MED ORDER — GLIMEPIRIDE 1 MG PO TABS: 1.0000 mg | ORAL_TABLET | Freq: Every day | ORAL | 3 refills | Status: DC

## 2021-03-21 NOTE — Assessment & Plan Note (Signed)
Lab Results  Component Value Date   CHOL 146 03/02/2021   HDL 58 03/02/2021   LDLCALC 73 03/02/2021   TRIG 79 03/02/2021   -goal LDL is <70, but she is very close; no changes to meds

## 2021-03-21 NOTE — Assessment & Plan Note (Signed)
-  fell 03/09/21 -she is followed by ortho, and she states they evaluated her for this -if she needs a future referral, would provide this, but not necessary today

## 2021-03-21 NOTE — Assessment & Plan Note (Addendum)
Lab Results  Component Value Date   HGBA1C 6.2 (H) 03/02/2021   -refilled glimepiride -pharmacy reports poor adherence, but pt state she has been taking it and will need refill -refilled with 90 day fill with 3 refills -had vomiting previously with metformin

## 2021-03-21 NOTE — Patient Instructions (Addendum)
Please have fasting labs drawn 2-3 days prior to your appointment so we can discuss the results during your office visit.  I will be moving to [facility] located at [address], [place], [name] [address] effective Apr 08, 2021. If you would like to establish care with [facility] please call [phone].

## 2021-03-21 NOTE — Assessment & Plan Note (Signed)
Lab Results  Component Value Date   CREATININE 1.64 (H) 03/02/2021   BUN 36 (H) 03/02/2021   NA 140 03/02/2021   K 5.0 03/02/2021   CL 105 03/02/2021   CO2 22 03/02/2021   -labs stable; has mild anemia -would consider nephrology consult if renal function declines further

## 2021-03-21 NOTE — Progress Notes (Signed)
Labs are stable, and A1c looks good at 6.2%.

## 2021-03-21 NOTE — Telephone Encounter (Signed)
Per Ulice Dash @ [facility] no Josem Kaufmann is needed for custom shoes and inserts(A5501/ [id number])  reference # [id number]

## 2021-03-21 NOTE — Assessment & Plan Note (Signed)
BP Readings from Last 3 Encounters:  03/21/21 124/70  03/14/21 139/69  03/10/21 (!) 168/74   -well-controlled; no med changes today

## 2021-03-21 NOTE — Progress Notes (Signed)
Acute Office Visit  Subjective:    Patient ID: Stacy Webb, female    DOB: 01/02/1946, 75 y.o.   MRN: [id number]  Chief Complaint  Patient presents with   Follow-up    3 month follow up recent fall injured R shoulder     HPI Patient is in today for lab follow-up for CKD, HTN, and T2DM.  Pt fell on 03/09/21 and she has right shoulder pain. She sees Dr. Amedeo Kinsman for her left shoulder currently.  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[place])    Diabetes mellitus without complication ([place])    Diabetic foot ulcer ([place]) 09/27/2016   Excessive hair growth 07/19/2015   Heart murmur    History of kidney stones    Hyperlipidemia    Hypertension    Neuromuscular disorder ([place])    diabetic neuropathy   Neuropathy    Postmenopausal 07/19/2015    Past Surgical History:  Procedure Laterality Date   ANTERIOR AND POSTERIOR REPAIR N/A 04/28/2018   Procedure: ANTERIOR (CYSTOCELE) AND POSTERIOR REPAIR (RECTOCELE);  Surgeon: Jonnie Kind, MD;  Location: [facility];  Service: Gynecology;  Laterality: N/A;   APPENDECTOMY     CARPAL TUNNEL RELEASE Bilateral June 25, 1999   CESAREAN SECTION     COLONOSCOPY N/A 08/28/2012   Procedure: COLONOSCOPY;  Surgeon: Rogene Houston, MD;  Location: [facility];  Service: Endoscopy;  Laterality: N/A;  915   COLONOSCOPY N/A 12/25/2017   Procedure: COLONOSCOPY;  Surgeon: Rogene Houston, MD;  Location: [facility];  Service: Endoscopy;  Laterality: N/A;  12:45   REVERSE SHOULDER ARTHROPLASTY Left 07/10/2020   Procedure: REVERSE SHOULDER ARTHROPLASTY;  Surgeon: [name], MD;  Location: [facility];  Service: Orthopedics;  Laterality: Left;    Family History  Problem Relation Age of Onset   Pneumonia Son    Suicidality Son    Diabetes Daughter    Colon cancer Paternal Aunt     Social History   Socioeconomic History   Marital status: Widowed    Spouse name: Not on file    Number of children: 3   Years of education: Not on file   Highest education level: Not on file  Occupational History   Occupation: [employer]    Comment: Medical Records  Tobacco Use   Smoking status: Never   Smokeless tobacco: Never  Vaping Use   Vaping Use: Never used  Substance and Sexual Activity   Alcohol use: No   Drug use: No   Sexual activity: Not Currently    Birth control/protection: Post-menopausal  Other Topics Concern   Not on file  Social History Narrative   Husband had ALS and passed away.Twin boys, but one died at 23 months and her other son died in 2014-06-24.      Living daughter- lives with pt.   Social Determinants of Health   Financial Resource Strain: Low Risk    Difficulty of Paying Living Expenses: Not hard at all  Food Insecurity: No Food Insecurity   Worried About Charity fundraiser in the Last Year: Never true   [place] in the Last Year: Never true  Transportation Needs: No Transportation Needs   Lack of Transportation (Medical): No   Lack of Transportation (Non-Medical): No  Physical Activity: Insufficiently Active   Days of Exercise per Week: 2 days  Minutes of Exercise per Session: 60 min  Stress: No Stress Concern Present   Feeling of Stress : Not at all  Social Connections: Moderately Isolated   Frequency of Communication with Friends and Family: Three times a week   Frequency of Social Gatherings with Friends and Family: Twice a week   Attends Religious Services: More than 4 times per year   Active Member of [employer] or Organizations: No   Attends Archivist Meetings: Never   Marital Status: Widowed  Human resources officer Violence: Not At Risk   Fear of Current or Ex-Partner: No   Emotionally Abused: No   Physically Abused: No   Sexually Abused: No    Outpatient Medications Prior to Visit  Medication Sig Dispense Refill   atorvastatin (LIPITOR) 20 MG tablet Take 20 mg by mouth daily.     diltiazem (CARDIZEM LA) 240 MG 24 hr tablet  Take 1 tablet (240 mg total) by mouth daily. 90 tablet 1   docusate sodium (COLACE) 100 MG capsule Take 200 mg by mouth at bedtime.     Glucagon (GVOKE HYPOPEN 2-PACK) 0.5 MG/0.1ML SOAJ Inject 1 each into the skin as needed. Inject if blood sugar is < 55 mg/dL 0.2 mL 1   hydrochlorothiazide (HYDRODIURIL) 25 MG tablet TAKE 1 TABLET BY MOUTH  DAILY 90 tablet 3   losartan (COZAAR) 50 MG tablet TAKE 1 TABLET BY MOUTH  DAILY 90 tablet 3   traMADol (ULTRAM) 50 MG tablet Take 1 tablet (50 mg total) by mouth every 6 (six) hours as needed. 10 tablet 0   vitamin B-12 (CYANOCOBALAMIN) 500 MCG tablet Take 500 mcg by mouth 2 (two) times daily.      ACCU-CHEK AVIVA PLUS test strip      glimepiride (AMARYL) 1 MG tablet Take 1 tablet (1 mg total) by mouth daily with breakfast. 90 tablet 1   No facility-administered medications prior to visit.    Allergies  Allergen Reactions   Clindamycin/Lincomycin Other (See Comments)    Mouth taste like metal   Enalapril Other (See Comments) and Cough    Review of Systems  Constitutional: Negative.   Respiratory: Negative.    Cardiovascular: Negative.   Musculoskeletal:  Positive for arthralgias.       Right shoulder pain post-fall  Psychiatric/Behavioral: Negative.        Objective:    Physical Exam Constitutional:      Appearance: Normal appearance.  Cardiovascular:     Rate and Rhythm: Normal rate and regular rhythm.     Pulses: Normal pulses.     Heart sounds: Normal heart sounds.  Pulmonary:     Effort: Pulmonary effort is normal.     Breath sounds: Normal breath sounds.  Musculoskeletal:        General: Tenderness present.     Comments: Right shoulder pain with limited ROM  Neurological:     Mental Status: She is alert.  Psychiatric:        Mood and Affect: Mood normal.        Behavior: Behavior normal.        Thought Content: Thought content normal.        Judgment: Judgment normal.    BP 124/70    Pulse 78    Ht '5\' 6"'  (1.676 m)    Wt  133 lb 1.9 oz (60.4 kg)    SpO2 96%    BMI 21.49 kg/m  Wt Readings from Last 3 Encounters:  03/21/21 133 lb 1.9 oz (  60.4 kg)  03/14/21 135 lb (61.2 kg)  03/09/21 134 lb 7.7 oz (61 kg)    Health Maintenance Due  Topic Date Due   Pneumonia Vaccine 20+ Years old (1 - PCV) Never done   OPHTHALMOLOGY EXAM  Never done   Zoster Vaccines- Shingrix (1 of 2) Never done   COVID-19 Vaccine (4 - Booster for Moderna series) 12/13/2020    There are no preventive care reminders to display for this patient.   No results found for: TSH Lab Results  Component Value Date   WBC 7.7 03/02/2021   HGB 10.7 (L) 03/02/2021   HCT 33.4 (L) 03/02/2021   MCV 85 03/02/2021   PLT 294 03/02/2021   Lab Results  Component Value Date   NA 140 03/02/2021   K 5.0 03/02/2021   CO2 22 03/02/2021   GLUCOSE 105 (H) 03/02/2021   BUN 36 (H) 03/02/2021   CREATININE 1.64 (H) 03/02/2021   BILITOT 0.3 03/02/2021   ALKPHOS 83 03/02/2021   AST 23 03/02/2021   ALT 13 03/02/2021   PROT 6.8 03/02/2021   ALBUMIN 4.4 03/02/2021   CALCIUM 10.0 03/02/2021   ANIONGAP 11 07/07/2020   EGFR 33 (L) 03/02/2021   Lab Results  Component Value Date   CHOL 146 03/02/2021   Lab Results  Component Value Date   HDL 58 03/02/2021   Lab Results  Component Value Date   LDLCALC 73 03/02/2021   Lab Results  Component Value Date   TRIG 79 03/02/2021   No results found for: CHOLHDL Lab Results  Component Value Date   HGBA1C 6.2 (H) 03/02/2021       Assessment & Plan:   Problem List Items Addressed This Visit       Cardiovascular and Mediastinum   Essential hypertension, benign    BP Readings from Last 3 Encounters:  03/21/21 124/70  03/14/21 139/69  03/10/21 (!) 168/74  -well-controlled; no med changes today      Relevant Orders   CBC with Differential/Platelet   CMP14+EGFR   Lipid Panel With LDL/HDL Ratio     Endocrine   Diabetes ([place]) - Primary    Lab Results  Component Value Date   HGBA1C 6.2 (H)  03/02/2021  -refilled glimepiride -pharmacy reports poor adherence, but pt state she has been taking it and will need refill -refilled with 90 day fill with 3 refills -had vomiting previously with metformin      Relevant Medications   blood glucose meter kit and supplies   glimepiride (AMARYL) 1 MG tablet   Other Relevant Orders   CBC with Differential/Platelet   CMP14+EGFR   Lipid Panel With LDL/HDL Ratio   Hemoglobin A1c     Genitourinary   CKD (chronic kidney disease) stage 3, GFR 30-59 ml/min (HCC)    Lab Results  Component Value Date   CREATININE 1.64 (H) 03/02/2021   BUN 36 (H) 03/02/2021   NA 140 03/02/2021   K 5.0 03/02/2021   CL 105 03/02/2021   CO2 22 03/02/2021  -labs stable; has mild anemia -would consider nephrology consult if renal function declines further      Relevant Orders   CMP14+EGFR     Other   Hyperlipidemia    Lab Results  Component Value Date   CHOL 146 03/02/2021   HDL 58 03/02/2021   LDLCALC 73 03/02/2021   TRIG 79 03/02/2021  -goal LDL is <70, but she is very close; no changes to meds  Relevant Orders   Lipid Panel With LDL/HDL Ratio   Right shoulder pain    -fell 03/09/21 -she is followed by ortho, and she states they evaluated her for this -if she needs a future referral, would provide this, but not necessary today        Meds ordered this encounter  Medications   blood glucose meter kit and supplies    Sig: Dispense based on patient and insurance preference. Use up to four times daily as directed. (FOR ICD-10 E10.9, E11.9).    Dispense:  1 each    Refill:  0    Pt request Accucheck Guide Me    Order Specific Question:   Number of strips    Answer:   100    Order Specific Question:   Number of lancets    Answer:   100   glimepiride (AMARYL) 1 MG tablet    Sig: Take 1 tablet (1 mg total) by mouth daily with breakfast.    Dispense:  90 tablet    Refill:  3     [place], NP

## 2021-03-22 ENCOUNTER — Telehealth: Payer: Self-pay | Admitting: Podiatry

## 2021-03-22 NOTE — Telephone Encounter (Signed)
Pt left message asking if we had gotten the documents from the primary care office [facility] that she seen him yesterday.  I returned call and told pt once the company gets the documents it takes a few days to upload it and if she would call me back next week to check please as I may not remember to call her.She said she would and thank you for the call back.

## 2021-04-08 HISTORY — PX: BREAST LUMPECTOMY: SHX2

## 2021-04-08 HISTORY — PX: BREAST BIOPSY: SHX20

## 2021-04-11 ENCOUNTER — Telehealth: Payer: Self-pay | Admitting: Podiatry

## 2021-04-11 NOTE — Telephone Encounter (Signed)
Pt called asking if we had gotten the documents from the pcp for her diabetic shoes and upon checking we did get it and I told pt I would call when the shoes come in.

## 2021-04-24 ENCOUNTER — Telehealth: Payer: Self-pay | Admitting: Podiatry

## 2021-04-24 NOTE — Telephone Encounter (Signed)
Diabetic shoes/inserts in..lvm for pt to call to schedule an appt to pick them up. 

## 2021-04-25 ENCOUNTER — Telehealth: Payer: Self-pay | Admitting: Podiatry

## 2021-04-25 NOTE — Telephone Encounter (Signed)
Pt left message this morning saying she could come any day next week except Wednesday morning she has an appt at 850...  I returned call and left message that I have scheduled her for 1.26.2023 @ 930 and to call if this does not work for her.

## 2021-04-26 ENCOUNTER — Ambulatory Visit (INDEPENDENT_AMBULATORY_CARE_PROVIDER_SITE_OTHER): Payer: Medicare Other | Admitting: Family Medicine

## 2021-04-26 ENCOUNTER — Other Ambulatory Visit: Payer: Self-pay

## 2021-04-26 DIAGNOSIS — R052 Subacute cough: Secondary | ICD-10-CM

## 2021-04-26 MED ORDER — PSEUDOEPH-BROMPHEN-DM 30-2-10 MG/5ML PO SYRP: ORAL_SOLUTION | ORAL | 0 refills | Status: DC

## 2021-04-26 MED ORDER — PREDNISONE 5 MG PO TABS: 5.0000 mg | ORAL_TABLET | Freq: Two times a day (BID) | ORAL | 0 refills | Status: AC

## 2021-04-26 MED ORDER — MONTELUKAST SODIUM 10 MG PO TABS: 10.0000 mg | ORAL_TABLET | Freq: Every day | ORAL | 1 refills | Status: DC

## 2021-04-26 NOTE — Progress Notes (Signed)
Virtual Visit via Telephone Note  I connected with Stacy Webb on 04/26/21 at 10:40 AM EST by telephone and verified that I am speaking with the correct person using two identifiers.  Location: Patient: home Provider: office   I discussed the limitations, risks, security and privacy concerns of performing an evaluation and management service by telephone and the availability of in person appointments. I also discussed with the patient that there may be a patient responsible charge related to this service. The patient expressed understanding and agreed to proceed.   History of Present Illness: Nocturnal cough worse at night, can barely catch her breath, no current fever or chills, hacking dry cough, had covid 2 weeksago, no current fever or SOB   Observations/Objective: There were no vitals taken for this visit. Good communication with no confusion and intact memory. Alert and oriented x 3 No signs of respiratory distress during speech   Assessment and Plan:  Cough covid 2 weeks prior with dry hacking cough, symptomatic treatment. Short course of prednisone and cough suppressant Daily singulair short term  Follow Up Instructions:    I discussed the assessment and treatment plan with the patient. The patient was provided an opportunity to ask questions and all were answered. The patient agreed with the plan and demonstrated an understanding of the instructions.   The patient was advised to call back or seek an in-person evaluation if the symptoms worsen or if the condition fails to improve as anticipated.  I provided 11 minutes of non-face-to-face time during this encounter.   Tula Nakayama, MD

## 2021-04-26 NOTE — Patient Instructions (Signed)
F/U AS BEFORE, CALL IF YOU NEED [place] SOONER  You are treated for uncontrolled allergies with cough as well as postinfectious cough  Three medications are prescribed as we discussed  Thanks for choosing [facility], we consider it a privelige to serve you.

## 2021-04-29 ENCOUNTER — Encounter: Payer: Self-pay | Admitting: Family Medicine

## 2021-04-29 DIAGNOSIS — R059 Cough, unspecified: Secondary | ICD-10-CM | POA: Insufficient documentation

## 2021-04-29 NOTE — Assessment & Plan Note (Addendum)
covid 2 weeks prior with dry hacking cough, symptomatic treatment. Short course of prednisone and cough suppressant Daily singulair short term

## 2021-05-02 ENCOUNTER — Encounter: Payer: Self-pay | Admitting: Obstetrics & Gynecology

## 2021-05-02 ENCOUNTER — Ambulatory Visit (INDEPENDENT_AMBULATORY_CARE_PROVIDER_SITE_OTHER): Payer: Medicare Other | Admitting: Obstetrics & Gynecology

## 2021-05-02 ENCOUNTER — Other Ambulatory Visit: Payer: Self-pay

## 2021-05-02 VITALS — BP 118/67 | HR 72 | Ht 66.0 in | Wt 138.2 lb

## 2021-05-02 DIAGNOSIS — K5900 Constipation, unspecified: Secondary | ICD-10-CM

## 2021-05-02 DIAGNOSIS — N3946 Mixed incontinence: Secondary | ICD-10-CM | POA: Diagnosis not present

## 2021-05-02 DIAGNOSIS — R159 Full incontinence of feces: Secondary | ICD-10-CM | POA: Diagnosis not present

## 2021-05-02 NOTE — Progress Notes (Signed)
° °  GYN VISIT Patient name: Stacy Webb MRN [id number]  Date of birth: July 15, 1945 Chief Complaint:   possible prolapse  History of Present Illness:   Stacy Webb is a 76 y.o. G2P2001 PM female being seen today for the following concerns:  -Questionable prolapse: This issue has been going on for several years though she did have improvement s/p A/P repair Jan 2020.  For the past month or so, she notes that when she urinates, she has a small BM ("one raisin-worth.)  Also noting small amount of fecal incontinence- notes stain on her pad.  Struggles with constipation and typically has 2 BMs per week with the use of Miralax. Previously taking stool softener, but seemed to stop working. She does note a small vaginal bulge as though things are coming out.   +stress incontinence.  +Urge incontinence.  Nocturia- 3-4x per night.    Denies vaginal bleeding, discharge, itching or irritation.  Denies pelvic or abdominal pain.  No other acute complaints  -Two falls this past year and has had issues with her shoulders.  In the process of transitioning PCP as [place] has recently moved.  No LMP recorded. Patient is postmenopausal.  Depression screen [facility] 2/9 04/26/2021 03/21/2021 12/08/2020 11/03/2020 10/26/2020  Decreased Interest 0 0 0 1 1  Down, Depressed, Hopeless 0 0 0 1 1  PHQ - 2 Score 0 0 0 2 2  Altered sleeping - - 0 0 0  Tired, decreased energy - - 0 3 3  Change in appetite - - 0 1 1  Feeling bad or failure about yourself  - - 0 0 0  Trouble concentrating - - 1 1 1   Moving slowly or fidgety/restless - - 0 0 0  Suicidal thoughts - - 0 0 0  PHQ-9 Score - - 1 7 7   Difficult doing work/chores - - Not difficult at all Not difficult at all -     Review of Systems:   Pertinent items are noted in HPI Denies fever/chills, dizziness, headaches, visual disturbances, fatigue, shortness of breath, chest pain, abdominal pain, vomiting. Pertinent History Reviewed:  Reviewed past medical,surgical,  social, obstetrical and family history.  Reviewed problem list, medications and allergies. Physical Assessment:   Vitals:   05/02/21 0847  BP: 118/67  Pulse: 72  Weight: 138 lb 3.2 oz (62.7 kg)  Height: 5\' 6"  (1.676 m)  Body mass index is 22.31 kg/m.       Physical Examination:   General appearance: alert, well appearing, and in no distress  Psych: mood appropriate, normal affect  Skin: warm & dry   Cardiovascular: RRR  Respiratory: CTAB, normal respiratory effort, no distress  Abdomen: soft, non-tender, no rebound, no guarding  Pelvic: VULVA: normal appearing vulva with no masses, tenderness or lesions, VAGINA: normal appearing vagina with normal color and discharge, no lesions, minimal uterine prolapse noted, normal uterus and cervix- no lesions or masses appreciated, no abnormal discharge.  Stage 1 anterior prolapse, no rectocele appreciated RECTAL: moderate external hemorrhoids non-thrombosed  Extremities: no edema   Chaperone: Levy Pupa    Assessment & Plan:  1) Mixed urinary continence -reviewed conservative therapy  2) Fecal incontinence -referral created to Dr. Wannetta Sender -for now advised OTC metamucil daily -also plan to start pelvic floor therapy   Janyth Pupa, DO Attending [place], [place] for [facility], [place]

## 2021-05-03 ENCOUNTER — Ambulatory Visit: Payer: Medicare Other

## 2021-05-03 ENCOUNTER — Other Ambulatory Visit: Payer: Self-pay | Admitting: *Deleted

## 2021-05-03 DIAGNOSIS — M2041 Other hammer toe(s) (acquired), right foot: Secondary | ICD-10-CM

## 2021-05-03 DIAGNOSIS — M2011 Hallux valgus (acquired), right foot: Secondary | ICD-10-CM

## 2021-05-03 DIAGNOSIS — M2042 Other hammer toe(s) (acquired), left foot: Secondary | ICD-10-CM

## 2021-05-03 DIAGNOSIS — L97511 Non-pressure chronic ulcer of other part of right foot limited to breakdown of skin: Secondary | ICD-10-CM

## 2021-05-03 DIAGNOSIS — M2012 Hallux valgus (acquired), left foot: Secondary | ICD-10-CM | POA: Diagnosis not present

## 2021-05-03 DIAGNOSIS — L97529 Non-pressure chronic ulcer of other part of left foot with unspecified severity: Secondary | ICD-10-CM

## 2021-05-03 DIAGNOSIS — M14672 Charcot's joint, left ankle and foot: Secondary | ICD-10-CM | POA: Diagnosis not present

## 2021-05-03 DIAGNOSIS — K5900 Constipation, unspecified: Secondary | ICD-10-CM

## 2021-05-03 DIAGNOSIS — N3946 Mixed incontinence: Secondary | ICD-10-CM

## 2021-05-03 DIAGNOSIS — E1140 Type 2 diabetes mellitus with diabetic neuropathy, unspecified: Secondary | ICD-10-CM | POA: Diagnosis not present

## 2021-05-03 DIAGNOSIS — M14671 Charcot's joint, right ankle and foot: Secondary | ICD-10-CM | POA: Diagnosis not present

## 2021-05-03 NOTE — Progress Notes (Signed)
SITUATION Reason for Visit: Fitting of Diabetic Shoes & Insoles Patient / Caregiver Report:  Patient is satisfied with fit and function  OBJECTIVE DATA: Patient History / Diagnosis:     ICD-10-CM   1. Diabetic neuropathy with neurologic complication (HCC)  Q59.56    E11.49     2. Ulcer of toe of left foot, unspecified ulcer stage ([place])  L97.529     3. Ulcer of right foot limited to breakdown of skin ([place])  L97.511     4. Hallux valgus with bunions, right  M20.11    M21.611     5. Hammertoe of left foot  M20.42     6. Hammertoe of right foot  M20.41     7. Hallux valgus with bunions, left  M20.12    M21.612       Change in Status:   None  ACTIONS PERFORMED: In-Person Delivery, patient was fit with: - 1x pair A5501 PDAC approved prefabricated Diabetic Shoes: Custom [employer] Shoes - 3x pair [phone] PDAC approved vacuum-formed custom diabetic insoles  Shoes and insoles were verified for structural integrity and safety. Patient wore shoes and insoles in office. Skin was inspected and free of areas of concern after wearing shoes and inserts. Shoes and inserts fit properly. Patient / Caregiver provided with ferbal instruction and demonstration regarding donning, doffing, wear, care, proper fit, function, purpose, cleaning, and use of shoes and insoles ' and in all related precautions and risks and benefits regarding shoes and insoles. Patient / Caregiver was instructed to wear properly fitting socks with shoes at all times. Patient was also provided with verbal instruction regarding how to report any failures or malfunctions of shoes or inserts, and necessary follow up care. Patient / Caregiver was also instructed to contact physician regarding change in status that may affect function of shoes and inserts.   Patient / Caregiver verbalized undersatnding of instruction provided. Patient / Caregiver demonstrated independence with proper donning and doffing of shoes and  inserts.  PLAN Patient to follow up as needed. Plan of care was discussed with and agreed upon by patient and/or caregiver. All questions were answered and concerns addressed.

## 2021-05-07 ENCOUNTER — Encounter: Payer: Self-pay | Admitting: Physical Therapy

## 2021-05-07 ENCOUNTER — Other Ambulatory Visit: Payer: Self-pay

## 2021-05-07 ENCOUNTER — Ambulatory Visit: Payer: Medicare Other | Attending: Obstetrics & Gynecology | Admitting: Physical Therapy

## 2021-05-07 DIAGNOSIS — R293 Abnormal posture: Secondary | ICD-10-CM | POA: Diagnosis not present

## 2021-05-07 DIAGNOSIS — K5900 Constipation, unspecified: Secondary | ICD-10-CM | POA: Insufficient documentation

## 2021-05-07 DIAGNOSIS — M6281 Muscle weakness (generalized): Secondary | ICD-10-CM | POA: Diagnosis not present

## 2021-05-07 DIAGNOSIS — R279 Unspecified lack of coordination: Secondary | ICD-10-CM | POA: Diagnosis not present

## 2021-05-07 DIAGNOSIS — R159 Full incontinence of feces: Secondary | ICD-10-CM | POA: Diagnosis not present

## 2021-05-07 DIAGNOSIS — R269 Unspecified abnormalities of gait and mobility: Secondary | ICD-10-CM | POA: Insufficient documentation

## 2021-05-07 DIAGNOSIS — N3946 Mixed incontinence: Secondary | ICD-10-CM | POA: Insufficient documentation

## 2021-05-07 NOTE — Therapy (Signed)
[facility] @ [address] [place], [place], [address] Phone: [phone]   Fax:  [phone]  Physical Therapy Evaluation  Patient Details  Name: Stacy Webb MRN: [id number] Date of Birth: 1945-12-23 Referring Provider (PT): Janyth Pupa, DO   Encounter Date: 05/07/2021   PT End of Session - 05/07/21 1615     Visit Number 1    Date for PT Re-Evaluation 08/05/21    Authorization Type Medicare    Progress Note Due on Visit 10    PT Start Time 1530    PT Stop Time 1608    PT Time Calculation (min) 38 min    Activity Tolerance Patient tolerated treatment well    Behavior During Therapy [facility] for tasks assessed/performed             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[place])    Diabetes mellitus without complication ([place])    Diabetic foot ulcer ([place]) 09/27/2016   Excessive hair growth 07/19/2015   Heart murmur    History of kidney stones    Hyperlipidemia    Hypertension    Neuromuscular disorder ([place])    diabetic neuropathy   Neuropathy    Postmenopausal 07/19/2015    Past Surgical History:  Procedure Laterality Date   ANTERIOR AND POSTERIOR REPAIR N/A 04/28/2018   Procedure: ANTERIOR (CYSTOCELE) AND POSTERIOR REPAIR (RECTOCELE);  Surgeon: Jonnie Kind, MD;  Location: [facility];  Service: Gynecology;  Laterality: N/A;   APPENDECTOMY     CARPAL TUNNEL RELEASE Bilateral 2001   CESAREAN SECTION     COLONOSCOPY N/A 08/28/2012   Procedure: COLONOSCOPY;  Surgeon: Rogene Houston, MD;  Location: [facility];  Service: Endoscopy;  Laterality: N/A;  915   COLONOSCOPY N/A 12/25/2017   Procedure: COLONOSCOPY;  Surgeon: Rogene Houston, MD;  Location: [facility];  Service: Endoscopy;  Laterality: N/A;  12:45   REVERSE SHOULDER ARTHROPLASTY Left 07/10/2020   Procedure: REVERSE SHOULDER ARTHROPLASTY;  Surgeon: [name], MD;   Location: [facility] ORS;  Service: Orthopedics;  Laterality: Left;    There were no vitals filed for this visit.    Subjective Assessment - 05/07/21 1533     Subjective Pt reports she has been having a hard time passing stool and only have "about a rasin size stool when I urinate", tried meralax and did not work and now uses metamucil which helps. Now having 2-3 BMs per day, Usually type 2, does feel like she has to strain/push stool out often, sometimes feels like she is unable to completely empty. Pt reports she does have instances of stool leakage with sneezing or coughing and is that small formed piece. Pt denies pain or blood present with urine or BMs. Pt does report she is unable to hold in gas, does leak urine with strong urge and unable to get to the bathroom quickly enough and almost always at night very rarely during the day.    How long can you sit comfortably? no limits    How long can you stand comfortably? no limits    How long can you walk comfortably? no limits    Patient Stated Goals to have less leakage    Currently in Pain? No/denies                [facility] PT Assessment -  05/07/21 0001       Assessment   Medical Diagnosis R15.9,K59.00 (ICD-10-CM) - Fecal incontinence alternating with constipation  N39.46 (ICD-10-CM) - Mixed stress and urge urinary incontinence    Referring Provider (PT) Janyth Pupa, DO    Onset Date/Surgical Date --   over a year but worsened in the last 2-3 months   Prior Therapy Lt shoulder replacement 2022      Precautions   Precautions None      Restrictions   Weight Bearing Restrictions No      Balance Screen   Has the patient fallen in the past 6 months Yes    How many times? 2    Has the patient had a decrease in activity level because of a fear of falling?  Yes   charcot Rt foot with orthopedic shoes   Is the patient reluctant to leave their home because of a fear of falling?  Yes      [place]  residence    Living Arrangements Children   pt's daughter lives with pt   Type of [place]      Prior Function   Level of [place] Retired    Leisure gardening      Cognition   Overall Cognitive Status Within Functional Limits for tasks assessed      Sensation   Light Touch Impaired Detail   numbness in bil feet with bil neuropathy     Coordination   Gross Motor Movements are Fluid and Coordinated Yes    Fine Motor Movements are Fluid and Coordinated Yes      Posture/Postural Control   Posture/Postural Control Postural limitations    Postural Limitations Rounded Shoulders;Posterior pelvic tilt      ROM / Strength   AROM / PROM / Strength AROM;Strength      AROM   Overall AROM Comments thoracic and lumbar spine limited by 50% in extension, 25% in flexion, 75% in side bending and rotation bil. Also limited ankle mobility in DF and PF on Rt ankle with Charcot foot and orthopedic shoe on      Strength   Overall Strength Comments Rt hip grossly 3/5; Lt 3+/5      Flexibility   Soft Tissue Assessment /Muscle Length yes   bil hamstring and adductors limited by 25%     Transfers   Five time sit to stand comments  13s no UE support                        Objective measurements completed on examination: See above findings.     Pelvic Floor Special Questions - 05/07/21 0001     Prior Pelvic/Prostate Exam No    Are you Pregnant or attempting pregnancy? No    Prior Pregnancies Yes    Number of Pregnancies 2    Number of C-Sections 2    Currently Sexually Active No    History of sexually transmitted disease No    Urinary Leakage Yes    How often a few times per week    Pad use pads at night and panty liner during the day, changes 2-3x    Activities that cause leaking With strong urge;Coughing    Urinary urgency Yes   mostly at night and reports due to falls and being unsteady she has to sit at bedside and walk much slower to  bathroom and then has leakage.  Urinary frequency usually 2-3 hours    Fecal incontinence Yes   occasionally now - has improved a lot since starting metamucil (now ~1x per week now)   Fluid intake 32oz water; "tea all the time"    Caffeine beverages tea 3 glasses per day, juice with metamucil    Falling out feeling (prolapse) No   "sometimes I feel pressure when I'm having a BM" but doesn't feel like anything is coming out.   Pelvic Floor Internal Exam pt declined this date                       PT Education - 05/07/21 1615     Education Details Pt educated on exam findings, POC, and voiding and breathing mechanics to decrease straining and improve bowel health    Person(s) Educated Patient    Methods Explanation;Demonstration;Tactile cues;Verbal cues;Handout    Comprehension Verbalized understanding;Returned demonstration              PT Short Term Goals - 05/07/21 1625       PT SHORT TERM GOAL #1   Title Pt to be I with HEP    Time 4    Period Weeks    Status New    Target Date 06/04/21      PT SHORT TERM GOAL #2   Title Pt to report no more than 1 urinary and/or  fecal leak per week to decrease symptoms and improve QOL.    Baseline "a few times per week"    Time 4    Period Weeks    Status New    Target Date 06/04/21      PT SHORT TERM GOAL #3   Title pt to report decreased need to strain for BM at least 50% of the time for decreased strain at pelvic floor    Time 4    Period Weeks    Status New    Target Date 06/04/21      PT SHORT TERM GOAL #4   Title pt to demonstrate at least 3/5 at pelvic floor strength to improve leakage symptoms and pelvic stability.    Time 4    Period Weeks    Status New    Target Date 06/04/21               PT Long Term Goals - 05/07/21 1628       PT LONG TERM GOAL #1   Title Pt to be I with advanced HEP    Time 3    Period Months    Status New    Target Date 08/05/21      PT LONG TERM GOAL #2   Title  Pt to report no more than 1 urinary and/or fecal leak per month to decrease symptoms and improve QOL.    Time 3    Period Months    Status New    Target Date 08/05/21      PT LONG TERM GOAL #3   Title pt to demonstrate at least 4/5 at pelvic floor strength to improve leakage symptoms and pelvic stability.    Time 3    Period Months    Status New    Target Date 08/05/21      PT LONG TERM GOAL #4   Title pt to demonstrate at least 4/5 bil hips for improved pelvic stability and gait mechanics    Time 3    Period Months    Status New  Target Date 08/05/21      PT LONG TERM GOAL #5   Title pt to improve 5XSTS to at least 10s without UE support for decreased fall risk.    Baseline 13s    Time 3    Period Months    Status New    Target Date 08/05/21                    Plan - 05/07/21 1616     Clinical Impression Statement Pt is 76yo female who presents to clinic with 1 year h/o inability to hold in gas and worsening over the past ~3 months with now having urinary and fecal incontinence. Pt reports she started trying Metamucil which has helped her regularity to now ~2-3x BM per day but does feel like she is unable to completely empty sometimes and frequently feels like she needs to strain to push out stool and will have leakage of small amount of formed stool with coughing sometimes and will have a smear sometimes in underwear. Pt has urinary leakage with coughing and strong urge, usually wakes multiple times per night to urinate but is limited with ability to make to the restroom quickly enough with decreased mobility per pt. Pt has had 2 falls recently and is fearful of falling again then sits at EOB when she needs to urinate and then walks very slowly to the bathroom, leaking en route. Pt also has Charcots foot on Rt with orthopedic shoes in place and neuropathy in bil feet per pt. Pt has h/o of A/P repair of prolapse 04/2018 and denies bulge during evaluation but reports  sometimes she does feel pressure with BMs. Pt declined internal assessment this date requesting to wait at this time. Pt found to have decreased mobility at thoracic and lumbar spine, decreased hip mobility and strength in all directions. Pt educated on voiding mechanics and breathing mechanics to improve bowel habits. Pt benefited from extra time review assessment and these mechanics with step stool use. Pt would benefit from additional PT to further address deficits.    Personal Factors and Comorbidities Age;Comorbidity 1;Time since onset of injury/illness/exacerbation    Comorbidities x2 falls recently, x2 c-sections    Examination-Activity Limitations Locomotion Level;Transfers;Continence;Toileting;Stairs;Squat    Examination-Participation Restrictions Cleaning;Community Activity;Yard Work    [employer]chant [employer] officer Evolving/Moderate complexity    Clinical Decision Making Moderate    Rehab Potential Good    PT Frequency 1x / week    PT Duration Other (comment)   10   PT Treatment/Interventions ADLs/Self Care Home Management;Functional mobility training;Therapeutic exercise;Therapeutic activities;Balance training;Neuromuscular re-education;Manual techniques;Patient/family education;Scar mobilization;Passive range of motion;Energy conservation    PT Next Visit Plan internal pelvic is agreeable    PT Home Exercise Plan give HEP             Patient will benefit from skilled therapeutic intervention in order to improve the following deficits and impairments:  Decreased coordination, Decreased endurance, Difficulty walking, Improper body mechanics, Impaired flexibility, Decreased balance, Decreased mobility, Decreased strength, Postural dysfunction, Impaired sensation  Visit Diagnosis: Muscle weakness (generalized) - Plan: PT plan of care cert/re-cert  Lack of coordination - Plan: PT plan of care cert/re-cert  Abnormality of gait and mobility - Plan: PT plan of care  cert/re-cert  Abnormal posture - Plan: PT plan of care cert/re-cert     Problem List Patient Active Problem List   Diagnosis Date Noted   Cough 04/29/2021   Right shoulder pain 03/21/2021   Immunization due 12/08/2020  Diverticulosis 10/29/2020   Cholelithiasis 10/29/2020   Encounter to establish care 09/19/2020   Rotator cuff arthropathy of left shoulder 07/10/2020   Absolute anemia 10/30/2017   Constipation 06/10/2017   Hemorrhoids 06/10/2017   Cystocele with rectocele 06/10/2017   Rectocele 06/10/2017   CKD (chronic kidney disease) stage 3, GFR 30-59 ml/min ([place]) 09/27/2016   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[place]) 09/27/2016   Hyperlipidemia 09/27/2016   Diabetic ulcer of foot associated with diabetes mellitus due to underlying condition, limited to breakdown of skin ([place]) 09/27/2016   Postmenopausal 07/19/2015   Essential hypertension, benign 08/11/2012   High cholesterol 08/11/2012   Family hx of colon cancer 08/11/2012   Diabetes ([place]) 08/11/2012   Stacy Gardner, PT, DPT 01/30/234:30 PM   [facility] @ [address] [place], [place], [address] Phone: [phone]   Fax:  [phone]  Name: Stacy Webb MRN: [id number] Date of Birth: 11/22/1945

## 2021-05-16 ENCOUNTER — Encounter: Payer: Self-pay | Admitting: Physical Therapy

## 2021-05-16 ENCOUNTER — Other Ambulatory Visit: Payer: Self-pay

## 2021-05-16 ENCOUNTER — Ambulatory Visit: Payer: Medicare Other | Attending: Obstetrics & Gynecology | Admitting: Physical Therapy

## 2021-05-16 DIAGNOSIS — R293 Abnormal posture: Secondary | ICD-10-CM | POA: Diagnosis not present

## 2021-05-16 DIAGNOSIS — M6281 Muscle weakness (generalized): Secondary | ICD-10-CM | POA: Insufficient documentation

## 2021-05-16 DIAGNOSIS — R269 Unspecified abnormalities of gait and mobility: Secondary | ICD-10-CM | POA: Diagnosis present

## 2021-05-16 DIAGNOSIS — R278 Other lack of coordination: Secondary | ICD-10-CM | POA: Insufficient documentation

## 2021-05-16 NOTE — Therapy (Signed)
[facility] @ [address] [place], [place], [address] Phone: [phone]   Fax:  [phone]  Physical Therapy Treatment  Patient Details  Name: Stacy Webb MRN: [id number] Date of Birth: February 04, 1946 Referring Provider (PT): Janyth Pupa, DO   Encounter Date: 05/16/2021   PT End of Session - 05/16/21 0926     Visit Number 2    Date for PT Re-Evaluation 08/05/21    Authorization Type Medicare    Progress Note Due on Visit 10    PT Start Time 0845    PT Stop Time 0926    PT Time Calculation (min) 41 min    Activity Tolerance Patient tolerated treatment well    Behavior During Therapy [facility] for tasks assessed/performed             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[place])    Diabetes mellitus without complication ([place])    Diabetic foot ulcer ([place]) 09/27/2016   Excessive hair growth 07/19/2015   Heart murmur    History of kidney stones    Hyperlipidemia    Hypertension    Neuromuscular disorder ([place])    diabetic neuropathy   Neuropathy    Postmenopausal 07/19/2015    Past Surgical History:  Procedure Laterality Date   ANTERIOR AND POSTERIOR REPAIR N/A 04/28/2018   Procedure: ANTERIOR (CYSTOCELE) AND POSTERIOR REPAIR (RECTOCELE);  Surgeon: Jonnie Kind, MD;  Location: [facility];  Service: Gynecology;  Laterality: N/A;   APPENDECTOMY     CARPAL TUNNEL RELEASE Bilateral 2001   CESAREAN SECTION     COLONOSCOPY N/A 08/28/2012   Procedure: COLONOSCOPY;  Surgeon: Rogene Houston, MD;  Location: [facility];  Service: Endoscopy;  Laterality: N/A;  915   COLONOSCOPY N/A 12/25/2017   Procedure: COLONOSCOPY;  Surgeon: Rogene Houston, MD;  Location: [facility];  Service: Endoscopy;  Laterality: N/A;  12:45   REVERSE SHOULDER ARTHROPLASTY Left 07/10/2020   Procedure: REVERSE SHOULDER ARTHROPLASTY;  Surgeon: [name], MD;   Location: [facility];  Service: Orthopedics;  Laterality: Left;    There were no vitals filed for this visit.   Subjective Assessment - 05/16/21 0848     Subjective Pt reports "I am doing better, I am having bowel movements about every day and I have have to strain". Pt has been using a step stool and thinks this has helped a lot. Pt reports she has only noticed her liner "a little moist twice". Pt reports she is urinating about every 1.5 hours during the day, sometimes feels the urge to urinate more after finishing and more will empty. Pt reports she has been taking metamucil per MD recommendation and this has helped alot with fecal leakage, only had x2 small smearing instances since starting this.    How long can you sit comfortably? no limits    How long can you stand comfortably? no limits    How long can you walk comfortably? no limits    Patient Stated Goals to have less leakage    Currently in Pain? No/denies                            Pelvic Floor Special Questions - 05/16/21 0001     Pelvic Floor Internal Exam Pt declined this visit  [place] [facility] PT Treatment/Exercise - 05/16/21 0001       Self-Care   Self-Care Other Self-Care Comments    Other Self-Care Comments  Pt educated on urge drill, HEP, and bladder retraining      Exercises   Exercises Lumbar      Lumbar Exercises: Seated   Other Seated Lumbar Exercises 2x10 pelvic floor contractions; 5H29 quick flicks; 9M4Q isometric holds; 2x5 quick flicks with going over urge drill squencing for carry over for home. Pt also directed in x10 sit<>stands with pelvic contraction in standing and coordinated breathing                     PT Education - 05/16/21 0926     Education Details Pt educated on HEP and urge drill    Person(s) Educated Patient    Methods Explanation;Demonstration;Tactile cues;Verbal cues;Handout    Comprehension Returned demonstration;Verbalized understanding               PT Short Term Goals - 05/07/21 1625       PT SHORT TERM GOAL #1   Title Pt to be I with HEP    Time 4    Period Weeks    Status New    Target Date 06/04/21      PT SHORT TERM GOAL #2   Title Pt to report no more than 1 urinary and/or  fecal leak per week to decrease symptoms and improve QOL.    Baseline "a few times per week"    Time 4    Period Weeks    Status New    Target Date 06/04/21      PT SHORT TERM GOAL #3   Title pt to report decreased need to strain for BM at least 50% of the time for decreased strain at pelvic floor    Time 4    Period Weeks    Status New    Target Date 06/04/21      PT SHORT TERM GOAL #4   Title pt to demonstrate at least 3/5 at pelvic floor strength to improve leakage symptoms and pelvic stability.    Time 4    Period Weeks    Status New    Target Date 06/04/21               PT Long Term Goals - 05/07/21 1628       PT LONG TERM GOAL #1   Title Pt to be I with advanced HEP    Time 3    Period Months    Status New    Target Date 08/05/21      PT LONG TERM GOAL #2   Title Pt to report no more than 1 urinary and/or fecal leak per month to decrease symptoms and improve QOL.    Time 3    Period Months    Status New    Target Date 08/05/21      PT LONG TERM GOAL #3   Title pt to demonstrate at least 4/5 at pelvic floor strength to improve leakage symptoms and pelvic stability.    Time 3    Period Months    Status New    Target Date 08/05/21      PT LONG TERM GOAL #4   Title pt to demonstrate at least 4/5 bil hips for improved pelvic stability and gait mechanics    Time 3    Period Months    Status New  Target Date 08/05/21      PT LONG TERM GOAL #5   Title pt to improve 5XSTS to at least 10s without UE support for decreased fall risk.    Baseline 13s    Time 3    Period Months    Status New    Target Date 08/05/21                   Plan - 05/16/21 4403     Clinical Impression  Statement Pt presents to clinic for furst followup appointment and reports she is very pleased with progress since eval. Pt reports she is now having more regular bowel movement "about every day", no longer needing to strain at all, does use step stool and wants to purchase a squatty potty from [employer] for better fitting in bathroom than what she is using, and feels she is fully emptying after one BM instead of multiple per day. Pt has had 2 very small smearing since eval but reports this is less in amount and frequency than before PT. Pt continues to use Metamucil as per MD instruction and thinks this has helped too. Pt reports she thinks she urinates about every 1.5 hours and has only had "damp" liner 2x since eval as well and thinks leaked while trying to get to the bathroom quickly enough with a strong urge. Pt reports she would like to try additional techniques at home between appointmens before doing internal assessment if possible, pt given HEP for pelvic floor strengthening and urge drill. PT educated pt on reasoning behind internal assessment and she reported she understood but would like to this first. Pt directed in HEP exercises in clinic as treatment this date for carry over with good effect, handout given. Pt also educated on urge drill with handout and completed x2 during session for improved carry over. Pt would benefit from additional PT to further address deficits.    Personal Factors and Comorbidities Age;Comorbidity 1;Time since onset of injury/illness/exacerbation    Comorbidities x2 falls recently, x2 c-sections    Examination-Activity Limitations Locomotion Level;Transfers;Continence;Toileting;Stairs;Squat    Examination-Participation Restrictions Cleaning;Community Activity;Yard Work    [employer]chant [employer] officer Evolving/Moderate complexity    Rehab Potential Good    PT Frequency 1x / week    PT Duration Other (comment)    PT Treatment/Interventions ADLs/Self Care Home  Management;Functional mobility training;Therapeutic exercise;Therapeutic activities;Balance training;Neuromuscular re-education;Manual techniques;Patient/family education;Scar mobilization;Passive range of motion;Energy conservation    PT Next Visit Plan gait and balance retraining with coordination of pelvic floor    PT Home Exercise Plan [place] and Agree with Plan of Care Patient             Patient will benefit from skilled therapeutic intervention in order to improve the following deficits and impairments:  Decreased coordination, Decreased endurance, Difficulty walking, Improper body mechanics, Impaired flexibility, Decreased balance, Decreased mobility, Decreased strength, Postural dysfunction, Impaired sensation  Visit Diagnosis: Abnormal posture  Abnormality of gait and mobility  Other lack of coordination     Problem List Patient Active Problem List   Diagnosis Date Noted   Cough 04/29/2021   Right shoulder pain 03/21/2021   Immunization due 12/08/2020   Diverticulosis 10/29/2020   Cholelithiasis 10/29/2020   Encounter to establish care 09/19/2020   Rotator cuff arthropathy of left shoulder 07/10/2020   Absolute anemia 10/30/2017   Constipation 06/10/2017   Hemorrhoids 06/10/2017   Cystocele with rectocele 06/10/2017   Rectocele 06/10/2017   CKD (chronic  kidney disease) stage 3, GFR 30-59 ml/min ([facility]) 09/27/2016   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[place]) 09/27/2016   Hyperlipidemia 09/27/2016   Diabetic ulcer of foot associated with diabetes mellitus due to underlying condition, limited to breakdown of skin ([place]) 09/27/2016   Postmenopausal 07/19/2015   Essential hypertension, benign 08/11/2012   High cholesterol 08/11/2012   Family hx of colon cancer 08/11/2012   Diabetes ([place]) 08/11/2012   Stacy Gardner, PT, DPT 05/16/2308:12 AM   [facility] @ [address] [place], [place],  [address] Phone: [phone]   Fax:  [phone]  Name: Stacy Webb MRN: [id number] Date of Birth: 1945-11-10

## 2021-05-16 NOTE — Patient Instructions (Addendum)
Urge Incontinence  Ideal urination frequency is every 2-4 wakeful hours, which equates to 5-8 times within a 24-hour period.   Urge incontinence is leakage that occurs when the bladder muscle contracts, creating a sudden need to go before getting to the bathroom.   Going too often when your bladder isn't actually full can disrupt the body's automatic signals to store and hold urine longer, which will increase urgency/frequency.  In this case, the bladder is running the show and strategies can be learned to retrain this pattern.   One should be able to control the first urge to urinate, at around 179mL.  The bladder can hold up to a grande latte, or 49mL. To help you gain control, practice the Urge Drill below when urgency strikes.  This drill will help retrain your bladder signals and allow you to store and hold urine longer.  The overall goal is to stretch out your time between voids to reach a more manageable voiding schedule.    Practice your "quick flicks" often throughout the day (each waking hour) even when you don't need feel the urge to go.  This will help strengthen your pelvic floor muscles, making them more effective in controlling leakage.  Urge Drill  When you feel an urge to go, follow these steps to regain control: Stop what you are doing and be still Take one deep breath, directing your air into your abdomen Think an affirming thought, such as I've got this. Do 5 quick flicks of your pelvic floor Walk with control to the bathroom to void, or delay voiding    Relaxation Exercises with the Urge to Void   When you experience an urge to void:  FIRST  Stop and stand very still    Sit down if you can    Dont move    You need to stay very still to maintain control  SECOND Squeeze your pelvic floor muscles 5 times, like a quick flick, to keep from leaking  THIRD Relax  Take a deep breath and then let it out  Try to make the urge go away by using relaxation and  visualization techniques  FINALLY When you feel the urge go away somewhat, walk normally to the bathroom.   If the urge gets suddenly stronger on the way, you may stop again and relax to regain control.    Access Code: [email or web address] URL: [email or web address] Date: 05/16/2021 Prepared by: [facility]  Exercises Seated Pelvic Floor Contraction - 1 x daily - 7 x weekly - 1 sets - 10 reps Seated Quick Flick Pelvic Floor Contractions - 1 x daily - 7 x weekly - 1 sets - 10 reps Seated Pelvic Floor Muscles Isometrics on Towel Roll - 1 x daily - 7 x weekly - 1 sets - 5 reps - 8s holds Sit to Stand with Pelvic Floor Contraction - 1 x daily - 7 x weekly - 1 sets - 10 reps

## 2021-05-17 ENCOUNTER — Ambulatory Visit: Payer: Medicare Other | Admitting: Podiatry

## 2021-05-23 ENCOUNTER — Other Ambulatory Visit: Payer: Self-pay

## 2021-05-23 ENCOUNTER — Ambulatory Visit: Payer: Medicare Other | Admitting: Physical Therapy

## 2021-05-23 ENCOUNTER — Encounter: Payer: Self-pay | Admitting: Physical Therapy

## 2021-05-23 DIAGNOSIS — R293 Abnormal posture: Secondary | ICD-10-CM | POA: Diagnosis not present

## 2021-05-23 DIAGNOSIS — R278 Other lack of coordination: Secondary | ICD-10-CM

## 2021-05-23 DIAGNOSIS — R269 Unspecified abnormalities of gait and mobility: Secondary | ICD-10-CM

## 2021-05-23 DIAGNOSIS — M6281 Muscle weakness (generalized): Secondary | ICD-10-CM

## 2021-05-23 NOTE — Therapy (Addendum)
[place] [facility] @ [address] [place] [place], [place], [address] Phone: [phone]   Fax:  [phone]  Physical Therapy Treatment  Patient Details  Name: Stacy Webb MRN: [id number] Date of Birth: [birth year] Referring Provider (PT): Janyth Pupa, DO   Encounter Date: 05/23/2021   PT End of Session - 05/23/21 1007     Visit Number 3    Date for PT Re-Evaluation 08/05/21    Authorization Type Medicare    Progress Note Due on Visit 10    PT Start Time 0845    PT Stop Time 0928    PT Time Calculation (min) 43 min    Activity Tolerance Patient tolerated treatment well    Behavior During Therapy [facility] for tasks assessed/performed             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[place])    Diabetes mellitus without complication ([place])    Diabetic foot ulcer ([place]) 09/27/2016   Excessive hair growth 07/19/2015   Heart murmur    History of kidney stones    Hyperlipidemia    Hypertension    Neuromuscular disorder ([place])    diabetic neuropathy   Neuropathy    Postmenopausal 07/19/2015    Past Surgical History:  Procedure Laterality Date   ANTERIOR AND POSTERIOR REPAIR N/A 04/28/2018   Procedure: ANTERIOR (CYSTOCELE) AND POSTERIOR REPAIR (RECTOCELE);  Surgeon: Jonnie Kind, MD;  Location: [facility];  Service: Gynecology;  Laterality: N/A;   APPENDECTOMY     CARPAL TUNNEL RELEASE Bilateral 2001   CESAREAN SECTION     COLONOSCOPY N/A 08/28/2012   Procedure: COLONOSCOPY;  Surgeon: Rogene Houston, MD;  Location: [facility];  Service: Endoscopy;  Laterality: N/A;  915   COLONOSCOPY N/A 12/25/2017   Procedure: COLONOSCOPY;  Surgeon: Rogene Houston, MD;  Location: [facility];  Service: Endoscopy;  Laterality: N/A;  12:45   REVERSE SHOULDER ARTHROPLASTY Left 07/10/2020   Procedure: REVERSE SHOULDER ARTHROPLASTY;  Surgeon: [name], MD;   Location: [facility];  Service: Orthopedics;  Laterality: Left;    There were no vitals filed for this visit.   Subjective Assessment - 05/23/21 0848     Subjective Pt reports she has noticed she seeing improvement with urgency, now going every 2-2.5 hours without leakage. Now getting 2x per night to urinate. Pt reports as long as she takes metamucil she has BM at least once per day but sometimes a second however has not leakage. Pt got squatty potty and thinks this has helped a lot and since this has usually been having one full BM in the morning, no straining.    Patient Stated Goals to have less leakage                            Pelvic Floor Special Questions - 05/23/21 0001     Pelvic Floor Internal Exam pt declined               [facility] Adult PT Treatment/Exercise - 05/23/21 0001       Lumbar Exercises: Aerobic   Nustep 5 mins L3 with PT present to discuss progress      Lumbar Exercises: Standing   Other Standing Lumbar Exercises palloffs red band x10 each    Other Standing Lumbar  Exercises step on/off unilateral on bosu ball x10 each; lateral step on/off unilateral on bosu ball x10      Lumbar Exercises: Seated   Sit to Stand 20 reps    Sit to Stand Limitations 5# and no UE support and coordinating pelvic floor    Other Seated Lumbar Exercises TA activations withvertical foam roller push down and coordinated breathing x10                     PT Education - 05/23/21 0957     Education Details Pt educated on proper technique for all exercises with coordination of pelvic floor and breathing    Person(s) Educated Patient    Methods Explanation;Demonstration;Tactile cues;Verbal cues    Comprehension Verbalized understanding;Returned demonstration              PT Short Term Goals - 05/07/21 1625       PT SHORT TERM GOAL #1   Title Pt to be I with HEP    Time 4    Period Weeks    Status New    Target Date 06/04/21      PT SHORT TERM  GOAL #2   Title Pt to report no more than 1 urinary and/or  fecal leak per week to decrease symptoms and improve QOL.    Baseline "a few times per week"    Time 4    Period Weeks    Status New    Target Date 06/04/21      PT SHORT TERM GOAL #3   Title pt to report decreased need to strain for BM at least 50% of the time for decreased strain at pelvic floor    Time 4    Period Weeks    Status New    Target Date 06/04/21      PT SHORT TERM GOAL #4   Title pt to demonstrate at least 3/5 at pelvic floor strength to improve leakage symptoms and pelvic stability.    Time 4    Period Weeks    Status New    Target Date 06/04/21               PT Long Term Goals - 05/07/21 1628       PT LONG TERM GOAL #1   Title Pt to be I with advanced HEP    Time 3    Period Months    Status New    Target Date 08/05/21      PT LONG TERM GOAL #2   Title Pt to report no more than 1 urinary and/or fecal leak per month to decrease symptoms and improve QOL.    Time 3    Period Months    Status New    Target Date 08/05/21      PT LONG TERM GOAL #3   Title pt to demonstrate at least 4/5 at pelvic floor strength to improve leakage symptoms and pelvic stability.    Time 3    Period Months    Status New    Target Date 08/05/21      PT LONG TERM GOAL #4   Title pt to demonstrate at least 4/5 bil hips for improved pelvic stability and gait mechanics    Time 3    Period Months    Status New    Target Date 08/05/21      PT LONG TERM GOAL #5   Title pt to improve 5XSTS to at least 10s  without UE support for decreased fall risk.    Baseline 13s    Time 3    Period Months    Status New    Target Date 08/05/21                   Plan - 05/23/21 1009     Clinical Impression Statement Pt presents to clinic very pleased with progress and no longer having leakage, more regular with bowel movements, no urinary leakage and increasing time betwen voids reporting after thinking about it  she was urinating more often than every 2 hours and now at 2-2.5 without leakage. Pt also reports step stool in bathroom has really helped no longer needing to strain and BMs are occuring more quickly and fully without need of an additional BM later. Pt session focused on pelvic floor and breathing coordination with hip/core strengthening. Pt tolerated well with brief restbreaks. Pt reports she felt she was able to coordinate pelvic floor with all activities today and had better understanding of how to do this for home. Pt would benefit from additional PT to further address deficits.    Personal Factors and Comorbidities Age;Comorbidity 1;Time since onset of injury/illness/exacerbation    Comorbidities x2 falls recently, x2 c-sections    Examination-Activity Limitations Locomotion Level;Transfers;Continence;Toileting;Stairs;Squat    Examination-Participation Restrictions Cleaning;Community Activity;Yard Work    [employer]chant [employer] officer Evolving/Moderate complexity    Rehab Potential Good    PT Frequency 1x / week    PT Duration Other (comment)    PT Treatment/Interventions ADLs/Self Care Home Management;Functional mobility training;Therapeutic exercise;Therapeutic activities;Balance training;Neuromuscular re-education;Manual techniques;Patient/family education;Scar mobilization;Passive range of motion;Energy conservation    PT Next Visit Plan gait and balance retraining with coordination of pelvic floor    PT Home Exercise Plan [place] and Agree with Plan of Care Patient             Patient will benefit from skilled therapeutic intervention in order to improve the following deficits and impairments:  Decreased coordination, Decreased endurance, Difficulty walking, Improper body mechanics, Impaired flexibility, Decreased balance, Decreased mobility, Decreased strength, Postural dysfunction, Impaired sensation  Visit Diagnosis: Muscle weakness (generalized)  Abnormality  of gait and mobility  Other lack of coordination     Problem List Patient Active Problem List   Diagnosis Date Noted   Cough 04/29/2021   Right shoulder pain 03/21/2021   Immunization due 12/08/2020   Diverticulosis 10/29/2020   Cholelithiasis 10/29/2020   Encounter to establish care 09/19/2020   Rotator cuff arthropathy of left shoulder 07/10/2020   Absolute anemia 10/30/2017   Constipation 06/10/2017   Hemorrhoids 06/10/2017   Cystocele with rectocele 06/10/2017   Rectocele 06/10/2017   CKD (chronic kidney disease) stage 3, GFR 30-59 ml/min (HCC) 09/27/2016   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[place]) 09/27/2016   Hyperlipidemia 09/27/2016   Diabetic ulcer of foot associated with diabetes mellitus due to underlying condition, limited to breakdown of skin ([place]) 09/27/2016   Postmenopausal 07/19/2015   Essential hypertension, benign 08/11/2012   High cholesterol 08/11/2012   Family hx of colon cancer 08/11/2012   Diabetes ([place]) 08/11/2012   Stacy Gardner, PT, DPT 05/23/2308:18 AM    PHYSICAL THERAPY DISCHARGE SUMMARY  Visits from Start of Care: 3  Current functional level related to goals / functional outcomes: Pt called and reports she is doing better and would like to be Dc'd from PT    Remaining deficits: Unable to formally reassess as pt called and requested to  be DC'd   Education / Equipment: HEP   Patient agrees to discharge. Patient goals were met. Patient is being discharged due to being pleased with the current functional level.   [facility] @ [address] [place], [place], [address] Phone: [phone]   Fax:  [phone]  Name: Stacy Webb MRN: [id number] Date of Birth: 1946-03-07

## 2021-05-28 ENCOUNTER — Ambulatory Visit (INDEPENDENT_AMBULATORY_CARE_PROVIDER_SITE_OTHER): Payer: Medicare Other | Admitting: Family Medicine

## 2021-05-28 ENCOUNTER — Other Ambulatory Visit: Payer: Self-pay

## 2021-05-28 DIAGNOSIS — I10 Essential (primary) hypertension: Secondary | ICD-10-CM | POA: Diagnosis not present

## 2021-05-28 DIAGNOSIS — E785 Hyperlipidemia, unspecified: Secondary | ICD-10-CM | POA: Diagnosis not present

## 2021-05-28 DIAGNOSIS — E11610 Type 2 diabetes mellitus with diabetic neuropathic arthropathy: Secondary | ICD-10-CM | POA: Diagnosis not present

## 2021-05-28 NOTE — Patient Instructions (Addendum)
You're doing well.  Follow up in 3-6 months.  Take care  Dr. Lacinda Axon

## 2021-05-28 NOTE — Assessment & Plan Note (Signed)
Stable. Continue Lipitor. 

## 2021-05-28 NOTE — Assessment & Plan Note (Signed)
Stable.  Continue diltiazem, losartan, HCTZ.

## 2021-05-28 NOTE — Progress Notes (Signed)
Subjective:  Patient ID: Stacy Webb, female    DOB: Aug 28, 1945  Age: 76 y.o. MRN: [id number]  CC: Chief Complaint  Patient presents with   Diabetes   Establish Care    HPI:  76 year old female with hypertension, type 2 diabetes with complications (Charcot foot), CKD, hyperlipidemia presents to establish care with me.  Patient is feeling well and has no specific complaints at this time.  Diabetes has been under good control for quite some time.  Last A1c was 6.2 in November.  She is currently on Amaryl 1 mg daily.  She is on no other medications regarding this.  She watches her diet.  No current issues regarding Charcot foot.  No open wounds.  Hypertension is stable.  She is on losartan, HCTZ.  She is also on Cardizem.    Lipids have been well controlled.  Most recent LDL was 73.  She is tolerating Lipitor without difficulty.  Patient Active Problem List   Diagnosis Date Noted   Cholelithiasis 10/29/2020   Rotator cuff arthropathy of left shoulder 07/10/2020   Cystocele with rectocele 06/10/2017   CKD (chronic kidney disease) stage 3, GFR 30-59 ml/min ([place]) 09/27/2016   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[place]) 09/27/2016   Hyperlipidemia 09/27/2016   Essential hypertension, benign 08/11/2012   Diabetes ([place]) 08/11/2012    Social Hx   Social History   Socioeconomic History   Marital status: Widowed    Spouse name: Not on file   Number of children: 3   Years of education: Not on file   Highest education level: Not on file  Occupational History   Occupation: [employer]    Comment: Medical Records  Tobacco Use   Smoking status: Never   Smokeless tobacco: Never  Vaping Use   Vaping Use: Never used  Substance and Sexual Activity   Alcohol use: No   Drug use: No   Sexual activity: Not Currently    Birth control/protection: Post-menopausal  Other Topics Concern   Not on file  Social History Narrative   Husband had ALS and passed away.Twin boys, but one died at 73  months and her other son died in July 21, 2014.      Living daughter- lives with pt.   Social Determinants of Health   Financial Resource Strain: Low Risk    Difficulty of Paying Living Expenses: Not hard at all  Food Insecurity: No Food Insecurity   Worried About Charity fundraiser in the Last Year: Never true   [place] in the Last Year: Never true  Transportation Needs: No Transportation Needs   Lack of Transportation (Medical): No   Lack of Transportation (Non-Medical): No  Physical Activity: Insufficiently Active   Days of Exercise per Week: 2 days   Minutes of Exercise per Session: 60 min  Stress: No Stress Concern Present   Feeling of Stress : Not at all  Social Connections: Moderately Isolated   Frequency of Communication with Friends and Family: Three times a week   Frequency of Social Gatherings with Friends and Family: Twice a week   Attends Religious Services: More than 4 times per year   Active Member of [employer] or Organizations: No   Attends Archivist Meetings: Never   Marital Status: Widowed    Review of Systems  Constitutional: Negative.   Skin: Negative.   Per HPI  Objective:  BP 140/70    Pulse 69    Temp 97.8 F (36.6 C)  Ht 5\' 6"  (1.676 m)    Wt 141 lb (64 kg)    SpO2 99%    BMI 22.76 kg/m   BP/Weight 05/28/2021 05/02/2021 38/93/7342  Systolic BP 876 811 572  Diastolic BP 70 67 70  Wt. (Lbs) 141 138.2 133.12  BMI 22.76 22.31 21.49    Physical Exam Constitutional:      General: She is not in acute distress.    Appearance: Normal appearance. She is not ill-appearing.  HENT:     Head: Normocephalic and atraumatic.  Eyes:     General:        Right eye: No discharge.        Left eye: No discharge.     Conjunctiva/sclera: Conjunctivae normal.  Cardiovascular:     Rate and Rhythm: Normal rate and regular rhythm.  Pulmonary:     Effort: Pulmonary effort is normal.     Breath sounds: Normal breath sounds. No wheezing, rhonchi or rales.   Abdominal:     General: There is no distension.     Palpations: Abdomen is soft.     Tenderness: There is no abdominal tenderness.  Neurological:     Mental Status: She is alert.  Psychiatric:        Mood and Affect: Mood normal.        Behavior: Behavior normal.    Lab Results  Component Value Date   WBC 7.7 03/02/2021   HGB 10.7 (L) 03/02/2021   HCT 33.4 (L) 03/02/2021   PLT 294 03/02/2021   GLUCOSE 105 (H) 03/02/2021   CHOL 146 03/02/2021   TRIG 79 03/02/2021   HDL 58 03/02/2021   LDLCALC 73 03/02/2021   ALT 13 03/02/2021   AST 23 03/02/2021   NA 140 03/02/2021   K 5.0 03/02/2021   CL 105 03/02/2021   CREATININE 1.64 (H) 03/02/2021   BUN 36 (H) 03/02/2021   CO2 22 03/02/2021   HGBA1C 6.2 (H) 03/02/2021     Assessment & Plan:   Problem List Items Addressed This Visit       Cardiovascular and Mediastinum   Essential hypertension, benign    Stable.  Continue diltiazem, losartan, HCTZ.        Endocrine   Diabetes ([place])    Well-controlled.  Continue Amaryl.        Other   Hyperlipidemia    Stable.  Continue Lipitor.       Follow-up:  3-6 months.  [place]

## 2021-05-28 NOTE — Assessment & Plan Note (Signed)
Well-controlled.  Continue Amaryl.

## 2021-05-29 ENCOUNTER — Ambulatory Visit (INDEPENDENT_AMBULATORY_CARE_PROVIDER_SITE_OTHER): Payer: Medicare Other | Admitting: Podiatry

## 2021-05-29 ENCOUNTER — Ambulatory Visit (INDEPENDENT_AMBULATORY_CARE_PROVIDER_SITE_OTHER): Payer: Medicare Other

## 2021-05-29 DIAGNOSIS — M2042 Other hammer toe(s) (acquired), left foot: Secondary | ICD-10-CM

## 2021-05-29 DIAGNOSIS — E119 Type 2 diabetes mellitus without complications: Secondary | ICD-10-CM | POA: Diagnosis not present

## 2021-05-30 ENCOUNTER — Encounter: Payer: Medicare Other | Admitting: Physical Therapy

## 2021-05-30 NOTE — Progress Notes (Signed)
°  Subjective:  Patient ID: Stacy Webb, female    DOB: February 23, 1946,  MRN: [id number]  Chief Complaint  Patient presents with   Hammer Toe    76 y.o. female presents with the above complaint. History confirmed with patient.  Her second toe is painful it is contracted and sticks up and rubs on her shoes.  She has tried silicone toe caps.  Her A1c is well controlled at 6.5%.  Objective:  Physical Exam: warm, good capillary refill, no trophic changes or ulcerative lesions, normal DP and PT pulses, and normal sensory exam. Left Foot: Mild to moderate hallux valgus deformity as well as second hammertoe deformity semirigid in nature of the second toe, semirigid contractures 3 through 5   Radiographs: Multiple views x-ray of the left foot: Hallux valgus and hallux interphalangeus deformity with second hammertoe contracture of the second toe Assessment:   1. Hammertoe of left foot   2. Type 2 diabetes mellitus without complication, without long-term current use of insulin ([place])      Plan:  Patient was evaluated and treated and all questions answered.  Reviewed the x-rays with the patient and discussed the etiology and treatment options.  I discussed surgical and nonsurgical treatment.  Nonsurgical we discussed offloading shoe gear modifications and wearing silicone pads which she has tried so far.  Surgically I discussed 2 options with her: Elective amputation partially of the second toe to alleviate the prominent portions of the digit.  Cautioned her that this would result in loss of the toe and normal appearance of the foot but would likely be the easiest recovery 2 to 3 weeks in a surgical shoe for stitches to heal.  Alternatively could correct the toe with hammertoe correction with Kirschner wire fixation capsulotomy of the MTPJ, arthrodesis of the second PIPJ as well as a likely Aiken osteotomy of the hallux to prevent the hallux from rubbing on the second toe as well.  Discussed with her  this would be a more prolonged recovery with 6 to 8 weeks in a walking boot or shoe.  She will consider these options and return to see me when she is ready to schedule if she is interested in this.  Return when ready to schedule surgery.

## 2021-06-06 ENCOUNTER — Encounter: Payer: Medicare Other | Admitting: Physical Therapy

## 2021-06-13 ENCOUNTER — Encounter: Payer: Medicare Other | Admitting: Physical Therapy

## 2021-06-20 ENCOUNTER — Encounter: Payer: Medicare Other | Admitting: Physical Therapy

## 2021-06-27 ENCOUNTER — Encounter: Payer: Medicare Other | Admitting: Physical Therapy

## 2021-07-04 ENCOUNTER — Encounter: Payer: Medicare Other | Admitting: Physical Therapy

## 2021-07-10 ENCOUNTER — Encounter: Payer: Medicare Other | Admitting: Physical Therapy

## 2021-07-11 ENCOUNTER — Encounter: Payer: Self-pay | Admitting: Orthopedic Surgery

## 2021-07-11 ENCOUNTER — Ambulatory Visit (INDEPENDENT_AMBULATORY_CARE_PROVIDER_SITE_OTHER): Payer: Medicare Other | Admitting: Orthopedic Surgery

## 2021-07-11 ENCOUNTER — Ambulatory Visit: Payer: Medicare Other

## 2021-07-11 VITALS — Ht 66.0 in | Wt 141.0 lb

## 2021-07-11 DIAGNOSIS — Z96612 Presence of left artificial shoulder joint: Secondary | ICD-10-CM | POA: Diagnosis not present

## 2021-07-11 NOTE — Progress Notes (Signed)
Orthopaedic Postop Note ? ?Assessment: ?Stacy Webb is a 76 y.o. female s/p Left Reverse Shoulder Arthroplasty ? ?DOS: 07/10/20 ? ?Plan: ?Stacy Webb has done very well following left reverse shoulder arthroplasty.  She has no pain.  She still notes some weakness in her left shoulder, but otherwise she has good function.  She is pleased with her progress, and improvements following surgery.  Her right shoulder has responded well to an injection, and occasional pain is improved with ibuprofen as needed.  If she has any further issues with either shoulder, I am happy to see her in follow-up.  At this point, no follow-up is needed. ? ?Follow-up: ?Return if symptoms worsen or fail to improve. ? ?XR at next visit: Left shoulder ? ?Subjective: ? ?No chief complaint on file. ? ? ?History of Present Illness: ?Stacy Webb is a 76 y.o. female who presents following the above stated procedure.  Surgery was approximately 1 year ago.  She is doing well.  Her function has improved compared to prior to surgery.  She has no pain in the left shoulder.  She continues to have some difficulty with objects which are high in the cabinet, but otherwise, she has no complaints.  No issues with her incisions.  No numbness or tingling.  I recently injected her right shoulder, and this has responded well.  She has some pain in the shoulder, on occasion, and this responds well to ibuprofen. ? ? ?Review of Systems: ?No fevers or chills ?No numbness or tingling ?No Chest Pain ?No shortness of breath ? ? ? ?Objective: ?Ht '5\' 6"'$  (1.676 m)   Wt 141 lb (64 kg)   BMI 22.76 kg/m?  ? ?Physical Exam: ? ?Alert and oriented.  No acute distress ? ?Left shoulder surgical incision is well-healed.  No surrounding erythema or drainage.  No tenderness to palpation.  She has 160 degrees of forward flexion.  External rotation or side to 35 degrees.  Internal rotation to the lumbar spine.  Sensation is intact in the axillary nerve distribution.  Fingers are  warm and well-perfused.  2+ radial pulse. ? ?IMAGING: ?I personally ordered and reviewed the following images: ? ?X-rays of the left shoulder were obtained in clinic today.  There is a well-positioned reverse shoulder arthroplasty.  No evidence of subsidence.  No lucency around the prosthesis.  No acute injuries are noted. ? ?Impression: Left reverse shoulder arthroplasty in stable position ? ?[name], MD ?07/11/2021 ?9:03 AM ? ?

## 2021-07-16 ENCOUNTER — Ambulatory Visit (INDEPENDENT_AMBULATORY_CARE_PROVIDER_SITE_OTHER): Payer: Medicare Other | Admitting: Podiatry

## 2021-07-16 DIAGNOSIS — M2042 Other hammer toe(s) (acquired), left foot: Secondary | ICD-10-CM | POA: Diagnosis not present

## 2021-07-16 NOTE — Progress Notes (Signed)
?  Subjective:  ?Patient ID: Stacy Webb, female    DOB: 1945-04-13,  MRN: [id number] ? ?Chief Complaint  ?Patient presents with  ? Hammer Toe  ?   surgical consult hammertoe left foot  ? ? ?76 y.o. female presents with the above complaint. History confirmed with patient.  Her second toe is painful it is contracted and sticks up and rubs on her shoes.  She says she is interested in having it fixed now ? ?Objective:  ?Physical Exam: ?warm, good capillary refill, no trophic changes or ulcerative lesions, normal DP and PT pulses, and normal sensory exam. ?Left Foot: Mild to moderate hallux valgus deformity as well as second hammertoe deformity semirigid in nature of the second toe, semirigid contractures 3 through 5 ? ? ?Radiographs: ?Multiple views x-ray of the left foot: Hallux valgus and hallux interphalangeus deformity with second hammertoe contracture of the second toe ?Assessment:  ? ?1. Hammertoe of left foot   ? ? ? ?Plan:  ?Patient was evaluated and treated and all questions answered. ? ?We again reviewed her x-rays and discussed her surgical options.  I discussed a few options of treating with an Aiken osteotomy, Weil osteotomy and second hammertoe correction with flexor tendon transfer, I think this likely would be the most difficult recovery and would require prolonged immobilization in a boot.  This would be difficult for her as her she is also taking care of her daughter who had recent surgery and lost her husband.  Alternatively we discussed partial toe amputation she is hesitant to proceed with amputation.  Finally we also discussed a less corrective option of an arthroplasty with possible tendon transfer tenotomy or capsulotomy.  I think she is most interested this correction.  We initially plan to schedule her surgery today but she says she is dealing with a lot with her daughter and taking care of her and she will let me know when she is ready. ? ?No follow-ups on file.  ? ?

## 2021-07-18 ENCOUNTER — Encounter: Payer: Medicare Other | Admitting: Physical Therapy

## 2021-07-20 ENCOUNTER — Ambulatory Visit: Payer: Medicare Other | Admitting: Internal Medicine

## 2021-07-25 ENCOUNTER — Encounter: Payer: Medicare Other | Admitting: Physical Therapy

## 2021-08-01 ENCOUNTER — Encounter: Payer: Medicare Other | Admitting: Physical Therapy

## 2021-08-08 ENCOUNTER — Ambulatory Visit: Payer: Medicare Other | Admitting: Obstetrics and Gynecology

## 2021-08-20 ENCOUNTER — Telehealth: Payer: Self-pay | Admitting: *Deleted

## 2021-08-20 ENCOUNTER — Other Ambulatory Visit: Payer: Self-pay | Admitting: Family Medicine

## 2021-08-20 MED ORDER — DILTIAZEM HCL ER 240 MG PO TB24: 240.0000 mg | ORAL_TABLET | Freq: Every day | ORAL | 3 refills | Status: DC

## 2021-08-20 NOTE — Telephone Encounter (Signed)
Patient would like refill of Cardizem sent to [facility] ?

## 2021-08-21 NOTE — Telephone Encounter (Signed)
Prescription sent electronically to pharmacy. Patient aware. 

## 2021-08-22 ENCOUNTER — Telehealth: Payer: Self-pay

## 2021-08-22 NOTE — Telephone Encounter (Signed)
Error

## 2021-08-30 ENCOUNTER — Other Ambulatory Visit: Payer: Self-pay | Admitting: Family Medicine

## 2021-08-30 DIAGNOSIS — I10 Essential (primary) hypertension: Secondary | ICD-10-CM

## 2021-09-07 ENCOUNTER — Other Ambulatory Visit (HOSPITAL_COMMUNITY): Payer: Self-pay | Admitting: Family Medicine

## 2021-09-07 DIAGNOSIS — Z1231 Encounter for screening mammogram for malignant neoplasm of breast: Secondary | ICD-10-CM

## 2021-09-26 ENCOUNTER — Telehealth: Payer: Self-pay | Admitting: Family Medicine

## 2021-09-26 NOTE — Telephone Encounter (Signed)
Left message for patient to call back and schedule Medicare Annual Wellness Visit (AWV) in office.   If unable to come into the office for AWV,  please offer to do virtually or by telephone.  Last AWV: 09/22/2020  Please schedule at anytime with [facility]-Nurse Health Advisor.  30 minute appointment for Virtual or phone  45 minute appointment for in office or Initial virtual/phone  Any questions, please contact me at [phone]

## 2021-10-02 ENCOUNTER — Telehealth: Payer: Self-pay

## 2021-10-02 ENCOUNTER — Ambulatory Visit (INDEPENDENT_AMBULATORY_CARE_PROVIDER_SITE_OTHER): Payer: Medicare Other

## 2021-10-02 VITALS — Ht 66.0 in | Wt 141.0 lb

## 2021-10-02 DIAGNOSIS — Z0000 Encounter for general adult medical examination without abnormal findings: Secondary | ICD-10-CM

## 2021-10-02 NOTE — Telephone Encounter (Signed)
Pt also asks if rx for test strips can be sent to [facility] for her? Hers have expired. Thank you!

## 2021-10-02 NOTE — Telephone Encounter (Signed)
During AWV, pt states she has been issues with leg cramps during the last few weeks. Pt asks if she can have blood work done to maybe check her potassium? Thank you!

## 2021-10-02 NOTE — Progress Notes (Signed)
Subjective:   Stacy Webb is a 76 y.o. female who presents for Medicare Annual (Subsequent) preventive examination. Virtual Visit via Telephone Note  I connected with  Stacy Webb on 10/02/21 at 10:15 AM EDT by telephone and verified that I am speaking with the correct person using two identifiers.  Location: Patient: HOME Provider: [facility] Persons participating in the virtual visit: patient/Nurse Health Advisor   I discussed the limitations, risks, security and privacy concerns of performing an evaluation and management service by telephone and the availability of in person appointments. The patient expressed understanding and agreed to proceed.  Interactive audio and video telecommunications were attempted between this nurse and patient, however failed, due to patient having technical difficulties OR patient did not have access to video capability.  We continued and completed visit with audio only.  Some vital signs may be absent or patient reported.   Darral Dash, LPN  Review of Systems     Cardiac Risk Factors include: advanced age (>38men, >33 women);diabetes mellitus;hypertension;dyslipidemia;sedentary lifestyle     Objective:    Today's Vitals   10/02/21 1016  Weight: 141 lb (64 kg)  Height: 5\' 6"  (1.676 m)   Body mass index is 22.76 kg/m.     10/02/2021   10:27 AM 05/07/2021    3:38 PM 03/09/2021   11:09 PM 10/22/2020    9:05 AM 09/22/2020    9:55 AM 07/25/2020    2:37 PM 07/10/2020    3:00 PM  Advanced Directives  Does Patient Have a Medical Advance Directive? Yes Yes No No No Yes Yes  Type of Estate agent of [place];Living will     Living will Living will  Does patient want to make changes to medical advance directive?  No - Patient declined    No - Patient declined No - Patient declined  Copy of Healthcare Power of Attorney in Chart? No - copy requested        Would patient like information on creating a medical advance  directive?   No - Patient declined No - Patient declined Yes (MAU/Ambulatory/Procedural Areas - Information given)      Current Medications (verified) Outpatient Encounter Medications as of 10/02/2021  Medication Sig   Apoaequorin (PREVAGEN) 10 MG CAPS Take by mouth.   atorvastatin (LIPITOR) 20 MG tablet Take 20 mg by mouth daily.   blood glucose meter kit and supplies Dispense based on patient and insurance preference. Use up to four times daily as directed. (FOR ICD-10 E10.9, E11.9).   Dextromethorphan Polistirex (ROBITUSSIN 12 HOUR COUGH PO) Take by mouth.   diltiazem (CARDIZEM LA) 240 MG 24 hr tablet Take 1 tablet (240 mg total) by mouth daily.   diltiazem (CARDIZEM LA) 240 MG 24 hr tablet Take 1 tablet (240 mg total) by mouth daily.   docusate sodium (COLACE) 100 MG capsule Take 200 mg by mouth at bedtime.   glimepiride (AMARYL) 1 MG tablet Take 1 tablet (1 mg total) by mouth daily with breakfast.   Glucagon (GVOKE HYPOPEN 2-PACK) 0.5 MG/0.1ML SOAJ Inject 1 each into the skin as needed. Inject if blood sugar is < 55 mg/dL   hydrochlorothiazide (HYDRODIURIL) 25 MG tablet TAKE 1 TABLET BY MOUTH  DAILY   losartan (COZAAR) 50 MG tablet TAKE 1 TABLET BY MOUTH  DAILY   Multiple Vitamins-Minerals (VITAMIN D3 COMPLETE PO) Take by mouth.   Multiple Vitamins-Minerals (ZINC PO) Take 1 Dose by mouth daily.   vitamin B-12 (CYANOCOBALAMIN) 500 MCG  tablet Take 500 mcg by mouth 2 (two) times daily.    [DISCONTINUED] conjugated estrogens (PREMARIN) vaginal cream Place 0.25 Applicatorfuls vaginally 3 (three) times a week. For vaginal thinning and irritation in preop timeframe (Patient not taking: No sig reported)   [DISCONTINUED] ferrous sulfate 325 (65 FE) MG tablet Take 325 mg by mouth daily with breakfast.    No facility-administered encounter medications on file as of 10/02/2021.    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)    Diabetes mellitus without complication (HCC)    Diabetic foot ulcer (HCC) 09/27/2016   Excessive hair growth 07/19/2015   Heart murmur    History of kidney stones    Hyperlipidemia    Hypertension    Neuromuscular disorder (HCC)    diabetic neuropathy   Neuropathy    Postmenopausal 07/19/2015   Past Surgical History:  Procedure Laterality Date   ANTERIOR AND POSTERIOR REPAIR N/A 04/28/2018   Procedure: ANTERIOR (CYSTOCELE) AND POSTERIOR REPAIR (RECTOCELE);  Surgeon: Tilda Burrow, MD;  Location: [facility];  Service: Gynecology;  Laterality: N/A;   APPENDECTOMY     CARPAL TUNNEL RELEASE Bilateral April 23, 1999   CESAREAN SECTION     COLONOSCOPY N/A 08/28/2012   Procedure: COLONOSCOPY;  Surgeon: Malissa Hippo, MD;  Location: [facility];  Service: Endoscopy;  Laterality: N/A;  915   COLONOSCOPY N/A 12/25/2017   Procedure: COLONOSCOPY;  Surgeon: Malissa Hippo, MD;  Location: [facility];  Service: Endoscopy;  Laterality: N/A;  12:45   REVERSE SHOULDER ARTHROPLASTY Left 07/10/2020   Procedure: REVERSE SHOULDER ARTHROPLASTY;  Surgeon: Oliver Barre, MD;  Location: [facility];  Service: Orthopedics;  Laterality: Left;   Family History  Problem Relation Age of Onset   Pneumonia Son    Suicidality Son    Diabetes Daughter    Colon cancer Paternal Aunt    Social History   Socioeconomic History   Marital status: Widowed    Spouse name: Not on file   Number of children: 3   Years of education: Not on file   Highest education level: Not on file  Occupational History   Occupation: [employer]    Comment: Medical Records  Tobacco Use   Smoking status: Never   Smokeless tobacco: Never  Vaping Use   Vaping Use: Never used  Substance and Sexual Activity   Alcohol use: No   Drug use: No   Sexual activity: Not Currently    Birth control/protection: Post-menopausal  Other Topics Concern   Not on file   Social History Narrative   Husband had ALS and passed away.Twin boys, but one died at 49 months and her other son died in 04/22/2014.      Living daughter- lives with pt.   Social Determinants of Health   Financial Resource Strain: Low Risk  (10/02/2021)   Overall Financial Resource Strain (CARDIA)    Difficulty of Paying Living Expenses: Not hard at all  Food Insecurity: No Food Insecurity (10/02/2021)   Hunger Vital Sign    Worried About Running Out of Food in the Last Year: Never true    Ran Out of Food in the Last Year: Never true  Transportation Needs: No Transportation Needs (10/02/2021)   PRAPARE - Transportation    Lack of Transportation (Medical): No    Lack  of Transportation (Non-Medical): No  Physical Activity: Insufficiently Active (10/02/2021)   Exercise Vital Sign    Days of Exercise per Week: 2 days    Minutes of Exercise per Session: 60 min  Stress: No Stress Concern Present (10/02/2021)   [employer] of Occupational Health - Occupational Stress Questionnaire    Feeling of Stress : Not at all  Social Connections: Socially Integrated (10/02/2021)   Social Connection and Isolation Panel [NHANES]    Frequency of Communication with Friends and Family: More than three times a week    Frequency of Social Gatherings with Friends and Family: More than three times a week    Attends Religious Services: More than 4 times per year    Active Member of [employer] or Organizations: Yes    Attends Engineer, structural: More than 4 times per year    Marital Status: Married    Tobacco Counseling Counseling given: Not Answered   Clinical Intake:  Pre-visit preparation completed: Yes  Pain : No/denies pain     BMI - recorded: 22.76 Nutritional Status: BMI of 19-24  Normal Nutritional Risks: None Diabetes: No  How often do you need to have someone help you when you read instructions, pamphlets, or other written materials from your doctor or pharmacy?: 1 -  Never  Diabetic?Nutrition Risk Assessment:  Has the patient had any N/V/D within the last 2 months?  No  Does the patient have any non-healing wounds?  No  Has the patient had any unintentional weight loss or weight gain?  No   Diabetes:  Is the patient diabetic?  Yes  If diabetic, was a CBG obtained today?  No  Did the patient bring in their glucometer from home?  No  How often do you monitor your CBG's? daily.   Financial Strains and Diabetes Management:  Are you having any financial strains with the device, your supplies or your medication? No .  Does the patient want to be seen by Chronic Care Management for management of their diabetes?  No  Would the patient like to be referred to a Nutritionist or for Diabetic Management?  No   Diabetic Exams:  Diabetic Eye Exam: Completed 2022.  Pt has been advised about the importance in completing this exam. A referral has been placed today. Message sent to referral coordinator for scheduling purposes. Advised pt to expect a call from office referred to regarding appt.  Diabetic Foot Exam: Completed 03/16/2021. Pt has been advised about the importance in completing this exam.   Interpreter Needed?: No  Information entered by :: mj Ancelmo Hunt, lpn   Activities of Daily Living    10/02/2021   10:29 AM  In your present state of health, do you have any difficulty performing the following activities:  Hearing? 0  Vision? 0  Difficulty concentrating or making decisions? 0  Walking or climbing stairs? 0  Dressing or bathing? 0  Doing errands, shopping? 0  Preparing Food and eating ? N  Using the Toilet? N  In the past six months, have you accidently leaked urine? Y  Comment at times.  Do you have problems with loss of bowel control? N  Managing your Medications? N  Managing your Finances? N  Housekeeping or managing your Housekeeping? N    Patient Care Team: [name], DO as PCP - General (Family Medicine)  Indicate any recent  Medical Services you may have received from other than [facility] providers in the past year (date may be approximate).  Assessment:   This is a routine wellness examination for Stacy Webb.  Hearing/Vision screen Hearing Screening - Comments:: No hearing issues.  Vision Screening - Comments:: Glasses for reading. Dr. Charise Killian. 2022.  Dietary issues and exercise activities discussed: Current Exercise Habits: Structured exercise class, Type of exercise: Other - see comments, Time (Minutes): 60, Frequency (Times/Week): 2, Weekly Exercise (Minutes/Week): 120, Intensity: Mild, Exercise limited by: cardiac condition(s);orthopedic condition(s)   Goals Addressed             This Visit's Progress    Exercise 3x per week (30 min per time)       Continue to stay active and healthy.       Depression Screen    10/02/2021   10:24 AM 05/28/2021   10:22 AM 04/26/2021   10:17 AM 03/21/2021    8:41 AM 12/08/2020    9:48 AM 11/03/2020    9:06 AM 10/26/2020   10:50 AM  PHQ 2/9 Scores  PHQ - 2 Score 0 0 0 0 0 2 2  PHQ- 9 Score     1 7 7     Fall Risk    10/02/2021   10:27 AM 05/28/2021   10:22 AM 05/02/2021    8:51 AM 04/26/2021   10:17 AM 03/21/2021    8:40 AM  Fall Risk   Falls in the past year? 0 0 1 1 1   Number falls in past yr: 0 0 1 1 0  Injury with Fall? 0 0 1 1 1   Risk for fall due to : No Fall Risks No Fall Risks   History of fall(s)  Follow up Falls prevention discussed Falls evaluation completed   Falls evaluation completed    FALL RISK PREVENTION PERTAINING TO THE HOME:  Any stairs in or around the home? Yes  If so, are there any without handrails? No  Home free of loose throw rugs in walkways, pet beds, electrical cords, etc? Yes  Adequate lighting in your home to reduce risk of falls? Yes   ASSISTIVE DEVICES UTILIZED TO PREVENT FALLS:  Life alert? Yes  Use of a cane, [place] or w/c? Yes  Grab bars in the bathroom? Yes  Shower chair or bench in shower?  Walk in shower Elevated  toilet seat or a handicapped toilet? Yes   TIMED UP AND GO:  Was the test performed? No .  Phone visit.   Cognitive Function:        10/02/2021   10:30 AM 09/22/2020   10:02 AM  6CIT Screen  What Year? 0 points 0 points  What month? 0 points 0 points  What time? 0 points 0 points  Count back from 20 0 points 0 points  Months in reverse 0 points 0 points  Repeat phrase 0 points 0 points  Total Score 0 points 0 points    Immunizations Immunization History  Administered Date(s) Administered   Fluad Quad(high Dose 65+) 12/08/2020   Moderna SARS-COV2 Booster Vaccination 02/24/2020   Moderna Sars-Covid-2 Vaccination 05/23/2019, 06/21/2019, 10/18/2020   Tdap 11/23/2018    TDAP status: Up to date  Flu Vaccine status: Up to date  Pneumococcal vaccine status: Due, Education has been provided regarding the importance of this vaccine. Advised may receive this vaccine at local pharmacy or Health Dept. Aware to provide a copy of the vaccination record if obtained from local pharmacy or Health Dept. Verbalized acceptance and understanding.  Covid-19 vaccine status: Completed vaccines  Qualifies for Shingles Vaccine? Yes   Zostavax completed No  Shingrix Completed?: No.    Education has been provided regarding the importance of this vaccine. Patient has been advised to call insurance company to determine out of pocket expense if they have not yet received this vaccine. Advised may also receive vaccine at local pharmacy or Health Dept. Verbalized acceptance and understanding.  Screening Tests Health Maintenance  Topic Date Due   Pneumonia Vaccine 39+ Years old (1 - PCV) Never done   Zoster Vaccines- Shingrix (1 of 2) Never done   COVID-19 Vaccine (4 - Booster for Moderna series) 12/13/2020   HEMOGLOBIN A1C  08/30/2021   INFLUENZA VACCINE  11/06/2021   OPHTHALMOLOGY EXAM  03/08/2022   FOOT EXAM  03/16/2022   COLONOSCOPY (Pts 45-16yrs Insurance coverage will need to be confirmed)   12/26/2027   TETANUS/TDAP  11/22/2028   DEXA SCAN  Completed   Hepatitis C Screening  Completed   HPV VACCINES  Aged Out    Health Maintenance  Health Maintenance Due  Topic Date Due   Pneumonia Vaccine 90+ Years old (1 - PCV) Never done   Zoster Vaccines- Shingrix (1 of 2) Never done   COVID-19 Vaccine (4 - Booster for Moderna series) 12/13/2020   HEMOGLOBIN A1C  08/30/2021    Colorectal cancer screening: No longer required.   Mammogram status: Completed 10/17/2020. Repeat every year  Bone Density status: Completed 11/28/2008. Results reflect: Bone density results: NORMAL. Repeat every 5 years.  Lung Cancer Screening: (Low Dose CT Chest recommended if Age 17-80 years, 30 pack-year currently smoking OR have quit w/in 15years.) does not qualify.   Additional Screening:  Hepatitis C Screening: does not qualify; Completed 12/06/2020  Vision Screening: Recommended annual ophthalmology exams for early detection of glaucoma and other disorders of the eye. Is the patient up to date with their annual eye exam?  Yes  Who is the provider or what is the name of the office in which the patient attends annual eye exams? Dr. Charise Killian If pt is not established with a provider, would they like to be referred to a provider to establish care? No .   Dental Screening: Recommended annual dental exams for proper oral hygiene  Community Resource Referral / Chronic Care Management: CRR required this visit?  No   CCM required this visit?  No      Plan:     I have personally reviewed and noted the following in the patient's chart:   Medical and social history Use of alcohol, tobacco or illicit drugs  Current medications and supplements including opioid prescriptions.  Functional ability and status Nutritional status Physical activity Advanced directives List of other physicians Hospitalizations, surgeries, and ER visits in previous 12 months Vitals Screenings to include cognitive,  depression, and falls Referrals and appointments  In addition, I have reviewed and discussed with patient certain preventive protocols, quality metrics, and best practice recommendations. A written personalized care plan for preventive services as well as general preventive health recommendations were provided to patient.     Darral Dash, LPN   1/61/0960   Nurse Notes: Discussed Prevnar-20 and Shingrix and how to obtain.

## 2021-10-17 ENCOUNTER — Ambulatory Visit (HOSPITAL_COMMUNITY)
Admission: RE | Admit: 2021-10-17 | Discharge: 2021-10-17 | Disposition: A | Discharge status: Home / Self Care | Payer: Medicare Other | Source: Ambulatory Visit | Attending: Family Medicine | Admitting: Family Medicine

## 2021-10-17 DIAGNOSIS — Z1231 Encounter for screening mammogram for malignant neoplasm of breast: Secondary | ICD-10-CM | POA: Diagnosis not present

## 2021-10-18 ENCOUNTER — Other Ambulatory Visit (HOSPITAL_COMMUNITY): Payer: Self-pay | Admitting: Family Medicine

## 2021-10-18 DIAGNOSIS — D631 Anemia in chronic kidney disease: Secondary | ICD-10-CM | POA: Diagnosis not present

## 2021-10-18 DIAGNOSIS — I129 Hypertensive chronic kidney disease with stage 1 through stage 4 chronic kidney disease, or unspecified chronic kidney disease: Secondary | ICD-10-CM | POA: Diagnosis not present

## 2021-10-18 DIAGNOSIS — R928 Other abnormal and inconclusive findings on diagnostic imaging of breast: Secondary | ICD-10-CM

## 2021-10-18 DIAGNOSIS — E1122 Type 2 diabetes mellitus with diabetic chronic kidney disease: Secondary | ICD-10-CM | POA: Diagnosis not present

## 2021-10-18 DIAGNOSIS — N189 Chronic kidney disease, unspecified: Secondary | ICD-10-CM | POA: Diagnosis not present

## 2021-10-23 ENCOUNTER — Other Ambulatory Visit (HOSPITAL_COMMUNITY): Payer: Self-pay | Admitting: Family Medicine

## 2021-10-23 ENCOUNTER — Ambulatory Visit (HOSPITAL_COMMUNITY)
Admission: RE | Admit: 2021-10-23 | Discharge: 2021-10-23 | Disposition: A | Discharge status: Home / Self Care | Payer: Medicare Other | Source: Ambulatory Visit | Attending: Family Medicine | Admitting: Family Medicine

## 2021-10-23 DIAGNOSIS — R928 Other abnormal and inconclusive findings on diagnostic imaging of breast: Secondary | ICD-10-CM

## 2021-10-23 DIAGNOSIS — N6325 Unspecified lump in the left breast, overlapping quadrants: Secondary | ICD-10-CM | POA: Diagnosis not present

## 2021-10-25 DIAGNOSIS — N170 Acute kidney failure with tubular necrosis: Secondary | ICD-10-CM | POA: Diagnosis not present

## 2021-10-25 DIAGNOSIS — I129 Hypertensive chronic kidney disease with stage 1 through stage 4 chronic kidney disease, or unspecified chronic kidney disease: Secondary | ICD-10-CM | POA: Diagnosis not present

## 2021-10-25 DIAGNOSIS — N189 Chronic kidney disease, unspecified: Secondary | ICD-10-CM | POA: Diagnosis not present

## 2021-10-25 DIAGNOSIS — E1122 Type 2 diabetes mellitus with diabetic chronic kidney disease: Secondary | ICD-10-CM | POA: Diagnosis not present

## 2021-10-25 DIAGNOSIS — D631 Anemia in chronic kidney disease: Secondary | ICD-10-CM | POA: Diagnosis not present

## 2021-11-06 ENCOUNTER — Ambulatory Visit (HOSPITAL_COMMUNITY)
Admission: RE | Admit: 2021-11-06 | Discharge: 2021-11-06 | Disposition: A | Discharge status: Home / Self Care | Payer: Medicare Other | Source: Ambulatory Visit | Attending: Family Medicine | Admitting: Family Medicine

## 2021-11-06 ENCOUNTER — Encounter (HOSPITAL_COMMUNITY): Payer: Self-pay

## 2021-11-06 ENCOUNTER — Other Ambulatory Visit (HOSPITAL_COMMUNITY): Payer: Self-pay | Admitting: Family Medicine

## 2021-11-06 DIAGNOSIS — R928 Other abnormal and inconclusive findings on diagnostic imaging of breast: Secondary | ICD-10-CM

## 2021-11-06 DIAGNOSIS — C50412 Malignant neoplasm of upper-outer quadrant of left female breast: Secondary | ICD-10-CM | POA: Diagnosis not present

## 2021-11-06 DIAGNOSIS — C50812 Malignant neoplasm of overlapping sites of left female breast: Secondary | ICD-10-CM | POA: Diagnosis not present

## 2021-11-06 MED ORDER — LIDOCAINE-EPINEPHRINE (PF) 1 %-1:200000 IJ SOLN
10.0000 mL | Freq: Once | INTRAMUSCULAR | Status: AC
  Administered 2021-11-06: 10 mL via INTRADERMAL

## 2021-11-06 MED ORDER — LIDOCAINE HCL (PF) 2 % IJ SOLN
INTRAMUSCULAR | Status: AC
  Administered 2021-11-06: 10 mL
  Filled 2021-11-06: qty 10

## 2021-11-06 NOTE — Progress Notes (Signed)
PT tolerated left breast biopsy well today with no acute distress noted. PT verbalized understanding of discharge instructions. PT ambulated back to the mammogram area this time and given ice packs to take home.

## 2021-11-07 ENCOUNTER — Telehealth: Payer: Self-pay | Admitting: Family Medicine

## 2021-11-08 ENCOUNTER — Other Ambulatory Visit: Payer: Self-pay | Admitting: *Deleted

## 2021-11-08 DIAGNOSIS — M79671 Pain in right foot: Secondary | ICD-10-CM | POA: Diagnosis not present

## 2021-11-08 DIAGNOSIS — C50919 Malignant neoplasm of unspecified site of unspecified female breast: Secondary | ICD-10-CM

## 2021-11-08 DIAGNOSIS — E1140 Type 2 diabetes mellitus with diabetic neuropathy, unspecified: Secondary | ICD-10-CM | POA: Diagnosis not present

## 2021-11-08 DIAGNOSIS — L110 Acquired keratosis follicularis: Secondary | ICD-10-CM | POA: Diagnosis not present

## 2021-11-08 DIAGNOSIS — M79672 Pain in left foot: Secondary | ICD-10-CM | POA: Diagnosis not present

## 2021-11-08 DIAGNOSIS — I739 Peripheral vascular disease, unspecified: Secondary | ICD-10-CM | POA: Diagnosis not present

## 2021-11-08 LAB — SURGICAL PATHOLOGY

## 2021-11-09 NOTE — Progress Notes (Signed)
Introductory phone call placed to patient today. Introduced myself and explained my role in the patient's care. Provided a reminder of patient's upcoming appt. Patient made aware of clinic's location and visitor policy. No further questions at this time per patient.  

## 2021-11-12 ENCOUNTER — Inpatient Hospital Stay: Payer: Medicare Other | Attending: Hematology | Admitting: Hematology

## 2021-11-12 DIAGNOSIS — E785 Hyperlipidemia, unspecified: Secondary | ICD-10-CM

## 2021-11-12 DIAGNOSIS — I10 Essential (primary) hypertension: Secondary | ICD-10-CM | POA: Diagnosis not present

## 2021-11-12 DIAGNOSIS — Z800 Family history of malignant neoplasm of digestive organs: Secondary | ICD-10-CM | POA: Diagnosis not present

## 2021-11-12 DIAGNOSIS — Z79899 Other long term (current) drug therapy: Secondary | ICD-10-CM

## 2021-11-12 DIAGNOSIS — Z87442 Personal history of urinary calculi: Secondary | ICD-10-CM

## 2021-11-12 DIAGNOSIS — Z881 Allergy status to other antibiotic agents status: Secondary | ICD-10-CM

## 2021-11-12 DIAGNOSIS — Z801 Family history of malignant neoplasm of trachea, bronchus and lung: Secondary | ICD-10-CM

## 2021-11-12 DIAGNOSIS — Z836 Family history of other diseases of the respiratory system: Secondary | ICD-10-CM

## 2021-11-12 DIAGNOSIS — Z833 Family history of diabetes mellitus: Secondary | ICD-10-CM

## 2021-11-12 DIAGNOSIS — C50412 Malignant neoplasm of upper-outer quadrant of left female breast: Secondary | ICD-10-CM | POA: Diagnosis not present

## 2021-11-12 DIAGNOSIS — Z9049 Acquired absence of other specified parts of digestive tract: Secondary | ICD-10-CM

## 2021-11-12 DIAGNOSIS — Z808 Family history of malignant neoplasm of other organs or systems: Secondary | ICD-10-CM

## 2021-11-12 DIAGNOSIS — Z170 Estrogen receptor positive status [ER+]: Secondary | ICD-10-CM

## 2021-11-12 DIAGNOSIS — Z780 Asymptomatic menopausal state: Secondary | ICD-10-CM | POA: Insufficient documentation

## 2021-11-12 DIAGNOSIS — E119 Type 2 diabetes mellitus without complications: Secondary | ICD-10-CM

## 2021-11-12 DIAGNOSIS — Z818 Family history of other mental and behavioral disorders: Secondary | ICD-10-CM

## 2021-11-12 NOTE — Progress Notes (Signed)
[facility] NOTE  Patient Care Team: Coral Spikes, DO as PCP - General (Family Medicine)  CHIEF COMPLAINTS/PURPOSE OF CONSULTATION:  Newly diagnosed Left breast cancer  HISTORY OF PRESENTING ILLNESS:  Stacy Webb 76 y.o. female is here because of recent diagnosis of left breast cancer.  She underwent routine mammogram on 10/17/2021 which showed possible distortion in the left breast.  Diagnostic mammogram on 10/24/2021 showed small irregular hypoechoic mass in the left breast at 3 o'clock position measuring 0.5 x 0.5 x 0.4 cm.  No lymphadenopathy in the left axilla on ultrasound.  She underwent biopsy on 11/06/2021 which showed invasive ductal carcinoma, grade 1, ER/PR positive, HER2 negative.  She denies any new onset pains.  She has an appointment to see Dr. Constance Haw on 11/21/2021.  I reviewed her records extensively and collaborated the history with the patient.  SUMMARY OF ONCOLOGIC HISTORY: Oncology History   No history exists.    In terms of breast cancer risk profile:  She menarched at early age of 50 and went to menopause at age early 48s She had 3 pregnancies, her first child was born at age 38 She did not take birth control pills. She was never exposed to fertility medications or hormone replacement therapy.    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[place])    Diabetes mellitus without complication ([place])    Diabetic foot ulcer ([place]) 09/27/2016   Excessive hair growth 07/19/2015   Heart murmur    History of kidney stones    Hyperlipidemia    Hypertension    Neuromuscular disorder ([place])    diabetic neuropathy   Neuropathy    Postmenopausal 07/19/2015    SURGICAL HISTORY: Past Surgical History:  Procedure Laterality Date   ANTERIOR AND POSTERIOR REPAIR N/A 04/28/2018   Procedure: ANTERIOR (CYSTOCELE) AND POSTERIOR REPAIR (RECTOCELE);   Surgeon: Jonnie Kind, MD;  Location: [facility];  Service: Gynecology;  Laterality: N/A;   APPENDECTOMY     CARPAL TUNNEL RELEASE Bilateral 2001   CESAREAN SECTION     COLONOSCOPY N/A 08/28/2012   Procedure: COLONOSCOPY;  Surgeon: Rogene Houston, MD;  Location: [facility];  Service: Endoscopy;  Laterality: N/A;  915   COLONOSCOPY N/A 12/25/2017   Procedure: COLONOSCOPY;  Surgeon: Rogene Houston, MD;  Location: [facility];  Service: Endoscopy;  Laterality: N/A;  12:45   REVERSE SHOULDER ARTHROPLASTY Left 07/10/2020   Procedure: REVERSE SHOULDER ARTHROPLASTY;  Surgeon: [name], MD;  Location: [facility];  Service: Orthopedics;  Laterality: Left;    SOCIAL HISTORY: Social History   Socioeconomic History   Marital status: Widowed    Spouse name: Not on file   Number of children: 3   Years of education: Not on file   Highest education level: Not on file  Occupational History   Occupation: [employer]    Comment: Medical Records  Tobacco Use   Smoking status: Never   Smokeless tobacco: Never  Vaping Use   Vaping Use: Never used  Substance and Sexual Activity   Alcohol use: No   Drug use: No   Sexual activity: Not Currently    Birth control/protection: Post-menopausal  Other Topics Concern   Not on file  Social History Narrative   Husband had ALS and passed away.Twin boys, but one died at 62 months and  her other son died in 2016.      Living daughter- lives with pt.   Social Determinants of Health   Financial Resource Strain: Low Risk  (10/02/2021)   Overall Financial Resource Strain (CARDIA)    Difficulty of Paying Living Expenses: Not hard at all  Food Insecurity: No Food Insecurity (10/02/2021)   Hunger Vital Sign    Worried About Running Out of Food in the Last Year: Never true    Ran Out of Food in the Last Year: Never true  Transportation Needs: No Transportation Needs (10/02/2021)   PRAPARE - Hydrologist (Medical): No    Lack of  Transportation (Non-Medical): No  Physical Activity: Insufficiently Active (10/02/2021)   Exercise Vital Sign    Days of Exercise per Week: 2 days    Minutes of Exercise per Session: 60 min  Stress: No Stress Concern Present (10/02/2021)   [place]    Feeling of Stress : Not at all  Social Connections: [place] (10/02/2021)   Social Connection and Isolation Panel [NHANES]    Frequency of Communication with Friends and Family: More than three times a week    Frequency of Social Gatherings with Friends and Family: More than three times a week    Attends Religious Services: More than 4 times per year    Active Member of [employer] or Organizations: Yes    Attends Music therapist: More than 4 times per year    Marital Status: Married  Human resources officer Violence: Not At Risk (10/02/2021)   Humiliation, Afraid, Rape, and Kick questionnaire    Fear of Current or Ex-Partner: No    Emotionally Abused: No    Physically Abused: No    Sexually Abused: No    FAMILY HISTORY: Family History  Problem Relation Age of Onset   Pneumonia Son    Suicidality Son    Diabetes Daughter    Colon cancer Paternal Aunt     ALLERGIES:  is allergic to clindamycin/lincomycin and enalapril.  MEDICATIONS:  Current Outpatient Medications  Medication Sig Dispense Refill   Apoaequorin (PREVAGEN) 10 MG CAPS Take by mouth.     atorvastatin (LIPITOR) 20 MG tablet Take 20 mg by mouth daily.     blood glucose meter kit and supplies Dispense based on patient and insurance preference. Use up to four times daily as directed. (FOR ICD-10 E10.9, E11.9). 1 each 0   Dextromethorphan Polistirex (ROBITUSSIN 12 HOUR COUGH PO) Take by mouth.     diltiazem (CARDIZEM LA) 240 MG 24 hr tablet Take 1 tablet (240 mg total) by mouth daily. 90 tablet 1   diltiazem (CARDIZEM LA) 240 MG 24 hr tablet Take 1 tablet (240 mg total) by mouth daily. 90 tablet  3   docusate sodium (COLACE) 100 MG capsule Take 200 mg by mouth at bedtime.     glimepiride (AMARYL) 1 MG tablet Take 1 tablet (1 mg total) by mouth daily with breakfast. 90 tablet 3   Glucagon (GVOKE HYPOPEN 2-PACK) 0.5 MG/0.1ML SOAJ Inject 1 each into the skin as needed. Inject if blood sugar is < 55 mg/dL 0.2 mL 1   hydrochlorothiazide (HYDRODIURIL) 25 MG tablet TAKE 1 TABLET BY MOUTH  DAILY 100 tablet 2   losartan (COZAAR) 25 MG tablet Take by mouth.     Multiple Vitamins-Minerals (VITAMIN D3 COMPLETE PO) Take by mouth.     Multiple Vitamins-Minerals (ZINC PO) Take 1 Dose  by mouth daily.     vitamin B-12 (CYANOCOBALAMIN) 500 MCG tablet Take 500 mcg by mouth 2 (two) times daily.      No current facility-administered medications for this visit.    REVIEW OF SYSTEMS:   Constitutional: Denies fevers, chills or abnormal night sweats Eyes: Denies blurriness of vision, double vision or watery eyes Ears, nose, mouth, throat, and face: Denies mucositis or sore throat Respiratory: Denies cough, dyspnea or wheezes Cardiovascular: Denies palpitation, chest discomfort or lower extremity swelling Gastrointestinal:  Denies nausea, heartburn or change in bowel habits Skin: Denies abnormal skin rashes Lymphatics: Denies new lymphadenopathy or easy bruising Neurological:Denies numbness, tingling or new weaknesses Behavioral/Psych: Mood is stable, no new changes  Breast:  Denies any palpable lumps or discharge All other systems were reviewed with the patient and are negative.  PHYSICAL EXAMINATION: ECOG PERFORMANCE STATUS: 1 - Symptomatic but completely ambulatory  Vitals:   11/12/21 0817  BP: (!) 178/71  Pulse: 63  Resp: 18  Temp: (!) 97.1 F (36.2 C)  SpO2: 100%   Filed Weights   11/12/21 0817  Weight: 145 lb 6.4 oz (66 kg)    GENERAL:alert, no distress and comfortable SKIN: skin color, texture, turgor are normal, no rashes or significant lesions EYES: normal, conjunctiva are pink  and non-injected, sclera clear OROPHARYNX:no exudate, no erythema and lips, buccal mucosa, and tongue normal  NECK: supple, thyroid normal size, non-tender, without nodularity LYMPH:  no palpable lymphadenopathy in the cervical, axillary or inguinal LUNGS: clear to auscultation and percussion with normal breathing effort HEART: regular rate & rhythm and no murmurs and no lower extremity edema ABDOMEN:abdomen soft, non-tender and normal bowel sounds Musculoskeletal:no cyanosis of digits and no clubbing  PSYCH: alert & oriented x 3 with fluent speech NEURO: no focal motor/sensory deficits BREAST: No palpable nodules in breast. No palpable axillary or supraclavicular lymphadenopathy (exam performed in the presence of a chaperone)   LABORATORY DATA:  I have reviewed the data as listed Lab Results  Component Value Date   WBC 7.7 03/02/2021   HGB 10.7 (L) 03/02/2021   HCT 33.4 (L) 03/02/2021   MCV 85 03/02/2021   PLT 294 03/02/2021   Lab Results  Component Value Date   NA 140 03/02/2021   K 5.0 03/02/2021   CL 105 03/02/2021   CO2 22 03/02/2021    RADIOGRAPHIC STUDIES: I have personally reviewed the radiological reports and agreed with the findings in the report.  ASSESSMENT :  1.  Stage I (T1AN0G1) left breast IDC, ER/PR positive, HER2 negative: - Screening mammogram (10/17/2021): Abnormal. - Diagnostic mammogram/ultrasound (10/23/2021): Irregular hypoechoic mass in the left breast at 3:00 measuring 0.5 x 0.5 x 0.4 cm.  No lymphadenopathy in the left axilla. - Biopsy (11/06/2021): Invasive ductal carcinoma, grade 1, ER 90% strong staining, PR 40%, Ki-67 1%, HER2 1+  2.  Social/family history: - She lives by herself at home.  Daughter is temporarily living with her.  She is independent of ADLs and IADLs.  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  Never smoker. - Father had lung cancer and was smoker.  Paternal aunt had cancer of the female organs.  Brother  had throat cancer and was smoker.  PLAN:  1.  Stage I (T1AN0G1) left breast IDC, ER/PR positive, HER2 negative: - We have discussed the results on the scans and biopsy in detail. - She may proceed with left breast lumpectomy.  Based on choosing wisely guidelines, she may  forego SLNB given her age and small size of the tumor and strong ER positivity. - She has an appointment to see Dr. Constance Haw on 11/21/2021. - RTC 4 weeks after surgery to discuss pathology and further plan.  2.  Bone health: - We will obtain bone density test and also check her vitamin D level.    All questions were answered. The patient knows to call the clinic with any problems, questions or concerns.    Derek Jack, MD 11/12/21

## 2021-11-12 NOTE — Patient Instructions (Addendum)
[place] at [facility] Discharge Instructions   You were seen and examined today by Dr. Delton Coombes. You are seeing him today for your recent diagnosis of breast cancer.   Once you have surgery you will not require any further testing. After surgery, we will see you back to go over the report.   Return as scheduled.    Thank you for choosing [place] at [facility] to provide your oncology and hematology care.  To afford each patient quality time with our provider, please arrive at least 15 minutes before your scheduled appointment time.   If you have a lab appointment with the [place] please come in thru the Main Entrance and check in at the main information desk.  You need to re-schedule your appointment should you arrive 10 or more minutes late.  We strive to give you quality time with our providers, and arriving late affects you and other patients whose appointments are after yours.  Also, if you no show three or more times for appointments you may be dismissed from the clinic at the providers discretion.     Again, thank you for choosing [facility].  Our hope is that these requests will decrease the amount of time that you wait before being seen by our physicians.       _____________________________________________________________  Should you have questions after your visit to [facility], please contact our office at [phone] and follow the prompts.  Our office hours are 8:00 a.m. and 4:30 p.m. Monday - Friday.  Please note that voicemails left after 4:00 p.m. may not be returned until the following business day.  We are closed weekends and major holidays.  You do have access to a nurse 24-7, just call the main number to the clinic [phone] and do not press any options, hold on the line and a nurse will answer the phone.    For prescription refill requests, have your pharmacy contact our office and  allow 72 hours.    Due to Covid, you will need to wear a mask upon entering the hospital. If you do not have a mask, a mask will be given to you at the Main Entrance upon arrival. For doctor visits, patients may have 1 support person age 72 or older with them. For treatment visits, patients can not have anyone with them due to social distancing guidelines and our immunocompromised population.

## 2021-11-15 ENCOUNTER — Inpatient Hospital Stay: Payer: Medicare Other

## 2021-11-15 ENCOUNTER — Ambulatory Visit (HOSPITAL_COMMUNITY)
Admission: RE | Admit: 2021-11-15 | Discharge: 2021-11-15 | Disposition: A | Discharge status: Home / Self Care | Payer: Medicare Other | Source: Ambulatory Visit | Attending: Hematology | Admitting: Hematology

## 2021-11-15 DIAGNOSIS — Z881 Allergy status to other antibiotic agents status: Secondary | ICD-10-CM | POA: Diagnosis not present

## 2021-11-15 DIAGNOSIS — E785 Hyperlipidemia, unspecified: Secondary | ICD-10-CM | POA: Diagnosis not present

## 2021-11-15 DIAGNOSIS — Z836 Family history of other diseases of the respiratory system: Secondary | ICD-10-CM | POA: Diagnosis not present

## 2021-11-15 DIAGNOSIS — C50412 Malignant neoplasm of upper-outer quadrant of left female breast: Secondary | ICD-10-CM

## 2021-11-15 DIAGNOSIS — Z79899 Other long term (current) drug therapy: Secondary | ICD-10-CM | POA: Diagnosis not present

## 2021-11-15 DIAGNOSIS — Z833 Family history of diabetes mellitus: Secondary | ICD-10-CM | POA: Diagnosis not present

## 2021-11-15 DIAGNOSIS — I10 Essential (primary) hypertension: Secondary | ICD-10-CM | POA: Diagnosis not present

## 2021-11-15 DIAGNOSIS — E119 Type 2 diabetes mellitus without complications: Secondary | ICD-10-CM | POA: Diagnosis not present

## 2021-11-15 DIAGNOSIS — Z800 Family history of malignant neoplasm of digestive organs: Secondary | ICD-10-CM | POA: Diagnosis not present

## 2021-11-15 DIAGNOSIS — Z808 Family history of malignant neoplasm of other organs or systems: Secondary | ICD-10-CM | POA: Diagnosis not present

## 2021-11-15 DIAGNOSIS — Z87442 Personal history of urinary calculi: Secondary | ICD-10-CM | POA: Diagnosis not present

## 2021-11-15 DIAGNOSIS — Z801 Family history of malignant neoplasm of trachea, bronchus and lung: Secondary | ICD-10-CM | POA: Diagnosis not present

## 2021-11-15 DIAGNOSIS — Z780 Asymptomatic menopausal state: Secondary | ICD-10-CM | POA: Diagnosis not present

## 2021-11-15 DIAGNOSIS — Z170 Estrogen receptor positive status [ER+]: Secondary | ICD-10-CM | POA: Insufficient documentation

## 2021-11-15 DIAGNOSIS — Z9049 Acquired absence of other specified parts of digestive tract: Secondary | ICD-10-CM | POA: Diagnosis not present

## 2021-11-15 LAB — COMPREHENSIVE METABOLIC PANEL
ALT: 20 U/L (ref 0–44)
AST: 27 U/L (ref 15–41)
Albumin: 4.1 g/dL (ref 3.5–5.0)
Alkaline Phosphatase: 70 U/L (ref 38–126)
Anion gap: 7 (ref 5–15)
BUN: 31 mg/dL — ABNORMAL HIGH (ref 8–23)
CO2: 24 mmol/L (ref 22–32)
Calcium: 9.1 mg/dL (ref 8.9–10.3)
Chloride: 108 mmol/L (ref 98–111)
Creatinine, Ser: 1.56 mg/dL — ABNORMAL HIGH (ref 0.44–1.00)
GFR, Estimated: 34 mL/min — ABNORMAL LOW (ref >60)
Glucose, Bld: 118 mg/dL — ABNORMAL HIGH (ref 70–99)
Potassium: 4.1 mmol/L (ref 3.5–5.1)
Sodium: 139 mmol/L (ref 135–145)
Total Bilirubin: 0.5 mg/dL (ref 0.3–1.2)
Total Protein: 7.5 g/dL (ref 6.5–8.1)

## 2021-11-15 LAB — CBC WITH DIFFERENTIAL/PLATELET
Abs Immature Granulocytes: 0.02 10*3/uL (ref 0.00–0.07)
Basophils Absolute: 0.1 10*3/uL (ref 0.0–0.1)
Basophils Relative: 1 %
Eosinophils Absolute: 0.3 10*3/uL (ref 0.0–0.5)
Eosinophils Relative: 4 %
HCT: 31.3 % — ABNORMAL LOW (ref 36.0–46.0)
Hemoglobin: 10.3 g/dL — ABNORMAL LOW (ref 12.0–15.0)
Immature Granulocytes: 0 %
Lymphocytes Relative: 26 %
Lymphs Abs: 1.8 10*3/uL (ref 0.7–4.0)
MCH: 27.8 pg (ref 26.0–34.0)
MCHC: 32.9 g/dL (ref 30.0–36.0)
MCV: 84.6 fL (ref 80.0–100.0)
Monocytes Absolute: 1.1 10*3/uL — ABNORMAL HIGH (ref 0.1–1.0)
Monocytes Relative: 15 %
Neutro Abs: 3.9 10*3/uL (ref 1.7–7.7)
Neutrophils Relative %: 54 %
Platelets: 264 10*3/uL (ref 150–400)
RBC: 3.7 MIL/uL — ABNORMAL LOW (ref 3.87–5.11)
RDW: 15.2 % (ref 11.5–15.5)
WBC: 7.1 10*3/uL (ref 4.0–10.5)
nRBC: 0 % (ref 0.0–0.2)

## 2021-11-15 LAB — VITAMIN D 25 HYDROXY (VIT D DEFICIENCY, FRACTURES): Vit D, 25-Hydroxy: 40.44 ng/mL (ref 30–100)

## 2021-11-15 LAB — MAGNESIUM: Magnesium: 1.7 mg/dL (ref 1.7–2.4)

## 2021-11-21 ENCOUNTER — Other Ambulatory Visit (HOSPITAL_COMMUNITY): Payer: Self-pay | Admitting: General Surgery

## 2021-11-21 ENCOUNTER — Ambulatory Visit (INDEPENDENT_AMBULATORY_CARE_PROVIDER_SITE_OTHER): Payer: Medicare Other | Admitting: General Surgery

## 2021-11-21 ENCOUNTER — Encounter: Payer: Self-pay | Admitting: General Surgery

## 2021-11-21 VITALS — BP 146/81 | HR 68 | Temp 98.1°F | Resp 14 | Ht 66.0 in | Wt 148.0 lb

## 2021-11-21 DIAGNOSIS — C50912 Malignant neoplasm of unspecified site of left female breast: Secondary | ICD-10-CM

## 2021-11-21 DIAGNOSIS — C50412 Malignant neoplasm of upper-outer quadrant of left female breast: Secondary | ICD-10-CM

## 2021-11-21 NOTE — Progress Notes (Signed)
[facility] History and Physical  Reason for Referral:*** Referring Physician: ***  Chief Complaint   Other     Stacy Webb is a 76 y.o. female.  HPI: ***.  The *** started *** and has had a duration of ***.  It is associated with ***.  The *** is improved with ***, and is made worse with ***.    Quality*** Context***  Past Medical History:  Diagnosis Date  . Anemia   . Anxiety   . Arthritis    fingers  . 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 Diabetes mellitus without complication ([place])   . Diabetic foot ulcer ([place]) 09/27/2016  . Excessive hair growth 07/19/2015  . Heart murmur   . History of kidney stones   . Hyperlipidemia   . Hypertension   . Neuromuscular disorder ([place])    diabetic neuropathy  . Neuropathy   . Postmenopausal 07/19/2015    Past Surgical History:  Procedure Laterality Date  . ANTERIOR AND POSTERIOR REPAIR N/A 04/28/2018   Procedure: ANTERIOR (CYSTOCELE) AND POSTERIOR REPAIR (RECTOCELE);  Surgeon: Jonnie Kind, MD;  Location: [facility];  Service: Gynecology;  Laterality: N/A;  . APPENDECTOMY    . CARPAL TUNNEL RELEASE Bilateral 2001  . CESAREAN SECTION    . COLONOSCOPY N/A 08/28/2012   Procedure: COLONOSCOPY;  Surgeon: Rogene Houston, MD;  Location: [facility];  Service: Endoscopy;  Laterality: N/A;  915  . COLONOSCOPY N/A 12/25/2017   Procedure: COLONOSCOPY;  Surgeon: Rogene Houston, MD;  Location: [facility];  Service: Endoscopy;  Laterality: N/A;  12:45  . REVERSE SHOULDER ARTHROPLASTY Left 07/10/2020   Procedure: REVERSE SHOULDER ARTHROPLASTY;  Surgeon: [name], MD;  Location: [facility];  Service: Orthopedics;  Laterality: Left;    Family History  Problem Relation Age of Onset  . Pneumonia Son   . Suicidality Son   . Diabetes Daughter   . Colon cancer Paternal Aunt     Social History   Tobacco Use  . Smoking status: Never  . Smokeless tobacco: Never  Vaping Use  . Vaping Use: Never used   Substance Use Topics  . Alcohol use: No  . Drug use: No    Medications: {medication reviewed/display:3041432} Allergies as of 11/21/2021       Reactions   Clindamycin/lincomycin Other (See Comments)   Mouth taste like metal   Enalapril Other (See Comments), Cough        Medication List        Accurate as of November 21, 2021 11:14 AM. If you have any questions, ask your nurse or doctor.          STOP taking these medications    diltiazem 240 MG 24 hr tablet Commonly known as: Cardizem LA Stopped by: Virl Cagey, MD       TAKE these medications    atorvastatin 20 MG tablet Commonly known as: LIPITOR Take 20 mg by mouth daily.   blood glucose meter kit and supplies Dispense based on patient and insurance preference. Use up to four times daily as directed. (FOR ICD-10 E10.9, E11.9).   cyanocobalamin 500 MCG tablet Commonly known as: VITAMIN B12 Take 500 mcg by mouth 2 (two) times daily.   diltiazem 240 MG 24 hr tablet Commonly known as: CARDIZEM LA Take 1 tablet (240 mg total) by mouth daily.   docusate sodium 100 MG capsule Commonly known as: COLACE Take 200 mg by mouth at bedtime.  glimepiride 1 MG tablet Commonly known as: AMARYL Take 1 tablet (1 mg total) by mouth daily with breakfast.   Gvoke HypoPen 2-Pack 0.5 MG/0.1ML Soaj Generic drug: Glucagon Inject 1 each into the skin as needed. Inject if blood sugar is < 55 mg/dL   hydrochlorothiazide 25 MG tablet Commonly known as: HYDRODIURIL TAKE 1 TABLET BY MOUTH  DAILY   losartan 25 MG tablet Commonly known as: COZAAR Take by mouth.   Prevagen 10 MG Caps Generic drug: Apoaequorin Take by mouth.   ROBITUSSIN 12 HOUR COUGH PO Take by mouth.   VITAMIN D3 COMPLETE PO Take by mouth.   ZINC PO Take 1 Dose by mouth daily.         ROS:  {Review of Systems:30496}  Blood pressure (!) 146/81, pulse 68, temperature 98.1 F (36.7 C), temperature source Oral, resp. rate 14, height 5'  6" (1.676 m), weight 148 lb (67.1 kg), SpO2 94 %. Physical Exam  Results: Mammogram CLINICAL DATA:  Screening.   EXAM: DIGITAL SCREENING BILATERAL MAMMOGRAM WITH TOMOSYNTHESIS AND CAD   TECHNIQUE: Bilateral screening digital craniocaudal and mediolateral oblique mammograms were obtained. Bilateral screening digital breast tomosynthesis was performed. The images were evaluated with computer-aided detection.   COMPARISON:  Previous exam(s).   ACR Breast Density Category c: The breast tissue is heterogeneously dense, which may obscure small masses.   FINDINGS: In the left breast, possible distortion warrants further evaluation. In the right breast, no findings suspicious for malignancy.   IMPRESSION: Further evaluation is suggested for possible distortion in the left breast.   RECOMMENDATION: Diagnostic mammogram and possibly ultrasound of the left breast. (Code:FI-L-44M)   The patient will be contacted regarding the findings, and additional imaging will be scheduled.   BI-RADS CATEGORY  0: Incomplete. Need additional imaging evaluation and/or prior mammograms for comparison.     Electronically Signed   By: Everlean Alstrom M.D.   On: 10/18/2021 07:59  CLINICAL DATA:  Screening recall for left breast distortion.   EXAM: DIGITAL DIAGNOSTIC UNILATERAL LEFT MAMMOGRAM WITH TOMOSYNTHESIS AND CAD; ULTRASOUND LEFT BREAST LIMITED   TECHNIQUE: Left digital diagnostic mammography and breast tomosynthesis was performed. The images were evaluated with computer-aided detection.; Targeted ultrasound examination of the left breast was performed.   COMPARISON:  Previous exam(s).   ACR Breast Density Category c: The breast tissue is heterogeneously dense, which may obscure small masses.   FINDINGS: Spot compression tomograms were performed of the left breast. There is a spiculated mass in the upper-outer left breast measuring approximately 0.5 cm.   Targeted ultrasound of  the left breast was performed. There is a small irregular hypoechoic mass in the left breast at 3 o'clock 4 cm from nipple measuring 0.5 x 0.5 x 0.4 cm. This corresponds well with the mass seen in the upper-outer left breast at mammography. No lymphadenopathy seen in the left axilla.   IMPRESSION: Suspicious 0.5 cm mass in the left breast at the 3 o'clock position.   RECOMMENDATION: Recommend ultrasound-guided core biopsy of the mass in the left breast at the 3 o'clock position.   I have discussed the findings and recommendations with the patient. If applicable, a reminder letter will be sent to the patient regarding the next appointment.   BI-RADS CATEGORY  4: Suspicious.     Electronically Signed   By: Everlean Alstrom M.D.   On: 10/23/2021 13:46   CLINICAL DATA:  Status post left breast ultrasound-guided biopsy.   EXAM: 3D DIAGNOSTIC LEFT MAMMOGRAM POST ULTRASOUND BIOPSY  COMPARISON:  Previous exam(s).   FINDINGS: 3D Mammographic images were obtained following ultrasound guided biopsy of the lateral left breast. The biopsy marking clip is in expected position at the site of biopsy.   IMPRESSION: Appropriate positioning of the ribbon shaped biopsy marking clip at the site of biopsy in the lateral left breast.   Final Assessment: Post Procedure Mammograms for Marker Placement     Electronically Signed   By: Kristopher Oppenheim M.D.   On: 11/06/2021 13:35   ADDENDUM REPORT: 11/07/2021 15:10   ADDENDUM: PATHOLOGY revealed: A. 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   Pathology results are CONCORDANT with imaging findings, per Dr. Kristopher Oppenheim.   Pathology results and recommendations were discussed with patient via telephone on 11/07/2021. Patient reported biopsy site doing well with no adverse symptoms, and only slight tenderness at the site. Post biopsy care instructions were  reviewed, questions were answered and my direct phone number was provided. Patient was instructed to call [facility] Mammography Department for any additional questions or concerns related to biopsy site.   RECOMMENDATION: Surgical consultation. Request for surgical consultation was relayed to Kathi Der RT at [facility] Mammography Department by Electa Sniff RN 11/07/2021.   Pathology results reported by Electa Sniff RN on 11/07/2021.     Electronically Signed   By: Kristopher Oppenheim M.D.   On: 11/07/2021 15:10      CLINICAL DATA:  76 year old female with a suspicious left breast mass.   EXAM: ULTRASOUND GUIDED LEFT BREAST CORE NEEDLE BIOPSY   COMPARISON:  Previous exam(s).   PROCEDURE: I met with the patient and we discussed the procedure of ultrasound-guided biopsy, including benefits and alternatives. We discussed the high likelihood of a successful procedure. We discussed the risks of the procedure, including infection, bleeding, tissue injury, clip migration, and inadequate sampling. Informed written consent was given. The usual time-out protocol was performed immediately prior to the procedure.   Lesion quadrant: Upper outer quadrant   Using sterile technique and 1% Lidocaine as local anesthetic, under direct ultrasound visualization, a 14 gauge spring-loaded device was used to perform biopsy of a mass at the 3 o'clock position of the left breast using a lateral approach. At the conclusion of the procedure a ribbon shaped tissue marker clip was deployed into the biopsy cavity. Follow up 2 view mammogram was performed and dictated separately.   IMPRESSION: Ultrasound guided biopsy of the left breast. No apparent complications.   Electronically Signed: By: Kristopher Oppenheim M.D. On: 11/06/2021 13:26       FINAL MICROSCOPIC DIAGNOSIS:   A.   BREAST, 3:00, MASS, LEFT, BIOPSY:  -    Invasive carcinoma of no special type (ductal), grade  1, longest  involved segment 3 mm   COMMENT:   Ancillary ER, PR, HER2 and Ki-67 will be reported in an addendum.   Dr. Saralyn Pilar has also reviewed the slides (intradepartmental QA review  for malignancy).   Dr. Nicki Reaper was telephoned the diagnosis on 11/07/2021 by Dr. Spero Curb.   Assessment & Plan:  Stacy Webb is a 76 y.o. female with *** -*** -*** -Follow up ***  All questions were answered to the satisfaction of the patient and family***.  The risk and benefits of *** were discussed including but not limited to ***.  After careful consideration, WHITLEIGH GARRAMONE has decided to ***.    Lanell Matar  Beatris Belen 11/21/2021, 11:14 AM

## 2021-11-21 NOTE — Patient Instructions (Signed)
Surgical Options for Early-Stage Breast Cancer Surgery is usually the first treatment for early-stage breast cancer. There are two surgery options that result in good rates of cancer survival. They include: Partial mastectomy. Mastectomy. Breast cancer is different for everyone, even in its early stage. The best treatment for one person might not be the best treatment for another. Learn as much as you can about your cancer, and work closely with your health care providers to make the choice that produces the best results for you. What is partial mastectomy? Partial mastectomy, also called breast-sparing surgery or breast-conserving surgery, is surgery to remove the cancer along with some normal breast tissue that surrounds it. Lymph nodes from under the arm may also be removed and tested to find out if the cancer has spread. If cancer is located near the chest wall, part of the chest wall lining may also be removed. What is a mastectomy? A mastectomy is surgery to remove the cancer along with the entire breast tissue. There are several types of mastectomy: Simple or total mastectomy. In this surgery, the entire breast is removed, including breast tissue, nipple, areola, and skin around the breast. Some lymph nodes may also be removed from under the arm. If cancer is located near the chest wall, part of the chest wall lining may also be removed. Skin-sparing mastectomy. In this surgery, the breast tissue, nipple, and areola are removed, and most of the skin over the breast is left in place. This surgery results in less scar tissue than other mastectomy surgeries, which allows for a more natural breast reconstruction. Nipple-sparing mastectomy. In this surgery, breast tissue is removed, but the skin and nipple are left in place. The tissue under the nipple and areola may be removed if cancer is found in the area. This may be an option for those who choose to have breast reconstruction after  mastectomy. Modified radical mastectomy. This surgery is the same as a simple mastectomy but also includes removing lymph nodes from under the arm (axillary lymph node dissection). Radical mastectomy. In this surgery, the entire breast, the lymph nodes under the arm, and the chest wall muscles under the breast are removed. This surgery is rarely done now. A modified radical mastectomy is preferred because it is just as effective but with the added advantage of fewer complications. What are some advantages and disadvantages of these surgeries? Partial mastectomy Advantages Keeping most of your breast tissue intact, allowing for a more natural look to the breast. Easier recovery when compared to a mastectomy. Ability to go home on the day of the procedure. Disadvantages Slightly higher risk that your cancer will come back. Needing more surgery at a later time. Requiring radiation therapy after surgery, which has side effects and possible complications. This is done to reduce the chances of breast cancer returning. Mastectomy Advantages Not needing to have radiation therapy or other treatments after surgery. Lower chances of your cancer coming back. Disadvantages Longer recovery time compared to partial mastectomy. Possibility of more complications. Requiring additional surgeries to reconstruct your breast. Questions to ask Here are some questions to ask about each surgery: What will my recovery be like? How will my breast look and feel? What are the possible risks and complications of the surgery? What additional treatment might I need after surgery? What are the risks and complications of radiation therapy? What are the risks and complications of chemotherapy? Will I be able to have breast reconstruction? Where to find more information [place]: cancer.gov  American Cancer Society: cancer.org Summary Surgery is usually the first treatment for early-stage breast  cancer. Two surgery options are available. One surgery option is called partial mastectomy and the other is called mastectomy. Both result in good rates of cancer survival. Each option has advantages and disadvantages to consider. The best treatment for one person might not be the best treatment for you. Learn as much as you can about your cancer, and work closely with your health care providers to make the choice that produces the best results for you. This information is not intended to replace advice given to you by your health care provider. Make sure you discuss any questions you have with your health care provider. Document Revised: 03/07/2021 Document Reviewed: 03/07/2021 Elsevier Patient Education  [place].

## 2021-11-22 NOTE — H&P (Addendum)
[facility] History and Physical  Reason for Referral: Left breast cancer  Referring Physician: Coral Spikes, DO   Chief Complaint   Other     Stacy Webb is a 76 y.o. female.  HPI: Stacy Webb is a 76 yo with a newly diagnosed left breast cancer. She had her normal mammogram and then was called back for a diagnostic and biopsy. She had a biopsy in the 3 o'clock position and was found to have a grade 1 invasive cancer. She has no family history of any breast cancer.   The patient has no history of any masses, lumps, bumps, nipple changes or discharge.  She has never had any previous biopsies or concerning areas on mammogram.  She has not had any chest radiation.  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[place])    Diabetes mellitus without complication ([place])    Diabetic foot ulcer ([place]) 09/27/2016   Excessive hair growth 07/19/2015   Heart murmur    History of kidney stones    Hyperlipidemia    Hypertension    Neuromuscular disorder ([place])    diabetic neuropathy   Neuropathy    Postmenopausal 07/19/2015    Past Surgical History:  Procedure Laterality Date   ANTERIOR AND POSTERIOR REPAIR N/A 04/28/2018   Procedure: ANTERIOR (CYSTOCELE) AND POSTERIOR REPAIR (RECTOCELE);  Surgeon: Stacy Kind, MD;  Location: [facility] ORS;  Service: Gynecology;  Laterality: N/A;   APPENDECTOMY     CARPAL TUNNEL RELEASE Bilateral 2001   CESAREAN SECTION     COLONOSCOPY N/A 08/28/2012   Procedure: COLONOSCOPY;  Surgeon: Stacy Houston, MD;  Location: [facility] ENDO SUITE;  Service: Endoscopy;  Laterality: N/A;  915   COLONOSCOPY N/A 12/25/2017   Procedure: COLONOSCOPY;  Surgeon: Stacy Houston, MD;  Location: [facility];  Service: Endoscopy;  Laterality: N/A;  12:45   REVERSE SHOULDER ARTHROPLASTY Left 07/10/2020   Procedure: REVERSE SHOULDER ARTHROPLASTY;  Surgeon: Stacy Rasmussen, MD;  Location: [facility]  ORS;  Service: Orthopedics;  Laterality: Left;    Family History  Problem Relation Age of Onset   Pneumonia Son    Suicidality Son    Diabetes Daughter    Colon cancer Paternal Aunt     Social History   Tobacco Use   Smoking status: Never   Smokeless tobacco: Never  Vaping Use   Vaping Use: Never used  Substance Use Topics   Alcohol use: No   Drug use: No    Medications: I have reviewed the patient's current medications. Allergies as of 11/21/2021       Reactions   Clindamycin/lincomycin Other (See Comments)   Mouth taste like metal   Enalapril Other (See Comments), Cough        Medication List        Accurate as of November 21, 2021 11:14 AM. If you have any questions, ask your nurse or doctor.          STOP taking these medications    diltiazem 240 MG 24 hr tablet Commonly known as: Cardizem LA Stopped by: Stacy Cagey, MD       TAKE these medications    atorvastatin 20 MG tablet Commonly known as: LIPITOR Take 20 mg by mouth daily.   blood glucose meter kit and supplies Dispense based on patient and insurance preference. Use up to four  times daily as directed. (FOR ICD-10 E10.9, E11.9).   cyanocobalamin 500 MCG tablet Commonly known as: VITAMIN B12 Take 500 mcg by mouth 2 (two) times daily.   diltiazem 240 MG 24 hr tablet Commonly known as: CARDIZEM LA Take 1 tablet (240 mg total) by mouth daily.   docusate sodium 100 MG capsule Commonly known as: COLACE Take 200 mg by mouth at bedtime.   glimepiride 1 MG tablet Commonly known as: AMARYL Take 1 tablet (1 mg total) by mouth daily with breakfast.   Gvoke HypoPen 2-Pack 0.5 MG/0.1ML So[place] Generic drug: Glucagon Inject 1 each into the skin as needed. Inject if blood sugar is < 55 mg/dL   hydrochlorothiazide 25 MG tablet Commonly known as: HYDRODIURIL TAKE 1 TABLET BY MOUTH  DAILY   losartan 25 MG tablet Commonly known as: COZAAR Take by mouth.   Prevagen 10 MG Caps Generic  drug: Apoaequorin Take by mouth.   ROBITUSSIN 12 HOUR COUGH PO Take by mouth.   VITAMIN D3 COMPLETE PO Take by mouth.   ZINC PO Take 1 Dose by mouth daily.         ROS:  A comprehensive review of systems was negative except for: Integument/breast: positive for left breast cancer, tenderness from biopsy  Blood pressure (!) 146/81, pulse 68, temperature 98.1 F (36.7 C), temperature source Oral, resp. rate 14, height '5\' 6"'  (1.676 m), weight 148 lb (67.1 kg), SpO2 94 %. Physical Exam Vitals reviewed.  Constitutional:      Appearance: Normal appearance.  HENT:     Head: Normocephalic.     Nose: Nose normal.     Mouth/Throat:     Mouth: Mucous membranes are moist.  Eyes:     Extraocular Movements: Extraocular movements intact.  Cardiovascular:     Rate and Rhythm: Normal rate and regular rhythm.  Pulmonary:     Effort: Pulmonary effort is normal.     Breath sounds: Normal breath sounds.  Chest:  Breasts:    Right: No inverted nipple, mass, nipple discharge, skin change or tenderness.     Left: Tenderness present. No inverted nipple, mass, nipple discharge or skin change.     Comments: Left lateral breast area with steri strips from biopsy. Tender, no mass  Abdominal:     General: There is no distension.     Palpations: Abdomen is soft.     Tenderness: There is no abdominal tenderness.  Musculoskeletal:     Cervical back: Normal range of motion.  Skin:    General: Skin is warm.  Neurological:     Mental Status: She is alert.  Psychiatric:        Mood and Affect: Mood normal.        Thought Content: Thought content normal.        Judgment: Judgment normal.     Results: Mammogram CLINICAL DATA:  Screening.   EXAM: DIGITAL SCREENING BILATERAL MAMMOGRAM WITH TOMOSYNTHESIS AND CAD   TECHNIQUE: Bilateral screening digital craniocaudal and mediolateral oblique mammograms were obtained. Bilateral screening digital breast tomosynthesis was performed. The images  were evaluated with computer-aided detection.   COMPARISON:  Previous exam(s).   ACR Breast Density Category c: The breast tissue is heterogeneously dense, which may obscure small masses.   FINDINGS: In the left breast, possible distortion warrants further evaluation. In the right breast, no findings suspicious for malignancy.   IMPRESSION: Further evaluation is suggested for possible distortion in the left breast.   RECOMMENDATION: Diagnostic mammogram and possibly ultrasound of  the left breast. (Code:[id number]-L-39M)   The patient will be contacted regarding the findings, and additional imaging will be scheduled.   BI-RADS CATEGORY  0: Incomplete. Need additional imaging evaluation and/or prior mammograms for comparison.     Electronically Signed   By: Everlean Alstrom M.D.   On: 10/18/2021 07:59  CLINICAL DATA:  Screening recall for left breast distortion.   EXAM: DIGITAL DIAGNOSTIC UNILATERAL LEFT MAMMOGRAM WITH TOMOSYNTHESIS AND CAD; ULTRASOUND LEFT BREAST LIMITED   TECHNIQUE: Left digital diagnostic mammography and breast tomosynthesis was performed. The images were evaluated with computer-aided detection.; Targeted ultrasound examination of the left breast was performed.   COMPARISON:  Previous exam(s).   ACR Breast Density Category c: The breast tissue is heterogeneously dense, which may obscure small masses.   FINDINGS: Spot compression tomograms were performed of the left breast. There is a spiculated mass in the upper-outer left breast measuring approximately 0.5 cm.   Targeted ultrasound of the left breast was performed. There is a small irregular hypoechoic mass in the left breast at 3 o'clock 4 cm from nipple measuring 0.5 x 0.5 x 0.4 cm. This corresponds well with the mass seen in the upper-outer left breast at mammography. No lymphadenopathy seen in the left axilla.   IMPRESSION: Suspicious 0.5 cm mass in the left breast at the 3 o'clock position.    RECOMMENDATION: Recommend ultrasound-guided core biopsy of the mass in the left breast at the 3 o'clock position.   I have discussed the findings and recommendations with the patient. If applicable, a reminder letter will be sent to the patient regarding the next appointment.   BI-RADS CATEGORY  4: Suspicious.     Electronically Signed   By: Everlean Alstrom M.D.   On: 10/23/2021 13:46   CLINICAL DATA:  Status post left breast ultrasound-guided biopsy.   EXAM: 3D DIAGNOSTIC LEFT MAMMOGRAM POST ULTRASOUND BIOPSY   COMPARISON:  Previous exam(s).   FINDINGS: 3D Mammographic images were obtained following ultrasound guided biopsy of the lateral left breast. The biopsy marking clip is in expected position at the site of biopsy.   IMPRESSION: Appropriate positioning of the ribbon shaped biopsy marking clip at the site of biopsy in the lateral left breast.   Final Assessment: Post Procedure Mammograms for Marker Placement     Electronically Signed   By: Kristopher Oppenheim M.D.   On: 11/06/2021 13:35   ADDENDUM REPORT: 11/07/2021 15:10   ADDENDUM: PATHOLOGY revealed: A. 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   Pathology results are CONCORDANT with imaging findings, per Dr. Kristopher Oppenheim.   Pathology results and recommendations were discussed with patient via telephone on 11/07/2021. Patient reported biopsy site doing well with no adverse symptoms, and only slight tenderness at the site. Post biopsy care instructions were reviewed, questions were answered and my direct phone number was provided. Patient was instructed to call [facility] Mammography Department for any additional questions or concerns related to biopsy site.   RECOMMENDATION: Surgical consultation. Request for surgical consultation was relayed to Kathi Der RT at [facility] Mammography  Department by Electa Sniff RN 11/07/2021.   Pathology results reported by Electa Sniff RN on 11/07/2021.     Electronically Signed   By: Kristopher Oppenheim M.D.   On: 11/07/2021 15:10      CLINICAL DATA:  76 year old female with a suspicious left breast mass.   EXAM: ULTRASOUND  GUIDED LEFT BREAST CORE NEEDLE BIOPSY   COMPARISON:  Previous exam(s).   PROCEDURE: I met with the patient and we discussed the procedure of ultrasound-guided biopsy, including benefits and alternatives. We discussed the high likelihood of a successful procedure. We discussed the risks of the procedure, including infection, bleeding, tissue injury, clip migration, and inadequate sampling. Informed written consent was given. The usual time-out protocol was performed immediately prior to the procedure.   Lesion quadrant: Upper outer quadrant   Using sterile technique and 1% Lidocaine as local anesthetic, under direct ultrasound visualization, a 14 gauge spring-loaded device was used to perform biopsy of a mass at the 3 o'clock position of the left breast using a lateral approach. At the conclusion of the procedure a ribbon shaped tissue marker clip was deployed into the biopsy cavity. Follow up 2 view mammogram was performed and dictated separately.   IMPRESSION: Ultrasound guided biopsy of the left breast. No apparent complications.   Electronically Signed: By: Kristopher Oppenheim M.D. On: 11/06/2021 13:26       FINAL MICROSCOPIC DIAGNOSIS:   A.   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   Dr. Saralyn Pilar has also reviewed the slides (intradepartmental QA review  for malignancy).   Dr. Nicki Reaper was telephoned the diagnosis on 11/07/2021 by Dr. Spero Curb.   Assessment & Plan:  VANNAH NADAL is a 76 y.o. female with a low grade ER positive breast  cancer on the left 3 o'clock position.   We have discussed the options for surgery including the option of mastectomy with sentinel node biopsy versus partial mastectomy (lumpectomy). We have discussed that there is no difference in the prognosis or chance or recurrence or differences in survival between the two options. We have discussed the need for radiation with the lumpectomy, and we have discussed that she will be referred to oncology after our procedure to further discuss her options for chemotherapy and hormonal therapy if she qualifies.   We have discussed that if she decides to have a lumpectomy that we will need to get a needle placed into the area where the biopsy was performed, since we cannot palpate a mass.   We have discussed that if the lumpectomy does not remove the entire cancer that she may have to have an additional procedure.    We have discussed that these are big discussions, and that the risk from the operations are similar including risk of bleeding, risk of infection, and risk of needing additional surgeries. We have discussed the likely need for an overnight stay with a mastectomy and a drain that will remain in place for about 1 week.    Discussed given her age and her ER+ cancer that is low grade she should be a candidate to forego sentinel node biopsy. Discussed this with Dr. Delton Coombes who agrees.   All questions were answered to the satisfaction of the patient and family.  Discussed radiofrequency tag placement and lumpectomy and she would like to proceed.    Stacy Webb 11/21/2021, 11:14 AM

## 2021-11-26 ENCOUNTER — Ambulatory Visit (INDEPENDENT_AMBULATORY_CARE_PROVIDER_SITE_OTHER): Payer: Medicare Other | Admitting: Family Medicine

## 2021-11-26 ENCOUNTER — Encounter: Payer: Self-pay | Admitting: Family Medicine

## 2021-11-26 VITALS — BP 206/83 | HR 75 | Temp 97.7°F | Wt 146.8 lb

## 2021-11-26 DIAGNOSIS — N1832 Chronic kidney disease, stage 3b: Secondary | ICD-10-CM | POA: Diagnosis not present

## 2021-11-26 DIAGNOSIS — E785 Hyperlipidemia, unspecified: Secondary | ICD-10-CM | POA: Diagnosis not present

## 2021-11-26 DIAGNOSIS — E11610 Type 2 diabetes mellitus with diabetic neuropathic arthropathy: Secondary | ICD-10-CM | POA: Insufficient documentation

## 2021-11-26 DIAGNOSIS — E1122 Type 2 diabetes mellitus with diabetic chronic kidney disease: Secondary | ICD-10-CM

## 2021-11-26 DIAGNOSIS — I10 Essential (primary) hypertension: Secondary | ICD-10-CM | POA: Diagnosis not present

## 2021-11-26 DIAGNOSIS — E1129 Type 2 diabetes mellitus with other diabetic kidney complication: Secondary | ICD-10-CM | POA: Insufficient documentation

## 2021-11-26 MED ORDER — ATORVASTATIN CALCIUM 20 MG PO TABS: ORAL_TABLET | ORAL | 0 refills | Status: DC

## 2021-11-26 NOTE — Patient Instructions (Signed)
Labs today.  Best of luck next week.  Check your BP at home (get yourself a BP cuff).  Follow up in 3 months.

## 2021-11-26 NOTE — Assessment & Plan Note (Addendum)
A1c has been at goal.  Needs updated A1c. No hypoglycemia.  Continue Amaryl.

## 2021-11-26 NOTE — Assessment & Plan Note (Signed)
Stable.  Continue Lipitor.  Refill today.

## 2021-11-26 NOTE — Progress Notes (Signed)
Subjective:  Patient ID: Stacy Webb, female    DOB: 1945/08/11  Age: 77 y.o. MRN: [id number]  CC: Chief Complaint  Patient presents with   Diabetes    Pt recently diagnosed with breast cancer. Pt having surgery on left breast on 12/03/21. Pt requesting permanent handicap place card.    HPI:  76 year old female with Type 2 Diabetes with complications (CKD, Charcot foot), hypertension, recurrent breast cancer with upcoming surgery presents for follow-up.  Patient is very tearful today.  She has upcoming mastectomy scheduled for next week.  She has recently been diagnosed with breast cancer.  Patient's diabetes is well controlled.  Last A1c was 6.2.  Needs updated A1c.  She follows closely with nephrology as she has chronic kidney disease from type 2 diabetes.  Patient also suffers from Charcot foot.  Patient very hypertensive today.  BP has been very well controlled.  I suspect that anxiety is playing a significant role.  She is currently on Cardizem, HCTZ, and losartan.  Needs to check BPs at home.  This will need to be closely monitored during and following breast surgery.  Patient is on atorvastatin regarding hyperlipidemia.  Has been well controlled.  Needs refill.   Patient Active Problem List   Diagnosis Date Noted   Type 2 diabetes with kidney complications ([place]) 62/37/6283   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[place])    Breast cancer of upper-outer quadrant of left female breast ([place]) 11/12/2021   Cholelithiasis 10/29/2020   Rotator cuff arthropathy of left shoulder 07/10/2020   Cystocele with rectocele 06/10/2017   CKD (chronic kidney disease) stage 3, GFR 30-59 ml/min ([place]) 09/27/2016   Hyperlipidemia 09/27/2016   Essential hypertension, benign 08/11/2012    Social Hx   Social History   Socioeconomic History   Marital status: Widowed    Spouse name: Not on file   Number of children: 3   Years of education: Not on file   Highest education level: Not on file   Occupational History   Occupation: [employer]    Comment: Medical Records  Tobacco Use   Smoking status: Never   Smokeless tobacco: Never  Vaping Use   Vaping Use: Never used  Substance and Sexual Activity   Alcohol use: No   Drug use: No   Sexual activity: Not Currently    Birth control/protection: Post-menopausal  Other Topics Concern   Not on file  Social History Narrative   Husband had ALS and passed away.Twin boys, but one died at 49 months and her other son died in 06/25/2014.      Living daughter- lives with pt.   Social Determinants of Health   Financial Resource Strain: Low Risk  (10/02/2021)   Overall Financial Resource Strain (CARDIA)    Difficulty of Paying Living Expenses: Not hard at all  Food Insecurity: No Food Insecurity (10/02/2021)   Hunger Vital Sign    Worried About Running Out of Food in the Last Year: Never true    Ran Out of Food in the Last Year: Never true  Transportation Needs: No Transportation Needs (10/02/2021)   PRAPARE - Hydrologist (Medical): No    Lack of Transportation (Non-Medical): No  Physical Activity: Insufficiently Active (10/02/2021)   Exercise Vital Sign    Days of Exercise per Week: 2 days    Minutes of Exercise per Session: 60 min  Stress: No Stress Concern Present (10/02/2021)   [place] -  Occupational Stress Questionnaire    Feeling of Stress : Not at all  Social Connections: Socially Integrated (10/02/2021)   Social Connection and Isolation Panel [NHANES]    Frequency of Communication with Friends and Family: More than three times a week    Frequency of Social Gatherings with Friends and Family: More than three times a week    Attends Religious Services: More than 4 times per year    Active Member of [employer] or Organizations: Yes    Attends Music therapist: More than 4 times per year    Marital Status: Married    Review of Systems  Respiratory: Negative.     Cardiovascular: Negative.   Psychiatric/Behavioral:  The patient is nervous/anxious.    Objective:  BP (!) 206/83   Pulse 75   Temp 97.7 F (36.5 C)   Wt 146 lb 12.8 oz (66.6 kg)   SpO2 98%   BMI 23.69 kg/m      11/26/2021   10:30 AM 11/21/2021   11:06 AM 11/12/2021    8:17 AM  BP/Weight  Systolic BP 423 536 144  Diastolic BP 83 81 71  Wt. (Lbs) 146.8 148 145.4  BMI 23.69 kg/m2 23.89 kg/m2 23.47 kg/m2    Physical Exam Vitals and nursing note reviewed.  Constitutional:      General: She is not in acute distress. HENT:     Head: Normocephalic and atraumatic.  Eyes:     General:        Right eye: No discharge.        Left eye: No discharge.     Conjunctiva/sclera: Conjunctivae normal.  Cardiovascular:     Rate and Rhythm: Normal rate and regular rhythm.  Pulmonary:     Effort: Pulmonary effort is normal.     Breath sounds: Normal breath sounds. No wheezing, rhonchi or rales.  Neurological:     Mental Status: She is alert.  Psychiatric:     Comments: Tearful. Upset.     Lab Results  Component Value Date   WBC 7.1 11/15/2021   HGB 10.3 (L) 11/15/2021   HCT 31.3 (L) 11/15/2021   PLT 264 11/15/2021   GLUCOSE 118 (H) 11/15/2021   CHOL 146 03/02/2021   TRIG 79 03/02/2021   HDL 58 03/02/2021   LDLCALC 73 03/02/2021   ALT 20 11/15/2021   AST 27 11/15/2021   NA 139 11/15/2021   K 4.1 11/15/2021   CL 108 11/15/2021   CREATININE 1.56 (H) 11/15/2021   BUN 31 (H) 11/15/2021   CO2 24 11/15/2021   HGBA1C 6.2 (H) 03/02/2021     Assessment & Plan:   Problem List Items Addressed This Visit       Cardiovascular and Mediastinum   Essential hypertension, benign - Primary    I suspect that her elevated blood pressure secondary to anxiety.  Needs to check blood pressures at home.  Continue current medications for now.  If BP remains elevated will need additional pharmacotherapy.      Relevant Medications   atorvastatin (LIPITOR) 20 MG tablet     Endocrine    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[place])   Relevant Medications   atorvastatin (LIPITOR) 20 MG tablet   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)    R1V has been at goal.  Needs updated A1c. No hypoglycemia.  Continue Amaryl.      Relevant Medications   atorvastatin (LIPITOR) 20 MG tablet   Other Relevant Orders   Hemoglobin  A1c     Other   Hyperlipidemia    Stable.  Continue Lipitor.  Refill today.      Relevant Medications   atorvastatin (LIPITOR) 20 MG tablet   Other Relevant Orders   Lipid panel    Meds ordered this encounter  Medications   atorvastatin (LIPITOR) 20 MG tablet    Sig: Take 20 mg by mouth daily.    Dispense:  14 tablet    Refill:  0    Follow-up:  Return in about 3 months (around 02/26/2022).  [place]

## 2021-11-26 NOTE — Assessment & Plan Note (Signed)
I suspect that her elevated blood pressure secondary to anxiety.  Needs to check blood pressures at home.  Continue current medications for now.  If BP remains elevated will need additional pharmacotherapy.

## 2021-11-27 ENCOUNTER — Ambulatory Visit (HOSPITAL_COMMUNITY)
Admission: RE | Admit: 2021-11-27 | Discharge: 2021-11-27 | Disposition: A | Discharge status: Home / Self Care | Payer: Medicare Other | Source: Ambulatory Visit | Attending: General Surgery | Admitting: General Surgery

## 2021-11-27 ENCOUNTER — Other Ambulatory Visit (HOSPITAL_COMMUNITY): Payer: Self-pay | Admitting: General Surgery

## 2021-11-27 DIAGNOSIS — C50812 Malignant neoplasm of overlapping sites of left female breast: Secondary | ICD-10-CM | POA: Diagnosis not present

## 2021-11-27 DIAGNOSIS — C50912 Malignant neoplasm of unspecified site of left female breast: Secondary | ICD-10-CM | POA: Diagnosis not present

## 2021-11-27 DIAGNOSIS — N6325 Unspecified lump in the left breast, overlapping quadrants: Secondary | ICD-10-CM | POA: Diagnosis not present

## 2021-11-27 MED ORDER — LIDOCAINE HCL (PF) 2 % IJ SOLN
INTRAMUSCULAR | Status: AC
  Filled 2021-11-27: qty 10

## 2021-11-29 ENCOUNTER — Encounter (HOSPITAL_COMMUNITY)
Admission: RE | Admit: 2021-11-29 | Discharge: 2021-11-29 | Disposition: A | Discharge status: Home / Self Care | Payer: Medicare Other | Source: Ambulatory Visit | Attending: General Surgery | Admitting: General Surgery

## 2021-12-03 ENCOUNTER — Ambulatory Visit (HOSPITAL_BASED_OUTPATIENT_CLINIC_OR_DEPARTMENT_OTHER): Payer: Medicare Other | Admitting: Anesthesiology

## 2021-12-03 ENCOUNTER — Encounter (HOSPITAL_COMMUNITY): Payer: Self-pay | Admitting: General Surgery

## 2021-12-03 ENCOUNTER — Ambulatory Visit (HOSPITAL_COMMUNITY)
Admission: RE | Admit: 2021-12-03 | Discharge: 2021-12-03 | Disposition: A | Discharge status: Home / Self Care | Payer: Medicare Other | Attending: General Surgery | Admitting: General Surgery

## 2021-12-03 ENCOUNTER — Ambulatory Visit (HOSPITAL_COMMUNITY): Payer: Medicare Other | Admitting: Anesthesiology

## 2021-12-03 ENCOUNTER — Encounter (HOSPITAL_COMMUNITY)
Admission: RE | Disposition: A | Discharge status: Home / Self Care | Payer: Self-pay | Source: Home / Self Care | Attending: General Surgery

## 2021-12-03 ENCOUNTER — Other Ambulatory Visit: Payer: Self-pay

## 2021-12-03 ENCOUNTER — Ambulatory Visit (HOSPITAL_COMMUNITY): Payer: Medicare Other

## 2021-12-03 DIAGNOSIS — E1122 Type 2 diabetes mellitus with diabetic chronic kidney disease: Secondary | ICD-10-CM | POA: Diagnosis not present

## 2021-12-03 DIAGNOSIS — C50812 Malignant neoplasm of overlapping sites of left female breast: Secondary | ICD-10-CM | POA: Insufficient documentation

## 2021-12-03 DIAGNOSIS — I129 Hypertensive chronic kidney disease with stage 1 through stage 4 chronic kidney disease, or unspecified chronic kidney disease: Secondary | ICD-10-CM | POA: Insufficient documentation

## 2021-12-03 DIAGNOSIS — C50412 Malignant neoplasm of upper-outer quadrant of left female breast: Secondary | ICD-10-CM | POA: Diagnosis present

## 2021-12-03 DIAGNOSIS — N6489 Other specified disorders of breast: Secondary | ICD-10-CM | POA: Diagnosis not present

## 2021-12-03 DIAGNOSIS — C50912 Malignant neoplasm of unspecified site of left female breast: Secondary | ICD-10-CM | POA: Diagnosis not present

## 2021-12-03 DIAGNOSIS — N189 Chronic kidney disease, unspecified: Secondary | ICD-10-CM | POA: Diagnosis not present

## 2021-12-03 DIAGNOSIS — R928 Other abnormal and inconclusive findings on diagnostic imaging of breast: Secondary | ICD-10-CM

## 2021-12-03 DIAGNOSIS — Z170 Estrogen receptor positive status [ER+]: Secondary | ICD-10-CM | POA: Diagnosis not present

## 2021-12-03 LAB — GLUCOSE, CAPILLARY
Glucose-Capillary: 99 mg/dL (ref 70–99)
Glucose-Capillary: 100 mg/dL — ABNORMAL HIGH (ref 70–99)

## 2021-12-03 SURGERY — PARTIAL MASTECTOMY WITH RADIO FREQUENCY LOCALIZER
Anesthesia: General | Site: Breast | Laterality: Left

## 2021-12-03 MED ORDER — PROPOFOL 10 MG/ML IV BOLUS
INTRAVENOUS | Status: DC | PRN
  Administered 2021-12-03: 170 mg via INTRAVENOUS

## 2021-12-03 MED ORDER — CHLORHEXIDINE GLUCONATE CLOTH 2 % EX PADS: 6.0000 | MEDICATED_PAD | Freq: Once | CUTANEOUS | Status: DC

## 2021-12-03 MED ORDER — FENTANYL CITRATE PF 50 MCG/ML IJ SOSY: 25.0000 ug | PREFILLED_SYRINGE | INTRAMUSCULAR | Status: DC | PRN

## 2021-12-03 MED ORDER — CEFAZOLIN SODIUM-DEXTROSE 2-4 GM/100ML-% IV SOLN
2.0000 g | INTRAVENOUS | Status: AC
  Administered 2021-12-03: 2 g via INTRAVENOUS
  Filled 2021-12-03: qty 100

## 2021-12-03 MED ORDER — ORAL CARE MOUTH RINSE: 15.0000 mL | Freq: Once | OROMUCOSAL | Status: AC

## 2021-12-03 MED ORDER — PHENYLEPHRINE HCL-NACL 20-0.9 MG/250ML-% IV SOLN
INTRAVENOUS | Status: AC
  Filled 2021-12-03: qty 250

## 2021-12-03 MED ORDER — ONDANSETRON HCL 4 MG/2ML IJ SOLN: 4.0000 mg | Freq: Once | INTRAMUSCULAR | Status: DC | PRN

## 2021-12-03 MED ORDER — LIDOCAINE HCL (CARDIAC) PF 100 MG/5ML IV SOSY
PREFILLED_SYRINGE | INTRAVENOUS | Status: DC | PRN
  Administered 2021-12-03: 60 mg via INTRAVENOUS

## 2021-12-03 MED ORDER — LACTATED RINGERS IV SOLN
INTRAVENOUS | Status: DC
  Administered 2021-12-03: 1000 mL via INTRAVENOUS

## 2021-12-03 MED ORDER — FENTANYL CITRATE (PF) 100 MCG/2ML IJ SOLN
INTRAMUSCULAR | Status: DC | PRN
  Administered 2021-12-03: 25 ug via INTRAVENOUS
  Administered 2021-12-03 (×2): 50 ug via INTRAVENOUS

## 2021-12-03 MED ORDER — OXYCODONE HCL 5 MG PO TABS: 5.0000 mg | ORAL_TABLET | ORAL | 0 refills | Status: DC | PRN

## 2021-12-03 MED ORDER — OXYCODONE HCL 5 MG PO TABS: 5.0000 mg | ORAL_TABLET | Freq: Once | ORAL | Status: DC | PRN

## 2021-12-03 MED ORDER — ONDANSETRON HCL 4 MG/2ML IJ SOLN
INTRAMUSCULAR | Status: AC
  Filled 2021-12-03: qty 2

## 2021-12-03 MED ORDER — PROPOFOL 10 MG/ML IV BOLUS
INTRAVENOUS | Status: AC
  Filled 2021-12-03: qty 20

## 2021-12-03 MED ORDER — PHENYLEPHRINE HCL-NACL 20-0.9 MG/250ML-% IV SOLN
INTRAVENOUS | Status: DC | PRN
  Administered 2021-12-03: 30 ug/min via INTRAVENOUS

## 2021-12-03 MED ORDER — GLYCOPYRROLATE PF 0.2 MG/ML IJ SOSY
PREFILLED_SYRINGE | INTRAMUSCULAR | Status: DC | PRN
  Administered 2021-12-03: .2 mg via INTRAVENOUS

## 2021-12-03 MED ORDER — DEXAMETHASONE SODIUM PHOSPHATE 10 MG/ML IJ SOLN
INTRAMUSCULAR | Status: DC | PRN
  Administered 2021-12-03: 10 mg via INTRAVENOUS

## 2021-12-03 MED ORDER — CHLORHEXIDINE GLUCONATE 0.12 % MT SOLN
15.0000 mL | Freq: Once | OROMUCOSAL | Status: AC
  Administered 2021-12-03: 15 mL via OROMUCOSAL

## 2021-12-03 MED ORDER — FENTANYL CITRATE (PF) 250 MCG/5ML IJ SOLN
INTRAMUSCULAR | Status: AC
  Filled 2021-12-03: qty 5

## 2021-12-03 MED ORDER — BUPIVACAINE HCL (PF) 0.5 % IJ SOLN
INTRAMUSCULAR | Status: DC | PRN
  Administered 2021-12-03: 20 mL

## 2021-12-03 MED ORDER — DEXAMETHASONE SODIUM PHOSPHATE 10 MG/ML IJ SOLN
INTRAMUSCULAR | Status: AC
  Filled 2021-12-03: qty 1

## 2021-12-03 MED ORDER — ONDANSETRON HCL 4 MG PO TABS: 4.0000 mg | ORAL_TABLET | Freq: Three times a day (TID) | ORAL | 1 refills | Status: DC | PRN

## 2021-12-03 MED ORDER — OXYCODONE HCL 5 MG/5ML PO SOLN: 5.0000 mg | Freq: Once | ORAL | Status: DC | PRN

## 2021-12-03 MED ORDER — BUPIVACAINE HCL (PF) 0.5 % IJ SOLN
INTRAMUSCULAR | Status: AC
  Filled 2021-12-03: qty 30

## 2021-12-03 MED ORDER — 0.9 % SODIUM CHLORIDE (POUR BTL) OPTIME
TOPICAL | Status: DC | PRN
  Administered 2021-12-03: 1000 mL

## 2021-12-03 MED ORDER — ONDANSETRON HCL 4 MG/2ML IJ SOLN
INTRAMUSCULAR | Status: DC | PRN
  Administered 2021-12-03: 4 mg via INTRAVENOUS

## 2021-12-03 SURGICAL SUPPLY — 36 items
ADH SKN CLS APL DERMABOND .7 (GAUZE/BANDAGES/DRESSINGS) ×1
APL PRP STRL LF DISP 70% ISPRP (MISCELLANEOUS) ×1
APPLIER CLIP 9.375 SM OPEN (CLIP) ×1
APR CLP SM 9.3 20 MLT OPN (CLIP) ×1
BLADE SURG 15 STRL LF DISP TIS (BLADE) IMPLANT
BLADE SURG 15 STRL SS (BLADE) ×1
CHLORAPREP W/TINT 26 (MISCELLANEOUS) ×1 IMPLANT
CLIP APPLIE 9.375 SM OPEN (CLIP) IMPLANT
CLOTH BEACON ORANGE TIMEOUT ST (SAFETY) ×1 IMPLANT
COVER LIGHT HANDLE STERIS (MISCELLANEOUS) ×2 IMPLANT
DERMABOND ADVANCED (GAUZE/BANDAGES/DRESSINGS) ×1
DERMABOND ADVANCED .7 DNX12 (GAUZE/BANDAGES/DRESSINGS) ×1 IMPLANT
DEVICE DUBIN W/COMP PLATE 8390 (MISCELLANEOUS) IMPLANT
ELECT REM PT RETURN 9FT ADLT (ELECTROSURGICAL) ×1
ELECTRODE REM PT RTRN 9FT ADLT (ELECTROSURGICAL) ×1 IMPLANT
GLOVE BIO SURGEON STRL SZ 6.5 (GLOVE) ×1 IMPLANT
GLOVE BIO SURGEON STRL SZ7 (GLOVE) IMPLANT
GLOVE BIOGEL PI IND STRL 6.5 (GLOVE) ×1 IMPLANT
GLOVE BIOGEL PI IND STRL 7.0 (GLOVE) ×2 IMPLANT
GLOVE BIOGEL PI INDICATOR 6.5 (GLOVE) ×1
GLOVE BIOGEL PI INDICATOR 7.0 (GLOVE) ×3
GOWN STRL REUS W/TWL LRG LVL3 (GOWN DISPOSABLE) ×2 IMPLANT
KIT MARKER MARGIN INK (KITS) IMPLANT
KIT TURNOVER KIT A (KITS) ×1 IMPLANT
MANIFOLD NEPTUNE II (INSTRUMENTS) ×1 IMPLANT
NDL HYPO 25X1 1.5 SAFETY (NEEDLE) ×1 IMPLANT
NEEDLE HYPO 25X1 1.5 SAFETY (NEEDLE) ×1 IMPLANT
NS IRRIG 1000ML POUR BTL (IV SOLUTION) ×1 IMPLANT
PACK MINOR (CUSTOM PROCEDURE TRAY) ×1 IMPLANT
PAD ARMBOARD 7.5X6 YLW CONV (MISCELLANEOUS) ×1 IMPLANT
SET BASIN LINEN APH (SET/KITS/TRAYS/PACK) ×1 IMPLANT
SET LOCALIZER 20 PROBE US (MISCELLANEOUS) ×1 IMPLANT
SUT MNCRL AB 4-0 PS2 18 (SUTURE) ×1 IMPLANT
SUT VIC AB 3-0 SH 27 (SUTURE) ×1
SUT VIC AB 3-0 SH 27X BRD (SUTURE) ×1 IMPLANT
SYR CONTROL 10ML LL (SYRINGE) ×1 IMPLANT

## 2021-12-03 NOTE — Interval H&P Note (Signed)
History and Physical Interval Note:  12/03/2021 8:27 AM  Stacy Webb  has presented today for surgery, with the diagnosis of Left breast cancer.  The various methods of treatment have been discussed with the patient and family. After consideration of risks, benefits and other options for treatment, the patient has consented to  Procedure(s): PARTIAL MASTECTOMY WITH RADIO FREQUENCY LOCALIZER (Left) as a surgical intervention.  The patient's history has been reviewed, patient examined, no change in status, stable for surgery.  I have reviewed the patient's chart and labs.  Questions were answered to the patient's satisfaction.    Marked.  Virl Cagey

## 2021-12-03 NOTE — Discharge Instructions (Signed)
Discharge instructions after breast surgery:   Common Complaints: Pain and bruising at the incision sites.  Swelling at the incision sites. Stiffness of the arm.   Diet/ Activity: Diet as tolerated.  You may shower but do not take hot showers as this can disrupt the glue. Rest and listen to your body, but do not remain in bed all day.  Walk everyday for at least 15-20 minutes. Deep cough and move around every 1-2 hours in the first few days after surgery.  Do not lift > 10 lbs for the first 2 weeks after surgery. Do not do anything that makes you feel like you are putting unnecessary pull or stretch on the incision sites.  Do move your arm and shoulder (see exercises options below). If you do not move then you can get stiff and hurt more.  Do not pick at the dermabond glue on your incision sites.  This glue film will remain in place for 1-2 weeks and will start to peel off.  Do not place lotions or balms on your incision unless instructed to specifically by Dr. Constance Haw.   Pain Expectations and Narcotics: -After surgery you will have pain associated with your incisions and this is normal. The pain is muscular and nerve pain, and will get better with time. -You are encouraged and expected to take non narcotic medications like tylenol and ibuprofen (when able) to treat pain as multiple modalities can aid with pain treatment. -Narcotics are only used when pain is severe or there is breakthrough pain. -You are not expected to have a pain score of 0 after surgery, as we cannot prevent pain. A pain score of 3-4 that allows you to be functional, move, walk, and tolerate some activity is the goal. The pain will continue to improve over the days after surgery and is dependent on your surgery. -Due to  law, we are only able to give a certain amount of pain medication to treat post operative pain, and we only give additional narcotics on a patient by patient basis.  -For most laparoscopic surgery,  studies have shown that the majority of patients only need 10-15 narcotic pills, and for open surgeries most patients only need 15-20.   -Having appropriate expectations of pain and knowledge of pain management with non narcotics is important as we do not want anyone to become addicted to narcotic pain medication.  -Using ice packs in the first 48 hours and heating pads after 48 hours, wearing an abdominal binder (when recommended), and using over the counter medications are all ways to help with pain management.   -Simple acts like meditation and mindfulness practices after surgery can also help with pain control and research has proven the benefit of these practices.  Medication: Take tylenol and ibuprofen as needed for pain control, alternating every 4-6 hours.  Example:  Tylenol '1000mg'$  @ 6am, 12noon, 6pm, 91mdnight (Do not exceed '4000mg'$  of tylenol a day). Ibuprofen '800mg'$  @ 9am, 3pm, 9pm, 3am (Do not exceed '3600mg'$  of ibuprofen a day).  Take Roxicodone for breakthrough pain every 4 hours.  Take Colace for constipation related to narcotic pain medication. If you do not have a bowel movement in 2 days, take Miralax over the counter.  Drink plenty of water to also prevent constipation.   Contact Information: If you have questions or concerns, please call our office, [phone] Monday- Thursday 8AM-5PM and Friday 8AM-12Noon.  If it is after hours or on the weekend, please call [facility] Main Number, [phone]  and ask to speak to the surgeon on call for Dr. Constance Haw at [facility].

## 2021-12-03 NOTE — Transfer of Care (Signed)
Immediate Anesthesia Transfer of Care Note  Patient: Stacy Webb  Procedure(s) Performed: PARTIAL MASTECTOMY WITH RADIO FREQUENCY LOCALIZER (Left: Breast)  Patient Location: PACU  Anesthesia Type:General  Level of Consciousness: awake, alert , oriented and patient cooperative  Airway & Oxygen Therapy: Patient Spontanous Breathing and Patient connected to nasal cannula oxygen  Post-op Assessment: Report given to RN, Post -op Vital signs reviewed and stable and Patient moving all extremities  Post vital signs: Reviewed and stable  Last Vitals:  Vitals Value Taken Time  BP 169/88 12/03/21 1124  Temp    Pulse 86 12/03/21 1127  Resp 14 12/03/21 1127  SpO2 99 % 12/03/21 1127  Vitals shown include unvalidated device data.  Last Pain:  Vitals:   12/03/21 0808  TempSrc: Oral  PainSc: 0-No pain      Patients Stated Pain Goal: 8 (01/77/93 9030)  Complications: No notable events documented.

## 2021-12-03 NOTE — Op Note (Signed)
[facility] Operative Note  12/03/21  Preoperative Diagnosis:  Left breast cancer   Postoperative Diagnosis: Same   Procedure(s) Performed: Left breast partial mastectomy after radiofrequency tag    Surgeon: Lanell Matar. Constance Haw, MD   Assistants: No qualified resident was available    Anesthesia: General endotracheal   Anesthesiologist: Dr. Briant Cedar, MD    Specimens: Left breast mass, painted    Estimated Blood Loss: Minimal   Blood Replacement: None    Complications: None    Wound Class: Clean   Operative Indications: Ms. Slater is a 76 yo with an invasive left breast cancer that is ER/PR positive. We discussed partial mastectomy after radiofrequency tag and that we do not have to do a sentinel node given her age and hormone status.  We discussed risk of bleeding, infection, need for further surgery and treatment.    Findings: Specimen with tag and biopsy clip    Procedure: The patient was taken to the operating room and placed supine. General endotracheal anesthesia was induced. Intravenous antibiotics were administered per protocol.  The left breast was prepped in the normal sterile fashion. The mammograms after radiofrequency tag placement were reviewed.   The localizer was used to identify the shortest point to the tag.  A curvilinear incision was made at the lateral side of the breast. Flaps were created. Using the localizer probe the breast tissue around the biopsy clip and tag was excised using a combination of cautery and sharp dissection. The specimen was removed and was painted for pathology. There was 1 cm + margin around the entire specimen. The cavity was made hemostatic, clips were placed to mark the cavity, and the cavity was left open for seroma formation. The flaps over the cavity were closed with interrupted 3-0 Vicryl. The skin was closed with 4-0 Monocryl subcuticular and dermabond.   Final inspection revealed acceptable hemostasis. All counts  were correct at the end of the case. The patient was awakened from anesthesia and extubated without complication.  The patient went to the PACU in stable condition.   Curlene Labrum, MD [facility] [address] [place], [place] [address] [phone] (office)

## 2021-12-03 NOTE — Progress Notes (Signed)
[facility]  Updated sister. Will see in office in 2 weeks. Rx sent to [facility].  Curlene Labrum, MD [facility] [address] [place], [place] [address] [phone] (office)

## 2021-12-03 NOTE — Anesthesia Procedure Notes (Signed)
Procedure Name: LMA Insertion Date/Time: 12/03/2021 10:07 AM  Performed by: Encarnacion Chu, RNPre-anesthesia Checklist: Patient identified, Emergency Drugs available, Suction available, Patient being monitored and Timeout performed Patient Re-evaluated:Patient Re-evaluated prior to induction Oxygen Delivery Method: Circle system utilized Preoxygenation: Pre-oxygenation with 100% oxygen Induction Type: IV induction LMA: LMA inserted LMA Size: 4.0 Number of attempts: 1 Dental Injury: Teeth and Oropharynx as per pre-operative assessment

## 2021-12-03 NOTE — Anesthesia Preprocedure Evaluation (Signed)
Anesthesia Evaluation  Patient identified by MRN, date of birth, ID band Patient awake    Reviewed: Allergy & Precautions, H&P , NPO status , Patient's Chart, lab work & pertinent test results, reviewed documented beta blocker date and time   Airway Mallampati: II  TM Distance: >3 FB Neck ROM: full    Dental no notable dental hx.    Pulmonary neg pulmonary ROS,    Pulmonary exam normal breath sounds clear to auscultation       Cardiovascular Exercise Tolerance: Good hypertension, negative cardio ROS   Rhythm:regular Rate:Normal     Neuro/Psych PSYCHIATRIC DISORDERS Anxiety  Neuromuscular disease    GI/Hepatic negative GI ROS, Neg liver ROS,   Endo/Other  negative endocrine ROSdiabetes, Type 2  Renal/GU CRFRenal disease  negative genitourinary   Musculoskeletal   Abdominal   Peds  Hematology  (+) Blood dyscrasia, anemia ,   Anesthesia Other Findings   Reproductive/Obstetrics negative OB ROS                             Anesthesia Physical Anesthesia Plan  ASA: 2  Anesthesia Plan: General and General LMA   Post-op Pain Management:    Induction:   PONV Risk Score and Plan: Ondansetron  Airway Management Planned:   Additional Equipment:   Intra-op Plan:   Post-operative Plan:   Informed Consent: I have reviewed the patients History and Physical, chart, labs and discussed the procedure including the risks, benefits and alternatives for the proposed anesthesia with the patient or authorized representative who has indicated his/her understanding and acceptance.     Dental Advisory Given  Plan Discussed with: CRNA  Anesthesia Plan Comments:         Anesthesia Quick Evaluation

## 2021-12-04 NOTE — Anesthesia Postprocedure Evaluation (Signed)
Anesthesia Post Note  Patient: Stacy Webb  Procedure(s) Performed: PARTIAL MASTECTOMY WITH RADIO FREQUENCY LOCALIZER (Left: Breast)  Patient location during evaluation: Phase II Anesthesia Type: General Level of consciousness: awake Pain management: pain level controlled Vital Signs Assessment: post-procedure vital signs reviewed and stable Respiratory status: spontaneous breathing and respiratory function stable Cardiovascular status: blood pressure returned to baseline and stable Postop Assessment: no headache and no apparent nausea or vomiting Anesthetic complications: no Comments: Late entry   No notable events documented.   Last Vitals:  Vitals:   12/03/21 1200 12/03/21 1215  BP: (!) 156/87 (!) 163/85  Pulse: 71 70  Resp: 20 18  Temp:  36.4 C  SpO2: 100% 100%    Last Pain:  Vitals:   12/04/21 0839  TempSrc:   PainSc: 0-No pain                 Louann Sjogren

## 2021-12-05 LAB — SURGICAL PATHOLOGY

## 2021-12-05 NOTE — Progress Notes (Signed)
Can you let patient know the cancer is all gone and margins are negative.

## 2021-12-07 ENCOUNTER — Other Ambulatory Visit: Payer: Self-pay

## 2021-12-07 MED ORDER — ATORVASTATIN CALCIUM 20 MG PO TABS: ORAL_TABLET | ORAL | 0 refills | Status: DC

## 2021-12-12 ENCOUNTER — Ambulatory Visit: Payer: Medicare Other | Admitting: Nurse Practitioner

## 2021-12-18 ENCOUNTER — Encounter: Payer: Self-pay | Admitting: General Surgery

## 2021-12-18 ENCOUNTER — Ambulatory Visit (INDEPENDENT_AMBULATORY_CARE_PROVIDER_SITE_OTHER): Payer: Medicare Other | Admitting: General Surgery

## 2021-12-18 VITALS — BP 150/77 | HR 66 | Temp 98.2°F | Resp 14 | Ht 66.0 in | Wt 147.0 lb

## 2021-12-18 DIAGNOSIS — C50412 Malignant neoplasm of upper-outer quadrant of left female breast: Secondary | ICD-10-CM

## 2021-12-18 NOTE — Patient Instructions (Signed)
Activity and diet as tolerated  Follow up with Oncology.

## 2021-12-18 NOTE — Progress Notes (Signed)
[facility]  Incision healing well. Margins all clear. Has an appt with Oncology 10/4.  BP (!) 150/77   Pulse 66   Temp 98.2 F (36.8 C) (Oral)   Resp 14   Ht '5\' 6"'$  (1.676 m)   Wt 147 lb (66.7 kg)   SpO2 94%   BMI 23.73 kg/m  Incision without erythema or drainage, not tender  Patient s/p left partial mastectomy for cancer. Doing well.  Activity and diet as tolerated  Follow up with Oncology.  Annual mammograms   Curlene Labrum, MD [facility] [address] [place], [place] [address] [phone] (office)

## 2022-01-09 ENCOUNTER — Inpatient Hospital Stay: Payer: Medicare Other | Attending: Hematology | Admitting: Hematology

## 2022-01-09 ENCOUNTER — Encounter: Payer: Self-pay | Admitting: Hematology

## 2022-01-09 ENCOUNTER — Encounter: Payer: Self-pay | Admitting: Lab

## 2022-01-09 VITALS — BP 158/80 | HR 89 | Temp 98.3°F | Resp 16 | Wt 144.3 lb

## 2022-01-09 DIAGNOSIS — Z87442 Personal history of urinary calculi: Secondary | ICD-10-CM | POA: Diagnosis not present

## 2022-01-09 DIAGNOSIS — Z79811 Long term (current) use of aromatase inhibitors: Secondary | ICD-10-CM | POA: Insufficient documentation

## 2022-01-09 DIAGNOSIS — I10 Essential (primary) hypertension: Secondary | ICD-10-CM | POA: Insufficient documentation

## 2022-01-09 DIAGNOSIS — Z79899 Other long term (current) drug therapy: Secondary | ICD-10-CM | POA: Insufficient documentation

## 2022-01-09 DIAGNOSIS — Z800 Family history of malignant neoplasm of digestive organs: Secondary | ICD-10-CM | POA: Diagnosis not present

## 2022-01-09 DIAGNOSIS — Z836 Family history of other diseases of the respiratory system: Secondary | ICD-10-CM | POA: Insufficient documentation

## 2022-01-09 DIAGNOSIS — K5900 Constipation, unspecified: Secondary | ICD-10-CM | POA: Insufficient documentation

## 2022-01-09 DIAGNOSIS — Z170 Estrogen receptor positive status [ER+]: Secondary | ICD-10-CM | POA: Diagnosis not present

## 2022-01-09 DIAGNOSIS — Z818 Family history of other mental and behavioral disorders: Secondary | ICD-10-CM | POA: Insufficient documentation

## 2022-01-09 DIAGNOSIS — Z9049 Acquired absence of other specified parts of digestive tract: Secondary | ICD-10-CM | POA: Diagnosis not present

## 2022-01-09 DIAGNOSIS — E785 Hyperlipidemia, unspecified: Secondary | ICD-10-CM | POA: Insufficient documentation

## 2022-01-09 DIAGNOSIS — E119 Type 2 diabetes mellitus without complications: Secondary | ICD-10-CM | POA: Diagnosis not present

## 2022-01-09 DIAGNOSIS — Z833 Family history of diabetes mellitus: Secondary | ICD-10-CM | POA: Diagnosis not present

## 2022-01-09 DIAGNOSIS — Z881 Allergy status to other antibiotic agents status: Secondary | ICD-10-CM | POA: Diagnosis not present

## 2022-01-09 DIAGNOSIS — C50412 Malignant neoplasm of upper-outer quadrant of left female breast: Secondary | ICD-10-CM | POA: Insufficient documentation

## 2022-01-09 MED ORDER — ANASTROZOLE 1 MG PO TABS: 1.0000 mg | ORAL_TABLET | Freq: Every day | ORAL | 6 refills | Status: DC

## 2022-01-09 NOTE — Patient Instructions (Addendum)
[facility]  Discharge Instructions  You were seen and examined today by Dr. Delton Coombes.  Dr. Delton Coombes sent in Anastrozole for you to take once daily along with over the counter Vitamin D and Calcium once daily. You may experience hot flashes and body aches but this should resolve after a few months. He is also sending a referral to [facility] for radiation.   Follow-up as scheduled in 4 months with labs.    Thank you for choosing [facility] to provide your oncology and hematology care.   To afford each patient quality time with our provider, please arrive at least 15 minutes before your scheduled appointment time. You may need to reschedule your appointment if you arrive late (10 or more minutes). Arriving late affects you and other patients whose appointments are after yours.  Also, if you miss three or more appointments without notifying the office, you may be dismissed from the clinic at the provider's discretion.    Again, thank you for choosing [facility].  Our hope is that these requests will decrease the amount of time that you wait before being seen by our physicians.   If you have a lab appointment with the [facility] please come in thru the Main Entrance and check in at the main information desk.           _____________________________________________________________  Should you have questions after your visit to [facility], please contact our office at [phone] and follow the prompts.  Our office hours are 8:00 a.m. to 4:30 p.m. Monday - Thursday and 8:00 a.m. to 2:30 p.m. Friday.  Please note that voicemails left after 4:00 p.m. may not be returned until the following business day.  We are closed weekends and all major holidays.  You do have access to a nurse 24-7, just call the main number to the clinic [phone] and do not press any options, hold on the line and a nurse will answer the  phone.    For prescription refill requests, have your pharmacy contact our office and allow 72 hours.    Masks are optional in the cancer centers. If you would like for your care team to wear a mask while they are taking care of you, please let them know. You may have one support person who is at least 75 years old accompany you for your appointments.

## 2022-01-09 NOTE — Progress Notes (Unsigned)
Referral sent to [facility] .  Records faxed on 10/4

## 2022-01-09 NOTE — Progress Notes (Signed)
[place], [place] [address]   CLINIC:  Medical Oncology/Hematology  PCP:  Coral Spikes, DO [place] [address] [phone]   REASON FOR VISIT:  Follow-up for left breast cancer.  PRIOR THERAPY: Left lumpectomy on 12/03/2021  NGS Results: Not applicable  CURRENT THERAPY: Anastrozole  BRIEF ONCOLOGIC HISTORY:  Oncology History   No history exists.    CANCER STAGING:  Cancer Staging  Breast cancer of upper-outer quadrant of left female breast [facility]) Staging form: Breast, AJCC 8th Edition - Clinical stage from 11/12/2021: Stage IA (cT1a, cN0, cM0, G1, ER+, PR+, HER2-) - Unsigned    INTERVAL HISTORY:  Ms. Hazzard 76 y.o. female seen for follow-up of breast cancer.  She underwent left lumpectomy on 12/03/2021 by Dr. Constance Haw.  She has recovered very well from surgery.  Reports energy levels of 100%.  Mild constipation is stable.    REVIEW OF SYSTEMS:  Review of Systems  Gastrointestinal:  Positive for constipation.  All other systems reviewed and are negative.    PAST MEDICAL/SURGICAL HISTORY:  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[place])    Diabetes mellitus without complication ([place])    Diabetic foot ulcer ([place]) 09/27/2016   Excessive hair growth 07/19/2015   Heart murmur    History of kidney stones    Hyperlipidemia    Hypertension    Neuromuscular disorder ([place])    diabetic neuropathy   Neuropathy    Postmenopausal 07/19/2015   Past Surgical History:  Procedure Laterality Date   ANTERIOR AND POSTERIOR REPAIR N/A 04/28/2018   Procedure: ANTERIOR (CYSTOCELE) AND POSTERIOR REPAIR (RECTOCELE);  Surgeon: Jonnie Kind, MD;  Location: [facility] ORS;  Service: Gynecology;  Laterality: N/A;   APPENDECTOMY     CARPAL TUNNEL RELEASE Bilateral 05-05-1999   CESAREAN SECTION     COLONOSCOPY N/A 08/28/2012   Procedure: COLONOSCOPY;   Surgeon: Rogene Houston, MD;  Location: [facility] ENDO SUITE;  Service: Endoscopy;  Laterality: N/A;  915   COLONOSCOPY N/A 12/25/2017   Procedure: COLONOSCOPY;  Surgeon: Rogene Houston, MD;  Location: [facility] ENDO SUITE;  Service: Endoscopy;  Laterality: N/A;  12:45   REVERSE SHOULDER ARTHROPLASTY Left 07/10/2020   Procedure: REVERSE SHOULDER ARTHROPLASTY;  Surgeon: [name], MD;  Location: [facility] ORS;  Service: Orthopedics;  Laterality: Left;     SOCIAL HISTORY:  Social History   Socioeconomic History   Marital status: Widowed    Spouse name: Not on file   Number of children: 3   Years of education: Not on file   Highest education level: Not on file  Occupational History   Occupation: [employer]    Comment: Medical Records  Tobacco Use   Smoking status: Never   Smokeless tobacco: Never  Vaping Use   Vaping Use: Never used  Substance and Sexual Activity   Alcohol use: No   Drug use: No   Sexual activity: Not Currently    Birth control/protection: Post-menopausal  Other Topics Concern   Not on file  Social History Narrative   Husband had ALS and passed away.Twin boys, but one died at 54 months and her other son died in May 04, 2014.      Living daughter- lives with pt.   Social Determinants of Health   Financial Resource Strain: Low Risk  (10/02/2021)   Overall  Financial Resource Strain (CARDIA)    Difficulty of Paying Living Expenses: Not hard at all  Food Insecurity: No Food Insecurity (10/02/2021)   Hunger Vital Sign    Worried About Running Out of Food in the Last Year: Never true    Ran Out of Food in the Last Year: Never true  Transportation Needs: No Transportation Needs (10/02/2021)   PRAPARE - Hydrologist (Medical): No    Lack of Transportation (Non-Medical): No  Physical Activity: Insufficiently Active (10/02/2021)   Exercise Vital Sign    Days of Exercise per Week: 2 days    Minutes of Exercise per Session: 60 min  Stress: No Stress Concern Present  (10/02/2021)   [place]    Feeling of Stress : Not at all  Social Connections: [place] (10/02/2021)   Social Connection and Isolation Panel [NHANES]    Frequency of Communication with Friends and Family: More than three times a week    Frequency of Social Gatherings with Friends and Family: More than three times a week    Attends Religious Services: More than 4 times per year    Active Member of [employer] or Organizations: Yes    Attends Music therapist: More than 4 times per year    Marital Status: Married  Human resources officer Violence: Not At Risk (10/02/2021)   Humiliation, Afraid, Rape, and Kick questionnaire    Fear of Current or Ex-Partner: No    Emotionally Abused: No    Physically Abused: No    Sexually Abused: No    FAMILY HISTORY:  Family History  Problem Relation Age of Onset   Pneumonia Son    Suicidality Son    Diabetes Daughter    Colon cancer Paternal Aunt     CURRENT MEDICATIONS:  Outpatient Encounter Medications as of 01/09/2022  Medication Sig   anastrozole (ARIMIDEX) 1 MG tablet Take 1 tablet (1 mg total) by mouth daily.   atorvastatin (LIPITOR) 20 MG tablet Take 20 mg by mouth daily.   blood glucose meter kit and supplies Dispense based on patient and insurance preference. Use up to four times daily as directed. (FOR ICD-10 E10.9, E11.9).   diltiazem (CARDIZEM LA) 240 MG 24 hr tablet Take 1 tablet (240 mg total) by mouth daily.   docusate sodium (COLACE) 100 MG capsule Take 100 mg by mouth at bedtime.   glimepiride (AMARYL) 1 MG tablet Take 1 tablet (1 mg total) by mouth daily with breakfast.   Glucagon (GVOKE HYPOPEN 2-PACK) 0.5 MG/0.1ML SOAJ Inject 1 each into the skin as needed. Inject if blood sugar is < 55 mg/dL   hydrochlorothiazide (HYDRODIURIL) 25 MG tablet TAKE 1 TABLET BY MOUTH  DAILY (Patient taking differently: Take 12.5 mg by mouth daily.)   losartan (COZAAR) 50  MG tablet Take 25 mg by mouth daily.   [DISCONTINUED] cholecalciferol (VITAMIN D3) 25 MCG (1000 UNIT) tablet Take 1,000 Units by mouth daily.   [DISCONTINUED] conjugated estrogens (PREMARIN) vaginal cream Place 6.14 Applicatorfuls vaginally 3 (three) times a week. For vaginal thinning and irritation in preop timeframe   [DISCONTINUED] ferrous sulfate 325 (65 FE) MG tablet Take 325 mg by mouth daily with breakfast.    [DISCONTINUED] ondansetron (ZOFRAN) 4 MG tablet Take 1 tablet (4 mg total) by mouth every 8 (eight) hours as needed.   [DISCONTINUED] oxyCODONE (ROXICODONE) 5 MG immediate release tablet Take 1 tablet (5 mg total) by mouth every 4 (  four) hours as needed for severe pain or breakthrough pain.   [DISCONTINUED] vitamin B-12 (CYANOCOBALAMIN) 500 MCG tablet Take 500 mcg by mouth daily.   No facility-administered encounter medications on file as of 01/09/2022.    ALLERGIES:  Allergies  Allergen Reactions   Clindamycin/Lincomycin Other (See Comments)    Mouth taste like metal   Enalapril Cough     PHYSICAL EXAM:  ECOG Performance status: 1  Vitals:   01/09/22 1458  BP: (!) 158/80  Pulse: 89  Resp: 16  Temp: 98.3 F (36.8 C)  SpO2: 99%   Filed Weights   01/09/22 1458  Weight: 144 lb 4.8 oz (65.5 kg)   Physical Exam Vitals reviewed.  Constitutional:      Appearance: Normal appearance.  Cardiovascular:     Rate and Rhythm: Regular rhythm.     Pulses: Normal pulses.     Heart sounds: Normal heart sounds.  Pulmonary:     Breath sounds: Normal breath sounds.  Abdominal:     Palpations: Abdomen is soft. There is no mass.  Neurological:     Mental Status: She is alert.  Psychiatric:        Mood and Affect: Mood normal.        Behavior: Behavior normal.      LABORATORY DATA:  I have reviewed the labs as listed.  CBC    Component Value Date/Time   WBC 7.1 11/15/2021 0859   RBC 3.70 (L) 11/15/2021 0859   HGB 10.3 (L) 11/15/2021 0859   HGB 10.7 (L) 03/02/2021  0907   HCT 31.3 (L) 11/15/2021 0859   HCT 33.4 (L) 03/02/2021 0907   PLT 264 11/15/2021 0859   PLT 294 03/02/2021 0907   MCV 84.6 11/15/2021 0859   MCV 85 03/02/2021 0907   MCH 27.8 11/15/2021 0859   MCHC 32.9 11/15/2021 0859   RDW 15.2 11/15/2021 0859   RDW 14.3 03/02/2021 0907   LYMPHSABS 1.8 11/15/2021 0859   LYMPHSABS 2.2 03/02/2021 0907   MONOABS 1.1 (H) 11/15/2021 0859   EOSABS 0.3 11/15/2021 0859   EOSABS 0.2 03/02/2021 0907   BASOSABS 0.1 11/15/2021 0859   BASOSABS 0.1 03/02/2021 0907      Latest Ref Rng & Units 11/15/2021    8:59 AM 03/02/2021    9:07 AM 12/06/2020    8:43 AM  CMP  Glucose 70 - 99 mg/dL 118  105  113   BUN 8 - 23 mg/dL 31  36  32   Creatinine 0.44 - 1.00 mg/dL 1.56  1.64  1.56   Sodium 135 - 145 mmol/L 139  140  141   Potassium 3.5 - 5.1 mmol/L 4.1  5.0  4.1   Chloride 98 - 111 mmol/L 108  105  103   CO2 22 - 32 mmol/L _0 Calcium 8.9 - 10.3 mg/dL 9.1  10.0  9.8   Total Protein 6.5 - 8.1 g/dL 7.5  6.8  6.9   Total Bilirubin 0.3 - 1.2 mg/dL 0.5  0.3  0.3   Alkaline Phos 38 - 126 U/L 70  83  67   AST 15 - 41 U/L _1 ALT 0 - 44 U/L _2 DIAGNOSTIC IMAGING:  I have independently reviewed the scans and discussed with the patient.  ASSESSMENT:  1.  Stage I (T1AN0G1) left breast IDC, ER/PR positive, HER2 negative: - Screening mammogram (10/17/2021): Abnormal. -  Diagnostic mammogram/ultrasound (10/23/2021): Irregular hypoechoic mass in the left breast at 3:00 measuring 0.5 x 0.5 x 0.4 cm.  No lymphadenopathy in the left axilla. - Biopsy (11/06/2021): Invasive ductal carcinoma, grade 1, ER 90% strong staining, PR 40%, Ki-67 1%, HER2 1+ - Left lumpectomy on 12/03/2021 - Pathology: Invasive carcinoma, ductal type, grade 1, 4 mm in greatest dimension, margins negative.  ER 90%, PR 40%, HER2 1+, Ki-67 1%.  PT1AP NX.  2.  Social/family history: - She lives by herself at home.  Daughter is temporarily living with her.  She is  independent of ADLs and IADLs.  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  Never smoker. - Father had lung cancer and was smoker.  Paternal aunt had cancer of the female organs.  Brother had throat cancer and was smoker.     PLAN:  1.  Stage I (PT1ANx G1) left breast IDC, ER/PR positive, HER2 negative: - We reviewed pathology report in detail. - No indication for adjuvant chemotherapy. - We discussed role of antiestrogen therapy with anastrozole for at least 5 years.  We discussed side effects in detail including but not limited to hot flashes, musculoskeletal symptoms and decreased bone mineral density. - We have sent a prescription to her pharmacy.  She will start taking the pills tomorrow. - We will make a referral to radiation oncology to discuss. - RTC 4 months for follow-up with repeat labs.  2.  Bone health: - We discussed bone density test from 11/15/2021, T score -0.3, normal. - Vitamin D level was 40.4. - Recommend to start taking calcium and vitamin D supplements.  Plan to repeat DEXA scan in 3 years.      Orders placed this encounter:  Orders Placed This Encounter  Procedures   CBC with Differential/Platelet   Comprehensive metabolic panel   VITAMIN D 25 Hydroxy (Vit-D Deficiency, Fractures)      Derek Jack, MD [place] [phone]

## 2022-01-17 DIAGNOSIS — M79672 Pain in left foot: Secondary | ICD-10-CM | POA: Diagnosis not present

## 2022-01-17 DIAGNOSIS — E1140 Type 2 diabetes mellitus with diabetic neuropathy, unspecified: Secondary | ICD-10-CM | POA: Diagnosis not present

## 2022-01-17 DIAGNOSIS — C50812 Malignant neoplasm of overlapping sites of left female breast: Secondary | ICD-10-CM | POA: Diagnosis not present

## 2022-01-17 DIAGNOSIS — L110 Acquired keratosis follicularis: Secondary | ICD-10-CM | POA: Diagnosis not present

## 2022-01-17 DIAGNOSIS — M79671 Pain in right foot: Secondary | ICD-10-CM | POA: Diagnosis not present

## 2022-01-17 DIAGNOSIS — I739 Peripheral vascular disease, unspecified: Secondary | ICD-10-CM | POA: Diagnosis not present

## 2022-01-17 DIAGNOSIS — Z170 Estrogen receptor positive status [ER+]: Secondary | ICD-10-CM | POA: Diagnosis not present

## 2022-01-23 ENCOUNTER — Other Ambulatory Visit: Payer: Self-pay

## 2022-01-23 DIAGNOSIS — C50412 Malignant neoplasm of upper-outer quadrant of left female breast: Secondary | ICD-10-CM

## 2022-01-23 MED ORDER — ANASTROZOLE 1 MG PO TABS: 1.0000 mg | ORAL_TABLET | Freq: Every day | ORAL | 6 refills | Status: DC

## 2022-02-07 ENCOUNTER — Other Ambulatory Visit: Payer: Self-pay | Admitting: Family Medicine

## 2022-02-11 ENCOUNTER — Other Ambulatory Visit: Payer: Self-pay | Admitting: Family Medicine

## 2022-02-11 MED ORDER — LOSARTAN POTASSIUM 50 MG PO TABS: 25.0000 mg | ORAL_TABLET | Freq: Every day | ORAL | 3 refills | Status: DC

## 2022-02-26 ENCOUNTER — Ambulatory Visit (INDEPENDENT_AMBULATORY_CARE_PROVIDER_SITE_OTHER): Payer: Medicare Other | Admitting: Family Medicine

## 2022-02-26 VITALS — BP 139/87 | HR 72 | Ht 66.0 in | Wt 152.8 lb

## 2022-02-26 DIAGNOSIS — N1832 Chronic kidney disease, stage 3b: Secondary | ICD-10-CM | POA: Diagnosis not present

## 2022-02-26 DIAGNOSIS — I10 Essential (primary) hypertension: Secondary | ICD-10-CM

## 2022-02-26 DIAGNOSIS — E785 Hyperlipidemia, unspecified: Secondary | ICD-10-CM

## 2022-02-26 DIAGNOSIS — Z23 Encounter for immunization: Secondary | ICD-10-CM | POA: Diagnosis not present

## 2022-02-26 DIAGNOSIS — E1122 Type 2 diabetes mellitus with diabetic chronic kidney disease: Secondary | ICD-10-CM | POA: Diagnosis not present

## 2022-02-26 NOTE — Patient Instructions (Signed)
Continue your medications.  A1C today.  Follow up in 6 months.  Take care  Dr. Lacinda Axon

## 2022-02-27 LAB — HEMOGLOBIN A1C
Est. average glucose Bld gHb Est-mCnc: 140 mg/dL
Hgb A1c MFr Bld: 6.5 % — ABNORMAL HIGH (ref 4.8–5.6)

## 2022-03-01 NOTE — Progress Notes (Signed)
Subjective:  Patient ID: Stacy Webb, female    DOB: 02/28/1946  Age: 76 y.o. MRN: [id number]  CC: Chief Complaint  Patient presents with   Hypertension    Follow up Fingers have knots and bent- wonders if it could be early Huntington's disease Patient also needs script for new shoes    HPI:  76 year old female with Hypertension, DM-2 with complications including CKD and Charcot foot, Breast cancer, and Hyperlipidemia presents for follow up.  Patient has Osteoarthritis of the hands. Relative has suggested that she could have Huntington's disease. No family hx. Informed patient that she does not have Huntington's disease.   Patient states that she will need a form filled out for her diabetic shoes in the future.  Hypertension well-controlled on losartan/HCTZ as well as diltiazem.  Diabetes has been at goal on Amaryl. Needs A1C today.  Lipids well controlled on Lipitor.  Patient Active Problem List   Diagnosis Date Noted   Type 2 diabetes with kidney complications (Stacy Webb) 46/27/0350   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Stacy Webb)    Breast cancer of upper-outer quadrant of left female breast (Stacy Webb) 11/12/2021   Cholelithiasis 10/29/2020   Rotator cuff arthropathy of left shoulder 07/10/2020   CKD (chronic kidney disease) stage 3, GFR 30-59 ml/min (Stacy Webb) 09/27/2016   Hyperlipidemia 09/27/2016   Essential hypertension, benign 08/11/2012    Social Hx   Social History   Socioeconomic History   Marital status: Widowed    Spouse name: Not on file   Number of children: 3   Years of education: Not on file   Highest education level: Not on file  Occupational History   Occupation: [employer]    Comment: Medical Records  Tobacco Use   Smoking status: Never   Smokeless tobacco: Never  Vaping Use   Vaping Use: Never used  Substance and Sexual Activity   Alcohol use: No   Drug use: No   Sexual activity: Not Currently    Birth control/protection: Post-menopausal  Other Topics  Concern   Not on file  Social History Narrative   Husband had ALS and passed away.Twin boys, but one died at 44 months and her other son died in Jun 23, 2014.      Living daughter- lives with pt.   Social Determinants of Health   Financial Resource Strain: Low Risk  (10/02/2021)   Overall Financial Resource Strain (CARDIA)    Difficulty of Paying Living Expenses: Not hard at all  Food Insecurity: No Food Insecurity (10/02/2021)   Hunger Vital Sign    Worried About Running Out of Food in the Last Year: Never true    Ran Out of Food in the Last Year: Never true  Transportation Needs: No Transportation Needs (10/02/2021)   PRAPARE - Stacy Webb (Medical): No    Lack of Transportation (Non-Medical): No  Physical Activity: Insufficiently Active (10/02/2021)   Exercise Vital Sign    Days of Exercise per Week: 2 days    Minutes of Exercise per Session: 60 min  Stress: No Stress Concern Present (10/02/2021)   Stacy Webb    Feeling of Stress : Not at all  Social Connections: Stacy Webb (10/02/2021)   Social Connection and Isolation Panel [NHANES]    Frequency of Communication with Friends and Family: More than three times a week    Frequency of Social Gatherings with Friends and Family: More than three times a week  Attends Religious Services: More than 4 times per year    Active Member of Clubs or Organizations: Yes    Attends Archivist Meetings: More than 4 times per year    Marital Status: Married    Review of Systems Per HPI  Objective:  BP 139/87   Pulse 72   Ht '5\' 6"'$  (1.676 m)   Wt 152 lb 12.8 oz (69.3 kg)   BMI 24.66 kg/m      02/26/2022    9:49 AM 01/09/2022    2:58 PM 12/18/2021    8:59 AM  BP/Weight  Systolic BP 633 354 562  Diastolic BP 87 80 77  Wt. (Lbs) 152.8 144.3 147  BMI 24.66 kg/m2 23.29 kg/m2 23.73 kg/m2    Physical Exam Vitals and nursing note  reviewed.  Constitutional:      General: She is not in acute distress.    Appearance: Normal appearance.  HENT:     Head: Normocephalic and atraumatic.  Eyes:     General:        Right eye: No discharge.        Left eye: No discharge.     Conjunctiva/sclera: Conjunctivae normal.  Cardiovascular:     Rate and Rhythm: Normal rate and regular rhythm.  Pulmonary:     Effort: Pulmonary effort is normal.     Breath sounds: Normal breath sounds. No wheezing, rhonchi or rales.  Neurological:     Mental Status: She is alert.     Lab Results  Component Value Date   WBC 7.1 11/15/2021   HGB 10.3 (L) 11/15/2021   HCT 31.3 (L) 11/15/2021   PLT 264 11/15/2021   GLUCOSE 118 (H) 11/15/2021   CHOL 146 03/02/2021   TRIG 79 03/02/2021   HDL 58 03/02/2021   LDLCALC 73 03/02/2021   ALT 20 11/15/2021   AST 27 11/15/2021   NA 139 11/15/2021   K 4.1 11/15/2021   CL 108 11/15/2021   CREATININE 1.56 (H) 11/15/2021   BUN 31 (H) 11/15/2021   CO2 24 11/15/2021   HGBA1C 6.5 (H) 02/26/2022     Assessment & Plan:   Problem List Items Addressed This Visit       Cardiovascular and Mediastinum   Essential hypertension, benign    Well controlled. Continue losartan, HCTZ, and diltiazem.        Endocrine   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) - Primary    A1c at goal.  Continue Amaryl.      Relevant Orders   Hemoglobin A1c (Completed)     Other   Hyperlipidemia    Stable. Continue Lipitor.       Other Visit Diagnoses     Need for vaccination       Relevant Orders   Flu Vaccine QUAD High Dose(Fluad) (Completed)      Follow-up:  6 months  Stacy Webb

## 2022-03-01 NOTE — Assessment & Plan Note (Signed)
Well controlled. Continue losartan, HCTZ, and diltiazem.

## 2022-03-01 NOTE — Assessment & Plan Note (Signed)
A1c at goal.  Continue Amaryl.

## 2022-03-01 NOTE — Assessment & Plan Note (Signed)
Stable. Continue Lipitor. 

## 2022-03-04 ENCOUNTER — Encounter: Payer: Self-pay | Admitting: *Deleted

## 2022-03-20 ENCOUNTER — Other Ambulatory Visit: Payer: Self-pay | Admitting: Family Medicine

## 2022-03-20 ENCOUNTER — Telehealth: Payer: Self-pay | Admitting: Family Medicine

## 2022-03-20 MED ORDER — PROMETHAZINE-DM 6.25-15 MG/5ML PO SYRP: 5.0000 mL | ORAL_SOLUTION | Freq: Four times a day (QID) | ORAL | 0 refills | Status: DC | PRN

## 2022-03-20 NOTE — Telephone Encounter (Signed)
Pt contacted and verbalized understanding.  

## 2022-03-20 NOTE — Telephone Encounter (Signed)
Pt called into office requesting cough syrup. Pt states she was seen a few weeks ago (seen 02/26/22 for DM). Pt states her cough is causing some shortness of breath and had some wheezing this morning. Pt would like cough syrup sent to [facility]. Please advise. Thank you.

## 2022-03-28 ENCOUNTER — Encounter: Payer: Medicare Other | Admitting: Orthopedic Surgery

## 2022-04-03 LAB — HM DIABETES EYE EXAM

## 2022-04-09 ENCOUNTER — Telehealth: Payer: Self-pay | Admitting: Orthopedic Surgery

## 2022-04-09 NOTE — Telephone Encounter (Signed)
Patient lvm requesting an appointment for today to get an injection in her shoulder.  She stated she hurt it when she was pulling something out of the ground.  Please advise if I can add her today or not. Pt's # [phone]

## 2022-04-10 ENCOUNTER — Ambulatory Visit (HOSPITAL_COMMUNITY)
Admission: RE | Admit: 2022-04-10 | Discharge: 2022-04-10 | Disposition: A | Discharge status: Home / Self Care | Payer: Medicare Other | Source: Ambulatory Visit | Attending: Family Medicine | Admitting: Family Medicine

## 2022-04-10 ENCOUNTER — Ambulatory Visit (INDEPENDENT_AMBULATORY_CARE_PROVIDER_SITE_OTHER): Payer: Medicare Other | Admitting: Family Medicine

## 2022-04-10 ENCOUNTER — Encounter: Payer: Self-pay | Admitting: Family Medicine

## 2022-04-10 VITALS — BP 136/70 | HR 68 | Temp 97.5°F | Wt 147.8 lb

## 2022-04-10 DIAGNOSIS — R0602 Shortness of breath: Secondary | ICD-10-CM | POA: Diagnosis not present

## 2022-04-10 DIAGNOSIS — R059 Cough, unspecified: Secondary | ICD-10-CM | POA: Diagnosis not present

## 2022-04-10 NOTE — Patient Instructions (Signed)
Xray today.  We will call with the results.  Take care  Dr. Lacinda Axon

## 2022-04-10 NOTE — Assessment & Plan Note (Signed)
Given recent cough and other symptoms, chest xray was obtained.  Chest xray was independently reviewed by me. Interpretation: Chest xray negative for acute cardiopulmonary abnormality. I feel that patients feeling of difficulty taking a deep breath is secondary to anxiety/stress.

## 2022-04-10 NOTE — Progress Notes (Signed)
Subjective:  Patient ID: Stacy Webb, female    DOB: 10-28-1945  Age: 77 y.o. MRN: [id number]  CC: Chief Complaint  Patient presents with   Cough    Pt arrives due to having shortness of breath and cough for about 4 weeks. Cough med called in on 03/20/22 but made mouth feel like sand. Can breathe ok but sometimes gets short of breath and needs a deep breath but unable to take deep breath. Also had some wheezing     HPI:  77 year old female presents for evaluation of the above.  Developed cough approximately 4 weeks ago. I sent in cough medication for her at her request. Cough has improved but she has been feeling like she can't get a good deep breath. Has had some intermittent wheezing. No fever.   Of note, patient reports ongoing stress. She is taking care of her daughter and this has been very stressful.   Patient Active Problem List   Diagnosis Date Noted   Cough 04/10/2022   Type 2 diabetes with kidney complications ([place]) 70/26/3785   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[place])    Breast cancer of upper-outer quadrant of left female breast ([place]) 11/12/2021   Cholelithiasis 10/29/2020   Rotator cuff arthropathy of left shoulder 07/10/2020   CKD (chronic kidney disease) stage 3, GFR 30-59 ml/min ([place]) 09/27/2016   Hyperlipidemia 09/27/2016   Essential hypertension, benign 08/11/2012    Social Hx   Social History   Socioeconomic History   Marital status: Widowed    Spouse name: Not on file   Number of children: 3   Years of education: Not on file   Highest education level: Not on file  Occupational History   Occupation: [employer]    Comment: Medical Records  Tobacco Use   Smoking status: Never   Smokeless tobacco: Never  Vaping Use   Vaping Use: Never used  Substance and Sexual Activity   Alcohol use: No   Drug use: No   Sexual activity: Not Currently    Birth control/protection: Post-menopausal  Other Topics Concern   Not on file  Social History Narrative    Husband had ALS and passed away.Twin boys, but one died at 9 months and her other son died in 2014-07-19.      Living daughter- lives with pt.   Social Determinants of Health   Financial Resource Strain: Low Risk  (10/02/2021)   Overall Financial Resource Strain (CARDIA)    Difficulty of Paying Living Expenses: Not hard at all  Food Insecurity: No Food Insecurity (10/02/2021)   Hunger Vital Sign    Worried About Running Out of Food in the Last Year: Never true    Ran Out of Food in the Last Year: Never true  Transportation Needs: No Transportation Needs (10/02/2021)   PRAPARE - Hydrologist (Medical): No    Lack of Transportation (Non-Medical): No  Physical Activity: Insufficiently Active (10/02/2021)   Exercise Vital Sign    Days of Exercise per Week: 2 days    Minutes of Exercise per Session: 60 min  Stress: No Stress Concern Present (10/02/2021)   [place]    Feeling of Stress : Not at all  Social Connections: [place] (10/02/2021)   Social Connection and Isolation Panel [NHANES]    Frequency of Communication with Friends and Family: More than three times a week    Frequency of Social Gatherings with  Friends and Family: More than three times a week    Attends Religious Services: More than 4 times per year    Active Member of Clubs or Organizations: Yes    Attends Music therapist: More than 4 times per year    Marital Status: Married    Review of Systems Per HPI  Objective:  BP 136/70   Pulse 68   Temp (!) 97.5 F (36.4 C)   Wt 147 lb 12.8 oz (67 kg)   SpO2 99%   BMI 23.86 kg/m      04/10/2022   11:28 AM 02/26/2022    9:49 AM 01/09/2022    2:58 PM  BP/Weight  Systolic BP 283 662 947  Diastolic BP 70 87 80  Wt. (Lbs) 147.8 152.8 144.3  BMI 23.86 kg/m2 24.66 kg/m2 23.29 kg/m2    Physical Exam Constitutional:      General: She is not in acute  distress.    Appearance: Normal appearance. She is not ill-appearing.  Eyes:     General:        Right eye: No discharge.        Left eye: No discharge.     Conjunctiva/sclera: Conjunctivae normal.  Cardiovascular:     Rate and Rhythm: Normal rate and regular rhythm.  Pulmonary:     Effort: Pulmonary effort is normal.     Breath sounds: Normal breath sounds. No wheezing or rales.  Neurological:     Mental Status: She is alert.  Psychiatric:     Comments: Tearful.      Lab Results  Component Value Date   WBC 7.1 11/15/2021   HGB 10.3 (L) 11/15/2021   HCT 31.3 (L) 11/15/2021   PLT 264 11/15/2021   GLUCOSE 118 (H) 11/15/2021   CHOL 146 03/02/2021   TRIG 79 03/02/2021   HDL 58 03/02/2021   LDLCALC 73 03/02/2021   ALT 20 11/15/2021   AST 27 11/15/2021   NA 139 11/15/2021   K 4.1 11/15/2021   CL 108 11/15/2021   CREATININE 1.56 (H) 11/15/2021   BUN 31 (H) 11/15/2021   CO2 24 11/15/2021   HGBA1C 6.5 (H) 02/26/2022     Assessment & Plan:   Problem List Items Addressed This Visit       Other   Cough - Primary    Given recent cough and other symptoms, chest xray was obtained.  Chest xray was independently reviewed by me. Interpretation: Chest xray negative for acute cardiopulmonary abnormality. I feel that patients feeling of difficulty taking a deep breath is secondary to anxiety/stress.       Relevant Orders   DG Chest 2 View (Completed)    Naylani Bradner Lacinda Axon DO [place]

## 2022-04-11 ENCOUNTER — Other Ambulatory Visit: Payer: Self-pay | Admitting: *Deleted

## 2022-04-11 MED ORDER — ACCU-CHEK GUIDE VI STRP: ORAL_STRIP | 5 refills | Status: DC

## 2022-04-11 NOTE — Progress Notes (Signed)
Patient called to request refill of testing strips for glucose but stated she has new machine and will call back with what she needs

## 2022-04-12 ENCOUNTER — Ambulatory Visit (INDEPENDENT_AMBULATORY_CARE_PROVIDER_SITE_OTHER): Payer: Medicare Other | Admitting: Orthopedic Surgery

## 2022-04-12 ENCOUNTER — Encounter: Payer: Self-pay | Admitting: Orthopedic Surgery

## 2022-04-12 ENCOUNTER — Ambulatory Visit (INDEPENDENT_AMBULATORY_CARE_PROVIDER_SITE_OTHER): Payer: Medicare Other

## 2022-04-12 VITALS — Ht 66.0 in | Wt 147.0 lb

## 2022-04-12 DIAGNOSIS — M12811 Other specific arthropathies, not elsewhere classified, right shoulder: Secondary | ICD-10-CM | POA: Diagnosis not present

## 2022-04-12 DIAGNOSIS — M25511 Pain in right shoulder: Secondary | ICD-10-CM

## 2022-04-12 DIAGNOSIS — M1712 Unilateral primary osteoarthritis, left knee: Secondary | ICD-10-CM | POA: Diagnosis not present

## 2022-04-12 MED ORDER — METHYLPREDNISOLONE ACETATE 40 MG/ML IJ SUSP
40.0000 mg | Freq: Once | INTRAMUSCULAR | Status: AC
  Administered 2022-04-12: 40 mg via INTRA_ARTICULAR

## 2022-04-12 NOTE — Progress Notes (Signed)
Orthopaedic Clinic Return  Assessment: Stacy Webb is a 77 y.o. 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  Onset was approximately 1 week ago.  However, her pain is now better.  She has decent range of motion.  We reviewed the radiographs obtained in clinic today.  I explained the problem to her.  She is aware as she has undergone left shoulder reverse arthroplasty.  As her symptoms have improved, she is not interested in an injection at this time.  However, she does continue to have pain in the left knee.  She also has known arthritis in the left knee.  She has not had an injection recently.  She is interested in proceeding.  This was completed without issues.  Procedure note injection Left knee joint   Verbal consent was obtained to inject the left knee joint  Timeout was completed to confirm the site of injection.  The skin was prepped with alcohol and ethyl chloride was sprayed at the injection site.  A 21-gauge needle was used to inject 40 mg of Depo-Medrol and 1% lidocaine (3 cc) into the left knee using an anterolateral approach.  There were no complications. A sterile bandage was applied.     Follow-up: Return if symptoms worsen or fail to improve.   Subjective:  Chief Complaint  Patient presents with   Shoulder Pain    Rt shoulder pain    History of Present Illness: Stacy Webb is a 77 y.o. female who returns to clinic for evaluation of shoulder pain.  She is well-known to my clinic.  Her left shoulder continues to do well.  Approximate 1 week ago, she was lifting something, when she noted sharp pain in her right shoulder.  She scheduled the appointment.  Since then, her pain is improved.  It is not that severe right now.  However, she continues to have severe pain in the left knee.  She notes radiating pains distally in her left  shin.  I saw her for this complaint approximately 18 months ago.  No injection at that time.  She is interested in an injection in her left knee today.  Review of Systems: No fevers or chills No numbness or tingling No chest pain No shortness of breath No bowel or bladder dysfunction No GI distress No headaches   Objective: Ht '5\' 6"'$  (1.676 m)   Wt 147 lb (66.7 kg)   BMI 23.73 kg/m   Physical Exam:  Alert and oriented.  No acute distress.  Evaluation of the right shoulder demonstrates no obvious deformity.  She has near full range of motion.  Mild discomfort with range of motion testing.  Good strength in the right shoulder.  Evaluation of left knee demonstrates mild varus alignment. Prominent medial femoral condyle.  Tenderness palpation medially.  No increased laxity varus or valgus stress.    IMAGING: I personally ordered and reviewed the following images:  X-rays right shoulder were obtained in clinic today.  No acute injuries are noted.  Chronic rotator cuff injury, with proximal humeral migration.  The humeral head is abutting the undersurface of the acromion.  This is causing sclerosis in this area.  Patient: Right shoulder rotator cuff arthropathy  [name], MD 04/12/2022 12:32 PM

## 2022-04-12 NOTE — Patient Instructions (Signed)

## 2022-04-15 ENCOUNTER — Ambulatory Visit: Payer: Medicare Other | Admitting: *Deleted

## 2022-04-15 DIAGNOSIS — E1140 Type 2 diabetes mellitus with diabetic neuropathy, unspecified: Secondary | ICD-10-CM

## 2022-04-15 DIAGNOSIS — M2042 Other hammer toe(s) (acquired), left foot: Secondary | ICD-10-CM

## 2022-04-15 DIAGNOSIS — M2041 Other hammer toe(s) (acquired), right foot: Secondary | ICD-10-CM

## 2022-04-15 NOTE — Progress Notes (Unsigned)
Patient presents to the office today for diabetic shoe and insole measuring.  Patient requires custom shoes and she will follow up with Dr Caryl Comes on Thursday to discuss going to [facility].  Patient's diabetic provider: Coral Spikes, DO

## 2022-04-18 DIAGNOSIS — I739 Peripheral vascular disease, unspecified: Secondary | ICD-10-CM | POA: Diagnosis not present

## 2022-04-18 DIAGNOSIS — M79671 Pain in right foot: Secondary | ICD-10-CM | POA: Diagnosis not present

## 2022-04-18 DIAGNOSIS — L110 Acquired keratosis follicularis: Secondary | ICD-10-CM | POA: Diagnosis not present

## 2022-04-18 DIAGNOSIS — M79672 Pain in left foot: Secondary | ICD-10-CM | POA: Diagnosis not present

## 2022-04-18 DIAGNOSIS — E1140 Type 2 diabetes mellitus with diabetic neuropathy, unspecified: Secondary | ICD-10-CM | POA: Diagnosis not present

## 2022-04-24 ENCOUNTER — Encounter: Payer: Self-pay | Admitting: *Deleted

## 2022-04-30 ENCOUNTER — Other Ambulatory Visit: Payer: Self-pay | Admitting: Family Medicine

## 2022-05-02 DIAGNOSIS — L821 Other seborrheic keratosis: Secondary | ICD-10-CM | POA: Diagnosis not present

## 2022-05-02 DIAGNOSIS — X32XXXD Exposure to sunlight, subsequent encounter: Secondary | ICD-10-CM | POA: Diagnosis not present

## 2022-05-02 DIAGNOSIS — L570 Actinic keratosis: Secondary | ICD-10-CM | POA: Diagnosis not present

## 2022-05-03 ENCOUNTER — Encounter: Payer: Self-pay | Admitting: Family Medicine

## 2022-05-03 ENCOUNTER — Ambulatory Visit (INDEPENDENT_AMBULATORY_CARE_PROVIDER_SITE_OTHER): Payer: Medicare Other | Admitting: Family Medicine

## 2022-05-03 VITALS — BP 120/74 | HR 82 | Temp 97.6°F | Wt 150.0 lb

## 2022-05-03 DIAGNOSIS — N1832 Chronic kidney disease, stage 3b: Secondary | ICD-10-CM

## 2022-05-03 DIAGNOSIS — E1122 Type 2 diabetes mellitus with diabetic chronic kidney disease: Secondary | ICD-10-CM | POA: Diagnosis not present

## 2022-05-03 DIAGNOSIS — E11610 Type 2 diabetes mellitus with diabetic neuropathic arthropathy: Secondary | ICD-10-CM

## 2022-05-03 DIAGNOSIS — I10 Essential (primary) hypertension: Secondary | ICD-10-CM | POA: Diagnosis not present

## 2022-05-03 DIAGNOSIS — M62838 Other muscle spasm: Secondary | ICD-10-CM | POA: Diagnosis not present

## 2022-05-03 MED ORDER — GLIMEPIRIDE 1 MG PO TABS: 1.0000 mg | ORAL_TABLET | Freq: Every day | ORAL | 0 refills | Status: DC

## 2022-05-03 MED ORDER — BACLOFEN 10 MG PO TABS: 10.0000 mg | ORAL_TABLET | Freq: Three times a day (TID) | ORAL | 0 refills | Status: DC | PRN

## 2022-05-03 MED ORDER — GLIMEPIRIDE 1 MG PO TABS: 1.0000 mg | ORAL_TABLET | Freq: Every day | ORAL | 3 refills | Status: DC

## 2022-05-03 NOTE — Progress Notes (Signed)
Subjective:  Patient ID: Stacy Webb, female    DOB: 02/13/1946  Age: 77 y.o. MRN: [id number]  CC: Chief Complaint  Patient presents with   Diabetes    Pt arrives to have form filled out to go to [facility]. Pt has to wear specialized shoes. Needing short term of Glipermide sent to [facility] script to reflect PCP to [facility] Pt still having lingering cough.     HPI:  77 year old female presents for follow-up.  Diabetes is well-controlled.  Most recent A1c 6.5 on 02/26/2022.  She is compliant with low-dose Amaryl.  Patient has Charcot foot (right foot).  She has severe neuropathy as well.  She requires custom shoes and inserts to aid in ambulation and prevent skin breakdown.  She needs an order and forms to be filled out so that she can get new shoes and inserts.  Additionally, patient reports recent spasms of her feet and toes particularly at night and early in the morning.  She has tried over-the-counter remedies but has not had significant improvement.  Patient Active Problem List   Diagnosis Date Noted   Muscle spasms of lower extremity 05/03/2022   Cough 04/10/2022   Type 2 diabetes with kidney complications ([place]) 55/73/2202   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[place])    Breast cancer of upper-outer quadrant of left female breast ([place]) 11/12/2021   Cholelithiasis 10/29/2020   Rotator cuff arthropathy of left shoulder 07/10/2020   CKD (chronic kidney disease) stage 3, GFR 30-59 ml/min ([place]) 09/27/2016   Hyperlipidemia 09/27/2016   Essential hypertension, benign 08/11/2012    Social Hx   Social History   Socioeconomic History   Marital status: Widowed    Spouse name: Not on file   Number of children: 3   Years of education: Not on file   Highest education level: Not on file  Occupational History   Occupation: [employer]    Comment: Medical Records  Tobacco Use   Smoking status: Never   Smokeless tobacco: Never  Vaping Use   Vaping Use: Never  used  Substance and Sexual Activity   Alcohol use: No   Drug use: No   Sexual activity: Not Currently    Birth control/protection: Post-menopausal  Other Topics Concern   Not on file  Social History Narrative   Husband had ALS and passed away.Twin boys, but one died at 38 months and her other son died in 07-08-14.      Living daughter- lives with pt.   Social Determinants of Health   Financial Resource Strain: Low Risk  (10/02/2021)   Overall Financial Resource Strain (CARDIA)    Difficulty of Paying Living Expenses: Not hard at all  Food Insecurity: No Food Insecurity (10/02/2021)   Hunger Vital Sign    Worried About Running Out of Food in the Last Year: Never true    Ran Out of Food in the Last Year: Never true  Transportation Needs: No Transportation Needs (10/02/2021)   PRAPARE - Hydrologist (Medical): No    Lack of Transportation (Non-Medical): No  Physical Activity: Insufficiently Active (10/02/2021)   Exercise Vital Sign    Days of Exercise per Week: 2 days    Minutes of Exercise per Session: 60 min  Stress: No Stress Concern Present (10/02/2021)   [place]    Feeling of Stress : Not at all  Social Connections: [place] (10/02/2021)   Social Connection  and Isolation Panel [NHANES]    Frequency of Communication with Friends and Family: More than three times a week    Frequency of Social Gatherings with Friends and Family: More than three times a week    Attends Religious Services: More than 4 times per year    Active Member of Clubs or Organizations: Yes    Attends Music therapist: More than 4 times per year    Marital Status: Married    Review of Systems Per HPI  Objective:  BP 120/74   Pulse 82   Temp 97.6 F (36.4 C)   Wt 150 lb (68 kg)   SpO2 99%   BMI 24.21 kg/m      05/03/2022    9:21 AM 04/12/2022   11:57 AM 04/10/2022   11:28 AM   BP/Weight  Systolic BP 413  244  Diastolic BP 74  70  Wt. (Lbs) 150 147 147.8  BMI 24.21 kg/m2 23.73 kg/m2 23.86 kg/m2    Physical Exam Constitutional:      General: She is not in acute distress.    Appearance: Normal appearance.  HENT:     Head: Normocephalic and atraumatic.  Cardiovascular:     Rate and Rhythm: Normal rate and regular rhythm.  Pulmonary:     Effort: Pulmonary effort is normal.     Breath sounds: Normal breath sounds.  Musculoskeletal:     Comments: Right foot with significant deformity consistent with Charcot foot.  Peripheral neuropathy with essentially absent sensation to monofilament.  Diminished sensation of the left foot to monofilament testing.  Neurological:     Mental Status: She is alert.  Psychiatric:        Mood and Affect: Mood normal.        Behavior: Behavior normal.     Lab Results  Component Value Date   WBC 7.1 11/15/2021   HGB 10.3 (L) 11/15/2021   HCT 31.3 (L) 11/15/2021   PLT 264 11/15/2021   GLUCOSE 118 (H) 11/15/2021   CHOL 146 03/02/2021   TRIG 79 03/02/2021   HDL 58 03/02/2021   LDLCALC 73 03/02/2021   ALT 20 11/15/2021   AST 27 11/15/2021   NA 139 11/15/2021   K 4.1 11/15/2021   CL 108 11/15/2021   CREATININE 1.56 (H) 11/15/2021   BUN 31 (H) 11/15/2021   CO2 24 11/15/2021   HGBA1C 6.5 (H) 02/26/2022     Assessment & Plan:   Problem List Items Addressed This Visit       Cardiovascular and Mediastinum   Essential hypertension, benign    Hypertension stable on losartan and HCTZ.        Endocrine   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[place])    Form filled out so that she can get shoes and inserts.  Will fax today.      Relevant Medications   glimepiride (AMARYL) 1 MG tablet   baclofen (LIORESAL) 10 MG tablet   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) - Primary    Stable.  A1c at goal.  Continue Amaryl.  Refilled today.      Relevant Medications   glimepiride (AMARYL) 1 MG tablet     Other    Muscle spasms of lower extremity    Baclofen as directed.       Meds ordered this encounter  Medications   DISCONTD: glimepiride (AMARYL) 1 MG tablet    Sig: Take 1 tablet (1 mg total) by mouth daily with breakfast.  Dispense:  15 tablet    Refill:  0   glimepiride (AMARYL) 1 MG tablet    Sig: Take 1 tablet (1 mg total) by mouth daily with breakfast.    Dispense:  90 tablet    Refill:  3   baclofen (LIORESAL) 10 MG tablet    Sig: Take 1 tablet (10 mg total) by mouth 3 (three) times daily as needed for muscle spasms.    Dispense:  30 each    Refill:  0    Follow-up:  Has follow up in July  Linas Stepter DO [facility]

## 2022-05-03 NOTE — Addendum Note (Signed)
Addended by: Coral Spikes on: 05/03/2022 09:50 AM   Modules accepted: Orders

## 2022-05-03 NOTE — Patient Instructions (Signed)
Medication as directed.  We will fax over the forms for your shoes today.  Take care  Dr. Lacinda Axon

## 2022-05-03 NOTE — Assessment & Plan Note (Signed)
Hypertension stable on losartan and HCTZ.

## 2022-05-03 NOTE — Assessment & Plan Note (Signed)
Stable.  A1c at goal.  Continue Amaryl.  Refilled today.

## 2022-05-03 NOTE — Assessment & Plan Note (Signed)
Baclofen as directed.

## 2022-05-03 NOTE — Assessment & Plan Note (Signed)
Form filled out so that she can get shoes and inserts.  Will fax today.

## 2022-05-07 ENCOUNTER — Telehealth: Payer: Self-pay | Admitting: Family Medicine

## 2022-05-07 DIAGNOSIS — N189 Chronic kidney disease, unspecified: Secondary | ICD-10-CM | POA: Diagnosis not present

## 2022-05-07 DIAGNOSIS — D631 Anemia in chronic kidney disease: Secondary | ICD-10-CM | POA: Diagnosis not present

## 2022-05-07 DIAGNOSIS — N170 Acute kidney failure with tubular necrosis: Secondary | ICD-10-CM | POA: Diagnosis not present

## 2022-05-07 DIAGNOSIS — I129 Hypertensive chronic kidney disease with stage 1 through stage 4 chronic kidney disease, or unspecified chronic kidney disease: Secondary | ICD-10-CM | POA: Diagnosis not present

## 2022-05-07 DIAGNOSIS — E1122 Type 2 diabetes mellitus with diabetic chronic kidney disease: Secondary | ICD-10-CM | POA: Diagnosis not present

## 2022-05-10 NOTE — Telephone Encounter (Signed)
Error please close

## 2022-05-12 IMAGING — CT CT HEAD W/O CM
3 series · 15 of 47 positions shown, 18 images · non-contrast
Comparison: Head CT 06/23/2001.

CLINICAL DATA: 74-year-old female status post fall from bed.  Pain.

EXAM:
CT HEAD WITHOUT CONTRAST
TECHNIQUE: Contiguous axial images were obtained from the base of the skull
through the vertex without intravenous contrast.

[Series 2: head w o · axial · 0.39mm/px · z∈[-79,+46]mm · 9 of 31 slices shown, 12 images]
[im 3/31  brain]
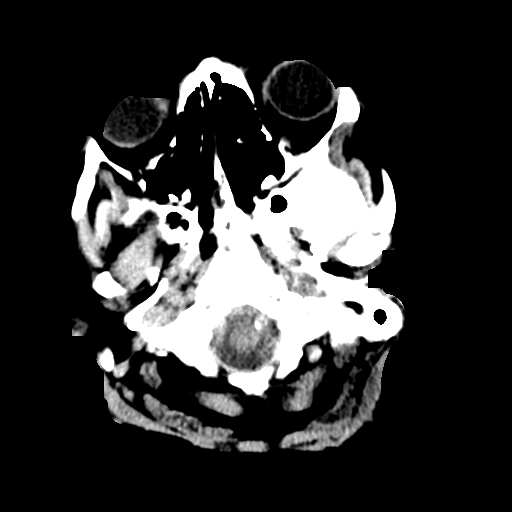
[im 3/31  bone]
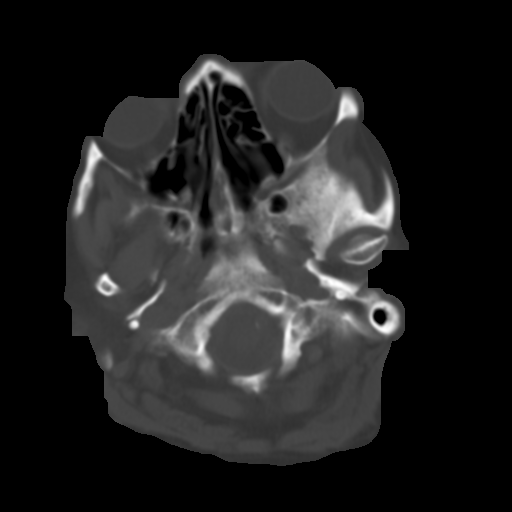
[im 6/31  brain]
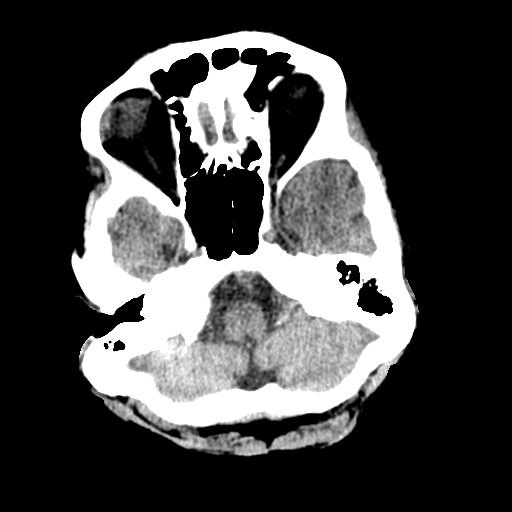
[im 9/31  brain]
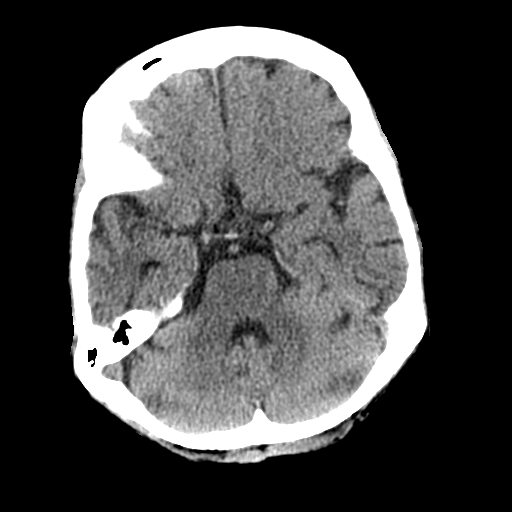
[im 12/31  brain]
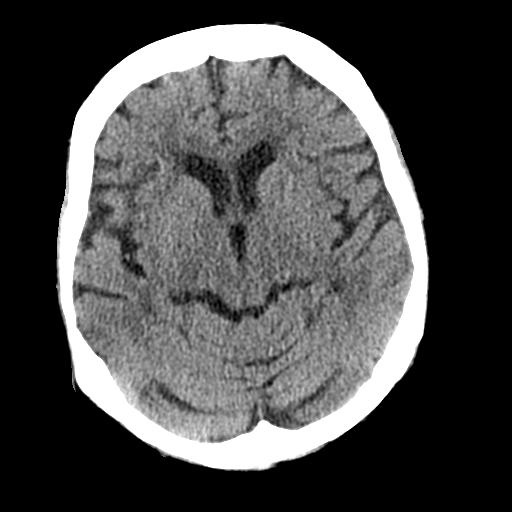
[im 16/31  brain]
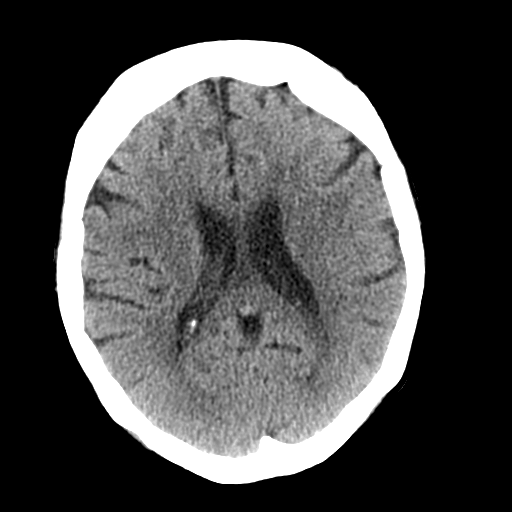
[im 16/31  bone]
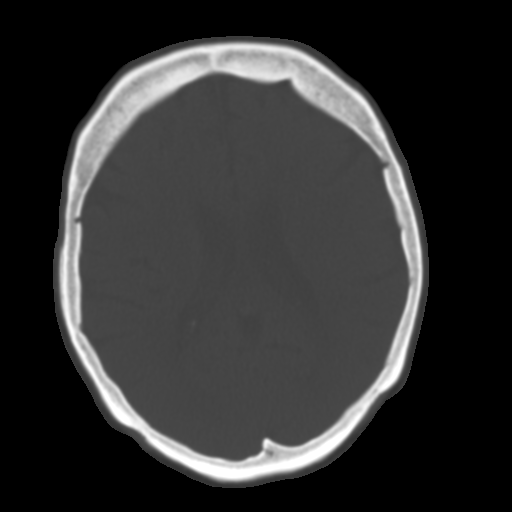
[im 19/31  brain]
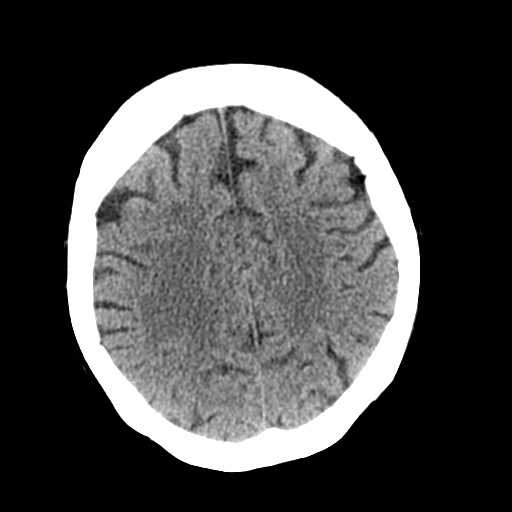
[im 22/31  brain]
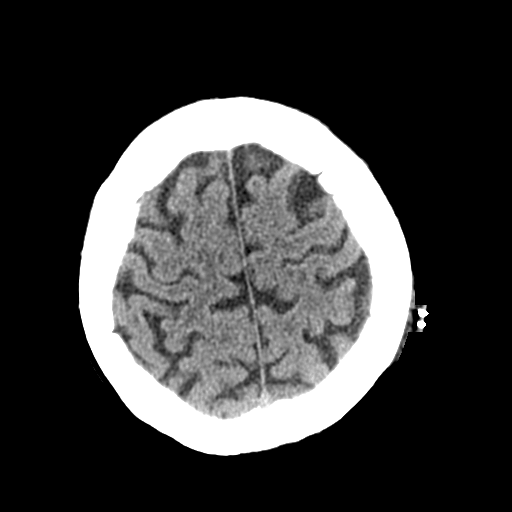
[im 25/31  brain]
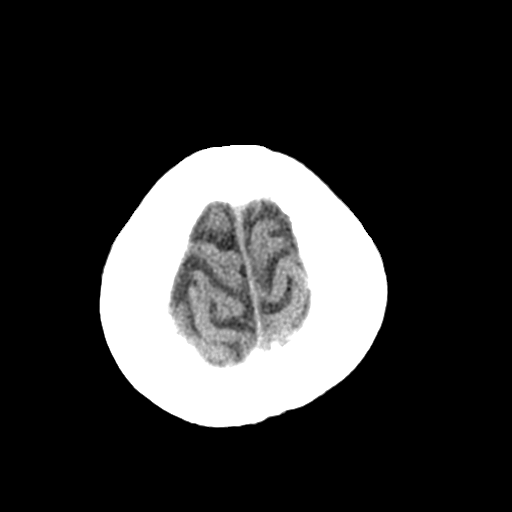
[im 28/31  brain]
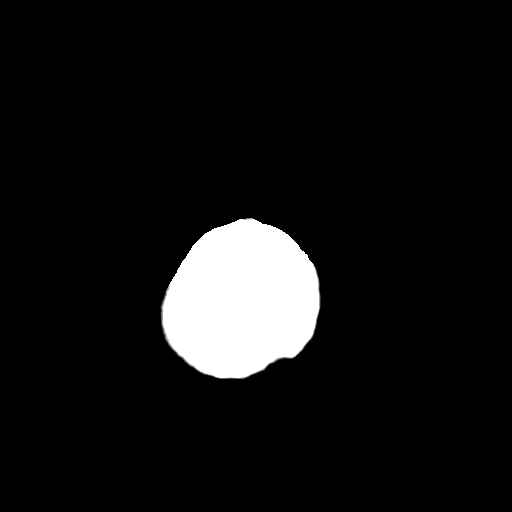
[im 28/31  bone]
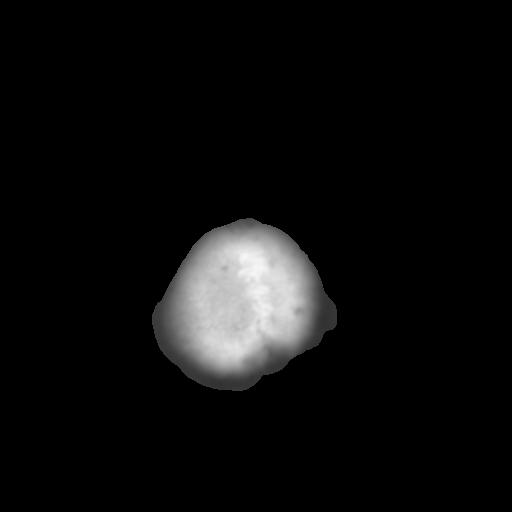

[Series 4: coronal soft · coronal · 0.31mm/px · 3 of 67 slices shown]
[im 23/67  brain]
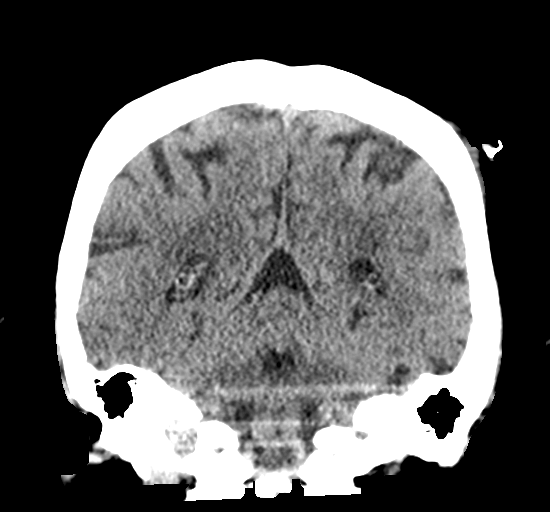
[im 30/67  brain]
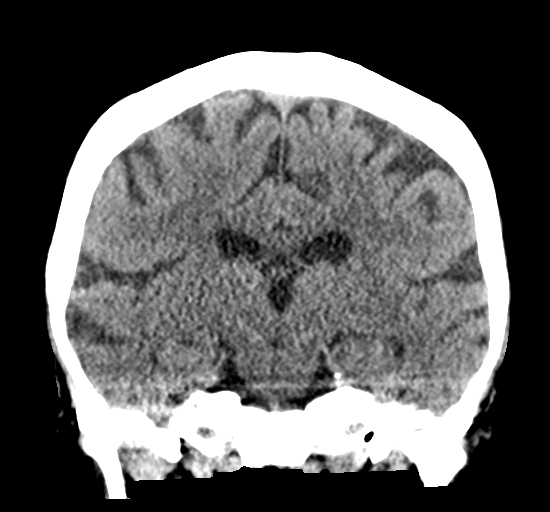
[im 37/67  brain]
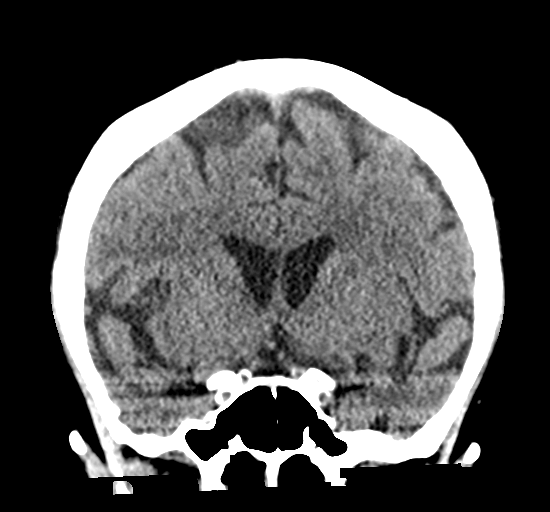

[Series 5: sagittal soft · sagittal · 0.31mm/px · 3 of 55 slices shown]
[im 19/55  brain]
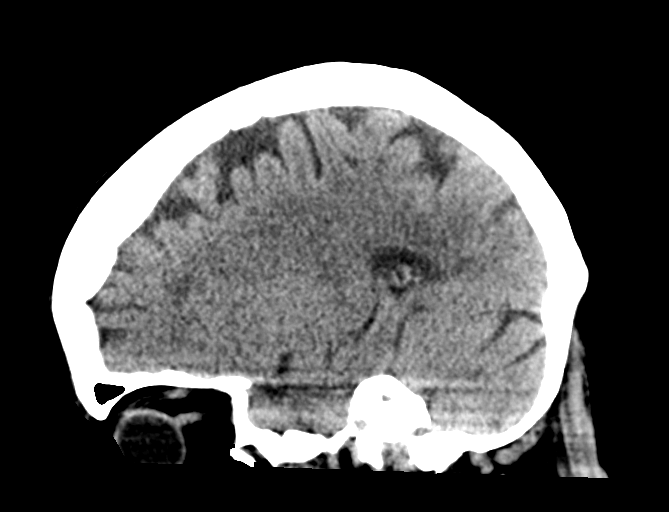
[im 28/55  brain]
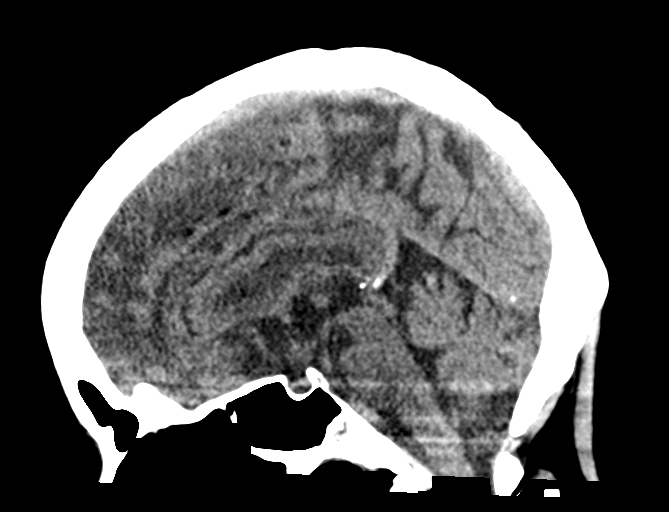
[im 37/55  brain]
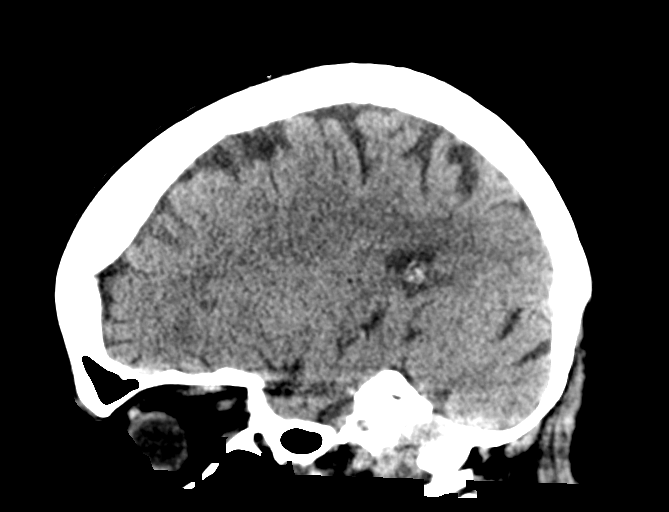

[15 of 47 positions shown; findings below may reference images not displayed]

FINDINGS: Brain: Generalized cerebral volume loss since 6111. Gray-white
matter differentiation remains within normal limits for age. No
midline shift, ventriculomegaly, mass effect, evidence of mass
lesion, intracranial hemorrhage or evidence of cortically based
acute infarction.

Vascular: Calcified atherosclerosis at the skull base. No suspicious
intracranial vascular hyperdensity.

Skull: Stable, intact.

Sinuses/Orbits: Visualized paranasal sinuses and mastoids are stable
and well pneumatized.

Other: Posterior left vertex scalp mild hematoma with overlying
laceration with skin staples. Underlying calvarium intact.

Negative visible orbits soft tissues.
IMPRESSION: 1. Scalp soft tissue injury without underlying skull fracture.
2. No acute intracranial abnormality, negative brain for age.

## 2022-05-12 IMAGING — DX DG FINGER RING 2+V*L*
3 series · 3 of 3 positions shown · non-contrast
Comparison: None.

CLINICAL DATA: Fell and injured left ring finger.

EXAM:
LEFT RING FINGER 2+V

[finger ap]
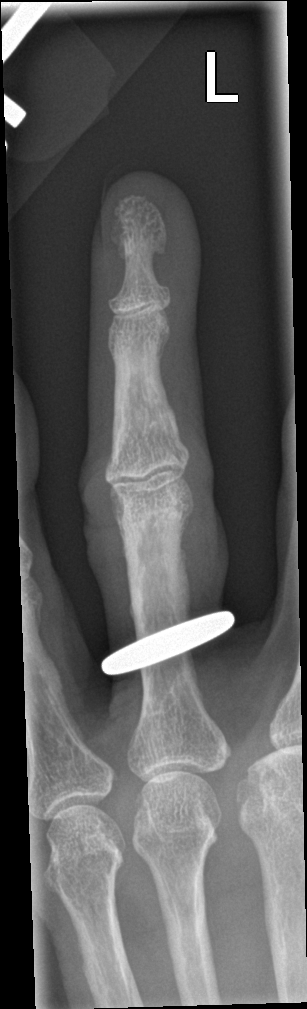

[finger obl]
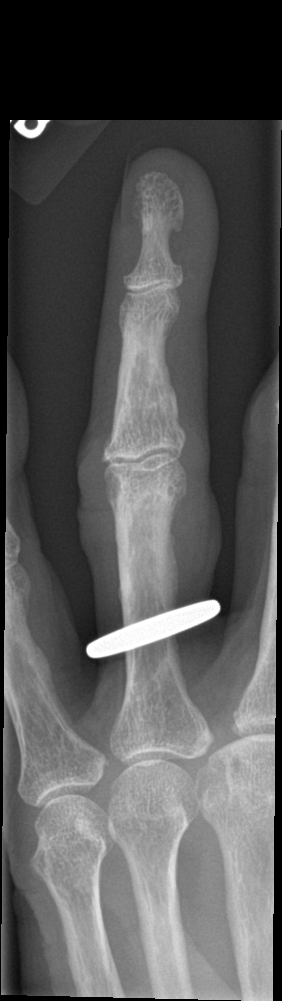

[finger lat]
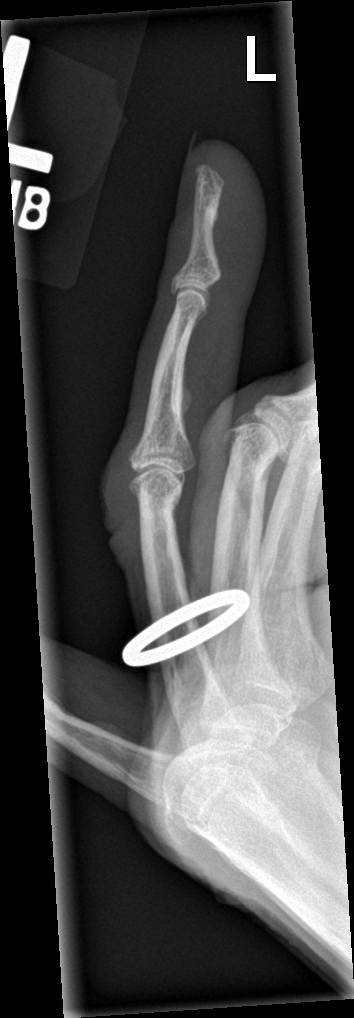

[3 of 3 positions shown; findings below may reference images not displayed]

FINDINGS: Mild to moderate interphalangeal joint degenerative changes. No
acute fracture is identified.
IMPRESSION: Degenerative changes but no acute fracture.

## 2022-05-14 ENCOUNTER — Inpatient Hospital Stay: Payer: Medicare Other | Attending: Hematology

## 2022-05-14 DIAGNOSIS — Z836 Family history of other diseases of the respiratory system: Secondary | ICD-10-CM | POA: Diagnosis not present

## 2022-05-14 DIAGNOSIS — D631 Anemia in chronic kidney disease: Secondary | ICD-10-CM | POA: Insufficient documentation

## 2022-05-14 DIAGNOSIS — E611 Iron deficiency: Secondary | ICD-10-CM | POA: Insufficient documentation

## 2022-05-14 DIAGNOSIS — K5900 Constipation, unspecified: Secondary | ICD-10-CM | POA: Insufficient documentation

## 2022-05-14 DIAGNOSIS — C50412 Malignant neoplasm of upper-outer quadrant of left female breast: Secondary | ICD-10-CM | POA: Diagnosis not present

## 2022-05-14 DIAGNOSIS — E785 Hyperlipidemia, unspecified: Secondary | ICD-10-CM | POA: Insufficient documentation

## 2022-05-14 DIAGNOSIS — Z801 Family history of malignant neoplasm of trachea, bronchus and lung: Secondary | ICD-10-CM | POA: Diagnosis not present

## 2022-05-14 DIAGNOSIS — E1122 Type 2 diabetes mellitus with diabetic chronic kidney disease: Secondary | ICD-10-CM | POA: Diagnosis not present

## 2022-05-14 DIAGNOSIS — R059 Cough, unspecified: Secondary | ICD-10-CM | POA: Diagnosis not present

## 2022-05-14 DIAGNOSIS — N184 Chronic kidney disease, stage 4 (severe): Secondary | ICD-10-CM | POA: Diagnosis not present

## 2022-05-14 DIAGNOSIS — I129 Hypertensive chronic kidney disease with stage 1 through stage 4 chronic kidney disease, or unspecified chronic kidney disease: Secondary | ICD-10-CM | POA: Diagnosis not present

## 2022-05-14 DIAGNOSIS — Z87442 Personal history of urinary calculi: Secondary | ICD-10-CM | POA: Diagnosis not present

## 2022-05-14 DIAGNOSIS — Z800 Family history of malignant neoplasm of digestive organs: Secondary | ICD-10-CM | POA: Insufficient documentation

## 2022-05-14 DIAGNOSIS — Z170 Estrogen receptor positive status [ER+]: Secondary | ICD-10-CM | POA: Diagnosis not present

## 2022-05-14 DIAGNOSIS — Z79811 Long term (current) use of aromatase inhibitors: Secondary | ICD-10-CM | POA: Diagnosis not present

## 2022-05-14 DIAGNOSIS — Z833 Family history of diabetes mellitus: Secondary | ICD-10-CM | POA: Diagnosis not present

## 2022-05-14 DIAGNOSIS — Z79899 Other long term (current) drug therapy: Secondary | ICD-10-CM | POA: Insufficient documentation

## 2022-05-14 DIAGNOSIS — Z818 Family history of other mental and behavioral disorders: Secondary | ICD-10-CM | POA: Diagnosis not present

## 2022-05-14 DIAGNOSIS — Z9049 Acquired absence of other specified parts of digestive tract: Secondary | ICD-10-CM | POA: Diagnosis not present

## 2022-05-14 DIAGNOSIS — Z881 Allergy status to other antibiotic agents status: Secondary | ICD-10-CM | POA: Diagnosis not present

## 2022-05-14 LAB — COMPREHENSIVE METABOLIC PANEL
ALT: 23 U/L (ref 0–44)
AST: 28 U/L (ref 15–41)
Albumin: 3.9 g/dL (ref 3.5–5.0)
Alkaline Phosphatase: 61 U/L (ref 38–126)
Anion gap: 9 (ref 5–15)
BUN: 33 mg/dL — ABNORMAL HIGH (ref 8–23)
CO2: 24 mmol/L (ref 22–32)
Calcium: 8.8 mg/dL — ABNORMAL LOW (ref 8.9–10.3)
Chloride: 104 mmol/L (ref 98–111)
Creatinine, Ser: 1.73 mg/dL — ABNORMAL HIGH (ref 0.44–1.00)
GFR, Estimated: 30 mL/min — ABNORMAL LOW (ref >60)
Glucose, Bld: 99 mg/dL (ref 70–99)
Potassium: 3.9 mmol/L (ref 3.5–5.1)
Sodium: 137 mmol/L (ref 135–145)
Total Bilirubin: 0.4 mg/dL (ref 0.3–1.2)
Total Protein: 6.8 g/dL (ref 6.5–8.1)

## 2022-05-14 LAB — CBC WITH DIFFERENTIAL/PLATELET
Abs Immature Granulocytes: 0.03 10*3/uL (ref 0.00–0.07)
Basophils Absolute: 0.1 10*3/uL (ref 0.0–0.1)
Basophils Relative: 1 %
Eosinophils Absolute: 0.2 10*3/uL (ref 0.0–0.5)
Eosinophils Relative: 3 %
HCT: 29.7 % — ABNORMAL LOW (ref 36.0–46.0)
Hemoglobin: 9.6 g/dL — ABNORMAL LOW (ref 12.0–15.0)
Immature Granulocytes: 0 %
Lymphocytes Relative: 31 %
Lymphs Abs: 2.2 10*3/uL (ref 0.7–4.0)
MCH: 27.3 pg (ref 26.0–34.0)
MCHC: 32.3 g/dL (ref 30.0–36.0)
MCV: 84.4 fL (ref 80.0–100.0)
Monocytes Absolute: 1 10*3/uL (ref 0.1–1.0)
Monocytes Relative: 14 %
Neutro Abs: 3.6 10*3/uL (ref 1.7–7.7)
Neutrophils Relative %: 51 %
Platelets: 241 10*3/uL (ref 150–400)
RBC: 3.52 MIL/uL — ABNORMAL LOW (ref 3.87–5.11)
RDW: 15.9 % — ABNORMAL HIGH (ref 11.5–15.5)
WBC: 7.1 10*3/uL (ref 4.0–10.5)
nRBC: 0 % (ref 0.0–0.2)

## 2022-05-15 LAB — MISC LABCORP TEST (SEND OUT): Labcorp test code: 81950

## 2022-05-21 ENCOUNTER — Inpatient Hospital Stay (HOSPITAL_BASED_OUTPATIENT_CLINIC_OR_DEPARTMENT_OTHER): Payer: Medicare Other | Admitting: Hematology

## 2022-05-21 VITALS — BP 122/70 | HR 72 | Temp 98.5°F | Resp 18 | Ht 66.0 in | Wt 150.0 lb

## 2022-05-21 DIAGNOSIS — Z800 Family history of malignant neoplasm of digestive organs: Secondary | ICD-10-CM | POA: Diagnosis not present

## 2022-05-21 DIAGNOSIS — D631 Anemia in chronic kidney disease: Secondary | ICD-10-CM | POA: Diagnosis not present

## 2022-05-21 DIAGNOSIS — C50412 Malignant neoplasm of upper-outer quadrant of left female breast: Secondary | ICD-10-CM

## 2022-05-21 DIAGNOSIS — Z833 Family history of diabetes mellitus: Secondary | ICD-10-CM | POA: Diagnosis not present

## 2022-05-21 DIAGNOSIS — E1122 Type 2 diabetes mellitus with diabetic chronic kidney disease: Secondary | ICD-10-CM | POA: Diagnosis not present

## 2022-05-21 DIAGNOSIS — E785 Hyperlipidemia, unspecified: Secondary | ICD-10-CM | POA: Diagnosis not present

## 2022-05-21 DIAGNOSIS — Z79899 Other long term (current) drug therapy: Secondary | ICD-10-CM | POA: Diagnosis not present

## 2022-05-21 DIAGNOSIS — Z801 Family history of malignant neoplasm of trachea, bronchus and lung: Secondary | ICD-10-CM | POA: Diagnosis not present

## 2022-05-21 DIAGNOSIS — Z9049 Acquired absence of other specified parts of digestive tract: Secondary | ICD-10-CM | POA: Diagnosis not present

## 2022-05-21 DIAGNOSIS — K5900 Constipation, unspecified: Secondary | ICD-10-CM | POA: Diagnosis not present

## 2022-05-21 DIAGNOSIS — E611 Iron deficiency: Secondary | ICD-10-CM | POA: Diagnosis not present

## 2022-05-21 DIAGNOSIS — Z881 Allergy status to other antibiotic agents status: Secondary | ICD-10-CM | POA: Diagnosis not present

## 2022-05-21 DIAGNOSIS — Z87442 Personal history of urinary calculi: Secondary | ICD-10-CM | POA: Diagnosis not present

## 2022-05-21 DIAGNOSIS — N184 Chronic kidney disease, stage 4 (severe): Secondary | ICD-10-CM | POA: Diagnosis not present

## 2022-05-21 DIAGNOSIS — Z170 Estrogen receptor positive status [ER+]: Secondary | ICD-10-CM | POA: Diagnosis not present

## 2022-05-21 DIAGNOSIS — Z79811 Long term (current) use of aromatase inhibitors: Secondary | ICD-10-CM | POA: Diagnosis not present

## 2022-05-21 DIAGNOSIS — I129 Hypertensive chronic kidney disease with stage 1 through stage 4 chronic kidney disease, or unspecified chronic kidney disease: Secondary | ICD-10-CM | POA: Diagnosis not present

## 2022-05-21 DIAGNOSIS — R059 Cough, unspecified: Secondary | ICD-10-CM | POA: Diagnosis not present

## 2022-05-21 DIAGNOSIS — Z836 Family history of other diseases of the respiratory system: Secondary | ICD-10-CM | POA: Diagnosis not present

## 2022-05-21 NOTE — Patient Instructions (Addendum)
[facility]  Discharge Instructions  You were seen and examined today by Dr. Delton Coombes.  Dr. Delton Coombes discussed your most recent lab work which revealed that everything looks good and stable your creatinine is slightly elevated.  Start taking half a tablet of the Anastrozole once daily for one week. If you do not have dizziness then start taking one tablet daily.  Start taking over the counter Iron 65 mg  on Monday, Wednesday and Friday.   Do not take over the counter NSAIDs and be sure that you are drinking lots of fluids to help with your creatinine- kidney functions.  Follow-up as scheduled in 3 months with labs.    Thank you for choosing [facility] to provide your oncology and hematology care.   To afford each patient quality time with our provider, please arrive at least 15 minutes before your scheduled appointment time. You may need to reschedule your appointment if you arrive late (10 or more minutes). Arriving late affects you and other patients whose appointments are after yours.  Also, if you miss three or more appointments without notifying the office, you may be dismissed from the clinic at the provider's discretion.    Again, thank you for choosing [facility].  Our hope is that these requests will decrease the amount of time that you wait before being seen by our physicians.   If you have a lab appointment with the [facility] please come in thru the Main Entrance and check in at the main information desk.           _____________________________________________________________  Should you have questions after your visit to [facility], please contact our office at [phone] and follow the prompts.  Our office hours are 8:00 a.m. to 4:30 p.m. Monday - Thursday and 8:00 a.m. to 2:30 p.m. Friday.  Please note that voicemails left after 4:00 p.m. may not be returned until the following  business day.  We are closed weekends and all major holidays.  You do have access to a nurse 24-7, just call the main number to the clinic [phone] and do not press any options, hold on the line and a nurse will answer the phone.    For prescription refill requests, have your pharmacy contact our office and allow 72 hours.    Masks are optional in the cancer centers. If you would like for your care team to wear a mask while they are taking care of you, please let them know. You may have one support person who is at least 76 years old accompany you for your appointments.

## 2022-05-21 NOTE — Progress Notes (Signed)
[facility] [address] [place] [address]    Clinic Day:  05/21/2022  Referring physician: Coral Spikes, DO  Patient Care Team: Coral Spikes, DO as PCP - General (Family Medicine) Derek Jack, MD as Medical Oncologist (Medical Oncology) Brien Mates, RN as Oncology Nurse Navigator (Medical Oncology)   ASSESSMENT & PLAN:   Assessment: 1.  Stage I (T1AN0G1) left breast IDC, ER/PR positive, HER2 negative: - Screening mammogram (10/17/2021): Abnormal. - Diagnostic mammogram/ultrasound (10/23/2021): Irregular hypoechoic mass in the left breast at 3:00 measuring 0.5 x 0.5 x 0.4 cm.  No lymphadenopathy in the left axilla. - Biopsy (11/06/2021): Invasive ductal carcinoma, grade 1, ER 90% strong staining, PR 40%, Ki-67 1%, HER2 1+ - Left lumpectomy on 12/03/2021 - Pathology: Invasive carcinoma, ductal type, grade 1, 4 mm in greatest dimension, margins negative.  ER 90%, PR 40%, HER2 1+, Ki-67 1%.  PT1AP NX. - Anastrozole was prescribed on 01/09/2022.  She was concerned about dizziness as a side effect and did not start it. - She met with radiation oncology who did not recommend radiation.  2.  Social/family history: - She lives by herself at home.  Daughter is temporarily living with her.  She is independent of ADLs and IADLs.  She worked in Government social research officer records for 36 years at [employer] .  Never smoker. - Father had lung cancer and was smoker.  Paternal aunt had cancer of the female organs.  Brother had throat cancer and was smoker.   Plan: 1.  Stage I (PT1ANx G1) left breast IDC, ER/PR positive, HER2 negative: - She was prescribed anastrozole at last visit on 01/09/2022.  She has not started it as she was very concerned about dizziness as a side effect listed on her pill bottle. - She met with radiation oncology and reportedly they did not recommend radiation therapy. - I have discussed common side effects with anastrozole including vasomotor symptoms  and musculoskeletal symptoms.  I have assured her that dizziness is very unlikely side effect. - She is willing to try.  But she wants to start at half tablet daily for 7 days and then increase to full tablet daily. - I will see her back in 3 months for follow-up to see how she is tolerating it.  She will call us should she develop any other side effects.   2.  Bone health: - DEXA (11/15/2021): T-score -0.3, normal. - Vitamin D is 48. - Continue calcium and vitamin D supplements.  Will repeat DEXA scan 3 years from last.  3.  Normocytic anemia: - Hemoglobin 9.6.  Creatinine is 1.73.  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 - Recommend starting iron tablet 3 times weekly as she gets constipated with higher dose. - Will plan to repeat ferritin and iron panel in 3 months.  If no improvement, consider parenteral iron therapy.  Orders Placed This Encounter  Procedures   CBC with Differential/Platelet    Standing Status:   Future    Standing Expiration Date:   05/22/2023    Order Specific Question:   Release to patient    Answer:   Immediate   Comprehensive metabolic panel    Standing Status:   Future    Standing Expiration Date:   05/22/2023    Order Specific Question:   Release to patient    Answer:   Immediate   Iron and TIBC    Standing Status:   Future    Standing Expiration Date:  05/22/2023    Order Specific Question:   Release to patient    Answer:   Immediate   Ferritin    Standing Status:   Future    Standing Expiration Date:   05/22/2023    Order Specific Question:   Release to patient    Answer:   Immediate      [name] as a scribe for Derek Jack, MD.,have documented all relevant documentation on the behalf of Derek Jack, MD,as directed by  Derek Jack, MD while in the presence of Derek Jack, MD.   I, Derek Jack MD, have reviewed the above documentation for accuracy and completeness, and I agree with the  above.   Derek Jack, MD   2/13/20246:37 PM  CHIEF COMPLAINT:   Diagnosis: left breast cancer    Cancer Staging  Breast cancer of upper-outer quadrant of left female breast [facility]) Staging form: Breast, AJCC 8th Edition - Clinical stage from 11/12/2021: Stage IA (cT1a, cN0, cM0, G1, ER+, PR+, HER2-) - Unsigned    Prior Therapy: Left lumpectomy on 12/03/2021   Current Therapy:  Anastrozole    HISTORY OF PRESENT ILLNESS:   Oncology History   No history exists.     INTERVAL HISTORY:   Stacy Webb is a 77 y.o. female presenting to clinic today for follow up of left breast cancer . She was last seen by me on 01/09/2022.  Today, she states that she is doing well overall. Her appetite level is at 100%. Her energy level is at 70%. Her cough is fairly new she had it for  the course of 4 weeks. She denies recent infections. She is taking Calcium and VitaminD. She was started on Anastrozole but did not take it due to concern of possible side effects. She also did have radiation done due to radiation oncologist not recommending radiation treatment. She has iron pills but stopped due to constipation.   PAST MEDICAL HISTORY:   Past Medical History: 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[place])    Diabetes mellitus without complication ([place])    Diabetic foot ulcer ([place]) 09/27/2016   Excessive hair growth 07/19/2015   Heart murmur    History of kidney stones    Hyperlipidemia    Hypertension    Neuromuscular disorder ([place])    diabetic neuropathy   Neuropathy    Postmenopausal 07/19/2015    Surgical History: Past Surgical History:  Procedure Laterality Date   ANTERIOR AND POSTERIOR REPAIR N/A 04/28/2018   Procedure: ANTERIOR (CYSTOCELE) AND POSTERIOR REPAIR (RECTOCELE);  Surgeon: Jonnie Kind, MD;  Location: [facility];  Service: Gynecology;  Laterality: N/A;   APPENDECTOMY     CARPAL TUNNEL  RELEASE Bilateral 2001   CESAREAN SECTION     COLONOSCOPY N/A 08/28/2012   Procedure: COLONOSCOPY;  Surgeon: Rogene Houston, MD;  Location: [facility];  Service: Endoscopy;  Laterality: N/A;  915   COLONOSCOPY N/A 12/25/2017   Procedure: COLONOSCOPY;  Surgeon: Rogene Houston, MD;  Location: [facility];  Service: Endoscopy;  Laterality: N/A;  12:45   REVERSE SHOULDER ARTHROPLASTY Left 07/10/2020   Procedure: REVERSE SHOULDER ARTHROPLASTY;  Surgeon: [name], MD;  Location: [facility];  Service: Orthopedics;  Laterality: Left;    Social History: Social History   Socioeconomic History   Marital status: Widowed    Spouse name: Not on file  Number of children: 3   Years of education: Not on file   Highest education level: Not on file  Occupational History   Occupation: [employer]    Comment: Medical Records  Tobacco Use   Smoking status: Never   Smokeless tobacco: Never  Vaping Use   Vaping Use: Never used  Substance and Sexual Activity   Alcohol use: No   Drug use: No   Sexual activity: Not Currently    Birth control/protection: Post-menopausal  Other Topics Concern   Not on file  Social History Narrative   Husband had ALS and passed away.Twin boys, but one died at 28 months and her other son died in 06/28/2014.      Living daughter- lives with pt.   Social Determinants of Health   Financial Resource Strain: Low Risk  (10/02/2021)   Overall Financial Resource Strain (CARDIA)    Difficulty of Paying Living Expenses: Not hard at all  Food Insecurity: No Food Insecurity (10/02/2021)   Hunger Vital Sign    Worried About Running Out of Food in the Last Year: Never true    Ran Out of Food in the Last Year: Never true  Transportation Needs: No Transportation Needs (10/02/2021)   PRAPARE - Hydrologist (Medical): No    Lack of Transportation (Non-Medical): No  Physical Activity: Insufficiently Active (10/02/2021)   Exercise Vital Sign    Days of Exercise  per Week: 2 days    Minutes of Exercise per Session: 60 min  Stress: No Stress Concern Present (10/02/2021)   [place]    Feeling of Stress : Not at all  Social Connections: [place] (10/02/2021)   Social Connection and Isolation Panel [NHANES]    Frequency of Communication with Friends and Family: More than three times a week    Frequency of Social Gatherings with Friends and Family: More than three times a week    Attends Religious Services: More than 4 times per year    Active Member of [employer] or Organizations: Yes    Attends Archivist Meetings: More than 4 times per year    Marital Status: Married  Human resources officer Violence: Not At Risk (10/02/2021)   Humiliation, Afraid, Rape, and Kick questionnaire    Fear of Current or Ex-Partner: No    Emotionally Abused: No    Physically Abused: No    Sexually Abused: No    Family History: Family History  Problem Relation Age of Onset   Pneumonia Son    Suicidality Son    Diabetes Daughter    Colon cancer Paternal Aunt     Current Medications:  Current Outpatient Medications:    atorvastatin (LIPITOR) 20 MG tablet, TAKE 1 TABLET BY MOUTH DAILY, Disp: 90 tablet, Rfl: 3   baclofen (LIORESAL) 10 MG tablet, Take 1 tablet (10 mg total) by mouth 3 (three) times daily as needed for muscle spasms., Disp: 30 each, Rfl: 0   blood glucose meter kit and supplies, Dispense based on patient and insurance preference. Use up to four times daily as directed. (FOR ICD-10 E10.9, E11.9)., Disp: 1 each, Rfl: 0   calcium-vitamin D (OSCAL WITH D) 500-5 MG-MCG tablet, Take 1 tablet by mouth., Disp: , Rfl:    diltiazem (CARDIZEM LA) 240 MG 24 hr tablet, TAKE 1 TABLET BY MOUTH DAILY, Disp: 100 tablet, Rfl: 0   docusate sodium (COLACE) 100 MG capsule, Take 100 mg by mouth at bedtime.,  Disp: , Rfl:    glimepiride (AMARYL) 1 MG tablet, Take 1 tablet (1 mg total) by mouth daily  with breakfast., Disp: 90 tablet, Rfl: 3   glimepiride (AMARYL) 1 MG tablet, Take 1 tablet (1 mg total) by mouth daily with breakfast., Disp: 15 tablet, Rfl: 0   glucose blood (ACCU-CHEK GUIDE) test strip, Tests once a day, Disp: 50 each, Rfl: 5   hydrochlorothiazide (HYDRODIURIL) 25 MG tablet, TAKE 1 TABLET BY MOUTH  DAILY (Patient taking differently: Take 12.5 mg by mouth daily.), Disp: 100 tablet, Rfl: 2   losartan (COZAAR) 50 MG tablet, Take 0.5 tablets (25 mg total) by mouth daily., Disp: 45 tablet, Rfl: 3   Allergies: Allergies  Allergen Reactions   Clindamycin/Lincomycin Other (See Comments)    Mouth taste like metal   Enalapril Cough    REVIEW OF SYSTEMS:   Review of Systems  Constitutional:  Negative for chills, fatigue and fever.  HENT:   Negative for lump/mass, mouth sores, nosebleeds, sore throat and trouble swallowing.   Eyes:  Negative for eye problems.  Respiratory:  Positive for cough. Negative for shortness of breath.   Cardiovascular:  Negative for chest pain, leg swelling and palpitations.  Gastrointestinal:  Negative for abdominal pain, constipation, diarrhea, nausea and vomiting.  Genitourinary:  Negative for bladder incontinence, difficulty urinating, dysuria, frequency, hematuria and nocturia.   Musculoskeletal:  Negative for arthralgias, back pain, flank pain, myalgias and neck pain.  Skin:  Negative for itching and rash.  Neurological:  Negative for dizziness, headaches and numbness.  Hematological:  Does not bruise/bleed easily.  Psychiatric/Behavioral:  Positive for sleep disturbance (Not sleeping at night). Negative for depression and suicidal ideas. The patient is not nervous/anxious.   All other systems reviewed and are negative.    VITALS:   Blood pressure 122/70, pulse 72, temperature 98.5 F (36.9 C), temperature source Tympanic, resp. rate 18, height 5' 6"$  (1.676 m), weight 150 lb (68 kg), SpO2 100 %.  Wt Readings from Last 3 Encounters:   05/21/22 150 lb (68 kg)  05/03/22 150 lb (68 kg)  04/12/22 147 lb (66.7 kg)    Body mass index is 24.21 kg/m.  Performance status (ECOG): 1 - Symptomatic but completely ambulatory  PHYSICAL EXAM:   Physical Exam Vitals and nursing note reviewed. Exam conducted with a chaperone present.  Constitutional:      Appearance: Normal appearance.  Cardiovascular:     Rate and Rhythm: Normal rate and regular rhythm.     Pulses: Normal pulses.     Heart sounds: Normal heart sounds.  Pulmonary:     Effort: Pulmonary effort is normal.     Breath sounds: Normal breath sounds.  Chest:  Breasts:    Right: No mass.     Left: No mass.     Comments:  Left breast lumpectomy scar UOQ Stable  No masses bilateral breasts  No palpable adenopathy   Abdominal:     Palpations: Abdomen is soft. There is no hepatomegaly, splenomegaly or mass.     Tenderness: There is no abdominal tenderness.  Musculoskeletal:     Right lower leg: No edema.     Left lower leg: No edema.  Lymphadenopathy:     Cervical: No cervical adenopathy.     Right cervical: No superficial, deep or posterior cervical adenopathy.    Left cervical: No superficial, deep or posterior cervical adenopathy.     Upper Body:     Right upper body: No supraclavicular or axillary adenopathy.  Left upper body: No supraclavicular or axillary adenopathy.  Neurological:     General: No focal deficit present.     Mental Status: She is alert and oriented to person, place, and time.  Psychiatric:        Mood and Affect: Mood normal.        Behavior: Behavior normal.    Breast Exam Chaperone: Satira Anis, LPN   LABS:      Latest Ref Rng & Units 05/14/2022    2:22 PM 11/15/2021    8:59 AM 03/02/2021    9:07 AM  CBC  WBC 4.0 - 10.5 K/uL 7.1  7.1  7.7   Hemoglobin 12.0 - 15.0 g/dL 9.6  10.3  10.7   Hematocrit 36.0 - 46.0 % 29.7  31.3  33.4   Platelets 150 - 400 K/uL 241  264  294       Latest Ref Rng & Units 05/14/2022    2:22  PM 11/15/2021    8:59 AM 03/02/2021    9:07 AM  CMP  Glucose 70 - 99 mg/dL 99  118  105   BUN 8 - 23 mg/dL 33  31  36   Creatinine 0.44 - 1.00 mg/dL 1.73  1.56  1.64   Sodium 135 - 145 mmol/L 137  139  140   Potassium 3.5 - 5.1 mmol/L 3.9  4.1  5.0   Chloride 98 - 111 mmol/L 104  108  105   CO2 22 - 32 mmol/L 24  24  22   $ Calcium 8.9 - 10.3 mg/dL 8.8  9.1  10.0   Total Protein 6.5 - 8.1 g/dL 6.8  7.5  6.8   Total Bilirubin 0.3 - 1.2 mg/dL 0.4  0.5  0.3   Alkaline Phos 38 - 126 U/L 61  70  83   AST 15 - 41 U/L 28  27  23   $ ALT 0 - 44 U/L 23  20  13      $ No results found for: "CEA1", "CEA" / No results found for: "CEA1", "CEA" No results found for: "PSA1" No results found for: "[id number]" No results found for: "CAN125"  No results found for: "TOTALPROTELP", "ALBUMINELP", "A1GS", "A2GS", "BETS", "BETA2SER", "GAMS", "MSPIKE", "SPEI" Lab Results  Component Value Date   TIBC 345 09/15/2017   FERRITIN 295 (H) 10/26/2020   FERRITIN 90 10/23/2017   FERRITIN 52 09/15/2017   IRONPCTSAT 10 (L) 09/15/2017   No results found for: "LDH"   STUDIES:   No results found.

## 2022-05-28 DIAGNOSIS — E1122 Type 2 diabetes mellitus with diabetic chronic kidney disease: Secondary | ICD-10-CM | POA: Diagnosis not present

## 2022-05-28 DIAGNOSIS — N170 Acute kidney failure with tubular necrosis: Secondary | ICD-10-CM | POA: Diagnosis not present

## 2022-05-28 DIAGNOSIS — D631 Anemia in chronic kidney disease: Secondary | ICD-10-CM | POA: Diagnosis not present

## 2022-05-28 DIAGNOSIS — N189 Chronic kidney disease, unspecified: Secondary | ICD-10-CM | POA: Diagnosis not present

## 2022-05-28 DIAGNOSIS — N270 Small kidney, unilateral: Secondary | ICD-10-CM | POA: Diagnosis not present

## 2022-05-28 DIAGNOSIS — I129 Hypertensive chronic kidney disease with stage 1 through stage 4 chronic kidney disease, or unspecified chronic kidney disease: Secondary | ICD-10-CM | POA: Diagnosis not present

## 2022-05-29 DIAGNOSIS — E1142 Type 2 diabetes mellitus with diabetic polyneuropathy: Secondary | ICD-10-CM | POA: Insufficient documentation

## 2022-05-29 DIAGNOSIS — E1140 Type 2 diabetes mellitus with diabetic neuropathy, unspecified: Secondary | ICD-10-CM | POA: Diagnosis not present

## 2022-05-29 DIAGNOSIS — M14672 Charcot's joint, left ankle and foot: Secondary | ICD-10-CM | POA: Diagnosis not present

## 2022-05-30 ENCOUNTER — Other Ambulatory Visit: Payer: Self-pay | Admitting: *Deleted

## 2022-05-30 MED ORDER — ACCU-CHEK GUIDE VI STRP: ORAL_STRIP | 5 refills | Status: DC

## 2022-05-31 DIAGNOSIS — E1122 Type 2 diabetes mellitus with diabetic chronic kidney disease: Secondary | ICD-10-CM | POA: Diagnosis not present

## 2022-05-31 DIAGNOSIS — D631 Anemia in chronic kidney disease: Secondary | ICD-10-CM | POA: Diagnosis not present

## 2022-05-31 DIAGNOSIS — I129 Hypertensive chronic kidney disease with stage 1 through stage 4 chronic kidney disease, or unspecified chronic kidney disease: Secondary | ICD-10-CM | POA: Diagnosis not present

## 2022-05-31 DIAGNOSIS — N270 Small kidney, unilateral: Secondary | ICD-10-CM | POA: Diagnosis not present

## 2022-05-31 DIAGNOSIS — N189 Chronic kidney disease, unspecified: Secondary | ICD-10-CM | POA: Diagnosis not present

## 2022-06-11 ENCOUNTER — Telehealth: Payer: Self-pay | Admitting: Family Medicine

## 2022-06-11 MED ORDER — ACCU-CHEK GUIDE VI STRP: ORAL_STRIP | 5 refills | Status: AC

## 2022-06-11 NOTE — Telephone Encounter (Signed)
Patient notified

## 2022-06-11 NOTE — Telephone Encounter (Signed)
Prescription sent electronically to the pharmacy.   Left message to return call

## 2022-06-11 NOTE — Telephone Encounter (Signed)
Refill on glucose blood (ACCU-CHEK GUIDE) test strip sent to [facility]

## 2022-06-25 ENCOUNTER — Ambulatory Visit: Payer: Medicare Other | Admitting: Orthopedic Surgery

## 2022-06-25 ENCOUNTER — Telehealth: Payer: Self-pay | Admitting: Orthopedic Surgery

## 2022-06-25 NOTE — Telephone Encounter (Signed)
Called and let pt know that provider likes to wait 3 mos. Before a repeat injection. Pt verbalized understanding and will callback to reschedule.

## 2022-06-25 NOTE — Telephone Encounter (Signed)
I called the patient back to advise her per the appt note it is too soon to get her injection.  She wants to know when was her last injection, because her knee is hurting SO bad.  I offered her this Friday and she can't do Friday, she has to take her daughter to the doctor.  She said she will call back later.   Abigail Butts asked me to do a EPIC note and to let [place] call her back at [phone].   Thanks

## 2022-06-26 ENCOUNTER — Other Ambulatory Visit: Payer: Self-pay | Admitting: Family Medicine

## 2022-06-26 DIAGNOSIS — I10 Essential (primary) hypertension: Secondary | ICD-10-CM

## 2022-07-11 ENCOUNTER — Other Ambulatory Visit: Payer: Self-pay | Admitting: Family Medicine

## 2022-07-11 DIAGNOSIS — I129 Hypertensive chronic kidney disease with stage 1 through stage 4 chronic kidney disease, or unspecified chronic kidney disease: Secondary | ICD-10-CM | POA: Diagnosis not present

## 2022-07-11 DIAGNOSIS — E1122 Type 2 diabetes mellitus with diabetic chronic kidney disease: Secondary | ICD-10-CM | POA: Diagnosis not present

## 2022-07-11 DIAGNOSIS — N170 Acute kidney failure with tubular necrosis: Secondary | ICD-10-CM | POA: Diagnosis not present

## 2022-07-11 DIAGNOSIS — N189 Chronic kidney disease, unspecified: Secondary | ICD-10-CM | POA: Diagnosis not present

## 2022-07-11 DIAGNOSIS — D638 Anemia in other chronic diseases classified elsewhere: Secondary | ICD-10-CM | POA: Diagnosis not present

## 2022-07-25 DIAGNOSIS — X32XXXD Exposure to sunlight, subsequent encounter: Secondary | ICD-10-CM | POA: Diagnosis not present

## 2022-07-25 DIAGNOSIS — L110 Acquired keratosis follicularis: Secondary | ICD-10-CM | POA: Diagnosis not present

## 2022-07-25 DIAGNOSIS — L308 Other specified dermatitis: Secondary | ICD-10-CM | POA: Diagnosis not present

## 2022-07-25 DIAGNOSIS — L0202 Furuncle of face: Secondary | ICD-10-CM | POA: Diagnosis not present

## 2022-07-25 DIAGNOSIS — B9689 Other specified bacterial agents as the cause of diseases classified elsewhere: Secondary | ICD-10-CM | POA: Diagnosis not present

## 2022-07-25 DIAGNOSIS — M79671 Pain in right foot: Secondary | ICD-10-CM | POA: Diagnosis not present

## 2022-07-25 DIAGNOSIS — B078 Other viral warts: Secondary | ICD-10-CM | POA: Diagnosis not present

## 2022-07-25 DIAGNOSIS — M79672 Pain in left foot: Secondary | ICD-10-CM | POA: Diagnosis not present

## 2022-07-25 DIAGNOSIS — I739 Peripheral vascular disease, unspecified: Secondary | ICD-10-CM | POA: Diagnosis not present

## 2022-07-25 DIAGNOSIS — E1140 Type 2 diabetes mellitus with diabetic neuropathy, unspecified: Secondary | ICD-10-CM | POA: Diagnosis not present

## 2022-07-25 DIAGNOSIS — L570 Actinic keratosis: Secondary | ICD-10-CM | POA: Diagnosis not present

## 2022-08-05 ENCOUNTER — Other Ambulatory Visit: Payer: Self-pay

## 2022-08-13 ENCOUNTER — Inpatient Hospital Stay: Payer: Medicare Other | Attending: Hematology

## 2022-08-13 DIAGNOSIS — Z808 Family history of malignant neoplasm of other organs or systems: Secondary | ICD-10-CM | POA: Diagnosis not present

## 2022-08-13 DIAGNOSIS — E1122 Type 2 diabetes mellitus with diabetic chronic kidney disease: Secondary | ICD-10-CM | POA: Diagnosis not present

## 2022-08-13 DIAGNOSIS — D631 Anemia in chronic kidney disease: Secondary | ICD-10-CM | POA: Insufficient documentation

## 2022-08-13 DIAGNOSIS — E785 Hyperlipidemia, unspecified: Secondary | ICD-10-CM | POA: Diagnosis not present

## 2022-08-13 DIAGNOSIS — Z79899 Other long term (current) drug therapy: Secondary | ICD-10-CM | POA: Insufficient documentation

## 2022-08-13 DIAGNOSIS — D509 Iron deficiency anemia, unspecified: Secondary | ICD-10-CM | POA: Diagnosis not present

## 2022-08-13 DIAGNOSIS — Z9049 Acquired absence of other specified parts of digestive tract: Secondary | ICD-10-CM | POA: Insufficient documentation

## 2022-08-13 DIAGNOSIS — Z8049 Family history of malignant neoplasm of other genital organs: Secondary | ICD-10-CM | POA: Insufficient documentation

## 2022-08-13 DIAGNOSIS — C50412 Malignant neoplasm of upper-outer quadrant of left female breast: Secondary | ICD-10-CM | POA: Insufficient documentation

## 2022-08-13 DIAGNOSIS — I129 Hypertensive chronic kidney disease with stage 1 through stage 4 chronic kidney disease, or unspecified chronic kidney disease: Secondary | ICD-10-CM | POA: Diagnosis not present

## 2022-08-13 DIAGNOSIS — Z170 Estrogen receptor positive status [ER+]: Secondary | ICD-10-CM | POA: Insufficient documentation

## 2022-08-13 DIAGNOSIS — K5900 Constipation, unspecified: Secondary | ICD-10-CM | POA: Diagnosis not present

## 2022-08-13 DIAGNOSIS — N1832 Chronic kidney disease, stage 3b: Secondary | ICD-10-CM | POA: Insufficient documentation

## 2022-08-13 DIAGNOSIS — Z881 Allergy status to other antibiotic agents status: Secondary | ICD-10-CM | POA: Insufficient documentation

## 2022-08-13 DIAGNOSIS — Z87442 Personal history of urinary calculi: Secondary | ICD-10-CM | POA: Diagnosis not present

## 2022-08-13 DIAGNOSIS — Z801 Family history of malignant neoplasm of trachea, bronchus and lung: Secondary | ICD-10-CM | POA: Insufficient documentation

## 2022-08-13 LAB — CBC WITH DIFFERENTIAL/PLATELET
Abs Immature Granulocytes: 0.04 10*3/uL (ref 0.00–0.07)
Basophils Absolute: 0.1 10*3/uL (ref 0.0–0.1)
Basophils Relative: 1 %
Eosinophils Absolute: 0.2 10*3/uL (ref 0.0–0.5)
Eosinophils Relative: 3 %
HCT: 29.9 % — ABNORMAL LOW (ref 36.0–46.0)
Hemoglobin: 9.8 g/dL — ABNORMAL LOW (ref 12.0–15.0)
Immature Granulocytes: 0 %
Lymphocytes Relative: 26 %
Lymphs Abs: 2.4 10*3/uL (ref 0.7–4.0)
MCH: 27.1 pg (ref 26.0–34.0)
MCHC: 32.8 g/dL (ref 30.0–36.0)
MCV: 82.6 fL (ref 80.0–100.0)
Monocytes Absolute: 1.3 10*3/uL — ABNORMAL HIGH (ref 0.1–1.0)
Monocytes Relative: 14 %
Neutro Abs: 5 10*3/uL (ref 1.7–7.7)
Neutrophils Relative %: 56 %
Platelets: 257 10*3/uL (ref 150–400)
RBC: 3.62 MIL/uL — ABNORMAL LOW (ref 3.87–5.11)
RDW: 15.3 % (ref 11.5–15.5)
WBC: 9 10*3/uL (ref 4.0–10.5)
nRBC: 0 % (ref 0.0–0.2)

## 2022-08-13 LAB — COMPREHENSIVE METABOLIC PANEL
ALT: 24 U/L (ref 0–44)
AST: 30 U/L (ref 15–41)
Albumin: 4 g/dL (ref 3.5–5.0)
Alkaline Phosphatase: 69 U/L (ref 38–126)
Anion gap: 11 (ref 5–15)
BUN: 45 mg/dL — ABNORMAL HIGH (ref 8–23)
CO2: 24 mmol/L (ref 22–32)
Calcium: 9 mg/dL (ref 8.9–10.3)
Chloride: 102 mmol/L (ref 98–111)
Creatinine, Ser: 1.74 mg/dL — ABNORMAL HIGH (ref 0.44–1.00)
GFR, Estimated: 30 mL/min — ABNORMAL LOW (ref >60)
Glucose, Bld: 110 mg/dL — ABNORMAL HIGH (ref 70–99)
Potassium: 3.2 mmol/L — ABNORMAL LOW (ref 3.5–5.1)
Sodium: 137 mmol/L (ref 135–145)
Total Bilirubin: 0.7 mg/dL (ref 0.3–1.2)
Total Protein: 7.1 g/dL (ref 6.5–8.1)

## 2022-08-13 LAB — IRON AND TIBC
Iron: 33 ug/dL (ref 28–170)
Saturation Ratios: 8 % — ABNORMAL LOW (ref 10.4–31.8)
TIBC: 391 ug/dL (ref 250–450)
UIBC: 358 ug/dL

## 2022-08-13 LAB — FERRITIN: Ferritin: 71 ng/mL (ref 11–307)

## 2022-08-19 NOTE — Progress Notes (Signed)
[facility] [address] [place] [address]    Clinic Day:  08/20/2022  Referring physician: [name], DO  Patient Care Team: [name], DO as PCP - General (Family Medicine) [name], MD as Medical Oncologist (Medical Oncology) [name], RN as Oncology Nurse Navigator (Medical Oncology)   ASSESSMENT & PLAN:   Assessment: 1.  Stage I (T1AN0G1) left breast IDC, ER/PR positive, HER2 negative: - Screening mammogram (10/17/2021): Abnormal. - Diagnostic mammogram/ultrasound (10/23/2021): Irregular hypoechoic mass in the left breast at 3:00 measuring 0.5 x 0.5 x 0.4 cm.  No lymphadenopathy in the left axilla. - Biopsy (11/06/2021): Invasive ductal carcinoma, grade 1, ER 90% strong staining, PR 40%, Ki-67 1%, HER2 1+ - Left lumpectomy on 12/03/2021 - Pathology: Invasive carcinoma, ductal type, grade 1, 4 mm in greatest dimension, margins negative.  ER 90%, PR 40%, HER2 1+, Ki-67 1%.  PT1AP NX. - Anastrozole was prescribed on 01/09/2022.  She was concerned about dizziness as a side effect and did not start it. - She met with radiation oncology who did not recommend radiation.  2.  Social/family history: - She lives by herself at home.  Daughter is temporarily living with her.  She is independent of ADLs and IADLs.  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  Never smoker. - Father had lung cancer and was smoker.  Paternal aunt had cancer of the female organs.  Brother had throat cancer and was smoker.     Plan: 1.  Stage I (PT1ANx G1) left breast IDC, ER/PR positive, HER2 negative: - At last visit, we discussed starting on anastrozole.  She did not start it as she was concerned about side effects particularly arthralgias. - Physical exam today: Left breast upper outer quadrant lumpectomy scar within normal limits with no palpable masses.  No palpable adenopathy. - Labs: Normal LFTs.  Creatinine 1.74 and stable.  - We  discussed the importance of adjuvant endocrine therapy to prevent breast cancer recurrence.  We discussed alternative treatment with tamoxifen to minimize arthralgias.  Will start at low-dose of 10 mg daily. - Will schedule for bilateral diagnostic mammogram after 10/18/2022. - RTC 3 months to see how she is tolerating tamoxifen.   2.  Bone health (DEXA 11/15/2021 T-score -0.3): - Last vitamin D is 48. - Continue calcium and vitamin D supplements.   3.  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  Hemoglobin was 9.8, ferritin 71 and percent saturation 8. - Cannot take iron pills due to severe constipation. - We discussed Feraheme weekly x 2.  We discussed side effects including rare chance of anaphylactic reaction.  She has tolerated it well in 2021 without any reactions.  Will plan to repeat ferritin and iron panel in 3 months.    Orders Placed This Encounter  Procedures   MM 3D DIAGNOSTIC MAMMOGRAM BILATERAL BREAST    NAS No implants No medical devices  Sch'd appt with office on 08/20/22-kjones    Standing Status:   Future    Standing Expiration Date:   08/20/2023    Order Specific Question:   Reason for Exam (SYMPTOM  OR DIAGNOSIS REQUIRED)    Answer:   history of breast cancer    Order Specific Question:   Preferred imaging location?    Answer:   [facility]    Order Specific Question:   Release to patient    Answer:   Immediate  CBC with Differential    Standing Status:   Future    Standing Expiration Date:   08/20/2023   Comprehensive metabolic panel    Standing Status:   Future    Standing Expiration Date:   08/20/2023   Ferritin    Standing Status:   Future    Standing Expiration Date:   08/20/2023   Iron and TIBC ([facility] ONLY)    Standing Status:   Future    Standing Expiration Date:   08/20/2023      I,Katie Daubenspeck,acting as a scribe for [name], MD.,have documented all relevant documentation on the behalf of  [name], MD,as directed by  [name], MD while in the presence of [name], MD.   I, [name] MD, have reviewed the above documentation for accuracy and completeness, and I agree with the above.   [name], MD   5/14/20244:41 PM  CHIEF COMPLAINT:   Diagnosis: left breast cancer    Cancer Staging  Breast cancer of upper-outer quadrant of left female breast [facility]) Staging form: Breast, AJCC 8th Edition - Clinical stage from 11/12/2021: Stage IA (cT1a, cN0, cM0, G1, ER+, PR+, HER2-) - Unsigned    Prior Therapy: Left lumpectomy on 12/03/2021   Current Therapy: Tamoxifen 10 mg daily   HISTORY OF PRESENT ILLNESS:   Oncology History   No history exists.     INTERVAL HISTORY:   Stacy Webb is a 77 y.o. female presenting to clinic today for follow up of left breast cancer. She was last seen by me on 05/21/22.  Today, she states that she is doing well overall. Her appetite level is at 100%. Her energy level is at 80%.  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)    Diabetes mellitus without complication (HCC)    Diabetic foot ulcer (HCC) 09/27/2016   Excessive hair growth 07/19/2015   Heart murmur    History of kidney stones    Hyperlipidemia    Hypertension    Neuromuscular disorder (HCC)    diabetic neuropathy   Neuropathy    Postmenopausal 07/19/2015    Surgical History: Past Surgical History:  Procedure Laterality Date   ANTERIOR AND POSTERIOR REPAIR N/A 04/28/2018   Procedure: ANTERIOR (CYSTOCELE) AND POSTERIOR REPAIR (RECTOCELE);  Surgeon: Tilda Burrow, MD;  Location: [facility];  Service: Gynecology;  Laterality: N/A;   APPENDECTOMY     CARPAL TUNNEL RELEASE Bilateral 2001   CESAREAN SECTION     COLONOSCOPY N/A 08/28/2012   Procedure: COLONOSCOPY;  Surgeon: Malissa Hippo, MD;   Location: [facility];  Service: Endoscopy;  Laterality: N/A;  915   COLONOSCOPY N/A 12/25/2017   Procedure: COLONOSCOPY;  Surgeon: Malissa Hippo, MD;  Location: [facility];  Service: Endoscopy;  Laterality: N/A;  12:45   REVERSE SHOULDER ARTHROPLASTY Left 07/10/2020   Procedure: REVERSE SHOULDER ARTHROPLASTY;  Surgeon: Oliver Barre, MD;  Location: [facility];  Service: Orthopedics;  Laterality: Left;    Social History: Social History   Socioeconomic History   Marital status: Widowed    Spouse name: Not on file   Number of children: 3   Years of education: Not on file   Highest education level: Not on file  Occupational History   Occupation: [employer]    Comment: Medical Records  Tobacco  Use   Smoking status: Never   Smokeless tobacco: Never  Vaping Use   Vaping Use: Never used  Substance and Sexual Activity   Alcohol use: No   Drug use: No   Sexual activity: Not Currently    Birth control/protection: Post-menopausal  Other Topics Concern   Not on file  Social History Narrative   Husband had ALS and passed away.Twin boys, but one died at 68 months and her other son died in Sep 23, 2014.      Living daughter- lives with pt.   Social Determinants of Health   Financial Resource Strain: Low Risk  (10/02/2021)   Overall Financial Resource Strain (CARDIA)    Difficulty of Paying Living Expenses: Not hard at all  Food Insecurity: No Food Insecurity (10/02/2021)   Hunger Vital Sign    Worried About Running Out of Food in the Last Year: Never true    Ran Out of Food in the Last Year: Never true  Transportation Needs: No Transportation Needs (10/02/2021)   PRAPARE - Administrator, Civil Service (Medical): No    Lack of Transportation (Non-Medical): No  Physical Activity: Insufficiently Active (10/02/2021)   Exercise Vital Sign    Days of Exercise per Week: 2 days    Minutes of Exercise per Session: 60 min  Stress: No Stress Concern Present (10/02/2021)   [employer] of  Occupational Health - Occupational Stress Questionnaire    Feeling of Stress : Not at all  Social Connections: Socially Integrated (10/02/2021)   Social Connection and Isolation Panel [NHANES]    Frequency of Communication with Friends and Family: More than three times a week    Frequency of Social Gatherings with Friends and Family: More than three times a week    Attends Religious Services: More than 4 times per year    Active Member of [employer] or Organizations: Yes    Attends Banker Meetings: More than 4 times per year    Marital Status: Married  Catering manager Violence: Not At Risk (10/02/2021)   Humiliation, Afraid, Rape, and Kick questionnaire    Fear of Current or Ex-Partner: No    Emotionally Abused: No    Physically Abused: No    Sexually Abused: No    Family History: Family History  Problem Relation Age of Onset   Pneumonia Son    Suicidality Son    Diabetes Daughter    Colon cancer Paternal Aunt     Current Medications:  Current Outpatient Medications:    atorvastatin (LIPITOR) 20 MG tablet, TAKE 1 TABLET BY MOUTH DAILY, Disp: 90 tablet, Rfl: 3   baclofen (LIORESAL) 10 MG tablet, Take 1 tablet (10 mg total) by mouth 3 (three) times daily as needed for muscle spasms., Disp: 30 each, Rfl: 0   blood glucose meter kit and supplies, Dispense based on patient and insurance preference. Use up to four times daily as directed. (FOR ICD-10 E10.9, E11.9)., Disp: 1 each, Rfl: 0   calcium-vitamin D (OSCAL WITH D) 500-5 MG-MCG tablet, Take 1 tablet by mouth., Disp: , Rfl:    diltiazem (CARDIZEM LA) 240 MG 24 hr tablet, TAKE 1 TABLET BY MOUTH DAILY, Disp: 100 tablet, Rfl: 0   docusate sodium (COLACE) 100 MG capsule, Take 100 mg by mouth at bedtime., Disp: , Rfl:    glimepiride (AMARYL) 1 MG tablet, Take 1 tablet (1 mg total) by mouth daily with breakfast., Disp: 15 tablet, Rfl: 0   glucose blood (ACCU-CHEK GUIDE) test strip,  Tests once a day, Disp: 50 each, Rfl: 5    hydrochlorothiazide (HYDRODIURIL) 25 MG tablet, TAKE 1 TABLET BY MOUTH DAILY, Disp: 100 tablet, Rfl: 2   tamoxifen (NOLVADEX) 10 MG tablet, Take 1 tablet (10 mg total) by mouth daily., Disp: 30 tablet, Rfl: 3   Allergies: Allergies  Allergen Reactions   Clindamycin/Lincomycin Other (See Comments)    Mouth taste like metal   Enalapril Cough    REVIEW OF SYSTEMS:   Review of Systems  Constitutional:  Negative for chills, fatigue and fever.  HENT:   Negative for lump/mass, mouth sores, nosebleeds, sore throat and trouble swallowing.   Eyes:  Negative for eye problems.  Respiratory:  Negative for cough and shortness of breath.   Cardiovascular:  Negative for chest pain, leg swelling and palpitations.  Gastrointestinal:  Positive for constipation. Negative for abdominal pain, diarrhea, nausea and vomiting.  Genitourinary:  Negative for bladder incontinence, difficulty urinating, dysuria, frequency, hematuria and nocturia.   Musculoskeletal:  Positive for arthralgias. Negative for back pain, flank pain, myalgias and neck pain.  Skin:  Negative for itching and rash.  Neurological:  Negative for dizziness, headaches and numbness.  Hematological:  Does not bruise/bleed easily.  Psychiatric/Behavioral:  Positive for sleep disturbance. Negative for depression and suicidal ideas. The patient is not nervous/anxious.   All other systems reviewed and are negative.    VITALS:   Blood pressure (!) 180/72, pulse 70, temperature (!) 97.2 F (36.2 C), temperature source Oral, resp. rate 18, weight 150 lb 12.8 oz (68.4 kg), SpO2 99 %.  Wt Readings from Last 3 Encounters:  08/20/22 150 lb 12.8 oz (68.4 kg)  05/21/22 150 lb (68 kg)  05/03/22 150 lb (68 kg)    Body mass index is 24.34 kg/m.  Performance status (ECOG): 1 - Symptomatic but completely ambulatory  PHYSICAL EXAM:   Physical Exam Vitals and nursing note reviewed. Exam conducted with a chaperone present.  Constitutional:       Appearance: Normal appearance.  Cardiovascular:     Rate and Rhythm: Normal rate and regular rhythm.     Pulses: Normal pulses.     Heart sounds: Normal heart sounds.  Pulmonary:     Effort: Pulmonary effort is normal.     Breath sounds: Normal breath sounds.  Abdominal:     Palpations: Abdomen is soft. There is no hepatomegaly, splenomegaly or mass.     Tenderness: There is no abdominal tenderness.  Musculoskeletal:     Right lower leg: No edema.     Left lower leg: No edema.  Lymphadenopathy:     Cervical: No cervical adenopathy.     Right cervical: No superficial, deep or posterior cervical adenopathy.    Left cervical: No superficial, deep or posterior cervical adenopathy.     Upper Body:     Right upper body: No supraclavicular or axillary adenopathy.     Left upper body: No supraclavicular or axillary adenopathy.  Neurological:     General: No focal deficit present.     Mental Status: She is alert and oriented to person, place, and time.  Psychiatric:        Mood and Affect: Mood normal.        Behavior: Behavior normal.     LABS:      Latest Ref Rng & Units 08/13/2022    2:50 PM 05/14/2022    2:22 PM 11/15/2021    8:59 AM  CBC  WBC 4.0 - 10.5 K/uL 9.0  7.1  7.1   Hemoglobin 12.0 - 15.0 g/dL 9.8  9.6  82.9   Hematocrit 36.0 - 46.0 % 29.9  29.7  31.3   Platelets 150 - 400 K/uL 257  241  264       Latest Ref Rng & Units 08/13/2022    2:50 PM 05/14/2022    2:22 PM 11/15/2021    8:59 AM  CMP  Glucose 70 - 99 mg/dL 562  99  130   BUN 8 - 23 mg/dL 45  33  31   Creatinine 0.44 - 1.00 mg/dL 8.65  7.84  6.96   Sodium 135 - 145 mmol/L 137  137  139   Potassium 3.5 - 5.1 mmol/L 3.2  3.9  4.1   Chloride 98 - 111 mmol/L 102  104  108   CO2 22 - 32 mmol/L 24  24  24    Calcium 8.9 - 10.3 mg/dL 9.0  8.8  9.1   Total Protein 6.5 - 8.1 g/dL 7.1  6.8  7.5   Total Bilirubin 0.3 - 1.2 mg/dL 0.7  0.4  0.5   Alkaline Phos 38 - 126 U/L 69  61  70   AST 15 - 41 U/L 30  28  27    ALT 0  - 44 U/L 24  23  20       No results found for: "CEA1", "CEA" / No results found for: "CEA1", "CEA" No results found for: "PSA1" No results found for: "EXB284" No results found for: "CAN125"  No results found for: "TOTALPROTELP", "ALBUMINELP", "A1GS", "A2GS", "BETS", "BETA2SER", "GAMS", "MSPIKE", "SPEI" Lab Results  Component Value Date   TIBC 391 08/13/2022   TIBC 345 09/15/2017   FERRITIN 71 08/13/2022   FERRITIN 295 (H) 10/26/2020   FERRITIN 90 10/23/2017   IRONPCTSAT 8 (L) 08/13/2022   IRONPCTSAT 10 (L) 09/15/2017   No results found for: "LDH"   STUDIES:   No results found.

## 2022-08-20 ENCOUNTER — Inpatient Hospital Stay (HOSPITAL_BASED_OUTPATIENT_CLINIC_OR_DEPARTMENT_OTHER): Payer: Medicare Other | Admitting: Hematology

## 2022-08-20 ENCOUNTER — Other Ambulatory Visit (HOSPITAL_COMMUNITY): Payer: Self-pay | Admitting: Hematology

## 2022-08-20 ENCOUNTER — Other Ambulatory Visit: Payer: Self-pay | Admitting: Hematology

## 2022-08-20 VITALS — BP 180/72 | HR 70 | Temp 97.2°F | Resp 18 | Wt 150.8 lb

## 2022-08-20 DIAGNOSIS — D509 Iron deficiency anemia, unspecified: Secondary | ICD-10-CM

## 2022-08-20 DIAGNOSIS — E1122 Type 2 diabetes mellitus with diabetic chronic kidney disease: Secondary | ICD-10-CM | POA: Diagnosis not present

## 2022-08-20 DIAGNOSIS — Z853 Personal history of malignant neoplasm of breast: Secondary | ICD-10-CM

## 2022-08-20 DIAGNOSIS — C50412 Malignant neoplasm of upper-outer quadrant of left female breast: Secondary | ICD-10-CM

## 2022-08-20 DIAGNOSIS — Z87442 Personal history of urinary calculi: Secondary | ICD-10-CM | POA: Diagnosis not present

## 2022-08-20 DIAGNOSIS — Z9049 Acquired absence of other specified parts of digestive tract: Secondary | ICD-10-CM | POA: Diagnosis not present

## 2022-08-20 DIAGNOSIS — Z801 Family history of malignant neoplasm of trachea, bronchus and lung: Secondary | ICD-10-CM | POA: Diagnosis not present

## 2022-08-20 DIAGNOSIS — I129 Hypertensive chronic kidney disease with stage 1 through stage 4 chronic kidney disease, or unspecified chronic kidney disease: Secondary | ICD-10-CM | POA: Diagnosis not present

## 2022-08-20 DIAGNOSIS — Z170 Estrogen receptor positive status [ER+]: Secondary | ICD-10-CM

## 2022-08-20 DIAGNOSIS — E785 Hyperlipidemia, unspecified: Secondary | ICD-10-CM | POA: Diagnosis not present

## 2022-08-20 DIAGNOSIS — K5900 Constipation, unspecified: Secondary | ICD-10-CM | POA: Diagnosis not present

## 2022-08-20 DIAGNOSIS — Z808 Family history of malignant neoplasm of other organs or systems: Secondary | ICD-10-CM | POA: Diagnosis not present

## 2022-08-20 DIAGNOSIS — D631 Anemia in chronic kidney disease: Secondary | ICD-10-CM | POA: Diagnosis not present

## 2022-08-20 DIAGNOSIS — Z79899 Other long term (current) drug therapy: Secondary | ICD-10-CM | POA: Diagnosis not present

## 2022-08-20 DIAGNOSIS — Z881 Allergy status to other antibiotic agents status: Secondary | ICD-10-CM | POA: Diagnosis not present

## 2022-08-20 DIAGNOSIS — N1832 Chronic kidney disease, stage 3b: Secondary | ICD-10-CM | POA: Diagnosis not present

## 2022-08-20 MED ORDER — TAMOXIFEN CITRATE 10 MG PO TABS: 10.0000 mg | ORAL_TABLET | Freq: Every day | ORAL | 1 refills | Status: DC

## 2022-08-20 MED ORDER — TAMOXIFEN CITRATE 10 MG PO TABS: 10.0000 mg | ORAL_TABLET | Freq: Every day | ORAL | 3 refills | Status: DC

## 2022-08-20 NOTE — Patient Instructions (Addendum)
[facility]  Discharge Instructions  You were seen and examined today by Dr. Ellin Saba.  Dr. Ellin Saba discussed your most recent lab work. You are in need of iron. Dr. Ellin Saba will arrange iron infusions. You last had iron infusions in 2021.  Consider taking something to prevent breast cancer recurrence. Dr. Ellin Saba has prescribed Tamoxifen in a low dose. The purpose of the pills are to decrease the chance of cancer returning in your body. Cancer can recur outside of the site that it originates.  The most common side effects are hot flashes. These typically subside after a couple months.  Follow-up as scheduled.  Thank you for choosing [facility] to provide your oncology and hematology care.   To afford each patient quality time with our provider, please arrive at least 15 minutes before your scheduled appointment time. You may need to reschedule your appointment if you arrive late (10 or more minutes). Arriving late affects you and other patients whose appointments are after yours.  Also, if you miss three or more appointments without notifying the office, you may be dismissed from the clinic at the provider's discretion.    Again, thank you for choosing [facility].  Our hope is that these requests will decrease the amount of time that you wait before being seen by our physicians.   If you have a lab appointment with the Cancer Center - please note that after April 8th, all labs will be drawn in the cancer center.  You do not have to check in or register with the main entrance as you have in the past but will complete your check-in at the cancer center.            _____________________________________________________________  Should you have questions after your visit to [facility], please contact our office at [phone] and follow the prompts.  Our office hours are 8:00 a.m. to 4:30 p.m. Monday  - Thursday and 8:00 a.m. to 2:30 p.m. Friday.  Please note that voicemails left after 4:00 p.m. may not be returned until the following business day.  We are closed weekends and all major holidays.  You do have access to a nurse 24-7, just call the main number to the clinic [phone] and do not press any options, hold on the line and a nurse will answer the phone.    For prescription refill requests, have your pharmacy contact our office and allow 72 hours.    Masks are no longer required in the cancer centers. If you would like for your care team to wear a mask while they are taking care of you, please let them know. You may have one support person who is at least 76 years old accompany you for your appointments.

## 2022-08-23 ENCOUNTER — Ambulatory Visit (INDEPENDENT_AMBULATORY_CARE_PROVIDER_SITE_OTHER): Payer: Medicare Other | Admitting: Orthopedic Surgery

## 2022-08-23 DIAGNOSIS — M1712 Unilateral primary osteoarthritis, left knee: Secondary | ICD-10-CM | POA: Diagnosis not present

## 2022-08-23 NOTE — Patient Instructions (Signed)

## 2022-08-23 NOTE — Progress Notes (Signed)
Orthopaedic Clinic Return  Assessment: Stacy Webb is a 77 y.o. 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  She remains active.  She had excellent relief from a prior knee injection, and is interested in another injection.  This was completed in clinic today without issues.  We also briefly discussed her right shoulder, where she has known rotator cuff arthropathy.  If she continues to have issues, we can discuss further options.  She will follow-up as needed.  Procedure note injection Left knee joint   Verbal consent was obtained to inject the left knee joint  Timeout was completed to confirm the site of injection.  The skin was prepped with alcohol and ethyl chloride was sprayed at the injection site.  A 21-gauge needle was used to inject 40 mg of Depo-Medrol and 1% lidocaine (3 cc) into the left knee using an anterolateral approach.  There were no complications. A sterile bandage was applied.     Follow-up: Return if symptoms worsen or fail to improve.   Subjective:  Chief Complaint  Patient presents with   Injections    L knee pain     History of Present Illness: Stacy Webb is a 77 y.o. female who returns to clinic for evaluation of left knee pain.  I saw her few months ago, at which time I injected her left knee.  She has known left knee arthritis.  She states that she had excellent relief of her symptoms for several months.  Over the past week or so, she started to have a lot of pain in the knee.  She states it is not that bad this morning, but notes it will get worse throughout the day.  No specific injury recently.  She is working out, participating in classes to improve her balance.  This may have caused some aggravation.  She wants another injection today.   Review of Systems: No fevers or chills No numbness or tingling No chest pain No shortness of breath No bowel or bladder dysfunction No GI distress No  headaches   Objective: There were no vitals taken for this visit.  Physical Exam:  Alert and oriented.  No acute distress.   Evaluation of left knee demonstrates mild varus alignment. Prominent medial femoral condyle.  Tenderness palpation medially.  No increased laxity varus or valgus stress.  Slightly restricted extension, unable to achieve full extension.    IMAGING: I personally ordered and reviewed the following images:  No new imaging obtained today.  Oliver Barre, MD 08/23/2022 9:01 AM

## 2022-09-06 ENCOUNTER — Inpatient Hospital Stay: Payer: Medicare Other

## 2022-09-06 VITALS — BP 152/73 | HR 60 | Temp 97.6°F | Resp 18

## 2022-09-06 DIAGNOSIS — Z881 Allergy status to other antibiotic agents status: Secondary | ICD-10-CM | POA: Diagnosis not present

## 2022-09-06 DIAGNOSIS — Z808 Family history of malignant neoplasm of other organs or systems: Secondary | ICD-10-CM | POA: Diagnosis not present

## 2022-09-06 DIAGNOSIS — D509 Iron deficiency anemia, unspecified: Secondary | ICD-10-CM | POA: Diagnosis not present

## 2022-09-06 DIAGNOSIS — C50412 Malignant neoplasm of upper-outer quadrant of left female breast: Secondary | ICD-10-CM | POA: Diagnosis not present

## 2022-09-06 DIAGNOSIS — Z801 Family history of malignant neoplasm of trachea, bronchus and lung: Secondary | ICD-10-CM | POA: Diagnosis not present

## 2022-09-06 DIAGNOSIS — D631 Anemia in chronic kidney disease: Secondary | ICD-10-CM | POA: Diagnosis not present

## 2022-09-06 DIAGNOSIS — Z170 Estrogen receptor positive status [ER+]: Secondary | ICD-10-CM | POA: Diagnosis not present

## 2022-09-06 DIAGNOSIS — E785 Hyperlipidemia, unspecified: Secondary | ICD-10-CM | POA: Diagnosis not present

## 2022-09-06 DIAGNOSIS — K5900 Constipation, unspecified: Secondary | ICD-10-CM | POA: Diagnosis not present

## 2022-09-06 DIAGNOSIS — Z9049 Acquired absence of other specified parts of digestive tract: Secondary | ICD-10-CM | POA: Diagnosis not present

## 2022-09-06 DIAGNOSIS — N1832 Chronic kidney disease, stage 3b: Secondary | ICD-10-CM | POA: Diagnosis not present

## 2022-09-06 DIAGNOSIS — Z79899 Other long term (current) drug therapy: Secondary | ICD-10-CM | POA: Diagnosis not present

## 2022-09-06 DIAGNOSIS — I129 Hypertensive chronic kidney disease with stage 1 through stage 4 chronic kidney disease, or unspecified chronic kidney disease: Secondary | ICD-10-CM | POA: Diagnosis not present

## 2022-09-06 DIAGNOSIS — Z87442 Personal history of urinary calculi: Secondary | ICD-10-CM | POA: Diagnosis not present

## 2022-09-06 DIAGNOSIS — E1122 Type 2 diabetes mellitus with diabetic chronic kidney disease: Secondary | ICD-10-CM | POA: Diagnosis not present

## 2022-09-06 MED ORDER — SODIUM CHLORIDE 0.9 % IV SOLN: Freq: Once | INTRAVENOUS | Status: AC

## 2022-09-06 MED ORDER — SODIUM CHLORIDE 0.9 % IV SOLN
510.0000 mg | Freq: Once | INTRAVENOUS | Status: AC
  Administered 2022-09-06: 510 mg via INTRAVENOUS
  Filled 2022-09-06: qty 510

## 2022-09-06 NOTE — Patient Instructions (Signed)
[facility]  Discharge Instructions: Thank you for choosing [facility] to provide your oncology and hematology care.  If you have a lab appointment with the Cancer Center - please note that after April 8th, 2024, all labs will be drawn in the cancer center.  You do not have to check in or register with the main entrance as you have in the past but will complete your check-in in the cancer center.  Wear comfortable clothing and clothing appropriate for easy access to any Portacath or PICC line.   We strive to give you quality time with your provider. You may need to reschedule your appointment if you arrive late (15 or more minutes).  Arriving late affects you and other patients whose appointments are after yours.  Also, if you miss three or more appointments without notifying the office, you may be dismissed from the clinic at the provider's discretion.      For prescription refill requests, have your pharmacy contact our office and allow 72 hours for refills to be completed.    Today you received the following chemotherapy and/or immunotherapy agents Feraheme. Ferumoxytol Injection What is this medication? FERUMOXYTOL (FER ue MOX i tol) treats low levels of iron in your body (iron deficiency anemia). Iron is a mineral that plays an important role in making red blood cells, which carry oxygen from your lungs to the rest of your body. This medicine may be used for other purposes; ask your health care provider or pharmacist if you have questions. COMMON BRAND NAME(S): Feraheme What should I tell my care team before I take this medication? They need to know if you have any of these conditions: Anemia not caused by low iron levels High levels of iron in the blood Magnetic resonance imaging (MRI) test scheduled An unusual or allergic reaction to iron, other medications, foods, dyes, or preservatives Pregnant or trying to get pregnant Breastfeeding How should I  use this medication? This medication is injected into a vein. It is given by your care team in a hospital or clinic setting. Talk to your care team the use of this medication in children. Special care may be needed. Overdosage: If you think you have taken too much of this medicine contact a poison control center or emergency room at once. NOTE: This medicine is only for you. Do not share this medicine with others. What if I miss a dose? It is important not to miss your dose. Call your care team if you are unable to keep an appointment. What may interact with this medication? Other iron products This list may not describe all possible interactions. Give your health care provider a list of all the medicines, herbs, non-prescription drugs, or dietary supplements you use. Also tell them if you smoke, drink alcohol, or use illegal drugs. Some items may interact with your medicine. What should I watch for while using this medication? Visit your care team regularly. Tell your care team if your symptoms do not start to get better or if they get worse. You may need blood work done while you are taking this medication. You may need to follow a special diet. Talk to your care team. Foods that contain iron include: whole grains/cereals, dried fruits, beans, or peas, leafy green vegetables, and organ meats (liver, kidney). What side effects may I notice from receiving this medication? Side effects that you should report to your care team as soon as possible: Allergic reactions--skin rash, itching, hives, swelling   of the face, lips, tongue, or throat Low blood pressure--dizziness, feeling faint or lightheaded, blurry vision Shortness of breath Side effects that usually do not require medical attention (report to your care team if they continue or are bothersome): Flushing Headache Joint pain Muscle pain Nausea Pain, redness, or irritation at injection site This list may not describe all possible side  effects. Call your doctor for medical advice about side effects. You may report side effects to FDA at 1-800-FDA-1088. Where should I keep my medication? This medication is given in a hospital or clinic and will not be stored at home. NOTE: This sheet is a summary. It may not cover all possible information. If you have questions about this medicine, talk to your doctor, pharmacist, or health care provider.  2024 Elsevier/Gold Standard (2021-10-01 00:00:00)       To help prevent nausea and vomiting after your treatment, we encourage you to take your nausea medication as directed.  BELOW ARE SYMPTOMS THAT SHOULD BE REPORTED IMMEDIATELY: *FEVER GREATER THAN 100.4 F (38 C) OR HIGHER *CHILLS OR SWEATING *NAUSEA AND VOMITING THAT IS NOT CONTROLLED WITH YOUR NAUSEA MEDICATION *UNUSUAL SHORTNESS OF BREATH *UNUSUAL BRUISING OR BLEEDING *URINARY PROBLEMS (pain or burning when urinating, or frequent urination) *BOWEL PROBLEMS (unusual diarrhea, constipation, pain near the anus) TENDERNESS IN MOUTH AND THROAT WITH OR WITHOUT PRESENCE OF ULCERS (sore throat, sores in mouth, or a toothache) UNUSUAL RASH, SWELLING OR PAIN  UNUSUAL VAGINAL DISCHARGE OR ITCHING   Items with * indicate a potential emergency and should be followed up as soon as possible or go to the Emergency Department if any problems should occur.  Please show the CHEMOTHERAPY ALERT CARD or IMMUNOTHERAPY ALERT CARD at check-in to the Emergency Department and triage nurse.  Should you have questions after your visit or need to cancel or reschedule your appointment, please contact [facility] [phone]  and follow the prompts.  Office hours are 8:00 a.m. to 4:30 p.m. Monday - Friday. Please note that voicemails left after 4:00 p.m. may not be returned until the following business day.  We are closed weekends and major holidays. You have access to a nurse at all times for urgent questions. Please call the main number  to the clinic [phone] and follow the prompts.  For any non-urgent questions, you may also contact your provider using MyChart. We now offer e-Visits for anyone 18 and older to request care online for non-urgent symptoms. For details visit [email or web address].   Also download the MyChart app! Go to the app store, search "MyChart", open the app, select [place], and log in with your MyChart username and password.   

## 2022-09-06 NOTE — Progress Notes (Signed)
Feraheme given today per MD orders. Tolerated infusion without adverse affects. Vital signs stable. No complaints at this time. Discharged from clinic ambulatory in stable condition. Alert and oriented x 3. F/U with [facility] as scheduled.  

## 2022-09-06 NOTE — Progress Notes (Signed)
Patient presents today for iron infusion.  Patient is in satisfactory condition with no new complaints voiced.  Vital signs are stable.  IV placed in right AC.  IV flushed well with good blood return noted.  We will proceed with infusion per provider orders.

## 2022-09-09 DIAGNOSIS — N170 Acute kidney failure with tubular necrosis: Secondary | ICD-10-CM | POA: Diagnosis not present

## 2022-09-09 DIAGNOSIS — N189 Chronic kidney disease, unspecified: Secondary | ICD-10-CM | POA: Diagnosis not present

## 2022-09-09 DIAGNOSIS — D638 Anemia in other chronic diseases classified elsewhere: Secondary | ICD-10-CM | POA: Diagnosis not present

## 2022-09-09 DIAGNOSIS — I129 Hypertensive chronic kidney disease with stage 1 through stage 4 chronic kidney disease, or unspecified chronic kidney disease: Secondary | ICD-10-CM | POA: Diagnosis not present

## 2022-09-09 DIAGNOSIS — E1122 Type 2 diabetes mellitus with diabetic chronic kidney disease: Secondary | ICD-10-CM | POA: Diagnosis not present

## 2022-09-12 ENCOUNTER — Ambulatory Visit
Admission: EM | Admit: 2022-09-12 | Discharge: 2022-09-12 | Disposition: A | Discharge status: Home / Self Care | Payer: Medicare Other | Attending: Nurse Practitioner | Admitting: Nurse Practitioner

## 2022-09-12 DIAGNOSIS — Z1152 Encounter for screening for COVID-19: Secondary | ICD-10-CM | POA: Insufficient documentation

## 2022-09-12 DIAGNOSIS — J069 Acute upper respiratory infection, unspecified: Secondary | ICD-10-CM | POA: Insufficient documentation

## 2022-09-12 MED ORDER — BENZONATATE 100 MG PO CAPS: 100.0000 mg | ORAL_CAPSULE | Freq: Three times a day (TID) | ORAL | 0 refills | Status: DC

## 2022-09-12 MED FILL — Ferumoxytol Inj 510 MG/17ML (30 MG/ML) (Elemental Fe): INTRAVENOUS | Qty: 17 | Status: AC

## 2022-09-12 NOTE — ED Provider Notes (Signed)
[facility]    CSN: [id number] Arrival date & time: 09/12/22  1201      History   Chief Complaint No chief complaint on file.   HPI Stacy Webb is a 77 y.o. female.   Patient presents today for 3-day history of dry cough, runny nose, sore throat, and decreased appetite.  She denies fever, body aches or chills, shortness of breath or chest pain, stuffy nose, congestion, headache, ear pain, abdominal pain, nausea/vomiting, and diarrhea.  Has been taking Mucinex without improvement.  Reports her sister-in-law was recently diagnosed with bronchitis and then go to the same charge.  Reports she took Mucinex cough drops which did seem to help with the sore throat.  Patient is mostly concerned about not being able to sleep well at night due to the coughing.     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)    Diabetes mellitus without complication (HCC)    Diabetic foot ulcer (HCC) 09/27/2016   Excessive hair growth 07/19/2015   Heart murmur    History of kidney stones    Hyperlipidemia    Hypertension    Neuromuscular disorder (HCC)    diabetic neuropathy   Neuropathy    Postmenopausal 07/19/2015    Patient Active Problem List   Diagnosis Date Noted   Iron deficiency anemia 08/20/2022   Diabetic peripheral neuropathy (HCC) 05/29/2022   Muscle spasms of lower extremity 05/03/2022   Cough 04/10/2022   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)    Breast cancer of upper-outer quadrant of left female breast (HCC) 11/12/2021   Cholelithiasis 10/29/2020   Rotator cuff arthropathy of left shoulder 07/10/2020   CKD (chronic kidney disease) stage 3, GFR 30-59 ml/min (HCC) 09/27/2016   Hyperlipidemia 09/27/2016   Essential hypertension, benign 08/11/2012    Past Surgical History:  Procedure Laterality Date   ANTERIOR AND  POSTERIOR REPAIR N/A 04/28/2018   Procedure: ANTERIOR (CYSTOCELE) AND POSTERIOR REPAIR (RECTOCELE);  Surgeon: Tilda Burrow, MD;  Location: [facility];  Service: Gynecology;  Laterality: N/A;   APPENDECTOMY     CARPAL TUNNEL RELEASE Bilateral 2001   CESAREAN SECTION     COLONOSCOPY N/A 08/28/2012   Procedure: COLONOSCOPY;  Surgeon: Malissa Hippo, MD;  Location: [facility];  Service: Endoscopy;  Laterality: N/A;  915   COLONOSCOPY N/A 12/25/2017   Procedure: COLONOSCOPY;  Surgeon: Malissa Hippo, MD;  Location: [facility];  Service: Endoscopy;  Laterality: N/A;  12:45   REVERSE SHOULDER ARTHROPLASTY Left 07/10/2020   Procedure: REVERSE SHOULDER ARTHROPLASTY;  Surgeon: Oliver Barre, MD;  Location: [facility];  Service: Orthopedics;  Laterality: Left;    OB History     Gravida  2   Para  2   Term  2   Preterm      AB      Living  1      SAB      IAB      Ectopic      Multiple  1   Live Births  3            Home Medications    Prior to Admission medications   Medication Sig Start Date End Date Taking? Authorizing Provider  benzonatate (TESSALON) 100 MG capsule Take 1 capsule (100  mg total) by mouth every 8 (eight) hours. Do not take with alcohol or while driving or operating heavy machinery.  May cause drowsiness. 09/12/22  Yes Cathlean Marseilles A, NP  atorvastatin (LIPITOR) 20 MG tablet TAKE 1 TABLET BY MOUTH DAILY 02/12/22   [name], DO  baclofen (LIORESAL) 10 MG tablet Take 1 tablet (10 mg total) by mouth 3 (three) times daily as needed for muscle spasms. 05/03/22   [name], DO  blood glucose meter kit and supplies Dispense based on patient and insurance preference. Use up to four times daily as directed. (FOR ICD-10 E10.9, E11.9). 03/21/21   Heather Roberts, NP  calcium-vitamin D (OSCAL WITH D) 500-5 MG-MCG tablet Take 1 tablet by mouth.    [provider]  diltiazem (CARDIZEM LA) 240 MG 24 hr tablet TAKE 1 TABLET BY MOUTH DAILY 07/15/22   Everlene Other G, DO  docusate sodium (COLACE) 100 MG capsule Take 100 mg by mouth at bedtime.    [provider]  glimepiride (AMARYL) 1 MG tablet Take 1 tablet (1 mg total) by mouth daily with breakfast. 05/03/22   [name], DO  glucose blood (ACCU-CHEK GUIDE) test strip Tests once a day 06/11/22   [name], DO  hydrochlorothiazide (HYDRODIURIL) 25 MG tablet TAKE 1 TABLET BY MOUTH DAILY 06/27/22   Kerri Perches, MD  tamoxifen (NOLVADEX) 10 MG tablet Take 1 tablet (10 mg total) by mouth daily. 08/20/22   [name], MD    Family History Family History  Problem Relation Age of Onset   Pneumonia Son    Suicidality Son    Diabetes Daughter    Colon cancer Paternal Aunt     Social History Social History   Tobacco Use   Smoking status: Never   Smokeless tobacco: Never  Vaping Use   Vaping Use: Never used  Substance Use Topics   Alcohol use: No   Drug use: No     Allergies   Clindamycin/lincomycin and Enalapril   Review of Systems Review of Systems Per HPI  Physical Exam Triage Vital Signs ED Triage Vitals  Enc Vitals Group     BP 09/12/22 1204 119/73     Pulse Rate 09/12/22 1204 100     Resp 09/12/22 1204 14     Temp 09/12/22 1204 98.4 F (36.9 C)     Temp Source 09/12/22 1204 Oral     SpO2 09/12/22 1204 93 %     Weight --      Height --      Head Circumference --      Peak Flow --      Pain Score 09/12/22 1207 7     Pain Loc --      Pain Edu? --      Excl. in GC? --    No data found.  Updated Vital Signs BP 119/73 (BP Location: Right Arm)   Pulse 100   Temp 98.4 F (36.9 C) (Oral)   Resp 14   SpO2 93%   Visual Acuity Right Eye Distance:   Left Eye Distance:   Bilateral Distance:    Right Eye Near:   Left Eye Near:    Bilateral Near:     Physical Exam Vitals and nursing note reviewed.  Constitutional:      General: She is not in acute distress.    Appearance: Normal appearance. She is not ill-appearing or  toxic-appearing.  HENT:     Head:  Normocephalic and atraumatic.     Right Ear: Tympanic membrane, ear canal and external ear normal.     Left Ear: Tympanic membrane, ear canal and external ear normal.     Nose: No congestion or rhinorrhea.     Mouth/Throat:     Mouth: Mucous membranes are moist.     Pharynx: Oropharynx is clear. Posterior oropharyngeal erythema present. No oropharyngeal exudate.  Eyes:     General: No scleral icterus.    Extraocular Movements: Extraocular movements intact.  Cardiovascular:     Rate and Rhythm: Normal rate and regular rhythm.  Pulmonary:     Effort: Pulmonary effort is normal. No respiratory distress.     Breath sounds: Normal breath sounds. No wheezing, rhonchi or rales.  Abdominal:     General: Abdomen is flat. Bowel sounds are normal. There is no distension.     Palpations: Abdomen is soft.  Musculoskeletal:     Cervical back: Normal range of motion and neck supple.  Lymphadenopathy:     Cervical: No cervical adenopathy.  Skin:    General: Skin is warm and dry.     Coloration: Skin is not jaundiced or pale.     Findings: No erythema or rash.  Neurological:     Mental Status: She is alert and oriented to person, place, and time.  Psychiatric:        Behavior: Behavior is cooperative.      UC Treatments / Results  Labs (all labs ordered are listed, but only abnormal results are displayed) Labs Reviewed  SARS CORONAVIRUS 2 (TAT 6-24 HRS)    EKG   Radiology No results found.  Procedures Procedures (including critical care time)  Medications Ordered in UC Medications - No data to display  Initial Impression / Assessment and Plan / UC Course  I have reviewed the triage vital signs and the nursing notes.  Pertinent labs & imaging results that were available during my care of the patient were reviewed by me and considered in my medical decision making (see chart for details).   Patient is well-appearing, normotensive, afebrile,  not tachycardic, not tachypneic, oxygenating well on room air.    1. Viral URI with cough 2. Encounter for screening for COVID-19 Suspect viral etiology Vitals and exam today are reassuring COVID-19 test is pending Patient is a candidate for molnupiravir if she tests positive for COVID-19 Supportive care discussed with patient ER and return precautions also discussed  The patient was given the opportunity to ask questions.  All questions answered to their satisfaction.  The patient is in agreement to this plan.    Final Clinical Impressions(s) / UC Diagnoses   Final diagnoses:  Viral URI with cough  Encounter for screening for COVID-19     Discharge Instructions      You have a viral upper respiratory infection.  Symptoms should improve over the next week to 10 days.  If you develop chest pain or shortness of breath, go to the emergency room.  We have tested you today for COVID-19.  You will see the results in Mychart and we will call you with positive results.  Please stay home and isolate until you are aware of the results.    Some things that can make you feel better are: - Increased rest - Increasing fluid with water/sugar free electrolytes - Acetaminophen and ibuprofen as needed for fever/pain - Salt water gargling, chloraseptic spray and throat lozenges - OTC guaifenesin (Mucinex) 600 mg twice daily - Saline  sinus flushes or a neti pot - Humidifying the air -Tessalon Perles as needed for dry cough     ED Prescriptions     Medication Sig Dispense Auth. Provider   benzonatate (TESSALON) 100 MG capsule Take 1 capsule (100 mg total) by mouth every 8 (eight) hours. Do not take with alcohol or while driving or operating heavy machinery.  May cause drowsiness. 21 capsule Valentino Nose, NP      PDMP not reviewed this encounter.   Valentino Nose, NP 09/12/22 1504

## 2022-09-12 NOTE — ED Triage Notes (Signed)
Pt c/o cough and sore throat x 3 days pt sates she noticed the cough started on Monday night and the sore throat shortly after.

## 2022-09-12 NOTE — Discharge Instructions (Addendum)
You have a viral upper respiratory infection.  Symptoms should improve over the next week to 10 days.  If you develop chest pain or shortness of breath, go to the emergency room.  We have tested you today for COVID-19.  You will see the results in Mychart and we will call you with positive results.  Please stay home and isolate until you are aware of the results.    Some things that can make you feel better are: - Increased rest - Increasing fluid with water/sugar free electrolytes - Acetaminophen and ibuprofen as needed for fever/pain - Salt water gargling, chloraseptic spray and throat lozenges - OTC guaifenesin (Mucinex) 600 mg twice daily - Saline sinus flushes or a neti pot - Humidifying the air -Tessalon Perles as needed for dry cough

## 2022-09-13 ENCOUNTER — Inpatient Hospital Stay: Payer: Medicare Other | Attending: Hematology

## 2022-09-13 VITALS — BP 142/82 | HR 67 | Temp 98.6°F | Resp 18

## 2022-09-13 DIAGNOSIS — D509 Iron deficiency anemia, unspecified: Secondary | ICD-10-CM

## 2022-09-13 DIAGNOSIS — C50412 Malignant neoplasm of upper-outer quadrant of left female breast: Secondary | ICD-10-CM | POA: Insufficient documentation

## 2022-09-13 DIAGNOSIS — K5900 Constipation, unspecified: Secondary | ICD-10-CM | POA: Diagnosis not present

## 2022-09-13 DIAGNOSIS — Z825 Family history of asthma and other chronic lower respiratory diseases: Secondary | ICD-10-CM | POA: Diagnosis not present

## 2022-09-13 DIAGNOSIS — Z833 Family history of diabetes mellitus: Secondary | ICD-10-CM | POA: Diagnosis not present

## 2022-09-13 DIAGNOSIS — G479 Sleep disorder, unspecified: Secondary | ICD-10-CM | POA: Diagnosis not present

## 2022-09-13 DIAGNOSIS — D649 Anemia, unspecified: Secondary | ICD-10-CM | POA: Diagnosis not present

## 2022-09-13 DIAGNOSIS — Z79899 Other long term (current) drug therapy: Secondary | ICD-10-CM | POA: Insufficient documentation

## 2022-09-13 DIAGNOSIS — Z170 Estrogen receptor positive status [ER+]: Secondary | ICD-10-CM | POA: Insufficient documentation

## 2022-09-13 DIAGNOSIS — Z800 Family history of malignant neoplasm of digestive organs: Secondary | ICD-10-CM | POA: Diagnosis not present

## 2022-09-13 DIAGNOSIS — Z818 Family history of other mental and behavioral disorders: Secondary | ICD-10-CM | POA: Insufficient documentation

## 2022-09-13 LAB — SARS CORONAVIRUS 2 (TAT 6-24 HRS): SARS Coronavirus 2: NEGATIVE

## 2022-09-13 MED ORDER — SODIUM CHLORIDE 0.9 % IV SOLN
510.0000 mg | Freq: Once | INTRAVENOUS | Status: AC
  Administered 2022-09-13: 510 mg via INTRAVENOUS
  Filled 2022-09-13: qty 510

## 2022-09-13 MED ORDER — SODIUM CHLORIDE 0.9 % IV SOLN: Freq: Once | INTRAVENOUS | Status: AC

## 2022-09-13 NOTE — Telephone Encounter (Signed)
Error

## 2022-09-13 NOTE — Patient Instructions (Signed)
[facility]  Discharge Instructions: Thank you for choosing [facility] to provide your oncology and hematology care.  If you have a lab appointment with the Cancer Center - please note that after April 8th, 2024, all labs will be drawn in the cancer center.  You do not have to check in or register with the main entrance as you have in the past but will complete your check-in in the cancer center.  Wear comfortable clothing and clothing appropriate for easy access to any Portacath or PICC line.   We strive to give you quality time with your provider. You may need to reschedule your appointment if you arrive late (15 or more minutes).  Arriving late affects you and other patients whose appointments are after yours.  Also, if you miss three or more appointments without notifying the office, you may be dismissed from the clinic at the provider's discretion.      For prescription refill requests, have your pharmacy contact our office and allow 72 hours for refills to be completed.    Today you received the following chemotherapy and/or immunotherapy agents Feraheme      To help prevent nausea and vomiting after your treatment, we encourage you to take your nausea medication as directed.  BELOW ARE SYMPTOMS THAT SHOULD BE REPORTED IMMEDIATELY: *FEVER GREATER THAN 100.4 F (38 C) OR HIGHER *CHILLS OR SWEATING *NAUSEA AND VOMITING THAT IS NOT CONTROLLED WITH YOUR NAUSEA MEDICATION *UNUSUAL SHORTNESS OF BREATH *UNUSUAL BRUISING OR BLEEDING *URINARY PROBLEMS (pain or burning when urinating, or frequent urination) *BOWEL PROBLEMS (unusual diarrhea, constipation, pain near the anus) TENDERNESS IN MOUTH AND THROAT WITH OR WITHOUT PRESENCE OF ULCERS (sore throat, sores in mouth, or a toothache) UNUSUAL RASH, SWELLING OR PAIN  UNUSUAL VAGINAL DISCHARGE OR ITCHING   Items with * indicate a potential emergency and should be followed up as soon as possible or go to the  Emergency Department if any problems should occur.  Please show the CHEMOTHERAPY ALERT CARD or IMMUNOTHERAPY ALERT CARD at check-in to the Emergency Department and triage nurse.  Should you have questions after your visit or need to cancel or reschedule your appointment, please contact [facility] [place] [phone]  and follow the prompts.  Office hours are 8:00 a.m. to 4:30 p.m. Monday - Friday. Please note that voicemails left after 4:00 p.m. may not be returned until the following business day.  We are closed weekends and major holidays. You have access to a nurse at all times for urgent questions. Please call the main number to the clinic [phone] and follow the prompts.  For any non-urgent questions, you may also contact your provider using MyChart. We now offer e-Visits for anyone 18 and older to request care online for non-urgent symptoms. For details visit [email or web address].   Also download the MyChart app! Go to the app store, search "MyChart", open the app, select [place], and log in with your MyChart username and password.   

## 2022-09-13 NOTE — Progress Notes (Signed)
Patient tolerated her iron infusion without incidence. She was discharged ambulatory and in stable condition to self.  She is aware of her next appointments.

## 2022-09-18 DIAGNOSIS — E1122 Type 2 diabetes mellitus with diabetic chronic kidney disease: Secondary | ICD-10-CM | POA: Diagnosis not present

## 2022-09-18 DIAGNOSIS — I5032 Chronic diastolic (congestive) heart failure: Secondary | ICD-10-CM | POA: Diagnosis not present

## 2022-09-18 DIAGNOSIS — I129 Hypertensive chronic kidney disease with stage 1 through stage 4 chronic kidney disease, or unspecified chronic kidney disease: Secondary | ICD-10-CM | POA: Diagnosis not present

## 2022-09-18 DIAGNOSIS — D638 Anemia in other chronic diseases classified elsewhere: Secondary | ICD-10-CM | POA: Diagnosis not present

## 2022-09-20 ENCOUNTER — Ambulatory Visit: Payer: Medicare Other | Admitting: Nurse Practitioner

## 2022-09-23 ENCOUNTER — Ambulatory Visit (INDEPENDENT_AMBULATORY_CARE_PROVIDER_SITE_OTHER): Payer: Medicare Other | Admitting: Family Medicine

## 2022-09-23 ENCOUNTER — Encounter: Payer: Self-pay | Admitting: Family Medicine

## 2022-09-23 VITALS — BP 133/73 | HR 92 | Temp 97.7°F | Ht 66.0 in | Wt 143.0 lb

## 2022-09-23 DIAGNOSIS — R159 Full incontinence of feces: Secondary | ICD-10-CM

## 2022-09-23 DIAGNOSIS — J0100 Acute maxillary sinusitis, unspecified: Secondary | ICD-10-CM

## 2022-09-23 DIAGNOSIS — R32 Unspecified urinary incontinence: Secondary | ICD-10-CM

## 2022-09-23 DIAGNOSIS — J329 Chronic sinusitis, unspecified: Secondary | ICD-10-CM | POA: Insufficient documentation

## 2022-09-23 MED ORDER — HYDROCODONE BIT-HOMATROP MBR 5-1.5 MG/5ML PO SOLN: 5.0000 mL | Freq: Three times a day (TID) | ORAL | 0 refills | Status: DC | PRN

## 2022-09-23 MED ORDER — DOXYCYCLINE HYCLATE 100 MG PO TABS
100.0000 mg | ORAL_TABLET | Freq: Two times a day (BID) | ORAL | 0 refills | Status: DC
Start: 2022-09-23 — End: 2022-10-29

## 2022-09-23 NOTE — Assessment & Plan Note (Signed)
Treating with Doxy. Hycodan for cough.

## 2022-09-23 NOTE — Patient Instructions (Signed)
Medications as prescribed. ° °Take care ° °Dr. Trino Higinbotham  °

## 2022-09-23 NOTE — Progress Notes (Signed)
Subjective:  Patient ID: Stacy Webb, female    DOB: Nov 15, 1945  Age: 77 y.o. MRN: [id number]  CC: Chief Complaint  Patient presents with   Follow-up    From urgent care- still has nasal congestion and some drainage , cough- causing urge to vomit - states received an injection and tessalon pearls- blood sugars were elevated at 200's in the morning     HPI:  77 year old female with type 2 diabetes with complications, CKD, history of breast cancer, hyperlipidemia presents for evaluation of the above.  Patient reports persistent respiratory symptoms.  She was recently seen in urgent care on 6/6.  She states that her symptoms have continued to persist despite treatment.  She reports sinus congestion and pressure.  Also reports cough which is worse at night.  No fever.  She states that her mucus is discolored.  She is concerned given the lack of improvement.  Denies any issues with allergies.  Patient also reports that she is having urinary and fecal incontinence.  She has had this previously and responded well to pelvic PT.  However, she states that she does not want to drive all the way back to [facility] if she can avoid it.  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)    Breast cancer of upper-outer quadrant of left female breast (HCC) 11/12/2021   Cholelithiasis 10/29/2020   Rotator cuff arthropathy of left shoulder 07/10/2020   CKD (chronic kidney disease) stage 3, GFR 30-59 ml/min (HCC) 09/27/2016   Hyperlipidemia 09/27/2016   Essential hypertension, benign 08/11/2012    Social Hx   Social History   Socioeconomic History   Marital status: Widowed    Spouse name: Not on file   Number of children: 3   Years  of education: Not on file   Highest education level: Not on file  Occupational History   Occupation: [employer]    Comment: Medical Records  Tobacco Use   Smoking status: Never   Smokeless tobacco: Never  Vaping Use   Vaping Use: Never used  Substance and Sexual Activity   Alcohol use: No   Drug use: No   Sexual activity: Not Currently    Birth control/protection: Post-menopausal  Other Topics Concern   Not on file  Social History Narrative   Husband had ALS and passed away.Twin boys, but one died at 24 months and her other son died in October 09, 2014.      Living daughter- lives with pt.   Social Determinants of Health   Financial Resource Strain: Low Risk  (10/02/2021)   Overall Financial Resource Strain (CARDIA)    Difficulty of Paying Living Expenses: Not hard at all  Food Insecurity: No Food Insecurity (10/02/2021)   Hunger Vital Sign    Worried About Running Out of Food in the Last Year: Never true    Ran Out of Food in the Last Year: Never true  Transportation Needs: No Transportation Needs (10/02/2021)   PRAPARE - Administrator, Civil Service (Medical): No    Lack of Transportation (Non-Medical): No  Physical Activity: Insufficiently Active (10/02/2021)   Exercise Vital Sign    Days of Exercise per Week: 2 days    Minutes of Exercise per Session: 60  min  Stress: No Stress Concern Present (10/02/2021)   [employer] of Occupational Health - Occupational Stress Questionnaire    Feeling of Stress : Not at all  Social Connections: Socially Integrated (10/02/2021)   Social Connection and Isolation Panel [NHANES]    Frequency of Communication with Friends and Family: More than three times a week    Frequency of Social Gatherings with Friends and Family: More than three times a week    Attends Religious Services: More than 4 times per year    Active Member of [employer] or Organizations: Yes    Attends Engineer, structural: More than 4 times per year    Marital Status:  Married    Review of Systems Per HPI  Objective:  BP 133/73   Pulse 92   Temp 97.7 F (36.5 C)   Ht 5\' 6"  (1.676 m)   Wt 143 lb (64.9 kg)   SpO2 98%   BMI 23.08 kg/m      09/23/2022    4:26 PM 09/23/2022    3:44 PM 09/13/2022   11:50 AM  BP/Weight  Systolic BP 133 164 142  Diastolic BP 73 84 82  Wt. (Lbs)  143   BMI  23.08 kg/m2     Physical Exam Vitals and nursing note reviewed.  Constitutional:      General: She is not in acute distress.    Appearance: Normal appearance.  HENT:     Head: Normocephalic and atraumatic.     Right Ear: Tympanic membrane normal.     Left Ear: Tympanic membrane normal.     Nose: Congestion present.     Mouth/Throat:     Pharynx: Oropharynx is clear.  Cardiovascular:     Rate and Rhythm: Normal rate and regular rhythm.  Pulmonary:     Effort: Pulmonary effort is normal.     Breath sounds: Normal breath sounds.  Neurological:     Mental Status: She is alert.     Lab Results  Component Value Date   WBC 9.0 08/13/2022   HGB 9.8 (L) 08/13/2022   HCT 29.9 (L) 08/13/2022   PLT 257 08/13/2022   GLUCOSE 110 (H) 08/13/2022   CHOL 146 03/02/2021   TRIG 79 03/02/2021   HDL 58 03/02/2021   LDLCALC 73 03/02/2021   ALT 24 08/13/2022   AST 30 08/13/2022   NA 137 08/13/2022   K 3.2 (L) 08/13/2022   CL 102 08/13/2022   CREATININE 1.74 (H) 08/13/2022   BUN 45 (H) 08/13/2022   CO2 24 08/13/2022   HGBA1C 6.5 (H) 02/26/2022     Assessment & Plan:   Problem List Items Addressed This Visit       Respiratory   Sinusitis - Primary    Treating with Doxy. Hycodan for cough.       Relevant Medications   doxycycline (VIBRA-TABS) 100 MG tablet   HYDROcodone bit-homatropine (HYCODAN) 5-1.5 MG/5ML syrup     Other   Urinary and fecal incontinence    Will reach out GYN.  Needs pelvic PT. Unsure if this is available here in [place].        Meds ordered this encounter  Medications   doxycycline (VIBRA-TABS) 100 MG tablet     Sig: Take 1 tablet (100 mg total) by mouth 2 (two) times daily.    Dispense:  14 tablet    Refill:  0   HYDROcodone bit-homatropine (HYCODAN) 5-1.5 MG/5ML syrup    Sig: Take 5 mLs by mouth  every 8 (eight) hours as needed for cough.    Dispense:  120 mL    Refill:  0    Opie Maclaughlin DO [facility]

## 2022-09-23 NOTE — Assessment & Plan Note (Signed)
Will reach out GYN.  Needs pelvic PT. Unsure if this is available here in [place].

## 2022-10-02 DIAGNOSIS — M14672 Charcot's joint, left ankle and foot: Secondary | ICD-10-CM | POA: Diagnosis not present

## 2022-10-02 DIAGNOSIS — L603 Nail dystrophy: Secondary | ICD-10-CM | POA: Diagnosis not present

## 2022-10-02 DIAGNOSIS — L97519 Non-pressure chronic ulcer of other part of right foot with unspecified severity: Secondary | ICD-10-CM | POA: Diagnosis not present

## 2022-10-02 DIAGNOSIS — E1140 Type 2 diabetes mellitus with diabetic neuropathy, unspecified: Secondary | ICD-10-CM | POA: Diagnosis not present

## 2022-10-07 ENCOUNTER — Telehealth: Payer: Self-pay

## 2022-10-07 MED ORDER — ATORVASTATIN CALCIUM 20 MG PO TABS: ORAL_TABLET | ORAL | 0 refills | Status: DC

## 2022-10-07 NOTE — Telephone Encounter (Signed)
Received via fax Rx request: Prescription sent electronically to pharmacy  

## 2022-10-07 NOTE — Telephone Encounter (Signed)
Prescription Request  10/07/2022  LOV: Visit date not found  What is the name of the medication or equipment? atorvastatin (LIPITOR) 20 MG tablet  need 90 day supply   Have you contacted your pharmacy to request a refill? Yes   Which pharmacy would you like this sent to? [facility]    Patient notified that their request is being sent to the clinical staff for review and that they should receive a response within 2 business days.   Please advise at Mobile [phone] (mobile)

## 2022-10-15 ENCOUNTER — Other Ambulatory Visit: Payer: Self-pay | Admitting: *Deleted

## 2022-10-15 MED ORDER — TAMOXIFEN CITRATE 10 MG PO TABS: 10.0000 mg | ORAL_TABLET | Freq: Every day | ORAL | 3 refills | Status: DC

## 2022-10-15 NOTE — Telephone Encounter (Signed)
Tamoxifen refill approved.  Patient is tolerating and is to continue therapy. 

## 2022-10-16 DIAGNOSIS — L603 Nail dystrophy: Secondary | ICD-10-CM | POA: Diagnosis not present

## 2022-10-22 ENCOUNTER — Ambulatory Visit (HOSPITAL_COMMUNITY)
Admission: RE | Admit: 2022-10-22 | Discharge: 2022-10-22 | Disposition: A | Discharge status: Home / Self Care | Payer: Medicare Other | Source: Ambulatory Visit | Attending: Hematology | Admitting: Hematology

## 2022-10-22 ENCOUNTER — Encounter (HOSPITAL_COMMUNITY): Payer: Self-pay

## 2022-10-22 DIAGNOSIS — R92333 Mammographic heterogeneous density, bilateral breasts: Secondary | ICD-10-CM | POA: Diagnosis not present

## 2022-10-22 DIAGNOSIS — C50412 Malignant neoplasm of upper-outer quadrant of left female breast: Secondary | ICD-10-CM | POA: Insufficient documentation

## 2022-10-22 DIAGNOSIS — Z170 Estrogen receptor positive status [ER+]: Secondary | ICD-10-CM | POA: Diagnosis not present

## 2022-10-22 DIAGNOSIS — Z853 Personal history of malignant neoplasm of breast: Secondary | ICD-10-CM

## 2022-10-28 DIAGNOSIS — M79671 Pain in right foot: Secondary | ICD-10-CM | POA: Diagnosis not present

## 2022-10-28 DIAGNOSIS — M79672 Pain in left foot: Secondary | ICD-10-CM | POA: Diagnosis not present

## 2022-10-28 DIAGNOSIS — I739 Peripheral vascular disease, unspecified: Secondary | ICD-10-CM | POA: Diagnosis not present

## 2022-10-28 DIAGNOSIS — E1140 Type 2 diabetes mellitus with diabetic neuropathy, unspecified: Secondary | ICD-10-CM | POA: Diagnosis not present

## 2022-10-28 DIAGNOSIS — L110 Acquired keratosis follicularis: Secondary | ICD-10-CM | POA: Diagnosis not present

## 2022-10-29 ENCOUNTER — Ambulatory Visit (INDEPENDENT_AMBULATORY_CARE_PROVIDER_SITE_OTHER): Payer: Medicare Other | Admitting: Family Medicine

## 2022-10-29 VITALS — BP 140/76 | HR 75 | Temp 98.1°F | Ht 66.0 in | Wt 145.8 lb

## 2022-10-29 DIAGNOSIS — E1122 Type 2 diabetes mellitus with diabetic chronic kidney disease: Secondary | ICD-10-CM

## 2022-10-29 DIAGNOSIS — E785 Hyperlipidemia, unspecified: Secondary | ICD-10-CM | POA: Diagnosis not present

## 2022-10-29 DIAGNOSIS — I10 Essential (primary) hypertension: Secondary | ICD-10-CM

## 2022-10-29 DIAGNOSIS — N1832 Chronic kidney disease, stage 3b: Secondary | ICD-10-CM

## 2022-10-29 DIAGNOSIS — F419 Anxiety disorder, unspecified: Secondary | ICD-10-CM | POA: Insufficient documentation

## 2022-10-29 NOTE — Assessment & Plan Note (Signed)
We discussed SGLT2.  Awaiting A1c.  Awaiting renal function.

## 2022-10-29 NOTE — Assessment & Plan Note (Signed)
Patient concerned about progression to advanced kidney disease and hemodialysis.  Her risk is low at this time.  We discussed therapies that protect the kidneys including ARB, SGLT2, Chauncey Mann. She will consider.

## 2022-10-29 NOTE — Assessment & Plan Note (Signed)
Offer treatment.  Patient wants to wait.

## 2022-10-29 NOTE — Patient Instructions (Addendum)
Labs today.  Follow up in 3 months.   Consider ARB (Losartan, Valsartan, etc).  Consider [place] or Jardiance.

## 2022-10-29 NOTE — Progress Notes (Signed)
Subjective:  Patient ID: Stacy Webb, female    DOB: July 23, 1945  Age: 77 y.o. MRN: [id number]  CC: Chief Complaint  Patient presents with   Med Check    HPI:  77 year old female with hypertension, type 2 diabetes with complications including kidney disease, neuropathy, and Charcot foot, history of breast cancer, hyperlipidemia, iron deficiency anemia presents for follow-up.  Patient reports recent difficulty sleeping due to anxiety/worry.  Patient overall has been doing fairly well.  Patient states that the last time she saw her nephrologist, he mentioned that she was approaching dialysis.  I suspect that there was miscommunication as patient has been stable with chronic kidney disease stage III.  Will reassess creatinine today.  Most recent creatinine of 1.74 with a GFR of 30 on 08/13/2022.  Patient's blood pressure is slightly elevated here today.  She is on hydrochlorothiazide and diltiazem.  We discussed stopping diltiazem and starting ARB.  Patient is concerned about the progression of kidney disease.  Will discuss SGLT2.  Blood sugars have been well-controlled on low-dose glimepiride.  No hypoglycemia.  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)    Breast cancer of upper-outer quadrant of left female breast (HCC) 11/12/2021   Cholelithiasis 10/29/2020   Rotator cuff arthropathy of left shoulder 07/10/2020   CKD (chronic kidney disease) stage 3, GFR 30-59 ml/min (HCC) 09/27/2016   Hyperlipidemia 09/27/2016   Essential hypertension, benign 08/11/2012    Social Hx   Social History   Socioeconomic History   Marital status: Widowed    Spouse name: Not on file   Number of children: 3   Years of education:  Not on file   Highest education level: Not on file  Occupational History   Occupation: [employer]    Comment: Medical Records  Tobacco Use   Smoking status: Never   Smokeless tobacco: Never  Vaping Use   Vaping status: Never Used  Substance and Sexual Activity   Alcohol use: No   Drug use: No   Sexual activity: Not Currently    Birth control/protection: Post-menopausal  Other Topics Concern   Not on file  Social History Narrative   Husband had ALS and passed away.Twin boys, but one died at 3 months and her other son died in 11/28/2014.      Living daughter- lives with pt.   Social Determinants of Health   Financial Resource Strain: Low Risk  (10/02/2021)   Overall Financial Resource Strain (CARDIA)    Difficulty of Paying Living Expenses: Not hard at all  Food Insecurity: No Food Insecurity (10/02/2021)   Hunger Vital Sign    Worried About Running Out of Food in the Last Year: Never true    Ran Out of Food in the Last Year: Never true  Transportation Needs: No Transportation Needs (10/02/2021)   PRAPARE - Administrator, Civil Service (Medical): No    Lack of Transportation (Non-Medical): No  Physical Activity: Insufficiently Active (10/02/2021)   Exercise Vital Sign    Days of Exercise per Week: 2 days    Minutes of Exercise per Session: 60 min  Stress: No Stress Concern Present (10/02/2021)   [employer] of Occupational Health - Occupational Stress Questionnaire    Feeling of  Stress : Not at all  Social Connections: Socially Integrated (10/02/2021)   Social Connection and Isolation Panel [NHANES]    Frequency of Communication with Friends and Family: More than three times a week    Frequency of Social Gatherings with Friends and Family: More than three times a week    Attends Religious Services: More than 4 times per year    Active Member of [employer] or Organizations: Yes    Attends Engineer, structural: More than 4 times per year    Marital Status: Married     Review of Systems Per HPI  Objective:  BP (!) 140/76   Pulse 75   Temp 98.1 F (36.7 C)   Ht 5\' 6"  (1.676 m)   Wt 145 lb 12.8 oz (66.1 kg)   SpO2 99%   BMI 23.53 kg/m      10/29/2022    2:41 PM 10/29/2022    1:56 PM 09/23/2022    4:26 PM  BP/Weight  Systolic BP 140 158 133  Diastolic BP 76 76 73  Wt. (Lbs)  145.8   BMI  23.53 kg/m2     Physical Exam Constitutional:      General: She is not in acute distress.    Appearance: Normal appearance.  HENT:     Head: Normocephalic and atraumatic.  Eyes:     General:        Right eye: No discharge.        Left eye: No discharge.     Conjunctiva/sclera: Conjunctivae normal.  Cardiovascular:     Rate and Rhythm: Normal rate and regular rhythm.     Heart sounds: Murmur heard.  Pulmonary:     Effort: Pulmonary effort is normal.     Breath sounds: Normal breath sounds. No wheezing, rhonchi or rales.  Feet:     Comments: Diabetic foot exam performed today. Normal pulses.  Charcot foot noted.   Cannot feel monofilament due to neuropathy. Neurological:     Mental Status: She is alert.  Psychiatric:     Comments: Anxious.     Lab Results  Component Value Date   WBC 9.0 08/13/2022   HGB 9.8 (L) 08/13/2022   HCT 29.9 (L) 08/13/2022   PLT 257 08/13/2022   GLUCOSE 110 (H) 08/13/2022   CHOL 146 03/02/2021   TRIG 79 03/02/2021   HDL 58 03/02/2021   LDLCALC 73 03/02/2021   ALT 24 08/13/2022   AST 30 08/13/2022   NA 137 08/13/2022   K 3.2 (L) 08/13/2022   CL 102 08/13/2022   CREATININE 1.74 (H) 08/13/2022   BUN 45 (H) 08/13/2022   CO2 24 08/13/2022   HGBA1C 6.5 (H) 02/26/2022     Assessment & Plan:   Problem List Items Addressed This Visit       Cardiovascular and Mediastinum   Essential hypertension, benign    We discussed change in therapy with discontinuation of diltiazem and addition of ARB.  Patient will consider.  Labs today.        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  Awaiting A1c.  Awaiting renal function.      Relevant Orders   CMP14+EGFR   Hemoglobin A1c   Microalbumin / creatinine urine ratio     Genitourinary   CKD (chronic kidney disease) stage 3, GFR 30-59 ml/min (HCC)    Patient concerned about progression to advanced kidney disease and hemodialysis.  Her  risk is low at this time.  We discussed therapies that protect the kidneys including ARB, SGLT2, Chauncey Mann. She will consider.      Relevant Orders   Phosphorus     Other   Hyperlipidemia   Relevant Orders   Lipid panel   Anxiety    Offer treatment.  Patient wants to wait.      Follow-up:  Return in about 3 months (around 01/29/2023).  Everlene Other DO [facility]

## 2022-10-29 NOTE — Assessment & Plan Note (Signed)
We discussed change in therapy with discontinuation of diltiazem and addition of ARB.  Patient will consider.  Labs today.

## 2022-10-30 ENCOUNTER — Ambulatory Visit (INDEPENDENT_AMBULATORY_CARE_PROVIDER_SITE_OTHER): Payer: Medicare Other | Admitting: Obstetrics & Gynecology

## 2022-10-30 ENCOUNTER — Encounter: Payer: Self-pay | Admitting: Obstetrics & Gynecology

## 2022-10-30 VITALS — BP 161/80 | HR 63 | Ht 66.0 in | Wt 143.2 lb

## 2022-10-30 DIAGNOSIS — E785 Hyperlipidemia, unspecified: Secondary | ICD-10-CM | POA: Diagnosis not present

## 2022-10-30 DIAGNOSIS — F419 Anxiety disorder, unspecified: Secondary | ICD-10-CM | POA: Diagnosis not present

## 2022-10-30 DIAGNOSIS — R159 Full incontinence of feces: Secondary | ICD-10-CM | POA: Diagnosis not present

## 2022-10-30 DIAGNOSIS — E1122 Type 2 diabetes mellitus with diabetic chronic kidney disease: Secondary | ICD-10-CM | POA: Diagnosis not present

## 2022-10-30 DIAGNOSIS — N3946 Mixed incontinence: Secondary | ICD-10-CM | POA: Diagnosis not present

## 2022-10-30 DIAGNOSIS — N814 Uterovaginal prolapse, unspecified: Secondary | ICD-10-CM | POA: Diagnosis not present

## 2022-10-30 DIAGNOSIS — K5900 Constipation, unspecified: Secondary | ICD-10-CM | POA: Diagnosis not present

## 2022-10-30 DIAGNOSIS — N1832 Chronic kidney disease, stage 3b: Secondary | ICD-10-CM | POA: Diagnosis not present

## 2022-10-30 NOTE — Progress Notes (Signed)
GYN VISIT Patient name: Stacy Webb MRN [id number]  Date of birth: 1945/06/03 Chief Complaint:   Follow-up (Wants to go back to pelvic floor therapy)  History of Present Illness:   Stacy Webb is a 77 y.o. G2P2001 PM female being seen today for follow up regarding:  -Last seen January 2023- today she notes that she felt as though the pelvic floor exercises did help however, she lost the exercise sheet and wishes to be referred back.  In review she still notes a small BM when she urinates on occasion.  She does feel like there is a small egg or bulge and expresses concern that she thinks this may fall out.  + stress incontinence + urge incontinence Also notes occasional fecal incontinence, which has only happened a few times.  Not sexually active  Still reports difficulty with constipation and states that she takes prune juice daily to help with this issue  No LMP recorded. Patient is postmenopausal.    Review of Systems:   Pertinent items are noted in HPI Denies fever/chills, dizziness, headaches, visual disturbances, fatigue, shortness of breath, chest pain, abdominal pain, vomiting. Pertinent History Reviewed:   Past Surgical History:  Procedure Laterality Date   ANTERIOR AND POSTERIOR REPAIR N/A 04/28/2018   Procedure: ANTERIOR (CYSTOCELE) AND POSTERIOR REPAIR (RECTOCELE);  Surgeon: Tilda Burrow, MD;  Location: [facility];  Service: Gynecology;  Laterality: N/A;   APPENDECTOMY     BREAST BIOPSY Left 2023   invasive ca   BREAST LUMPECTOMY Left 2023   invasive ca   CARPAL TUNNEL RELEASE Bilateral 2001   CESAREAN SECTION     COLONOSCOPY N/A 08/28/2012   Procedure: COLONOSCOPY;  Surgeon: Malissa Hippo, MD;  Location: [facility];  Service: Endoscopy;  Laterality: N/A;  915   COLONOSCOPY N/A 12/25/2017   Procedure: COLONOSCOPY;  Surgeon: Malissa Hippo, MD;  Location: [facility];  Service: Endoscopy;  Laterality: N/A;  12:45   REVERSE SHOULDER ARTHROPLASTY Left  07/10/2020   Procedure: REVERSE SHOULDER ARTHROPLASTY;  Surgeon: Oliver Barre, MD;  Location: [facility];  Service: Orthopedics;  Laterality: Left;    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 Physical Assessment:   Vitals:   10/30/22 1126  BP: (!) 161/80  Pulse: 63  Weight: 143 lb 3.2 oz (65 kg)  Height: 5\' 6"  (1.676 m)  Body mass index is 23.11 kg/m.       Physical Examination:   General appearance: alert, well appearing, and in no distress  Psych: mood appropriate, normal affect  Skin: warm & dry   Cardiovascular: normal heart rate noted  Respiratory: normal respiratory effort, no distress  Abdomen: soft, non-tender, no rebound, no guarding  Pelvic: VULVA: normal appearing vulva with no masses, tenderness or lesions, VAGINA: normal appearing vagina with normal color and discharge, no lesions, CERVIX: normal appearing cervix without discharge or lesions, UTERUS: uterus is normal size, shape, consistency and nontender.  Stage 2 uterine  prolapse appreciated  Extremities: no edema   Chaperone: Faith Rogue    -Pessary trial completed:  Trial of Ring with support #3 and #4.  Per pt, #4 was more uncomfortable.  Denies slippage with ring #3 Assessment & Plan:  1) uterine prolapse, mixed incontinence, anxiety -Referral to pelvic floor therapy completed -Discussed medical management on of incontinence, which she declined -Discussed referral to urogynecology, which she declined -After much discussion patient agreeable to pessary trial.  Pessary  trial completed as above and fitted for a ring #3.  After much review of potential benefits patient wishes to hold off for pessary at this time -Strongly encourage patient to reconsider options, follow-up as needed  Orders Placed This Encounter  Procedures   AMB referral to rehabilitation    Return if symptoms worsen or fail to improve.   Myna Hidalgo, DO Attending Obstetrician & Gynecologist, [facility] for [employer], [facility]

## 2022-10-31 LAB — CMP14+EGFR
ALT: 20 IU/L (ref 0–32)
AST: 31 IU/L (ref 0–40)
Albumin: 4.4 g/dL (ref 3.8–4.8)
BUN/Creatinine Ratio: 18 (ref 12–28)
BUN: 33 mg/dL — ABNORMAL HIGH (ref 8–27)
Bilirubin Total: 0.3 mg/dL (ref 0.0–1.2)
Calcium: 9.2 mg/dL (ref 8.7–10.3)
Creatinine, Ser: 1.85 mg/dL — ABNORMAL HIGH (ref 0.57–1.00)
Globulin, Total: 2.4 g/dL (ref 1.5–4.5)
Glucose: 116 mg/dL — ABNORMAL HIGH (ref 70–99)
Sodium: 144 mmol/L (ref 134–144)
eGFR: 28 mL/min/{1.73_m2} — ABNORMAL LOW (ref >59)

## 2022-10-31 LAB — MICROALBUMIN / CREATININE URINE RATIO: Creatinine, Urine: 120.9 mg/dL

## 2022-10-31 LAB — LIPID PANEL
Cholesterol, Total: 147 mg/dL (ref 100–199)
HDL: 47 mg/dL (ref >39)
LDL Chol Calc (NIH): 81 mg/dL (ref 0–99)
Triglycerides: 104 mg/dL (ref 0–149)

## 2022-10-31 LAB — PHOSPHORUS: Phosphorus: 3.4 mg/dL (ref 3.0–4.3)

## 2022-11-08 ENCOUNTER — Ambulatory Visit (INDEPENDENT_AMBULATORY_CARE_PROVIDER_SITE_OTHER): Payer: Medicare Other

## 2022-11-08 VITALS — BP 120/70 | Ht 66.0 in | Wt 143.0 lb

## 2022-11-08 DIAGNOSIS — Z0000 Encounter for general adult medical examination without abnormal findings: Secondary | ICD-10-CM | POA: Diagnosis not present

## 2022-11-08 NOTE — Progress Notes (Signed)
 Because this visit was a virtual/telehealth visit,  certain criteria was not obtained, such a blood pressure, CBG if patient is a diabetic, and timed up and go. Any medications not marked as "taking" was not mentioned during the medication reconciliation part of the visit. Any vitals not documented were not able to be obtained due to this being a telehealth visit. Vitals documented are verbally provided by the patient.   Patient reported all vitals Subjective:   Stacy Webb is a 77 y.o. female who presents for Medicare Annual (Subsequent) preventive examination.  Visit Complete: Virtual  I connected with  Stacy Webb on 11/08/22 by a audio enabled telemedicine application and verified that I am speaking with the correct person using two identifiers.  Patient Location: Home  Provider Location: Home Office  I discussed the limitations of evaluation and management by telemedicine. The patient expressed understanding and agreed to proceed.  Patient Medicare AWV questionnaire was completed by the patient on n/a; I have confirmed that all information answered by patient is correct and no changes since this date.  Review of Systems     Cardiac Risk Factors include: advanced age (>15men, >41 women);diabetes mellitus;dyslipidemia;hypertension     Objective:    Today's Vitals   11/08/22 0931  BP: 120/70  Weight: 143 lb (64.9 kg)  Height: 5\' 6"  (1.676 m)   Body mass index is 23.08 kg/m.     11/08/2022    9:30 AM 09/13/2022   11:27 AM 05/21/2022    2:41 PM 01/09/2022    3:00 PM 11/12/2021    8:20 AM 10/02/2021   10:27 AM 05/07/2021    3:38 PM  Advanced Directives  Does Patient Have a Medical Advance Directive? Yes Yes Yes Yes Yes Yes Yes  Type of Estate agent of [place];Living will Healthcare Power of [employer];Living will Healthcare Power of [place];Living will Healthcare Power of [place];Living will Healthcare Power of [place];Living will Healthcare Power of  [place];Living will   Does patient want to make changes to medical advance directive?  No - Patient declined No - Patient declined  No - Patient declined  No - Patient declined  Copy of Healthcare Power of Attorney in Chart? No - copy requested No - copy requested No - copy requested No - copy requested No - copy requested No - copy requested     Current Medications (verified) Outpatient Encounter Medications as of 11/08/2022  Medication Sig   atorvastatin (LIPITOR) 20 MG tablet TAKE 1 TABLET BY MOUTH DAILY   baclofen (LIORESAL) 10 MG tablet Take 1 tablet (10 mg total) by mouth 3 (three) times daily as needed for muscle spasms.   blood glucose meter kit and supplies Dispense based on patient and insurance preference. Use up to four times daily as directed. (FOR ICD-10 E10.9, E11.9).   calcium-vitamin D (OSCAL WITH D) 500-5 MG-MCG tablet Take 1 tablet by mouth.   diltiazem (CARDIZEM LA) 240 MG 24 hr tablet TAKE 1 TABLET BY MOUTH DAILY   docusate sodium (COLACE) 100 MG capsule Take 100 mg by mouth at bedtime.   glimepiride (AMARYL) 1 MG tablet Take 1 tablet (1 mg total) by mouth daily with breakfast.   glucose blood (ACCU-CHEK GUIDE) test strip Tests once a day   hydrochlorothiazide (HYDRODIURIL) 25 MG tablet TAKE 1 TABLET BY MOUTH DAILY   tamoxifen (NOLVADEX) 10 MG tablet Take 1 tablet (10 mg total) by mouth daily.   No facility-administered encounter medications on file as of 11/08/2022.  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)    Diabetes mellitus without complication (HCC)    Diabetic foot ulcer (HCC) 09/27/2016   Excessive hair growth 07/19/2015   Heart murmur    History of kidney stones    Hyperlipidemia    Hypertension    Neuromuscular disorder (HCC)    diabetic neuropathy   Neuropathy    Postmenopausal 07/19/2015    Past Surgical History:  Procedure Laterality Date   ANTERIOR AND POSTERIOR REPAIR N/A 04/28/2018   Procedure: ANTERIOR (CYSTOCELE) AND POSTERIOR REPAIR (RECTOCELE);  Surgeon: Tilda Burrow, MD;  Location: [facility];  Service: Gynecology;  Laterality: N/A;   APPENDECTOMY     BREAST BIOPSY Left Nov 28, 2021   invasive ca   BREAST LUMPECTOMY Left 11-28-21   invasive ca   CARPAL TUNNEL RELEASE Bilateral 29-Nov-1999   CESAREAN SECTION     COLONOSCOPY N/A 08/28/2012   Procedure: COLONOSCOPY;  Surgeon: Malissa Hippo, MD;  Location: [facility];  Service: Endoscopy;  Laterality: N/A;  915   COLONOSCOPY N/A 12/25/2017   Procedure: COLONOSCOPY;  Surgeon: Malissa Hippo, MD;  Location: [facility];  Service: Endoscopy;  Laterality: N/A;  12:45   REVERSE SHOULDER ARTHROPLASTY Left 07/10/2020   Procedure: REVERSE SHOULDER ARTHROPLASTY;  Surgeon: Oliver Barre, MD;  Location: [facility];  Service: Orthopedics;  Laterality: Left;   Family History  Problem Relation Age of Onset   Pneumonia Son    Suicidality Son    Diabetes Daughter    Colon cancer Paternal Aunt    Social History   Socioeconomic History   Marital status: Widowed    Spouse name: Not on file   Number of children: 3   Years of education: Not on file   Highest education level: Not on file  Occupational History   Occupation: [employer]    Comment: Medical Records  Tobacco Use   Smoking status: Never   Smokeless tobacco: Never  Vaping Use   Vaping status: Never Used  Substance and Sexual Activity   Alcohol use: No   Drug use: No   Sexual activity: Not Currently    Birth control/protection: Post-menopausal  Other Topics Concern   Not on file  Social History Narrative   Husband had ALS and passed away.Twin boys, but one died at 58 months and her other son died in 11-29-2014.      Living daughter- lives with pt.   Social Determinants of Health   Financial Resource Strain: Low Risk  (11/08/2022)   Overall Financial Resource Strain (CARDIA)     Difficulty of Paying Living Expenses: Not hard at all  Food Insecurity: No Food Insecurity (11/08/2022)   Hunger Vital Sign    Worried About Running Out of Food in the Last Year: Never true    Ran Out of Food in the Last Year: Never true  Transportation Needs: No Transportation Needs (11/08/2022)   PRAPARE - Administrator, Civil Service (Medical): No    Lack of Transportation (Non-Medical): No  Physical Activity: Insufficiently Active (11/08/2022)   Exercise Vital Sign    Days of Exercise per Week: 2 days    Minutes of Exercise per Session: 60 min  Stress: No Stress Concern Present (11/08/2022)   [employer] of Occupational Health - Occupational Stress Questionnaire  Feeling of Stress : Only a little  Social Connections: Moderately Integrated (11/08/2022)   Social Connection and Isolation Panel [NHANES]    Frequency of Communication with Friends and Family: More than three times a week    Frequency of Social Gatherings with Friends and Family: More than three times a week    Attends Religious Services: More than 4 times per year    Active Member of [employer] or Organizations: Yes    Attends Banker Meetings: More than 4 times per year    Marital Status: Widowed    Tobacco Counseling Counseling given: Yes   Clinical Intake:  Pre-visit preparation completed: Yes  Pain : No/denies pain     BMI - recorded: 23.08 Nutritional Status: BMI of 19-24  Normal Nutritional Risks: None Diabetes: Yes CBG done?: No (telehealth visit.) Did pt. bring in CBG monitor from home?: No  How often do you need to have someone help you when you read instructions, pamphlets, or other written materials from your doctor or pharmacy?: 1 - Never  Interpreter Needed?: No  Information entered by ::   CMA   Activities of Daily Living    11/08/2022    9:43 AM  In your present state of health, do you have any difficulty performing the following activities:  Hearing? 0   Vision? 0  Difficulty concentrating or making decisions? 0  Walking or climbing stairs? 1  Comment has balance issues  Dressing or bathing? 0  Doing errands, shopping? 0  Preparing Food and eating ? N  Using the Toilet? N  In the past six months, have you accidently leaked urine? Y  Do you have problems with loss of bowel control? Y  Managing your Medications? N  Managing your Finances? N  Housekeeping or managing your Housekeeping? N    Patient Care Team: [name], DO as PCP - General (Family Medicine) [name], MD as Medical Oncologist (Medical Oncology) [name], RN as Oncology Nurse Navigator (Medical Oncology)  Indicate any recent Medical Services you may have received from other than [facility] providers in the past year (date may be approximate).     Assessment:   This is a routine wellness examination for Rashema.  Hearing/Vision screen Hearing Screening - Comments:: Patient denies any hearing difficulties.    Dietary issues and exercise activities discussed:     Goals Addressed             This Visit's Progress    Patient Stated       "Improve my balance because I'm afraid of falling"       Depression Screen    11/08/2022    9:37 AM 10/29/2022    2:07 PM 05/03/2022    9:24 AM 04/10/2022   11:31 AM 02/26/2022    9:54 AM 10/02/2021   10:24 AM 05/28/2021   10:22 AM  PHQ 2/9 Scores  PHQ - 2 Score 1 0 2 0 0 0 0  PHQ- 9 Score 3 2 3         Fall Risk    11/08/2022    9:42 AM 10/29/2022    2:07 PM 05/03/2022    9:24 AM 04/10/2022   11:31 AM 02/26/2022    9:54 AM  Fall Risk   Falls in the past year? 1 1 0 1 0  Number falls in past yr: 1 0 0 1   Injury with Fall? 1 1 0 1   Risk for fall due to : History  of fall(s);Impaired balance/gait  No Fall Risks Impaired balance/gait No Fall Risks  Follow up Education provided;Falls prevention discussed;Falls evaluation completed  Falls evaluation completed Falls evaluation completed;Education  provided Falls evaluation completed    MEDICARE RISK AT HOME:  Medicare Risk at Home - 11/08/22 0941     Any stairs in or around the home? Yes    If so, are there any without handrails? No    Home free of loose throw rugs in walkways, pet beds, electrical cords, etc? No    Adequate lighting in your home to reduce risk of falls? No    Life alert? Yes    Use of a cane, Curt or w/c? No    Grab bars in the bathroom? Yes    Shower chair or bench in shower? Yes    Elevated toilet seat or a handicapped toilet? Yes             TIMED UP AND GO:  Was the test performed?  No    Cognitive Function:        11/08/2022    9:33 AM 10/02/2021   10:30 AM 09/22/2020   10:02 AM  6CIT Screen  What Year? 0 points 0 points 0 points  What month? 0 points 0 points 0 points  What time? 0 points 0 points 0 points  Count back from 20 0 points 0 points 0 points  Months in reverse 0 points 0 points 0 points  Repeat phrase 0 points 0 points 0 points  Total Score 0 points 0 points 0 points    Immunizations Immunization History  Administered Date(s) Administered   Fluad Quad(high Dose 65+) 12/08/2020, 02/26/2022   Moderna SARS-COV2 Booster Vaccination 02/24/2020   Moderna Sars-Covid-2 Vaccination 05/23/2019, 06/21/2019, 10/18/2020   Tdap 11/23/2018   Zoster Recombinant(Shingrix) 05/14/2022, 07/09/2022    TDAP status: Up to date  Flu Vaccine status: Due, Education has been provided regarding the importance of this vaccine. Advised may receive this vaccine at local pharmacy or Health Dept. Aware to provide a copy of the vaccination record if obtained from local pharmacy or Health Dept. Verbalized acceptance and understanding.  Pneumococcal vaccine status: Due, Education has been provided regarding the importance of this vaccine. Advised may receive this vaccine at local pharmacy or Health Dept. Aware to provide a copy of the vaccination record if obtained from local pharmacy or Health Dept.  Verbalized acceptance and understanding.  Covid-19 vaccine status: Declined, Education has been provided regarding the importance of this vaccine but patient still declined. Advised may receive this vaccine at local pharmacy or Health Dept.or vaccine clinic. Aware to provide a copy of the vaccination record if obtained from local pharmacy or Health Dept. Verbalized acceptance and understanding.  Qualifies for Shingles Vaccine? Yes   Zostavax completed Yes   Shingrix Completed?: Yes  Screening Tests Health Maintenance  Topic Date Due   Pneumonia Vaccine 79+ Years old (1 of 2 - PCV) Never done   Medicare Annual Wellness (AWV)  10/03/2022   INFLUENZA VACCINE  11/07/2022   COVID-19 Vaccine (4 - 2023-24 season) 11/14/2022 (Originally 12/07/2021)   OPHTHALMOLOGY EXAM  04/04/2023   HEMOGLOBIN A1C  05/02/2023   FOOT EXAM  10/29/2023   Diabetic kidney evaluation - eGFR measurement  10/30/2023   Diabetic kidney evaluation - Urine ACR  10/30/2023   DTaP/Tdap/Td (2 - Td or Tdap) 11/22/2028   DEXA SCAN  Completed   Hepatitis C Screening  Completed   Zoster Vaccines- Shingrix  Completed  HPV VACCINES  Aged Out   Colonoscopy  Discontinued    Health Maintenance  Health Maintenance Due  Topic Date Due   Pneumonia Vaccine 77+ Years old (1 of 2 - PCV) Never done   Medicare Annual Wellness (AWV)  10/03/2022   INFLUENZA VACCINE  11/07/2022    Colorectal cancer screening: Type of screening: Colonoscopy. Completed 12/25/2017. Repeat every 10 years  Mammogram status: Completed 10/22/2022. Repeat every year  Bone Density status: Completed 11/15/2021. Results reflect: Bone density results: NORMAL. Repeat every 5 years.  Lung Cancer Screening: (Low Dose CT Chest recommended if Age 82-80 years, 20 pack-year currently smoking OR have quit w/in 15years.) does not qualify.   Additional Screening:  Hepatitis C Screening: does not qualify; Completed 12/06/2020  Vision Screening: Recommended annual  ophthalmology exams for early detection of glaucoma and other disorders of the eye. Is the patient up to date with their annual eye exam?  Yes  Who is the provider or what is the name of the office in which the patient attends annual eye exams? Dr. Charise Killian [facility] If pt is not established with a provider, would they like to be referred to a provider to establish care? No .   Dental Screening: Recommended annual dental exams for proper oral hygiene  Diabetic Foot Exam: Diabetic Foot Exam: Completed 10/29/2022  Community Resource Referral / Chronic Care Management: CRR required this visit?  No   CCM required this visit?  No     Plan:     I have personally reviewed and noted the following in the patient's chart:   Medical and social history Use of alcohol, tobacco or illicit drugs  Current medications and supplements including opioid prescriptions. Patient is not currently taking opioid prescriptions. Functional ability and status Nutritional status Physical activity Advanced directives List of other physicians Hospitalizations, surgeries, and ER visits in previous 12 months Vitals Screenings to include cognitive, depression, and falls Referrals and appointments  In addition, I have reviewed and discussed with patient certain preventive protocols, quality metrics, and best practice recommendations. A written personalized care plan for preventive services as well as general preventive health recommendations were provided to patient.     Jordan Hawks , CMA   11/08/2022   After Visit Summary: (Mail) Due to this being a telephonic visit, the after visit summary with patients personalized plan was offered to patient via mail   Nurse Notes: Patient complains of RT leg pain after a fall. She has an appt on 11/12/22. Advised patient that if pain worsens or she starts to experience any chest pain or dizziness or just doesn't feel right she needs to go to the nearest ED to be seen.  Patient verbalized understanding.

## 2022-11-08 NOTE — Patient Instructions (Signed)
Stacy Webb , Thank you for taking time to come for your Medicare Wellness Visit. I appreciate your ongoing commitment to your health goals. Please review the following plan we discussed and let me know if I can assist you in the future.   These are the goals we discussed:  Goals      Exercise 3x per week (30 min per time)     Continue to stay active and healthy.     Patient Stated     "Improve my balance because I'm afraid of falling"        This is a list of the screening recommended for you and due dates:  Health Maintenance  Topic Date Due   Pneumonia Vaccine (1 of 2 - PCV) Never done   Flu Shot  11/07/2022   COVID-19 Vaccine (4 - 2023-24 season) 11/14/2022*   Eye exam for diabetics  04/04/2023   Hemoglobin A1C  05/02/2023   Complete foot exam   10/29/2023   Yearly kidney function blood test for diabetes  10/30/2023   Yearly kidney health urinalysis for diabetes  10/30/2023   Medicare Annual Wellness Visit  11/08/2023   DTaP/Tdap/Td vaccine (2 - Td or Tdap) 11/22/2028   DEXA scan (bone density measurement)  Completed   Hepatitis C Screening  Completed   Zoster (Shingles) Vaccine  Completed   HPV Vaccine  Aged Out   Colon Cancer Screening  Discontinued  *Topic was postponed. The date shown is not the original due date.    Advanced directives: Advance directive discussed with you today. Even though you declined this today, please call our office should you change your mind, and we can give you the proper paperwork for you to fill out. Advance care planning is a way to make decisions about medical care that fits your values in case you are ever unable to make these decisions for yourself.  Information on Advanced Care Planning can be found at [facility] Advance Health Care Directives Advance Health Care Directives ([email or web address].[email or web address]/)    Conditions/risks identified:  Understanding Your Risk for Falls Millions of people have serious injuries from falls each  year. It is important to understand your risk of falling. Talk with your health care provider about your risk and what you can do to lower it. If you do have a serious fall, make sure to tell your provider. Falling once raises your risk of falling again. How can falls affect me? Serious injuries from falls are common. These include: Broken bones, such as hip fractures. Head injuries, such as traumatic brain injuries (TBI) or concussions. A fear of falling can cause you to avoid activities and stay at home. This can make your muscles weaker and raise your risk for a fall. What can increase my risk? There are a number of risk factors that increase your risk for falling. The more risk factors you have, the higher your risk of falling. Serious injuries from a fall happen most often to people who are older than 76 years old. Teenagers and young adults ages 73-29 are also at higher risk. Common risk factors include: Weakness in the lower body. Being generally weak or confused due to long-term (chronic) illness. Dizziness or balance problems. Poor vision. Medicines that cause dizziness or drowsiness. These may include: Medicines for your blood pressure, heart, anxiety, insomnia, or swelling (edema). Pain medicines. Muscle relaxants. Other risk factors include: Drinking alcohol. Having had a fall in the past. Having foot pain or wearing improper  footwear. Working at a dangerous job. Having any of the following in your home: Tripping hazards, such as floor clutter or loose rugs. Poor lighting. Pets. Having dementia or memory loss. What actions can I take to lower my risk of falling?     Physical activity Stay physically fit. Do strength and balance exercises. Consider taking a regular class to build strength and balance. Yoga and tai chi are good options. Vision Have your eyes checked every year and your prescription for glasses or contacts updated as needed. Shoes and walking aids Wear  non-skid shoes. Wear shoes that have rubber soles and low heels. Do not wear high heels. Do not walk around the house in socks or slippers. Use a cane or [place] as told by your provider. Home safety Attach secure railings on both sides of your stairs. Install grab bars for your bathtub, shower, and toilet. Use a non-skid mat in your bathtub or shower. Attach bath mats securely with double-sided, non-slip rug tape. Use good lighting in all rooms. Keep a flashlight near your bed. Make sure there is a clear path from your bed to the bathroom. Use night-lights. Do not use throw rugs. Make sure all carpeting is taped or tacked down securely. Remove all clutter from walkways and stairways, including extension cords. Repair uneven or broken steps and floors. Avoid walking on icy or slippery surfaces. Walk on the grass instead of on icy or slick sidewalks. Use ice melter to get rid of ice on walkways in the winter. Use a cordless phone. Questions to ask your health care provider Can you help me check my risk for a fall? Do any of my medicines make me more likely to fall? Should I take a vitamin D supplement? What exercises can I do to improve my strength and balance? Should I make an appointment to have my vision checked? Do I need a bone density test to check for weak bones (osteoporosis)? Would it help to use a cane or a Draeger? Where to find more information Centers for Disease Control and Prevention, STEADI: TonerPromos.no Community-Based Fall Prevention Programs: TonerPromos.no [employer] on Aging: BaseRingTones.pl Contact a health care provider if: You fall at home. You are afraid of falling at home. You feel weak, drowsy, or dizzy. This information is not intended to replace advice given to you by your health care provider. Make sure you discuss any questions you have with your health care provider. Document Revised: 11/26/2021 Document Reviewed: 11/26/2021 Elsevier Patient Education  2024  Elsevier Inc.  You are due for the vaccines checked below. You may have these done at your preferred pharmacy. Please have them fax the office proof of the vaccines so that we can update your chart.   [x]  Flu (due annually) []  Shingrix (Shingles vaccine) [x]  Pneumonia Vaccines []  TDAP (Tetanus) Vaccine every 10 years []  Covid-19   Next appointment: VIRTUAL/TELEPHONE APPOINTMENT Follow up in one year for your annual wellness visit November 14, 2023 at 8:30am telephone visit   Preventive Care 65 Years and Older, Female Preventive care refers to lifestyle choices and visits with your health care provider that can promote health and wellness. What does preventive care include? A yearly physical exam. This is also called an annual well check. Dental exams once or twice a year. Routine eye exams. Ask your health care provider how often you should have your eyes checked. Personal lifestyle choices, including: Daily care of your teeth and gums. Regular physical activity. Eating a healthy diet. Avoiding tobacco  and drug use. Limiting alcohol use. Practicing safe sex. Taking low-dose aspirin every day. Taking vitamin and mineral supplements as recommended by your health care provider. What happens during an annual well check? The services and screenings done by your health care provider during your annual well check will depend on your age, overall health, lifestyle risk factors, and family history of disease. Counseling  Your health care provider may ask you questions about your: Alcohol use. Tobacco use. Drug use. Emotional well-being. Home and relationship well-being. Sexual activity. Eating habits. History of falls. Memory and ability to understand (cognition). Work and work Astronomer. Reproductive health. Screening  You may have the following tests or measurements: Height, weight, and BMI. Blood pressure. Lipid and cholesterol levels. These may be checked every 5 years, or  more frequently if you are over 23 years old. Skin check. Lung cancer screening. You may have this screening every year starting at age 15 if you have a 30-pack-year history of smoking and currently smoke or have quit within the past 15 years. Fecal occult blood test (FOBT) of the stool. You may have this test every year starting at age 10. Flexible sigmoidoscopy or colonoscopy. You may have a sigmoidoscopy every 5 years or a colonoscopy every 10 years starting at age 32. Hepatitis C blood test. Hepatitis B blood test. Sexually transmitted disease (STD) testing. Diabetes screening. This is done by checking your blood sugar (glucose) after you have not eaten for a while (fasting). You may have this done every 1-3 years. Bone density scan. This is done to screen for osteoporosis. You may have this done starting at age 82. Mammogram. This may be done every 1-2 years. Talk to your health care provider about how often you should have regular mammograms. Talk with your health care provider about your test results, treatment options, and if necessary, the need for more tests. Vaccines  Your health care provider may recommend certain vaccines, such as: Influenza vaccine. This is recommended every year. Tetanus, diphtheria, and acellular pertussis (Tdap, Td) vaccine. You may need a Td booster every 10 years. Zoster vaccine. You may need this after age 62. Pneumococcal 13-valent conjugate (PCV13) vaccine. One dose is recommended after age 75. Pneumococcal polysaccharide (PPSV23) vaccine. One dose is recommended after age 38. Talk to your health care provider about which screenings and vaccines you need and how often you need them. This information is not intended to replace advice given to you by your health care provider. Make sure you discuss any questions you have with your health care provider. Document Released: 04/21/2015 Document Revised: 12/13/2015 Document Reviewed: 01/24/2015 Elsevier  Interactive Patient Education  2017 [employer].  Fall Prevention in the Home Falls can cause injuries. They can happen to people of all ages. There are many things you can do to make your home safe and to help prevent falls. What can I do on the outside of my home? Regularly fix the edges of walkways and driveways and fix any cracks. Remove anything that might make you trip as you walk through a door, such as a raised step or threshold. Trim any bushes or trees on the path to your home. Use bright outdoor lighting. Clear any walking paths of anything that might make someone trip, such as rocks or tools. Regularly check to see if handrails are loose or broken. Make sure that both sides of any steps have handrails. Any raised decks and porches should have guardrails on the edges. Have any leaves, snow, or  ice cleared regularly. Use sand or salt on walking paths during winter. Clean up any spills in your garage right away. This includes oil or grease spills. What can I do in the bathroom? Use night lights. Install grab bars by the toilet and in the tub and shower. Do not use towel bars as grab bars. Use non-skid mats or decals in the tub or shower. If you need to sit down in the shower, use a plastic, non-slip stool. Keep the floor dry. Clean up any water that spills on the floor as soon as it happens. Remove soap buildup in the tub or shower regularly. Attach bath mats securely with double-sided non-slip rug tape. Do not have throw rugs and other things on the floor that can make you trip. What can I do in the bedroom? Use night lights. Make sure that you have a light by your bed that is easy to reach. Do not use any sheets or blankets that are too big for your bed. They should not hang down onto the floor. Have a firm chair that has side arms. You can use this for support while you get dressed. Do not have throw rugs and other things on the floor that can make you trip. What can I do  in the kitchen? Clean up any spills right away. Avoid walking on wet floors. Keep items that you use a lot in easy-to-reach places. If you need to reach something above you, use a strong step stool that has a grab bar. Keep electrical cords out of the way. Do not use floor polish or wax that makes floors slippery. If you must use wax, use non-skid floor wax. Do not have throw rugs and other things on the floor that can make you trip. What can I do with my stairs? Do not leave any items on the stairs. Make sure that there are handrails on both sides of the stairs and use them. Fix handrails that are broken or loose. Make sure that handrails are as long as the stairways. Check any carpeting to make sure that it is firmly attached to the stairs. Fix any carpet that is loose or worn. Avoid having throw rugs at the top or bottom of the stairs. If you do have throw rugs, attach them to the floor with carpet tape. Make sure that you have a light switch at the top of the stairs and the bottom of the stairs. If you do not have them, ask someone to add them for you. What else can I do to help prevent falls? Wear shoes that: Do not have high heels. Have rubber bottoms. Are comfortable and fit you well. Are closed at the toe. Do not wear sandals. If you use a stepladder: Make sure that it is fully opened. Do not climb a closed stepladder. Make sure that both sides of the stepladder are locked into place. Ask someone to hold it for you, if possible. Clearly mark and make sure that you can see: Any grab bars or handrails. First and last steps. Where the edge of each step is. Use tools that help you move around (mobility aids) if they are needed. These include: Canes. Walkers. Scooters. Crutches. Turn on the lights when you go into a dark area. Replace any light bulbs as soon as they burn out. Set up your furniture so you have a clear path. Avoid moving your furniture around. If any of your  floors are uneven, fix them. If there are any  pets around you, be aware of where they are. Review your medicines with your doctor. Some medicines can make you feel dizzy. This can increase your chance of falling. Ask your doctor what other things that you can do to help prevent falls. This information is not intended to replace advice given to you by your health care provider. Make sure you discuss any questions you have with your health care provider. Document Released: 01/19/2009 Document Revised: 08/31/2015 Document Reviewed: 04/29/2014 Elsevier Interactive Patient Education  2017 [employer].

## 2022-11-12 ENCOUNTER — Ambulatory Visit: Payer: Medicare Other | Admitting: Family Medicine

## 2022-11-12 VITALS — BP 147/69 | HR 77 | Temp 97.7°F | Ht 66.0 in | Wt 147.2 lb

## 2022-11-12 DIAGNOSIS — M79604 Pain in right leg: Secondary | ICD-10-CM

## 2022-11-12 NOTE — Progress Notes (Signed)
Subjective:  Patient ID: Stacy Webb, female    DOB: 11-27-1945  Age: 77 y.o. MRN: [id number]  CC: Chief Complaint  Patient presents with   Leg Pain    Pt come sin today for a shooting pain down the backside of her right leg. Pt states that the pain goes from the hip area down to the back of the knee. Pain started around the Wednesday after her fall  Larey Seat on:07/22 Leg pain: 07/24    HPI:  77 year old female presents for evaluation of the above.  Patient reports a recent mechanical fall.  She fell on 7/22.  She reports that she is having some pain on the backside of her right upper leg.  Intermittent.  Mild.  Patient states that her sister encouraged her to come in for evaluation.  Patient concerned about the possibility of a blood clot.  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)    Breast cancer of upper-outer quadrant of left female breast (HCC) 11/12/2021   Cholelithiasis 10/29/2020   Rotator cuff arthropathy of left shoulder 07/10/2020   CKD (chronic kidney disease) stage 3, GFR 30-59 ml/min (HCC) 09/27/2016   Hyperlipidemia 09/27/2016   Essential hypertension, benign 08/11/2012    Social Hx   Social History   Socioeconomic History   Marital status: Widowed    Spouse name: Not on file   Number of children: 3   Years of education: Not on file   Highest education level: Not on file  Occupational History   Occupation: [employer]    Comment: Medical Records  Tobacco Use   Smoking status: Never   Smokeless tobacco: Never  Vaping Use   Vaping status: Never Used  Substance and Sexual Activity   Alcohol use: No   Drug use: No   Sexual activity: Not Currently    Birth control/protection: Post-menopausal   Other Topics Concern   Not on file  Social History Narrative   Husband had ALS and passed away.Twin boys, but one died at 86 months and her other son died in 11-21-14.      Living daughter- lives with pt.   Social Determinants of Health   Financial Resource Strain: Low Risk  (11/08/2022)   Overall Financial Resource Strain (CARDIA)    Difficulty of Paying Living Expenses: Not hard at all  Food Insecurity: No Food Insecurity (11/08/2022)   Hunger Vital Sign    Worried About Running Out of Food in the Last Year: Never true    Ran Out of Food in the Last Year: Never true  Transportation Needs: No Transportation Needs (11/08/2022)   PRAPARE - Administrator, Civil Service (Medical): No    Lack of Transportation (Non-Medical): No  Physical Activity: Insufficiently Active (11/08/2022)   Exercise Vital Sign    Days of Exercise per Week: 2 days    Minutes of Exercise per Session: 60 min  Stress: No Stress Concern Present (11/08/2022)   [employer] of Occupational Health - Occupational Stress Questionnaire    Feeling of Stress : Only a little  Social Connections: Moderately Integrated (11/08/2022)   Social Connection and Isolation Panel [NHANES]    Frequency of Communication with Friends and Family: More than three times a week  Frequency of Social Gatherings with Friends and Family: More than three times a week    Attends Religious Services: More than 4 times per year    Active Member of Clubs or Organizations: Yes    Attends Banker Meetings: More than 4 times per year    Marital Status: Widowed    Review of Systems Per HPI  Objective:  BP (!) 147/69   Pulse 77   Temp 97.7 F (36.5 C)   Ht 5\' 6"  (1.676 m)   Wt 147 lb 3.2 oz (66.8 kg)   SpO2 99%   BMI 23.76 kg/m      11/12/2022   11:11 AM 11/08/2022    9:31 AM 10/30/2022   11:26 AM  BP/Weight  Systolic BP 147 120 161  Diastolic BP 69 70 80  Wt. (Lbs) 147.2 143 143.2  BMI 23.76 kg/m2 23.08 kg/m2 23.11  kg/m2    Physical Exam Vitals and nursing note reviewed.  Constitutional:      General: She is not in acute distress.    Appearance: Normal appearance.  HENT:     Head: Normocephalic and atraumatic.  Cardiovascular:     Rate and Rhythm: Normal rate and regular rhythm.  Pulmonary:     Effort: Pulmonary effort is normal.     Breath sounds: Normal breath sounds.  Musculoskeletal:     Comments: Right lower extremity -no calf tenderness or swelling.  No significant tenderness of the anterior, lateral, or posterior thigh.  Neurological:     Mental Status: She is alert.  Psychiatric:     Comments: Anxious.     Lab Results  Component Value Date   WBC 9.0 08/13/2022   HGB 9.8 (L) 08/13/2022   HCT 29.9 (L) 08/13/2022   PLT 257 08/13/2022   GLUCOSE 116 (H) 10/30/2022   CHOL 147 10/30/2022   TRIG 104 10/30/2022   HDL 47 10/30/2022   LDLCALC 81 10/30/2022   ALT 20 10/30/2022   AST 31 10/30/2022   NA 144 10/30/2022   K 3.9 10/30/2022   CL 105 10/30/2022   CREATININE 1.85 (H) 10/30/2022   BUN 33 (H) 10/30/2022   CO2 24 10/30/2022   HGBA1C 6.6 (H) 10/30/2022     Assessment & Plan:   Problem List Items Addressed This Visit   None Visit Diagnoses     Pain of right lower extremity    -  Primary      Recent fall.  Exam unremarkable.  Informed patient that there is no clinical evidence of clot.  Offered medication.  Patient declined.  Advised to let me know if this continues to persist.  Follow-up:  Return if symptoms worsen or fail to improve.  Everlene Other DO [facility]

## 2022-11-12 NOTE — Patient Instructions (Signed)
No need to be worried.  If persists, please let me know.

## 2022-11-14 ENCOUNTER — Inpatient Hospital Stay: Payer: Medicare Other

## 2022-11-15 ENCOUNTER — Inpatient Hospital Stay: Payer: Medicare Other | Attending: Hematology

## 2022-11-15 DIAGNOSIS — Z9049 Acquired absence of other specified parts of digestive tract: Secondary | ICD-10-CM | POA: Insufficient documentation

## 2022-11-15 DIAGNOSIS — Z79899 Other long term (current) drug therapy: Secondary | ICD-10-CM | POA: Diagnosis not present

## 2022-11-15 DIAGNOSIS — Z833 Family history of diabetes mellitus: Secondary | ICD-10-CM | POA: Insufficient documentation

## 2022-11-15 DIAGNOSIS — Z7984 Long term (current) use of oral hypoglycemic drugs: Secondary | ICD-10-CM | POA: Diagnosis not present

## 2022-11-15 DIAGNOSIS — E785 Hyperlipidemia, unspecified: Secondary | ICD-10-CM | POA: Insufficient documentation

## 2022-11-15 DIAGNOSIS — I2510 Atherosclerotic heart disease of native coronary artery without angina pectoris: Secondary | ICD-10-CM | POA: Insufficient documentation

## 2022-11-15 DIAGNOSIS — B9789 Other viral agents as the cause of diseases classified elsewhere: Secondary | ICD-10-CM | POA: Diagnosis not present

## 2022-11-15 DIAGNOSIS — Z800 Family history of malignant neoplasm of digestive organs: Secondary | ICD-10-CM | POA: Diagnosis not present

## 2022-11-15 DIAGNOSIS — Z87442 Personal history of urinary calculi: Secondary | ICD-10-CM | POA: Insufficient documentation

## 2022-11-15 DIAGNOSIS — Z801 Family history of malignant neoplasm of trachea, bronchus and lung: Secondary | ICD-10-CM | POA: Diagnosis not present

## 2022-11-15 DIAGNOSIS — D631 Anemia in chronic kidney disease: Secondary | ICD-10-CM | POA: Diagnosis not present

## 2022-11-15 DIAGNOSIS — C50412 Malignant neoplasm of upper-outer quadrant of left female breast: Secondary | ICD-10-CM | POA: Insufficient documentation

## 2022-11-15 DIAGNOSIS — E1122 Type 2 diabetes mellitus with diabetic chronic kidney disease: Secondary | ICD-10-CM | POA: Diagnosis not present

## 2022-11-15 DIAGNOSIS — E611 Iron deficiency: Secondary | ICD-10-CM | POA: Diagnosis not present

## 2022-11-15 DIAGNOSIS — R059 Cough, unspecified: Secondary | ICD-10-CM | POA: Insufficient documentation

## 2022-11-15 DIAGNOSIS — Z79810 Long term (current) use of selective estrogen receptor modulators (SERMs): Secondary | ICD-10-CM | POA: Diagnosis not present

## 2022-11-15 DIAGNOSIS — Z170 Estrogen receptor positive status [ER+]: Secondary | ICD-10-CM | POA: Diagnosis not present

## 2022-11-15 DIAGNOSIS — N6325 Unspecified lump in the left breast, overlapping quadrants: Secondary | ICD-10-CM | POA: Insufficient documentation

## 2022-11-15 DIAGNOSIS — N189 Chronic kidney disease, unspecified: Secondary | ICD-10-CM | POA: Insufficient documentation

## 2022-11-15 DIAGNOSIS — Z881 Allergy status to other antibiotic agents status: Secondary | ICD-10-CM | POA: Insufficient documentation

## 2022-11-15 DIAGNOSIS — I129 Hypertensive chronic kidney disease with stage 1 through stage 4 chronic kidney disease, or unspecified chronic kidney disease: Secondary | ICD-10-CM | POA: Insufficient documentation

## 2022-11-15 LAB — COMPREHENSIVE METABOLIC PANEL
ALT: 17 U/L (ref 0–44)
AST: 23 U/L (ref 15–41)
Albumin: 3.6 g/dL (ref 3.5–5.0)
Alkaline Phosphatase: 46 U/L (ref 38–126)
Anion gap: 10 (ref 5–15)
BUN: 34 mg/dL — ABNORMAL HIGH (ref 8–23)
CO2: 22 mmol/L (ref 22–32)
Calcium: 8.9 mg/dL (ref 8.9–10.3)
Chloride: 104 mmol/L (ref 98–111)
Creatinine, Ser: 1.6 mg/dL — ABNORMAL HIGH (ref 0.44–1.00)
GFR, Estimated: 33 mL/min — ABNORMAL LOW (ref >60)
Glucose, Bld: 279 mg/dL — ABNORMAL HIGH (ref 70–99)
Potassium: 3.8 mmol/L (ref 3.5–5.1)
Sodium: 136 mmol/L (ref 135–145)
Total Bilirubin: 0.5 mg/dL (ref 0.3–1.2)
Total Protein: 6.6 g/dL (ref 6.5–8.1)

## 2022-11-15 LAB — CBC WITH DIFFERENTIAL/PLATELET
Abs Immature Granulocytes: 0.05 10*3/uL (ref 0.00–0.07)
Basophils Absolute: 0.1 10*3/uL (ref 0.0–0.1)
Basophils Relative: 1 %
Eosinophils Absolute: 0.2 10*3/uL (ref 0.0–0.5)
Eosinophils Relative: 2 %
HCT: 35.5 % — ABNORMAL LOW (ref 36.0–46.0)
Hemoglobin: 11.6 g/dL — ABNORMAL LOW (ref 12.0–15.0)
Immature Granulocytes: 1 %
Lymphocytes Relative: 26 %
Lymphs Abs: 2.6 10*3/uL (ref 0.7–4.0)
MCH: 28.8 pg (ref 26.0–34.0)
MCHC: 32.7 g/dL (ref 30.0–36.0)
MCV: 88.1 fL (ref 80.0–100.0)
Monocytes Absolute: 1.2 10*3/uL — ABNORMAL HIGH (ref 0.1–1.0)
Monocytes Relative: 11 %
Neutro Abs: 6 10*3/uL (ref 1.7–7.7)
Neutrophils Relative %: 59 %
Platelets: 231 10*3/uL (ref 150–400)
RBC: 4.03 MIL/uL (ref 3.87–5.11)
RDW: 15.9 % — ABNORMAL HIGH (ref 11.5–15.5)
WBC: 10.1 10*3/uL (ref 4.0–10.5)
nRBC: 0 % (ref 0.0–0.2)

## 2022-11-15 LAB — IRON AND TIBC
Iron: 69 ug/dL (ref 28–170)
Saturation Ratios: 20 % (ref 10.4–31.8)
TIBC: 353 ug/dL (ref 250–450)
UIBC: 284 ug/dL

## 2022-11-15 LAB — FERRITIN: Ferritin: 568 ng/mL — ABNORMAL HIGH (ref 11–307)

## 2022-11-20 ENCOUNTER — Inpatient Hospital Stay (HOSPITAL_BASED_OUTPATIENT_CLINIC_OR_DEPARTMENT_OTHER): Payer: Medicare Other | Admitting: Hematology

## 2022-11-20 VITALS — BP 129/60 | HR 76 | Temp 98.0°F | Resp 16 | Wt 149.2 lb

## 2022-11-20 DIAGNOSIS — Z800 Family history of malignant neoplasm of digestive organs: Secondary | ICD-10-CM | POA: Diagnosis not present

## 2022-11-20 DIAGNOSIS — Z833 Family history of diabetes mellitus: Secondary | ICD-10-CM | POA: Diagnosis not present

## 2022-11-20 DIAGNOSIS — I2510 Atherosclerotic heart disease of native coronary artery without angina pectoris: Secondary | ICD-10-CM | POA: Diagnosis not present

## 2022-11-20 DIAGNOSIS — Z170 Estrogen receptor positive status [ER+]: Secondary | ICD-10-CM

## 2022-11-20 DIAGNOSIS — R059 Cough, unspecified: Secondary | ICD-10-CM | POA: Diagnosis not present

## 2022-11-20 DIAGNOSIS — E611 Iron deficiency: Secondary | ICD-10-CM | POA: Diagnosis not present

## 2022-11-20 DIAGNOSIS — Z881 Allergy status to other antibiotic agents status: Secondary | ICD-10-CM | POA: Diagnosis not present

## 2022-11-20 DIAGNOSIS — Z79899 Other long term (current) drug therapy: Secondary | ICD-10-CM | POA: Diagnosis not present

## 2022-11-20 DIAGNOSIS — Z9049 Acquired absence of other specified parts of digestive tract: Secondary | ICD-10-CM | POA: Diagnosis not present

## 2022-11-20 DIAGNOSIS — Z801 Family history of malignant neoplasm of trachea, bronchus and lung: Secondary | ICD-10-CM | POA: Diagnosis not present

## 2022-11-20 DIAGNOSIS — N6325 Unspecified lump in the left breast, overlapping quadrants: Secondary | ICD-10-CM | POA: Diagnosis not present

## 2022-11-20 DIAGNOSIS — E1122 Type 2 diabetes mellitus with diabetic chronic kidney disease: Secondary | ICD-10-CM | POA: Diagnosis not present

## 2022-11-20 DIAGNOSIS — Z7984 Long term (current) use of oral hypoglycemic drugs: Secondary | ICD-10-CM | POA: Diagnosis not present

## 2022-11-20 DIAGNOSIS — I129 Hypertensive chronic kidney disease with stage 1 through stage 4 chronic kidney disease, or unspecified chronic kidney disease: Secondary | ICD-10-CM | POA: Diagnosis not present

## 2022-11-20 DIAGNOSIS — E785 Hyperlipidemia, unspecified: Secondary | ICD-10-CM | POA: Diagnosis not present

## 2022-11-20 DIAGNOSIS — D509 Iron deficiency anemia, unspecified: Secondary | ICD-10-CM | POA: Diagnosis not present

## 2022-11-20 DIAGNOSIS — N189 Chronic kidney disease, unspecified: Secondary | ICD-10-CM | POA: Diagnosis not present

## 2022-11-20 DIAGNOSIS — B9789 Other viral agents as the cause of diseases classified elsewhere: Secondary | ICD-10-CM | POA: Diagnosis not present

## 2022-11-20 DIAGNOSIS — D631 Anemia in chronic kidney disease: Secondary | ICD-10-CM | POA: Diagnosis not present

## 2022-11-20 DIAGNOSIS — C50412 Malignant neoplasm of upper-outer quadrant of left female breast: Secondary | ICD-10-CM | POA: Diagnosis not present

## 2022-11-20 DIAGNOSIS — Z87442 Personal history of urinary calculi: Secondary | ICD-10-CM | POA: Diagnosis not present

## 2022-11-20 NOTE — Patient Instructions (Signed)
[facility]  Discharge Instructions  You were seen and examined today by Dr. Ellin Saba.  Dr. Ellin Saba discussed your most recent lab work which revealed that everything looks good except your sugar is elevated.  Continue taking the Tamoxifen, Vitamin D and Calcium as prescribed.  Follow-up as scheduled in 6 months.    Thank you for choosing [facility] to provide your oncology and hematology care.   To afford each patient quality time with our provider, please arrive at least 15 minutes before your scheduled appointment time. You may need to reschedule your appointment if you arrive late (10 or more minutes). Arriving late affects you and other patients whose appointments are after yours.  Also, if you miss three or more appointments without notifying the office, you may be dismissed from the clinic at the provider's discretion.    Again, thank you for choosing [facility].  Our hope is that these requests will decrease the amount of time that you wait before being seen by our physicians.   If you have a lab appointment with the Cancer Center - please note that after April 8th, all labs will be drawn in the cancer center.  You do not have to check in or register with the main entrance as you have in the past but will complete your check-in at the cancer center.            _____________________________________________________________  Should you have questions after your visit to [facility], please contact our office at [phone] and follow the prompts.  Our office hours are 8:00 a.m. to 4:30 p.m. Monday - Thursday and 8:00 a.m. to 2:30 p.m. Friday.  Please note that voicemails left after 4:00 p.m. may not be returned until the following business day.  We are closed weekends and all major holidays.  You do have access to a nurse 24-7, just call the main number to the clinic [phone] and do not press  any options, hold on the line and a nurse will answer the phone.    For prescription refill requests, have your pharmacy contact our office and allow 72 hours.    Masks are no longer required in the cancer centers. If you would like for your care team to wear a mask while they are taking care of you, please let them know. You may have one support person who is at least 76 years old accompany you for your appointments.

## 2022-11-20 NOTE — Progress Notes (Signed)
[facility] [address] [place] [address]    Clinic Day:  11/20/2022  Referring physician: [name], DO  Patient Care Team: [name], DO as PCP - General (Family Medicine) [name], MD as Medical Oncologist (Medical Oncology) [name], RN as Oncology Nurse Navigator (Medical Oncology) [name], DO (Optometry)   ASSESSMENT & PLAN:   Assessment: 1.  Stage I (T1AN0G1) left breast IDC, ER/PR positive, HER2 negative: - Screening mammogram (10/17/2021): Abnormal. - Diagnostic mammogram/ultrasound (10/23/2021): Irregular hypoechoic mass in the left breast at 3:00 measuring 0.5 x 0.5 x 0.4 cm.  No lymphadenopathy in the left axilla. - Biopsy (11/06/2021): Invasive ductal carcinoma, grade 1, ER 90% strong staining, PR 40%, Ki-67 1%, HER2 1+ - Left lumpectomy on 12/03/2021 - Pathology: Invasive carcinoma, ductal type, grade 1, 4 mm in greatest dimension, margins negative.  ER 90%, PR 40%, HER2 1+, Ki-67 1%.  PT1AP NX. - Anastrozole was prescribed on 01/09/2022.  She was concerned about dizziness as a side effect and did not start it. - She met with radiation oncology who did not recommend radiation. - Tamoxifen 10 mg daily started on 08/20/2022.  2.  Social/family history: - She lives by herself at home.  Daughter is temporarily living with her.  She is independent of ADLs and IADLs.  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  Never smoker. - Father had lung cancer and was smoker.  Paternal aunt had cancer of the female organs.  Brother had throat cancer and was smoker.     Plan: 1.  Stage I (PT1ANx G1) left breast IDC, ER/PR positive, HER2 negative: - She started taking tamoxifen which was prescribed at last visit on 08/20/2022. - She did not report any major side effects. - Reviewed labs today: Normal LFTs.  Creatinine 1.6 and stable.  CBC grossly normal. - Mammogram from 10/22/2022: BI-RADS Category 2. -  Continue tamoxifen daily.  RTC 6 months for follow-up.   2.  Bone health (DEXA 11/15/2021 T-score -0.3): - Continue calcium and vitamin D supplements.  Will check vitamin D at next visit.   3.  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 - Received Feraheme on 09/06/2022 and 09/12/2022. - She reported improvement in energy levels. - Hemoglobin improved 11.6 from 9.6.  Ferritin is 568. - Will repeat ferritin and iron panel in 6 months.    Orders Placed This Encounter  Procedures   CBC with Differential/Platelet    Standing Status:   Future    Standing Expiration Date:   11/20/2023    Order Specific Question:   Release to patient    Answer:   Immediate   Comprehensive metabolic panel    Standing Status:   Future    Standing Expiration Date:   11/20/2023    Order Specific Question:   Release to patient    Answer:   Immediate   VITAMIN D 25 Hydroxy (Vit-D Deficiency, Fractures)    Standing Status:   Future    Standing Expiration Date:   11/20/2023    Order Specific Question:   Release to patient    Answer:   Immediate   Ferritin    Standing Status:   Future    Standing Expiration Date:   11/20/2023    Order Specific Question:   Release to patient    Answer:   Immediate   Iron and TIBC    Standing Status:  Future    Standing Expiration Date:   11/20/2023    Order Specific Question:   Release to patient    Answer:   Immediate      I,[name],acting as a scribe for [name], MD.,have documented all relevant documentation on the behalf of [name], MD,as directed by  [name], MD while in the presence of [name], MD.  I, [name] MD, have reviewed the above documentation for accuracy and completeness, and I agree with the above.    [name], MD   8/14/20245:59 PM  CHIEF COMPLAINT:   Diagnosis: left breast cancer    Cancer Staging  Breast cancer of upper-outer quadrant of left female  breast [facility]) Staging form: Breast, AJCC 8th Edition - Clinical stage from 11/12/2021: Stage IA (cT1a, cN0, cM0, G1, ER+, PR+, HER2-) - Unsigned    Prior Therapy: Left lumpectomy on 12/03/2021   Current Therapy: Tamoxifen 10 mg daily   HISTORY OF PRESENT ILLNESS:   Oncology History   No history exists.     INTERVAL HISTORY:   Stacy Webb is a 77 y.o. female presenting to clinic today for follow up of left breast cancer. She was last seen by me on 08/20/22.  Since her last visit, she underwent a bilateral MM on 10/22/22 that found: new lumpectomy changes in the left breast and no mammographic evidence of malignancy in either breast.  She presented to the ED on 09/12/22 for a viral URI with cough, negative for COVID-19.   Today, she states that she is doing well overall. Her appetite level is at 100%. Her energy level is at 75%.  She is tolerating Tamoxifen well and denies any hot flashes or other side effects from treatment. She is taking Calcium and Vitamin D as prescribed. She recently had IV Feraheme on 09/06/22 and 09/12/22 and notes improved energy levels. She denies any issues from receiving IV iron. She has not recently checked her blood glucose at home.   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)    Diabetes mellitus without complication (HCC)    Diabetic foot ulcer (HCC) 09/27/2016   Excessive hair growth 07/19/2015   Heart murmur    History of kidney stones    Hyperlipidemia    Hypertension    Neuromuscular disorder (HCC)    diabetic neuropathy   Neuropathy    Postmenopausal 07/19/2015    Surgical History: Past Surgical History:  Procedure Laterality Date   ANTERIOR AND POSTERIOR REPAIR N/A 04/28/2018   Procedure: ANTERIOR (CYSTOCELE) AND POSTERIOR REPAIR (RECTOCELE);  Surgeon: Tilda Burrow, MD;  Location: [facility];  Service: Gynecology;   Laterality: N/A;   APPENDECTOMY     BREAST BIOPSY Left 2023   invasive ca   BREAST LUMPECTOMY Left 2023   invasive ca   CARPAL TUNNEL RELEASE Bilateral 2001   CESAREAN SECTION     COLONOSCOPY N/A 08/28/2012   Procedure: COLONOSCOPY;  Surgeon: Malissa Hippo, MD;  Location: [facility];  Service: Endoscopy;  Laterality: N/A;  915   COLONOSCOPY N/A 12/25/2017   Procedure: COLONOSCOPY;  Surgeon: Malissa Hippo, MD;  Location: [facility];  Service: Endoscopy;  Laterality: N/A;  12:45   REVERSE SHOULDER ARTHROPLASTY Left 07/10/2020   Procedure: REVERSE SHOULDER ARTHROPLASTY;  Surgeon: Oliver Barre, MD;  Location:  [facility];  Service: Orthopedics;  Laterality: Left;    Social History: Social History   Socioeconomic History   Marital status: Widowed    Spouse name: Not on file   Number of children: 3   Years of education: Not on file   Highest education level: Not on file  Occupational History   Occupation: [employer]    Comment: Medical Records  Tobacco Use   Smoking status: Never   Smokeless tobacco: Never  Vaping Use   Vaping status: Never Used  Substance and Sexual Activity   Alcohol use: No   Drug use: No   Sexual activity: Not Currently    Birth control/protection: Post-menopausal  Other Topics Concern   Not on file  Social History Narrative   Husband had ALS and passed away.Twin boys, but one died at 43 months and her other son died in 12-Dec-2014.      Living daughter- lives with pt.   Social Determinants of Health   Financial Resource Strain: Low Risk  (11/08/2022)   Overall Financial Resource Strain (CARDIA)    Difficulty of Paying Living Expenses: Not hard at all  Food Insecurity: No Food Insecurity (11/08/2022)   Hunger Vital Sign    Worried About Running Out of Food in the Last Year: Never true    Ran Out of Food in the Last Year: Never true  Transportation Needs: No Transportation Needs (11/08/2022)   PRAPARE - Administrator, Civil Service (Medical): No     Lack of Transportation (Non-Medical): No  Physical Activity: Insufficiently Active (11/08/2022)   Exercise Vital Sign    Days of Exercise per Week: 2 days    Minutes of Exercise per Session: 60 min  Stress: No Stress Concern Present (11/08/2022)   [employer] of Occupational Health - Occupational Stress Questionnaire    Feeling of Stress : Only a little  Social Connections: Moderately Integrated (11/08/2022)   Social Connection and Isolation Panel [NHANES]    Frequency of Communication with Friends and Family: More than three times a week    Frequency of Social Gatherings with Friends and Family: More than three times a week    Attends Religious Services: More than 4 times per year    Active Member of [employer] or Organizations: Yes    Attends Banker Meetings: More than 4 times per year    Marital Status: Widowed  Intimate Partner Violence: Not At Risk (11/08/2022)   Humiliation, Afraid, Rape, and Kick questionnaire    Fear of Current or Ex-Partner: No    Emotionally Abused: No    Physically Abused: No    Sexually Abused: No    Family History: Family History  Problem Relation Age of Onset   Pneumonia Son    Suicidality Son    Diabetes Daughter    Colon cancer Paternal Aunt     Current Medications:  Current Outpatient Medications:    atorvastatin (LIPITOR) 20 MG tablet, TAKE 1 TABLET BY MOUTH DAILY, Disp: 90 tablet, Rfl: 0   blood glucose meter kit and supplies, Dispense based on patient and insurance preference. Use up to four times daily as directed. (FOR ICD-10 E10.9, E11.9)., Disp: 1 each, Rfl: 0   calcium-vitamin D (OSCAL WITH D) 500-5 MG-MCG tablet, Take 1 tablet by mouth., Disp: , Rfl:    diltiazem (CARDIZEM LA) 240 MG 24 hr tablet, TAKE 1 TABLET BY MOUTH DAILY, Disp: 100 tablet, Rfl: 0   docusate sodium (COLACE) 100 MG capsule, Take 100  mg by mouth at bedtime., Disp: , Rfl:    glimepiride (AMARYL) 1 MG tablet, Take 1 tablet (1 mg total) by mouth daily with  breakfast., Disp: 15 tablet, Rfl: 0   glucose blood (ACCU-CHEK GUIDE) test strip, Tests once a day, Disp: 50 each, Rfl: 5   hydrochlorothiazide (HYDRODIURIL) 25 MG tablet, TAKE 1 TABLET BY MOUTH DAILY, Disp: 100 tablet, Rfl: 2   tamoxifen (NOLVADEX) 10 MG tablet, Take 1 tablet (10 mg total) by mouth daily., Disp: 30 tablet, Rfl: 3   Allergies: Allergies  Allergen Reactions   Clindamycin/Lincomycin Other (See Comments)    Mouth taste like metal   Enalapril Cough    REVIEW OF SYSTEMS:   Review of Systems  Constitutional:  Negative for chills, fatigue and fever.  HENT:   Negative for lump/mass, mouth sores, nosebleeds, sore throat and trouble swallowing.   Eyes:  Negative for eye problems.  Respiratory:  Negative for cough and shortness of breath.   Cardiovascular:  Negative for chest pain, leg swelling and palpitations.  Gastrointestinal:  Positive for constipation. Negative for abdominal pain, diarrhea, nausea and vomiting.  Genitourinary:  Negative for bladder incontinence, difficulty urinating, dysuria, frequency, hematuria and nocturia.   Musculoskeletal:  Negative for arthralgias, back pain, flank pain, myalgias and neck pain.  Skin:  Negative for itching and rash.  Neurological:  Positive for dizziness. Negative for headaches and numbness.  Hematological:  Does not bruise/bleed easily.  Psychiatric/Behavioral:  Negative for depression, sleep disturbance and suicidal ideas. The patient is not nervous/anxious.   All other systems reviewed and are negative.    VITALS:   Blood pressure 129/60, pulse 76, temperature 98 F (36.7 C), temperature source Oral, resp. rate 16, weight 149 lb 3.2 oz (67.7 kg), SpO2 97%.  Wt Readings from Last 3 Encounters:  11/20/22 149 lb 3.2 oz (67.7 kg)  11/12/22 147 lb 3.2 oz (66.8 kg)  11/08/22 143 lb (64.9 kg)    Body mass index is 24.08 kg/m.  Performance status (ECOG): 1 - Symptomatic but completely ambulatory  PHYSICAL EXAM:   Physical  Exam Vitals and nursing note reviewed. Exam conducted with a chaperone present.  Constitutional:      Appearance: Normal appearance.  Cardiovascular:     Rate and Rhythm: Normal rate and regular rhythm.     Pulses: Normal pulses.     Heart sounds: Normal heart sounds.  Pulmonary:     Effort: Pulmonary effort is normal.     Breath sounds: Normal breath sounds.  Abdominal:     Palpations: Abdomen is soft. There is no hepatomegaly, splenomegaly or mass.     Tenderness: There is no abdominal tenderness.  Musculoskeletal:     Right lower leg: No edema.     Left lower leg: No edema.  Lymphadenopathy:     Cervical: No cervical adenopathy.     Right cervical: No superficial, deep or posterior cervical adenopathy.    Left cervical: No superficial, deep or posterior cervical adenopathy.     Upper Body:     Right upper body: No supraclavicular or axillary adenopathy.     Left upper body: No supraclavicular or axillary adenopathy.  Neurological:     General: No focal deficit present.     Mental Status: She is alert and oriented to person, place, and time.  Psychiatric:        Mood and Affect: Mood normal.        Behavior: Behavior normal.   Breast Exam Chaperone: Anne Fu,  LPN   LABS:      Latest Ref Rng & Units 11/15/2022    7:59 AM 08/13/2022    2:50 PM 05/14/2022    2:22 PM  CBC  WBC 4.0 - 10.5 K/uL 10.1  9.0  7.1   Hemoglobin 12.0 - 15.0 g/dL 81.1  9.8  9.6   Hematocrit 36.0 - 46.0 % 35.5  29.9  29.7   Platelets 150 - 400 K/uL 231  257  241       Latest Ref Rng & Units 11/15/2022    7:59 AM 10/30/2022   10:38 AM 08/13/2022    2:50 PM  CMP  Glucose 70 - 99 mg/dL 914  782  956   BUN 8 - 23 mg/dL 34  33  45   Creatinine 0.44 - 1.00 mg/dL 2.13  0.86  5.78   Sodium 135 - 145 mmol/L 136  144  137   Potassium 3.5 - 5.1 mmol/L 3.8  3.9  3.2   Chloride 98 - 111 mmol/L 104  105  102   CO2 22 - 32 mmol/L 22  24  24    Calcium 8.9 - 10.3 mg/dL 8.9  9.2  9.0   Total Protein 6.5 -  8.1 g/dL 6.6  6.8  7.1   Total Bilirubin 0.3 - 1.2 mg/dL 0.5  0.3  0.7   Alkaline Phos 38 - 126 U/L 46  64  69   AST 15 - 41 U/L 23  31  30    ALT 0 - 44 U/L 17  20  24       No results found for: "CEA1", "CEA" / No results found for: "CEA1", "CEA" No results found for: "PSA1" No results found for: "ION629" No results found for: "CAN125"  No results found for: "TOTALPROTELP", "ALBUMINELP", "A1GS", "A2GS", "BETS", "BETA2SER", "GAMS", "MSPIKE", "SPEI" Lab Results  Component Value Date   TIBC 353 11/15/2022   TIBC 391 08/13/2022   TIBC 345 09/15/2017   FERRITIN 568 (H) 11/15/2022   FERRITIN 71 08/13/2022   FERRITIN 295 (H) 10/26/2020   IRONPCTSAT 20 11/15/2022   IRONPCTSAT 8 (L) 08/13/2022   IRONPCTSAT 10 (L) 09/15/2017   No results found for: "LDH"   STUDIES:   MM 3D DIAGNOSTIC MAMMOGRAM BILATERAL BREAST  Result Date: 10/22/2022 CLINICAL DATA:  77 year old female with history of left breast cancer post lumpectomy 12/03/2021. Patient is currently taking tamoxifen. EXAM: DIGITAL DIAGNOSTIC BILATERAL MAMMOGRAM WITH TOMOSYNTHESIS AND CAD TECHNIQUE: Bilateral digital diagnostic mammography and breast tomosynthesis was performed. The images were evaluated with computer-aided detection. COMPARISON:  Previous exam(s). ACR Breast Density Category c: The breasts are heterogeneously dense, which may obscure small masses. FINDINGS: New lumpectomy changes are present in the upper-outer left breast. There are no suspicious masses or calcifications seen in either breast. A spot compression magnification CC view of the left lumpectomy site was performed. There is no mammographic evidence of locally recurrent malignancy. IMPRESSION: New lumpectomy changes in the left breast. No mammographic evidence of malignancy in either breast. RECOMMENDATION: Screening mammogram in one year.(Code:SM-B-01Y) I have discussed the findings and recommendations with the patient. If applicable, a reminder letter will be sent  to the patient regarding the next appointment. BI-RADS CATEGORY  2: Benign. Electronically Signed   By: Edwin Cap M.D.   On: 10/22/2022 10:12

## 2022-11-20 NOTE — Progress Notes (Signed)
Patient is taking tamoxifen as prescribed.  She has not missed any doses and reports no side effects at this time.  ? ?

## 2022-12-01 ENCOUNTER — Other Ambulatory Visit: Payer: Self-pay | Admitting: Family Medicine

## 2023-01-06 DIAGNOSIS — I739 Peripheral vascular disease, unspecified: Secondary | ICD-10-CM | POA: Diagnosis not present

## 2023-01-06 DIAGNOSIS — M79671 Pain in right foot: Secondary | ICD-10-CM | POA: Diagnosis not present

## 2023-01-06 DIAGNOSIS — M79672 Pain in left foot: Secondary | ICD-10-CM | POA: Diagnosis not present

## 2023-01-06 DIAGNOSIS — E1140 Type 2 diabetes mellitus with diabetic neuropathy, unspecified: Secondary | ICD-10-CM | POA: Diagnosis not present

## 2023-01-06 DIAGNOSIS — L110 Acquired keratosis follicularis: Secondary | ICD-10-CM | POA: Diagnosis not present

## 2023-01-07 ENCOUNTER — Ambulatory Visit: Payer: Medicare Other

## 2023-01-13 ENCOUNTER — Ambulatory Visit: Payer: Medicare Other | Attending: Obstetrics & Gynecology

## 2023-01-13 ENCOUNTER — Other Ambulatory Visit: Payer: Self-pay

## 2023-01-13 DIAGNOSIS — N3946 Mixed incontinence: Secondary | ICD-10-CM | POA: Diagnosis present

## 2023-01-13 DIAGNOSIS — R159 Full incontinence of feces: Secondary | ICD-10-CM | POA: Diagnosis present

## 2023-01-13 DIAGNOSIS — R279 Unspecified lack of coordination: Secondary | ICD-10-CM | POA: Insufficient documentation

## 2023-01-13 DIAGNOSIS — M6281 Muscle weakness (generalized): Secondary | ICD-10-CM | POA: Insufficient documentation

## 2023-01-13 DIAGNOSIS — K5900 Constipation, unspecified: Secondary | ICD-10-CM | POA: Insufficient documentation

## 2023-01-13 DIAGNOSIS — R293 Abnormal posture: Secondary | ICD-10-CM | POA: Diagnosis not present

## 2023-01-13 DIAGNOSIS — M14671 Charcot's joint, right ankle and foot: Secondary | ICD-10-CM | POA: Diagnosis not present

## 2023-01-13 NOTE — Therapy (Signed)
OUTPATIENT PHYSICAL THERAPY FEMALE PELVIC EVALUATION   Patient Name: Stacy Webb MRN: [id number] DOB:27-Jul-1945, 77 y.o., female Today's Date: 01/13/2023  END OF SESSION:  PT End of Session - 01/13/23 1138     Visit Number 1    Date for PT Re-Evaluation 03/10/23    Authorization Type [employer]    Progress Note Due on Visit 10    PT Start Time 1145    PT Stop Time 1225    PT Time Calculation (min) 40 min    Activity Tolerance Patient tolerated treatment well    Behavior During Therapy WFL for tasks assessed/performed             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)    Diabetes mellitus without complication (HCC)    Diabetic foot ulcer (HCC) 09/27/2016   Excessive hair growth 07/19/2015   Heart murmur    History of kidney stones    Hyperlipidemia    Hypertension    Neuromuscular disorder (HCC)    diabetic neuropathy   Neuropathy    Postmenopausal 07/19/2015   Past Surgical History:  Procedure Laterality Date   ANTERIOR AND POSTERIOR REPAIR N/A 04/28/2018   Procedure: ANTERIOR (CYSTOCELE) AND POSTERIOR REPAIR (RECTOCELE);  Surgeon: Tilda Burrow, MD;  Location: [facility];  Service: Gynecology;  Laterality: N/A;   APPENDECTOMY     BREAST BIOPSY Left 2023   invasive ca   BREAST LUMPECTOMY Left 2023   invasive ca   CARPAL TUNNEL RELEASE Bilateral 2001   CESAREAN SECTION     COLONOSCOPY N/A 08/28/2012   Procedure: COLONOSCOPY;  Surgeon: Malissa Hippo, MD;  Location: [facility];  Service: Endoscopy;  Laterality: N/A;  915   COLONOSCOPY N/A 12/25/2017   Procedure: COLONOSCOPY;  Surgeon: Malissa Hippo, MD;  Location: [facility];  Service: Endoscopy;  Laterality: N/A;  12:45   REVERSE SHOULDER ARTHROPLASTY Left 07/10/2020   Procedure: REVERSE SHOULDER ARTHROPLASTY;  Surgeon: Oliver Barre, MD;  Location: [facility];  Service: Orthopedics;  Laterality: Left;    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)    Breast cancer of upper-outer quadrant of left female breast (HCC) 11/12/2021   Cholelithiasis 10/29/2020   Rotator cuff arthropathy of left shoulder 07/10/2020   CKD (chronic kidney disease) stage 3, GFR 30-59 ml/min (HCC) 09/27/2016   Hyperlipidemia 09/27/2016   Essential hypertension, benign 08/11/2012    PCP: [name], DO  REFERRING PROVIDER: Myna Hidalgo, DO  REFERRING DIAG: R15.9,K59.00 (ICD-10-CM) - Fecal incontinence alternating with constipation N39.46 (ICD-10-CM) - Mixed stress and urge urinary incontinence  THERAPY DIAG:  Abnormal posture  Muscle weakness (generalized)  Unspecified lack of coordination  Rationale for Evaluation and Treatment: Rehabilitation  ONSET DATE: April 2024  SUBJECTIVE:  SUBJECTIVE STATEMENT: Pt states that every time she urinates, she has a very hard ball of fecal matter. When she does have bowel movement, she reports have to strain very hard and only has small peddles. She is using Miralax on a regular basis. She states that pelvic floor PT was helpful. MD tried a pessary, but very painful.   Fluid intake: Yes: was drinking 70oz a day, but got tired of it; now she drinks at least 5 bottles a day; some tea and coffee    PAIN:  Are you having pain? No   PRECAUTIONS: Other: Breast cancer dx a year ago - no radiation/chemo   RED FLAGS: None   WEIGHT BEARING RESTRICTIONS: No  FALLS:  Has patient fallen in last 6 months? Yes. Number of  falls 2x  LIVING ENVIRONMENT: Lives with: lives alone Lives in: House/apartment   OCCUPATION: retired   PLOF: Independent  PATIENT GOALS: get control over bowel movement and have one without straining so hard   PERTINENT HISTORY:  Anterior/posterior vaginal wall repair; breast cancer 2023/lumpectomy, appendectomy, c-section x2,   BOWEL MOVEMENT: Pain with bowel movement: No Type of bowel movement:Type (Bristol Stool Scale) 1, Frequency 2-3 days , and Strain Yes Fully empty rectum: No - she is not using squatty potty  Leakage: Yes: very occasionally she will find ball of stool in pad when she goes to urinate Pads: Yes: 1-2/day Fiber supplement: No  URINATION: Pain with urination: No Fully empty bladder: Yes: but then she'll have to come back once she gets up Stream: Strong Urgency: Yes: at night  Frequency: every 3 hours  Leakage: Coughing and at night not getting up in time; very rarely Pads: Yes: 1-2/day  INTERCOURSE: Not sexually active, no history of pain   PREGNANCY: Vaginal deliveries 0 C-section deliveries 3   PROLAPSE: Ball in vagina   OBJECTIVE:  Note: Objective measures were completed at Evaluation unless otherwise noted.  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 2 second single leg stance on Lt LE with extreme compensation with weight shift over Lt LE  GAIT: Comments: Lt compensated trendelenburg, Rt antalgic gait pattern   POSTURE: rounded shoulders, forward head, decreased lumbar lordosis, increased thoracic kyphosis, posterior pelvic tilt, and Rt thoracic curvature  LUMBARAROM/PROM:  A/PROM A/PROM  Eval (% available)  Flexion 80  Extension 75  Right  lateral flexion 50  Left lateral flexion 50  Right rotation 50  Left rotation 50   (Blank rows = not tested)   PALPATION:   General  tenderness in Lt lower quadrant                External Perineal Exam redness/fragile looking tissue                             Internal Pelvic Floor tissue tenderness throughout; very sensitive to any movement  Patient confirms identification and approves PT to assess internal pelvic floor and treatment Yes  PELVIC MMT:   MMT eval  Vaginal 1/5, poor coordination of active contraction vs bearing down and feeling when she is doing each one; 3 second hold; 3 repeat contractions  Internal Anal Sphincter 0-1/5  External Anal Sphincter  0-1/5  Puborectalis 0-1/5  Diastasis Recti 4 finger width separation/distortion with increased pressure   (Blank rows = not tested)        TONE: low  PROLAPSE: Grade 2 anterior vaginal wall laxity  TODAY'S TREATMENT:                                                                                                                              DATE:  01/12/26  EVAL  Neuromuscular re-education: Pt provides verbal consent for internal vaginal/rectal pelvic floor exam. Pelvic floor contraction training with internal vaginal and rectal feedback Quick flicks Long holds   PATIENT EDUCATION:  Education details: See above Person educated: Patient Education method: Explanation, Demonstration, Tactile cues, Verbal cues, and Handouts Education comprehension: verbalized understanding  HOME EXERCISE PROGRAM: [id number]  ASSESSMENT:  CLINICAL IMPRESSION: Patient is a 77 y.o. female who was seen today for physical therapy evaluation and treatment for difficulty with bowel movements and fecal incontinence. Exam findings notable for abnormal gait/posture, decreased functional core strength and balance, decreased pelvic floor muscle and external anal sphincter strength, poor coordination with pelvic floor contraction having  tendency to bear down and bulge, poor proprioception of doing correct pelvic floor contraction vs bearing down, decreased ability to perform repeated pelvic floor contractions, grade 2 anterior vaginal wall laxity, 4 finger abdominal separation with distortion, tenderness with internal pelvic floor muscle assessment, and significant dryness/redness of vulvar tissue. Signs and symptoms are most consistent with pelvic floor muscle weakness, poor pressure management, and bad evacuation habits. Initial treatment consisted of pelvic floor muscle contraction training with internal vaignal/rectal feedback. She will continue to benefit from skilled PT intervention in order to improve ability to have bowel movement without straining, decrease fecal incontinence, decrease stress urinary incontinence, and begin/progress functional strengthening program.   *Pt used bleach wipes to clean vulva and skin surrounding pelvis after pelvic floor muscle exam instead of the castile soap wipes that were provided. She was encouraged to clean the area well when she gets home due to possible irritation.   OBJECTIVE IMPAIRMENTS: decreased activity tolerance, decreased coordination, decreased endurance, decreased strength, increased fascial restrictions, increased muscle spasms, impaired tone, postural dysfunction, and pain.   ACTIVITY LIMITATIONS: continence  PARTICIPATION LIMITATIONS: community activity  PERSONAL FACTORS: 1 comorbidity: medical history  are also affecting patient's functional outcome.   REHAB POTENTIAL: Good  CLINICAL DECISION MAKING: Stable/uncomplicated  EVALUATION COMPLEXITY: Low   GOALS: Goals reviewed with patient? Yes  SHORT TERM GOALS: Target date: 02/10/23  Pt will be independent with HEP.   Baseline: Goal status: INITIAL  2.  Pt will be able to consistently perform pelvic floor muscle contraction with full A/ROM and appropriate breath coordination.  Baseline:  Goal status:  INITIAL  3.  Pt will be independent with double voiding in order to completely empty bladder. Baseline:  Goal status: INITIAL  4.  Pt will be independent with use of squatty potty, relaxed toileting mechanics, and improved  bowel movement techniques in order to increase ease of bowel movements and complete evacuation.   Baseline:  Goal status: INITIAL    LONG TERM GOALS: Target date: 03/10/2023  Pt will be independent with advanced HEP.   Baseline:  Goal status: INITIAL  2.  Pt will demonstrate increased pelvic floor muscle and external anal sphincter strength to consistently 3/5 in order to decrease episodes of fecal incontinence.   Baseline:  Goal status: INITIAL  3.  Pt will report no episodes of urinary or fecal incontinence in order to improve confidence in community activities and personal hygiene.   Baseline:  Goal status: INITIAL  4.  Pt will be able to have bowel movement every other day without straining and sensation of complete evacuation.  Baseline:  Goal status: INITIAL  PLAN:  PT FREQUENCY: 1-2x/week  PT DURATION: 8 weeks  PLANNED INTERVENTIONS: Therapeutic exercises, Therapeutic activity, Neuromuscular re-education, Balance training, Gait training, Patient/Family education, Self Care, Joint mobilization, Dry Needling, Biofeedback, and Manual therapy  PLAN FOR NEXT SESSION: Core training/progressions; bowel massage; inverted lying.    Julio Alm, PT, DPT10/07/241:23 PM

## 2023-01-13 NOTE — Patient Instructions (Addendum)
Squatty potty: When your knees are level or below the level of your hips, pelvic floor muscles are pressed against rectum, preventing ease of bowel movement. By getting knees above the level of the hips, these pelvic floor muscles relax, allowing easier passage of bowel movement.  Ways to get knees above hips: o Squatty Potty (7inch and 9inch versions) o Small stool o Roll of toilet paper under each foot o Hardback book or stack of magazines under each foot  Relaxed Toileting mechanics: Once in this position, make sure to lean forward with forearms on thighs, wide knees, relaxed stomach, and breathe.    Bowel massage: To assist with more regular and more comfortable bowel movements, try performing bowel massage nightly for 5-10 minutes. Place hands in the lower right side of your abdomen to start; in small circles, massage up, across, and down the left side of your abdomen. Pressure does not need to be hard, but just comfortable. You can use lotion or oil to make more comfortable.      [facility] [address], [place], [place] [address] Phone # [phone] Fax [phone]

## 2023-01-17 ENCOUNTER — Encounter: Payer: Self-pay | Admitting: Hematology

## 2023-01-17 ENCOUNTER — Ambulatory Visit
Admission: EM | Admit: 2023-01-17 | Discharge: 2023-01-17 | Disposition: A | Discharge status: Home / Self Care | Payer: Medicare Other | Attending: Nurse Practitioner | Admitting: Nurse Practitioner

## 2023-01-17 DIAGNOSIS — J22 Unspecified acute lower respiratory infection: Secondary | ICD-10-CM

## 2023-01-17 LAB — POCT RAPID STREP A (OFFICE): Rapid Strep A Screen: NEGATIVE

## 2023-01-17 MED ORDER — FLUTICASONE PROPIONATE 50 MCG/ACT NA SUSP: 2.0000 | Freq: Every day | NASAL | 0 refills | Status: DC

## 2023-01-17 MED ORDER — AMOXICILLIN-POT CLAVULANATE 875-125 MG PO TABS: 1.0000 | ORAL_TABLET | Freq: Two times a day (BID) | ORAL | 0 refills | Status: DC

## 2023-01-17 NOTE — Discharge Instructions (Signed)
Take medication as prescribed. May take over-the-counter Tylenol as needed for pain, fever, or general discomfort. Recommend normal saline nasal spray throughout the day to help with nasal congestion and runny nose. For the cough, it may be helpful to use of a humidifier in your bedroom at nighttime during sleep and sleep elevated on pillows while cough symptoms persist. If symptoms do not improve with this treatment, please follow-up with your primary care physician for further evaluation. Follow-up as needed.

## 2023-01-17 NOTE — ED Triage Notes (Signed)
Pt reports throat pain, coughing, and can't sleep x 1 week.

## 2023-01-17 NOTE — ED Provider Notes (Signed)
[facility]    CSN: [id number] Arrival date & time: 01/17/23  1337      History   Chief Complaint No chief complaint on file.   HPI Stacy Webb is a 77 y.o. female.   The history is provided by the patient.   Patient presents for complaints of sore throat, coughing, and nasal congestion has been present for the past week.  States sore throat has since improved.  Patient denies fever, chills, chest pain, wheezing, difficulty breathing, abdominal pain, nausea, vomiting, diarrhea, or rash.  Patient reports that her sister had COVID approximately one 1 week ago, but she has not been around her.  Patient denies any obvious known sick contacts.  Reports she has tried taking an old prescription of Tessalon Perles with minimal relief.  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)    Diabetes mellitus without complication (HCC)    Diabetic foot ulcer (HCC) 09/27/2016   Excessive hair growth 07/19/2015   Heart murmur    History of kidney stones    Hyperlipidemia    Hypertension    Neuromuscular disorder (HCC)    diabetic neuropathy   Neuropathy    Postmenopausal 07/19/2015    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)    Breast cancer of upper-outer quadrant of left female breast (HCC) 11/12/2021   Cholelithiasis 10/29/2020   Rotator cuff arthropathy of left shoulder 07/10/2020   CKD (chronic kidney disease) stage 3, GFR 30-59 ml/min (HCC) 09/27/2016   Hyperlipidemia 09/27/2016   Essential hypertension, benign 08/11/2012    Past Surgical History:  Procedure Laterality Date   ANTERIOR AND POSTERIOR REPAIR N/A  04/28/2018   Procedure: ANTERIOR (CYSTOCELE) AND POSTERIOR REPAIR (RECTOCELE);  Surgeon: Tilda Burrow, MD;  Location: [facility];  Service: Gynecology;  Laterality: N/A;   APPENDECTOMY     BREAST BIOPSY Left 2023   invasive ca   BREAST LUMPECTOMY Left 2023   invasive ca   CARPAL TUNNEL RELEASE Bilateral 2001   CESAREAN SECTION     COLONOSCOPY N/A 08/28/2012   Procedure: COLONOSCOPY;  Surgeon: Malissa Hippo, MD;  Location: [facility];  Service: Endoscopy;  Laterality: N/A;  915   COLONOSCOPY N/A 12/25/2017   Procedure: COLONOSCOPY;  Surgeon: Malissa Hippo, MD;  Location: [facility];  Service: Endoscopy;  Laterality: N/A;  12:45   REVERSE SHOULDER ARTHROPLASTY Left 07/10/2020   Procedure: REVERSE SHOULDER ARTHROPLASTY;  Surgeon: Oliver Barre, MD;  Location: [facility];  Service: Orthopedics;  Laterality: Left;    OB History     Gravida  2   Para  2   Term  2   Preterm      AB      Living  1      SAB      IAB      Ectopic      Multiple  1   Live Births  3            Home Medications    Prior to Admission medications   Medication Sig Start Date End Date Taking?  Authorizing Provider  amoxicillin-clavulanate (AUGMENTIN) 875-125 MG tablet Take 1 tablet by mouth every 12 (twelve) hours. 01/17/23  Yes Karilynn Carranza-Warren, Sadie Haber, NP  fluticasone (FLONASE) 50 MCG/ACT nasal spray Place 2 sprays into both nostrils daily. 01/17/23  Yes Gurinder Toral-Warren, Sadie Haber, NP  atorvastatin (LIPITOR) 20 MG tablet TAKE 1 TABLET BY MOUTH DAILY 10/07/22   Everlene Other G, DO  blood glucose meter kit and supplies Dispense based on patient and insurance preference. Use up to four times daily as directed. (FOR ICD-10 E10.9, E11.9). 03/21/21   Heather Roberts, NP  calcium-vitamin D (OSCAL WITH D) 500-5 MG-MCG tablet Take 1 tablet by mouth.    [provider]  diltiazem (CARDIZEM LA) 240 MG 24 hr tablet TAKE 1 TABLET BY MOUTH DAILY 12/03/22   Everlene Other G, DO  docusate sodium  (COLACE) 100 MG capsule Take 100 mg by mouth at bedtime.    [provider]  glimepiride (AMARYL) 1 MG tablet Take 1 tablet (1 mg total) by mouth daily with breakfast. 05/03/22   [name], DO  glucose blood (ACCU-CHEK GUIDE) test strip Tests once a day 06/11/22   [name], DO  hydrochlorothiazide (HYDRODIURIL) 25 MG tablet TAKE 1 TABLET BY MOUTH DAILY 06/27/22   Kerri Perches, MD  tamoxifen (NOLVADEX) 10 MG tablet Take 1 tablet (10 mg total) by mouth daily. 10/15/22   [name], MD    Family History Family History  Problem Relation Age of Onset   Pneumonia Son    Suicidality Son    Diabetes Daughter    Colon cancer Paternal Aunt     Social History Social History   Tobacco Use   Smoking status: Never   Smokeless tobacco: Never  Vaping Use   Vaping status: Never Used  Substance Use Topics   Alcohol use: No   Drug use: No     Allergies   Clindamycin/lincomycin and Enalapril   Review of Systems Review of Systems Per HPI  Physical Exam Triage Vital Signs ED Triage Vitals [01/17/23 1512]  Encounter Vitals Group     BP (!) 145/68     Systolic BP Percentile      Diastolic BP Percentile      Pulse Rate 82     Resp 16     Temp 98.1 F (36.7 C)     Temp Source Oral     SpO2 94 %     Weight      Height      Head Circumference      Peak Flow      Pain Score 0     Pain Loc      Pain Education      Exclude from Growth Chart    No data found.  Updated Vital Signs BP (!) 145/68 (BP Location: Right Arm)   Pulse 82   Temp 98.1 F (36.7 C) (Oral)   Resp 16   SpO2 94%   Visual Acuity Right Eye Distance:   Left Eye Distance:   Bilateral Distance:    Right Eye Near:   Left Eye Near:    Bilateral Near:     Physical Exam Vitals and nursing note reviewed.  Constitutional:      General: She is not in acute distress.    Appearance: Normal appearance.  HENT:     Head: Normocephalic.     Right Ear: Tympanic membrane, ear canal and  external ear normal.     Left Ear: Tympanic  membrane, ear canal and external ear normal.     Nose: Congestion present.     Mouth/Throat:     Mouth: Mucous membranes are moist.     Comments: Cobblestoning present to posterior oropharynx  Eyes:     Extraocular Movements: Extraocular movements intact.     Pupils: Pupils are equal, round, and reactive to light.  Cardiovascular:     Rate and Rhythm: Normal rate and regular rhythm.     Pulses: Normal pulses.     Heart sounds: Normal heart sounds.  Pulmonary:     Effort: Pulmonary effort is normal. No respiratory distress.     Breath sounds: Normal breath sounds. No stridor. No wheezing, rhonchi or rales.  Abdominal:     General: Bowel sounds are normal.     Palpations: Abdomen is soft.     Tenderness: There is no abdominal tenderness.  Musculoskeletal:     Cervical back: Normal range of motion.  Lymphadenopathy:     Cervical: No cervical adenopathy.  Skin:    General: Skin is warm and dry.  Neurological:     General: No focal deficit present.     Mental Status: She is alert and oriented to person, place, and time.  Psychiatric:        Mood and Affect: Mood normal.        Behavior: Behavior normal.      UC Treatments / Results  Labs (all labs ordered are listed, but only abnormal results are displayed) Labs Reviewed  POCT RAPID STREP A (OFFICE)    EKG   Radiology No results found.  Procedures Procedures (including critical care time)  Medications Ordered in UC Medications - No data to display  Initial Impression / Assessment and Plan / UC Course  I have reviewed the triage vital signs and the nursing notes.  Pertinent labs & imaging results that were available during my care of the patient were reviewed by me and considered in my medical decision making (see chart for details).  The patient is well-appearing, she is in no acute distress, vital signs are stable.  Rapid strep test was negative. Patient with cough  has been present for approximately 1 week, with no improvement of her symptoms.  Given the patient's attempt to use medication with minimal relief, we will treat empirically with Augmentin 875/125 mg tablets.  Fluticasone 50 mcg nasal spray also prescribed for nasal congestion and postnasal drainage.  Supportive care recommendations were provided and discussed with the patient to include over-the-counter Tylenol, normal saline nasal spray, and use of a humidifier in her bedroom at nighttime during sleep.  Patient was advised if symptoms fail to improve, it is recommended that she follow-up with her primary care physician for further evaluation.  Patient is in agreement with this plan of care and verbalizes understanding.  All questions were answered.  Patient stable for discharge.  Final Clinical Impressions(s) / UC Diagnoses   Final diagnoses:  Lower respiratory infection     Discharge Instructions      Take medication as prescribed. May take over-the-counter Tylenol as needed for pain, fever, or general discomfort. Recommend normal saline nasal spray throughout the day to help with nasal congestion and runny nose. For the cough, it may be helpful to use of a humidifier in your bedroom at nighttime during sleep and sleep elevated on pillows while cough symptoms persist. If symptoms do not improve with this treatment, please follow-up with your primary care physician for further evaluation. Follow-up as  needed.     ED Prescriptions     Medication Sig Dispense Auth. Provider   amoxicillin-clavulanate (AUGMENTIN) 875-125 MG tablet Take 1 tablet by mouth every 12 (twelve) hours. 14 tablet Jahlon Baines-Warren, Sadie Haber, NP   fluticasone (FLONASE) 50 MCG/ACT nasal spray Place 2 sprays into both nostrils daily. 16 g Jahmier Willadsen-Warren, Sadie Haber, NP      PDMP not reviewed this encounter.   Abran Cantor, NP 01/17/23 [phone]

## 2023-01-20 ENCOUNTER — Ambulatory Visit: Payer: Medicare Other

## 2023-01-20 DIAGNOSIS — R279 Unspecified lack of coordination: Secondary | ICD-10-CM | POA: Diagnosis not present

## 2023-01-20 DIAGNOSIS — R159 Full incontinence of feces: Secondary | ICD-10-CM | POA: Diagnosis not present

## 2023-01-20 DIAGNOSIS — K5900 Constipation, unspecified: Secondary | ICD-10-CM | POA: Diagnosis not present

## 2023-01-20 DIAGNOSIS — R293 Abnormal posture: Secondary | ICD-10-CM | POA: Diagnosis not present

## 2023-01-20 DIAGNOSIS — M6281 Muscle weakness (generalized): Secondary | ICD-10-CM

## 2023-01-20 NOTE — Therapy (Signed)
OUTPATIENT PHYSICAL THERAPY FEMALE PELVIC EVALUATION   Patient Name: Stacy Webb MRN: [id number] DOB:09-Jan-1946, 77 y.o., female Today's Date: 01/20/2023  END OF SESSION:  PT End of Session - 01/20/23 1147     Visit Number 2    Date for PT Re-Evaluation 03/10/23    Authorization Type [employer]    Progress Note Due on Visit 10    PT Start Time 1145    PT Stop Time 1225    PT Time Calculation (min) 40 min    Activity Tolerance Patient tolerated treatment well    Behavior During Therapy WFL for tasks assessed/performed             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)    Diabetes mellitus without complication (HCC)    Diabetic foot ulcer (HCC) 09/27/2016   Excessive hair growth 07/19/2015   Heart murmur    History of kidney stones    Hyperlipidemia    Hypertension    Neuromuscular disorder (HCC)    diabetic neuropathy   Neuropathy    Postmenopausal 07/19/2015   Past Surgical History:  Procedure Laterality Date   ANTERIOR AND POSTERIOR REPAIR N/A 04/28/2018   Procedure: ANTERIOR (CYSTOCELE) AND POSTERIOR REPAIR (RECTOCELE);  Surgeon: Tilda Burrow, MD;  Location: [facility];  Service: Gynecology;  Laterality: N/A;   APPENDECTOMY     BREAST BIOPSY Left 2023   invasive ca   BREAST LUMPECTOMY Left 2023   invasive ca   CARPAL TUNNEL RELEASE Bilateral 2001   CESAREAN SECTION     COLONOSCOPY N/A 08/28/2012   Procedure: COLONOSCOPY;  Surgeon: Malissa Hippo, MD;  Location: [facility];  Service: Endoscopy;  Laterality: N/A;  915   COLONOSCOPY N/A 12/25/2017   Procedure: COLONOSCOPY;  Surgeon: Malissa Hippo, MD;  Location: [facility];  Service: Endoscopy;  Laterality: N/A;  12:45   REVERSE SHOULDER ARTHROPLASTY Left 07/10/2020   Procedure: REVERSE SHOULDER ARTHROPLASTY;  Surgeon: Oliver Barre, MD;  Location: [facility];  Service: Orthopedics;  Laterality: Left;    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)    Breast cancer of upper-outer quadrant of left female breast (HCC) 11/12/2021   Cholelithiasis 10/29/2020   Rotator cuff arthropathy of left shoulder 07/10/2020   CKD (chronic kidney disease) stage 3, GFR 30-59 ml/min (HCC) 09/27/2016   Hyperlipidemia 09/27/2016   Essential hypertension, benign 08/11/2012    PCP: [name], DO  REFERRING PROVIDER: Myna Hidalgo, DO  REFERRING DIAG: R15.9,K59.00 (ICD-10-CM) - Fecal incontinence alternating with constipation N39.46 (ICD-10-CM) - Mixed stress and urge urinary incontinence  THERAPY DIAG:  Abnormal posture  Muscle weakness (generalized)  Unspecified lack of coordination  Rationale for Evaluation and Treatment: Rehabilitation  ONSET DATE: April 2024  SUBJECTIVE:  SUBJECTIVE STATEMENT: Pt states that bowel movements are better. They are regular and more complete.   PAIN:  Are you having pain? No   PRECAUTIONS: Other: Breast cancer dx a year ago - no radiation/chemo   RED FLAGS: None   WEIGHT BEARING RESTRICTIONS: No  FALLS:  Has patient fallen in last 6 months? Yes. Number of falls 2x  LIVING ENVIRONMENT: Lives with: lives alone Lives in: House/apartment   OCCUPATION: retired   PLOF: Independent  PATIENT GOALS: get control over bowel movement and have one without straining so hard   PERTINENT HISTORY:  Anterior/posterior vaginal wall repair; breast cancer 2023/lumpectomy, appendectomy, c-section x2,   BOWEL  MOVEMENT: Pain with bowel movement: No Type of bowel movement:Type (Bristol Stool Scale) 1, Frequency 2-3 days , and Strain Yes Fully empty rectum: No - she is not using squatty potty  Leakage: Yes: very occasionally she will find ball of stool in pad when she goes to urinate Pads: Yes: 1-2/day Fiber supplement: No  URINATION: Pain with urination: No Fully empty bladder: Yes: but then she'll have to come back once she gets up Stream: Strong Urgency: Yes: at night  Frequency: every 3 hours  Leakage: Coughing and at night not getting up in time; very rarely Pads: Yes: 1-2/day  INTERCOURSE: Not sexually active, no history of pain   PREGNANCY: Vaginal deliveries 0 C-section deliveries 3   PROLAPSE: Ball in vagina   OBJECTIVE:  Note: Objective measures were completed at Evaluation unless otherwise noted.  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 2 second single leg stance on Lt LE with extreme compensation with weight shift over Lt LE  GAIT: Comments: Lt compensated trendelenburg, Rt antalgic gait pattern   POSTURE: rounded shoulders, forward head, decreased lumbar lordosis, increased thoracic kyphosis, posterior pelvic tilt, and Rt thoracic curvature  LUMBARAROM/PROM:  A/PROM A/PROM  Eval (% available)  Flexion 80  Extension 75  Right lateral flexion 50  Left lateral flexion 50  Right rotation 50  Left rotation 50   (Blank rows = not tested)   PALPATION:   General  tenderness in Lt lower quadrant                External Perineal Exam redness/fragile looking tissue                             Internal Pelvic Floor tissue tenderness throughout; very sensitive to any  movement  Patient confirms identification and approves PT to assess internal pelvic floor and treatment Yes  PELVIC MMT:   MMT eval  Vaginal 1/5, poor coordination of active contraction vs bearing down and feeling when she is doing each one; 3 second hold; 3 repeat contractions  Internal Anal Sphincter 0-1/5  External Anal Sphincter 0-1/5  Puborectalis 0-1/5  Diastasis Recti 4 finger width separation/distortion with increased pressure   (Blank rows = not tested)        TONE: low  PROLAPSE: Grade 2 anterior vaginal wall laxity  TODAY'S TREATMENT:  DATE:  01/20/23 Manual: Bowel mobilization Neuromuscular re-education: Pelvic floor contractions in seated position with verbal cues  Quick flicks Long holds Seated hip adduction with pelvic floor/core contraction 2 x 10 Seated hip abduction with pelvic floor/core contraction 2 x 10  01/12/26  EVAL  Neuromuscular re-education: Pt provides verbal consent for internal vaginal/rectal pelvic floor exam. Pelvic floor contraction training with internal vaginal and rectal feedback Quick flicks Long holds   PATIENT EDUCATION:  Education details: See above Person educated: Patient Education method: Explanation, Demonstration, Tactile cues, Verbal cues, and Handouts Education comprehension: verbalized understanding  HOME EXERCISE PROGRAM: [id number]  ASSESSMENT:  CLINICAL IMPRESSION: Pt is now using squatty potty and has had significant improvement in bowel movements over the last week. She did very well with seated core/hip strengthening with pelvic floor. We reviewed initial HEP of pelvic floor contractions in seated position as well. HEP updated. She was also instructed in bowel mobilization and this was performed today with good tolerance. She will continue to benefit from skilled PT intervention in order to  improve ability to have bowel movement without straining, decrease fecal incontinence, decrease stress urinary incontinence, and begin/progress functional strengthening program.    OBJECTIVE IMPAIRMENTS: decreased activity tolerance, decreased coordination, decreased endurance, decreased strength, increased fascial restrictions, increased muscle spasms, impaired tone, postural dysfunction, and pain.   ACTIVITY LIMITATIONS: continence  PARTICIPATION LIMITATIONS: community activity  PERSONAL FACTORS: 1 comorbidity: medical history  are also affecting patient's functional outcome.   REHAB POTENTIAL: Good  CLINICAL DECISION MAKING: Stable/uncomplicated  EVALUATION COMPLEXITY: Low   GOALS: Goals reviewed with patient? Yes  SHORT TERM GOALS: Target date: 02/10/23  Pt will be independent with HEP.   Baseline: Goal status: INITIAL  2.  Pt will be able to consistently perform pelvic floor muscle contraction with full A/ROM and appropriate breath coordination.  Baseline:  Goal status: INITIAL  3.  Pt will be independent with double voiding in order to completely empty bladder. Baseline:  Goal status: INITIAL  4.  Pt will be independent with use of squatty potty, relaxed toileting mechanics, and improved bowel movement techniques in order to increase ease of bowel movements and complete evacuation.   Baseline:  Goal status: INITIAL    LONG TERM GOALS: Target date: 03/10/2023  Pt will be independent with advanced HEP.   Baseline:  Goal status: INITIAL  2.  Pt will demonstrate increased pelvic floor muscle and external anal sphincter strength to consistently 3/5 in order to decrease episodes of fecal incontinence.   Baseline:  Goal status: INITIAL  3.  Pt will report no episodes of urinary or fecal incontinence in order to improve confidence in community activities and personal hygiene.   Baseline:  Goal status: INITIAL  4.  Pt will be able to have bowel movement every  other day without straining and sensation of complete evacuation.  Baseline:  Goal status: INITIAL  PLAN:  PT FREQUENCY: 1-2x/week  PT DURATION: 8 weeks  PLANNED INTERVENTIONS: Therapeutic exercises, Therapeutic activity, Neuromuscular re-education, Balance training, Gait training, Patient/Family education, Self Care, Joint mobilization, Dry Needling, Biofeedback, and Manual therapy  PLAN FOR NEXT SESSION: Core training/progressions; bowel massage; inverted lying.    Julio Alm, PT, DPT10/14/2412:30 PM

## 2023-01-26 ENCOUNTER — Other Ambulatory Visit: Payer: Self-pay | Admitting: Family Medicine

## 2023-01-27 ENCOUNTER — Ambulatory Visit: Payer: Medicare Other

## 2023-01-27 DIAGNOSIS — R279 Unspecified lack of coordination: Secondary | ICD-10-CM

## 2023-01-27 DIAGNOSIS — K5900 Constipation, unspecified: Secondary | ICD-10-CM | POA: Diagnosis not present

## 2023-01-27 DIAGNOSIS — R293 Abnormal posture: Secondary | ICD-10-CM | POA: Diagnosis not present

## 2023-01-27 DIAGNOSIS — R159 Full incontinence of feces: Secondary | ICD-10-CM | POA: Diagnosis not present

## 2023-01-27 DIAGNOSIS — R278 Other lack of coordination: Secondary | ICD-10-CM

## 2023-01-27 DIAGNOSIS — M6281 Muscle weakness (generalized): Secondary | ICD-10-CM | POA: Diagnosis not present

## 2023-01-27 DIAGNOSIS — R269 Unspecified abnormalities of gait and mobility: Secondary | ICD-10-CM

## 2023-01-27 NOTE — Therapy (Signed)
OUTPATIENT PHYSICAL THERAPY FEMALE PELVIC EVALUATION   Patient Name: Stacy Webb MRN: [id number] DOB:09-18-1945, 77 y.o., female Today's Date: 01/27/2023  END OF SESSION:  PT End of Session - 01/27/23 1150     Visit Number 3    Date for PT Re-Evaluation 03/10/23    Authorization Type [employer] Medicare    Progress Note Due on Visit 10    PT Start Time 1145    PT Stop Time 1225    PT Time Calculation (min) 40 min    Activity Tolerance Patient tolerated treatment well    Behavior During Therapy WFL for tasks assessed/performed              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)    Diabetes mellitus without complication (HCC)    Diabetic foot ulcer (HCC) 09/27/2016   Excessive hair growth 07/19/2015   Heart murmur    History of kidney stones    Hyperlipidemia    Hypertension    Neuromuscular disorder (HCC)    diabetic neuropathy   Neuropathy    Postmenopausal 07/19/2015   Past Surgical History:  Procedure Laterality Date   ANTERIOR AND POSTERIOR REPAIR N/A 04/28/2018   Procedure: ANTERIOR (CYSTOCELE) AND POSTERIOR REPAIR (RECTOCELE);  Surgeon: Tilda Burrow, MD;  Location: [facility];  Service: Gynecology;  Laterality: N/A;   APPENDECTOMY     BREAST BIOPSY Left 2023   invasive ca   BREAST LUMPECTOMY Left 2023   invasive ca   CARPAL TUNNEL RELEASE Bilateral 2001   CESAREAN SECTION     COLONOSCOPY N/A 08/28/2012   Procedure: COLONOSCOPY;  Surgeon: Malissa Hippo, MD;  Location: [facility];  Service: Endoscopy;  Laterality: N/A;  915   COLONOSCOPY N/A 12/25/2017   Procedure: COLONOSCOPY;  Surgeon: Malissa Hippo, MD;  Location: [facility];  Service: Endoscopy;  Laterality: N/A;  12:45   REVERSE SHOULDER ARTHROPLASTY Left 07/10/2020   Procedure: REVERSE SHOULDER ARTHROPLASTY;  Surgeon: Oliver Barre, MD;  Location: [facility];  Service: Orthopedics;  Laterality: Left;    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)    Breast cancer of upper-outer quadrant of left female breast (HCC) 11/12/2021   Cholelithiasis 10/29/2020   Rotator cuff arthropathy of left shoulder 07/10/2020   CKD (chronic kidney disease) stage 3, GFR 30-59 ml/min (HCC) 09/27/2016   Hyperlipidemia 09/27/2016   Essential hypertension, benign 08/11/2012    PCP: [name], DO  REFERRING PROVIDER: Myna Hidalgo, DO  REFERRING DIAG: R15.9,K59.00 (ICD-10-CM) - Fecal incontinence alternating with constipation N39.46 (ICD-10-CM) - Mixed stress and urge urinary incontinence  THERAPY DIAG:  Abnormal posture  Muscle weakness (generalized)  Unspecified lack of coordination  Abnormality of gait and mobility  Other lack of coordination  Rationale for Evaluation and Treatment: Rehabilitation  ONSET DATE: April 2024  SUBJECTIVE:  SUBJECTIVE STATEMENT: Pt states that she had a good bowel movement this morning and she is feeling better in general. She was able to get outside and trim back some flowers.   PAIN:  Are you having pain? No   PRECAUTIONS: Other: Breast cancer dx a year ago - no radiation/chemo   RED FLAGS: None   WEIGHT BEARING RESTRICTIONS: No  FALLS:  Has patient fallen in last 6 months? Yes. Number of falls 2x  LIVING ENVIRONMENT: Lives with: lives alone Lives in: House/apartment   OCCUPATION: retired   PLOF: Independent  PATIENT GOALS: get control over bowel movement and have one without straining so hard    PERTINENT HISTORY:  Anterior/posterior vaginal wall repair; breast cancer 2023/lumpectomy, appendectomy, c-section x2,   BOWEL MOVEMENT: Pain with bowel movement: No Type of bowel movement:Type (Bristol Stool Scale) 1, Frequency 2-3 days , and Strain Yes Fully empty rectum: No - she is not using squatty potty  Leakage: Yes: very occasionally she will find ball of stool in pad when she goes to urinate Pads: Yes: 1-2/day Fiber supplement: No  URINATION: Pain with urination: No Fully empty bladder: Yes: but then she'll have to come back once she gets up Stream: Strong Urgency: Yes: at night  Frequency: every 3 hours  Leakage: Coughing and at night not getting up in time; very rarely Pads: Yes: 1-2/day  INTERCOURSE: Not sexually active, no history of pain   PREGNANCY: Vaginal deliveries 0 C-section deliveries 3   PROLAPSE: Ball in vagina   OBJECTIVE:  Note: Objective measures were completed at Evaluation unless otherwise noted.  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 2 second single leg stance on Lt LE with extreme compensation with weight shift over Lt LE  GAIT: Comments: Lt compensated trendelenburg, Rt antalgic gait pattern   POSTURE: rounded shoulders, forward head, decreased lumbar lordosis, increased thoracic kyphosis, posterior pelvic tilt, and Rt thoracic curvature  LUMBARAROM/PROM:  A/PROM A/PROM  Eval (% available)  Flexion 80  Extension 75  Right lateral flexion 50  Left lateral flexion 50  Right rotation 50  Left rotation 50   (Blank rows = not tested)   PALPATION:   General  tenderness in Lt lower quadrant                External Perineal Exam  redness/fragile looking tissue                             Internal Pelvic Floor tissue tenderness throughout; very sensitive to any movement  Patient confirms identification and approves PT to assess internal pelvic floor and treatment Yes  PELVIC MMT:   MMT eval  Vaginal 1/5, poor coordination of active contraction vs bearing down and feeling when she is doing each one; 3 second hold; 3 repeat contractions  Internal Anal Sphincter 0-1/5  External Anal Sphincter 0-1/5  Puborectalis 0-1/5  Diastasis Recti 4 finger width separation/distortion with increased pressure   (Blank rows = not tested)        TONE: low  PROLAPSE: Grade 2 anterior vaginal wall laxity  TODAY'S TREATMENT:  DATE:  01/27/23 Neuromuscular re-education: Bridge with ball squeeze, transversus abdominus, and pelvic floor muscle 2 x 10 Seated hip abduction red band 2 x 10 with pelvic floor muscle and transversus abdominus  Seated march red band 2 x 10 with pelvic floor muscle and transversus abdominus Exercises: Lower trunk rotation 2 x 10 Single knee to chest 10x bil Bent knee fall out 10x bil Clam shell 2 x 10 bil Reverse clam shell 2 x 10 bil   01/20/23 Manual: Bowel mobilization Neuromuscular re-education: Pelvic floor contractions in seated position with verbal cues  Quick flicks Long holds Seated hip adduction with pelvic floor/core contraction 2 x 10 Seated hip abduction with pelvic floor/core contraction 2 x 10  01/12/26  EVAL  Neuromuscular re-education: Pt provides verbal consent for internal vaginal/rectal pelvic floor exam. Pelvic floor contraction training with internal vaginal and rectal feedback Quick flicks Long holds   PATIENT EDUCATION:  Education details: See above Person educated: Patient Education method: Programmer, multimedia, Demonstration, Tactile cues, Verbal  cues, and Handouts Education comprehension: verbalized understanding  HOME EXERCISE PROGRAM: [id number]  ASSESSMENT:  CLINICAL IMPRESSION: Pt doing very well with more regular bowel movements and she is not straining. Mobility activities progressed in addition to core/pelvic floor muscle strengthening. She did very well with all activities and appeared to get more motion and strength with each exercise without any report of pain. Believe these activities will continue to provide improve strength and coordination of pelvic floor to help establish more regular bowel routines. She will continue to benefit from skilled PT intervention in order to improve ability to have bowel movement without straining, decrease fecal incontinence, decrease stress urinary incontinence, and begin/progress functional strengthening program.    OBJECTIVE IMPAIRMENTS: decreased activity tolerance, decreased coordination, decreased endurance, decreased strength, increased fascial restrictions, increased muscle spasms, impaired tone, postural dysfunction, and pain.   ACTIVITY LIMITATIONS: continence  PARTICIPATION LIMITATIONS: community activity  PERSONAL FACTORS: 1 comorbidity: medical history  are also affecting patient's functional outcome.   REHAB POTENTIAL: Good  CLINICAL DECISION MAKING: Stable/uncomplicated  EVALUATION COMPLEXITY: Low   GOALS: Goals reviewed with patient? Yes  SHORT TERM GOALS: Target date: 02/10/23  Pt will be independent with HEP.   Baseline: Goal status: INITIAL  2.  Pt will be able to consistently perform pelvic floor muscle contraction with full A/ROM and appropriate breath coordination.  Baseline:  Goal status: INITIAL  3.  Pt will be independent with double voiding in order to completely empty bladder. Baseline:  Goal status: INITIAL  4.  Pt will be independent with use of squatty potty, relaxed toileting mechanics, and improved bowel movement techniques in order to  increase ease of bowel movements and complete evacuation.   Baseline:  Goal status: INITIAL    LONG TERM GOALS: Target date: 03/10/2023  Pt will be independent with advanced HEP.   Baseline:  Goal status: INITIAL  2.  Pt will demonstrate increased pelvic floor muscle and external anal sphincter strength to consistently 3/5 in order to decrease episodes of fecal incontinence.   Baseline:  Goal status: INITIAL  3.  Pt will report no episodes of urinary or fecal incontinence in order to improve confidence in community activities and personal hygiene.   Baseline:  Goal status: INITIAL  4.  Pt will be able to have bowel movement every other day without straining and sensation of complete evacuation.  Baseline:  Goal status: INITIAL  PLAN:  PT FREQUENCY: 1-2x/week  PT DURATION: 8 weeks  PLANNED INTERVENTIONS: Therapeutic exercises,  Therapeutic activity, Neuromuscular re-education, Balance training, Gait training, Patient/Family education, Self Care, Joint mobilization, Dry Needling, Biofeedback, and Manual therapy  PLAN FOR NEXT SESSION: Core training/progressions; bowel massage; inverted lying.    Julio Alm, PT, DPT10/21/2412:28 PM

## 2023-01-28 ENCOUNTER — Telehealth: Payer: Self-pay | Admitting: Family Medicine

## 2023-01-28 MED ORDER — DILTIAZEM HCL ER 240 MG PO TB24: 240.0000 mg | ORAL_TABLET | Freq: Every day | ORAL | 0 refills | Status: DC

## 2023-01-28 NOTE — Telephone Encounter (Signed)
Patient is requesting refill on diltiazem (CARDIZEM LA) 240 MG 24 hr tablet  to be sent to [facility]. She states been out for two days now waiting on mail order

## 2023-01-28 NOTE — Telephone Encounter (Signed)
Prescription sent electronically to pharmacy. Patient notified. 

## 2023-01-29 ENCOUNTER — Ambulatory Visit: Payer: Medicare Other | Admitting: Family Medicine

## 2023-02-07 ENCOUNTER — Encounter: Payer: Self-pay | Admitting: Hematology

## 2023-02-07 ENCOUNTER — Ambulatory Visit (INDEPENDENT_AMBULATORY_CARE_PROVIDER_SITE_OTHER): Payer: Medicare Other | Admitting: Family Medicine

## 2023-02-07 VITALS — BP 152/80 | HR 68 | Temp 97.8°F | Ht 66.0 in | Wt 140.8 lb

## 2023-02-07 DIAGNOSIS — M542 Cervicalgia: Secondary | ICD-10-CM | POA: Diagnosis not present

## 2023-02-07 DIAGNOSIS — R052 Subacute cough: Secondary | ICD-10-CM | POA: Diagnosis not present

## 2023-02-07 DIAGNOSIS — I10 Essential (primary) hypertension: Secondary | ICD-10-CM | POA: Diagnosis not present

## 2023-02-07 MED ORDER — VALSARTAN 80 MG PO TABS: 80.0000 mg | ORAL_TABLET | Freq: Every day | ORAL | 3 refills | Status: DC

## 2023-02-07 MED ORDER — BENZONATATE 200 MG PO CAPS: 200.0000 mg | ORAL_CAPSULE | Freq: Three times a day (TID) | ORAL | 0 refills | Status: DC | PRN

## 2023-02-07 MED ORDER — BACLOFEN 10 MG PO TABS: 5.0000 mg | ORAL_TABLET | Freq: Three times a day (TID) | ORAL | 0 refills | Status: DC | PRN

## 2023-02-07 NOTE — Patient Instructions (Signed)
Medications as prescribed.  Follow up in 2 weeks.  Take care  Dr. Adriana Simasook

## 2023-02-09 DIAGNOSIS — R059 Cough, unspecified: Secondary | ICD-10-CM | POA: Insufficient documentation

## 2023-02-09 DIAGNOSIS — M542 Cervicalgia: Secondary | ICD-10-CM | POA: Insufficient documentation

## 2023-02-09 NOTE — Assessment & Plan Note (Signed)
Stopping diltiazem.  Starting valsartan.  Follow-up in 2 weeks.

## 2023-02-09 NOTE — Progress Notes (Signed)
Subjective:  Patient ID: Stacy Webb, female    DOB: [birth year]  Age: 77 y.o. MRN: [id number]  CC:  Follow up   HPI:  77 year old female presents for follow-up.  Patient's pressures typically well-controlled.  Elevated here today.  Patient is on diltiazem and HCTZ.  Will discuss change in therapy given elevated BP as well as underlying CKD.  We have discussed this previously.  Patient reports a recent upper respiratory infection.  She continues to have cough.  She reports posterior neck pain as well.  This started on 10/26.  Patient states that she needs a prescription so that she can get inserts for her orthotics.  She has Charcot foot.  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)    Breast cancer of upper-outer quadrant of left female breast (HCC) 11/12/2021   Cholelithiasis 10/29/2020   Rotator cuff arthropathy of left shoulder 07/10/2020   CKD (chronic kidney disease) stage 3, GFR 30-59 ml/min (HCC) 09/27/2016   Hyperlipidemia 09/27/2016   Essential hypertension, benign 08/11/2012    Social Hx   Social History   Socioeconomic History   Marital status: Widowed    Spouse name: Not on file   Number of children: 3   Years of education: Not on file   Highest education level: Not on file  Occupational History   Occupation: [employer]    Comment: Medical Records  Tobacco Use   Smoking status: Never   Smokeless tobacco: Never  Vaping Use   Vaping status: Never Used  Substance and Sexual Activity   Alcohol use: No   Drug use: No   Sexual activity: Not Currently    Birth control/protection: Post-menopausal  Other Topics Concern   Not on file  Social History  Narrative   Husband had ALS and passed away.Twin boys, but one died at 50 months and her other son died in 02/23/15.      Living daughter- lives with pt.   Social Determinants of Health   Financial Resource Strain: Low Risk  (11/08/2022)   Overall Financial Resource Strain (CARDIA)    Difficulty of Paying Living Expenses: Not hard at all  Food Insecurity: No Food Insecurity (11/08/2022)   Hunger Vital Sign    Worried About Running Out of Food in the Last Year: Never true    Ran Out of Food in the Last Year: Never true  Transportation Needs: No Transportation Needs (11/08/2022)   PRAPARE - Administrator, Civil Service (Medical): No    Lack of Transportation (Non-Medical): No  Physical Activity: Insufficiently Active (11/08/2022)   Exercise Vital Sign    Days of Exercise per Week: 2 days    Minutes of Exercise per Session: 60 min  Stress: No Stress Concern Present (11/08/2022)   [employer] of Occupational Health - Occupational Stress Questionnaire    Feeling of Stress : Only a little  Social Connections: Moderately Integrated (11/08/2022)   Social Connection and Isolation Panel [NHANES]    Frequency of Communication with Friends and Family: More than three times a week    Frequency of Social Gatherings with Friends and Family: More than three times a week    Attends Religious  Services: More than 4 times per year    Active Member of Clubs or Organizations: Yes    Attends Banker Meetings: More than 4 times per year    Marital Status: Widowed    Review of Systems Per HPI  Objective:  BP (!) 152/80   Pulse 68   Temp 97.8 F (36.6 C) (Oral)   Ht 5\' 6"  (1.676 m)   Wt 140 lb 12.8 oz (63.9 kg)   SpO2 98%   BMI 22.73 kg/m      02/07/2023   10:23 AM 02/07/2023   10:04 AM 01/17/2023    3:12 PM  BP/Weight  Systolic BP 152 186 145  Diastolic BP 80 78 68  Wt. (Lbs)  140.8   BMI  22.73 kg/m2     Physical Exam Vitals and nursing note reviewed.   Constitutional:      General: She is not in acute distress.    Appearance: Normal appearance.  HENT:     Head: Normocephalic and atraumatic.  Eyes:     General:        Right eye: No discharge.        Left eye: No discharge.     Conjunctiva/sclera: Conjunctivae normal.  Cardiovascular:     Rate and Rhythm: Normal rate and regular rhythm.  Pulmonary:     Effort: Pulmonary effort is normal.     Breath sounds: Normal breath sounds. No wheezing or rales.  Neurological:     Mental Status: She is alert.     Lab Results  Component Value Date   WBC 10.1 11/15/2022   HGB 11.6 (L) 11/15/2022   HCT 35.5 (L) 11/15/2022   PLT 231 11/15/2022   GLUCOSE 279 (H) 11/15/2022   CHOL 147 10/30/2022   TRIG 104 10/30/2022   HDL 47 10/30/2022   LDLCALC 81 10/30/2022   ALT 17 11/15/2022   AST 23 11/15/2022   NA 136 11/15/2022   K 3.8 11/15/2022   CL 104 11/15/2022   CREATININE 1.60 (H) 11/15/2022   BUN 34 (H) 11/15/2022   CO2 22 11/15/2022   HGBA1C 6.6 (H) 10/30/2022     Assessment & Plan:   Problem List Items Addressed This Visit       Cardiovascular and Mediastinum   Essential hypertension, benign - Primary    Stopping diltiazem.  Starting valsartan.  Follow-up in 2 weeks.      Relevant Medications   valsartan (DIOVAN) 80 MG tablet     Other   Neck pain    MSK in nature.  Advised heat and baclofen.      Cough    Postinfectious cough.  Tessalon Perles as prescribed.       Meds ordered this encounter  Medications   valsartan (DIOVAN) 80 MG tablet    Sig: Take 1 tablet (80 mg total) by mouth daily.    Dispense:  90 tablet    Refill:  3   benzonatate (TESSALON) 200 MG capsule    Sig: Take 1 capsule (200 mg total) by mouth 3 (three) times daily as needed for cough.    Dispense:  30 capsule    Refill:  0   baclofen (LIORESAL) 10 MG tablet    Sig: Take 0.5 tablets (5 mg total) by mouth 3 (three) times daily as needed for muscle spasms.    Dispense:  30 each     Refill:  0    Follow-up:  Return in about 2 weeks (around 02/21/2023).  Everlene Other DO [facility]

## 2023-02-09 NOTE — Assessment & Plan Note (Signed)
Postinfectious cough.  Tessalon Perles as prescribed.

## 2023-02-09 NOTE — Assessment & Plan Note (Signed)
MSK in nature.  Advised heat and baclofen.

## 2023-02-10 ENCOUNTER — Other Ambulatory Visit: Payer: Self-pay | Admitting: Family Medicine

## 2023-02-12 ENCOUNTER — Other Ambulatory Visit: Payer: Self-pay

## 2023-02-12 DIAGNOSIS — C50412 Malignant neoplasm of upper-outer quadrant of left female breast: Secondary | ICD-10-CM

## 2023-02-12 MED ORDER — TAMOXIFEN CITRATE 10 MG PO TABS
10.0000 mg | ORAL_TABLET | Freq: Every day | ORAL | 3 refills | Status: DC
Start: 2023-02-12 — End: 2023-09-09

## 2023-02-12 NOTE — Telephone Encounter (Signed)
Orders placed for Tamoxifen per Dr. Marice Potter last note.

## 2023-02-14 ENCOUNTER — Telehealth: Payer: Self-pay

## 2023-02-14 NOTE — Telephone Encounter (Unsigned)
Copied from CRM [id number]. Topic: General - Other >> Feb 14, 2023 11:15 AM Larwance Sachs wrote: Reason for CRM: Boykin Nearing called in from kidney care doctor office to inform Dr. Adriana Simas that the patient has not been seen since June and the office has not been able to reach patient since August, Bari stated they are no longer going to continue to attempt to contact patient and if care is needed to be continued a new contract would need to be sent over

## 2023-02-21 ENCOUNTER — Ambulatory Visit (INDEPENDENT_AMBULATORY_CARE_PROVIDER_SITE_OTHER): Payer: Medicare Other | Admitting: Family Medicine

## 2023-02-21 DIAGNOSIS — I10 Essential (primary) hypertension: Secondary | ICD-10-CM | POA: Diagnosis not present

## 2023-02-21 DIAGNOSIS — E1122 Type 2 diabetes mellitus with diabetic chronic kidney disease: Secondary | ICD-10-CM

## 2023-02-21 DIAGNOSIS — N1832 Chronic kidney disease, stage 3b: Secondary | ICD-10-CM | POA: Diagnosis not present

## 2023-02-21 NOTE — Patient Instructions (Signed)
One lab draw today.  Follow up in 3 months.

## 2023-02-21 NOTE — Progress Notes (Signed)
Subjective:  Patient ID: Stacy Webb, female    DOB: [birth year]  Age: 77 y.o. MRN: [id number]  CC: Follow-up hypertension   HPI:  77 year old female presents for follow-up regarding hypertension.  Medications recently adjusted.  She is now on valsartan.  Blood pressure fairly well-controlled here today.  Needs follow-up BMP.  Patient also request her A1c to be drawn.  No chest pain or shortness of breath.  She is feeling well.  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)    Breast cancer of upper-outer quadrant of left female breast (HCC) 11/12/2021   Cholelithiasis 10/29/2020   Rotator cuff arthropathy of left shoulder 07/10/2020   CKD (chronic kidney disease) stage 3, GFR 30-59 ml/min (HCC) 09/27/2016   Hyperlipidemia 09/27/2016   Essential hypertension, benign 08/11/2012    Social Hx   Social History   Socioeconomic History   Marital status: Widowed    Spouse name: Not on file   Number of children: 3   Years of education: Not on file   Highest education level: Not on file  Occupational History   Occupation: [employer]    Comment: Medical Records  Tobacco Use   Smoking status: Never   Smokeless tobacco: Never  Vaping Use   Vaping status: Never Used  Substance and Sexual Activity   Alcohol use: No   Drug use: No   Sexual activity: Not Currently    Birth control/protection: Post-menopausal  Other Topics Concern   Not on file  Social History Narrative   Husband had ALS and passed away.Twin boys, but one died at 47 months and her other son died in 03-19-2015.      Living daughter- lives with pt.   Social Determinants of Health   Financial Resource Strain: Low Risk  (11/08/2022)   Overall Financial Resource  Strain (CARDIA)    Difficulty of Paying Living Expenses: Not hard at all  Food Insecurity: No Food Insecurity (11/08/2022)   Hunger Vital Sign    Worried About Running Out of Food in the Last Year: Never true    Ran Out of Food in the Last Year: Never true  Transportation Needs: No Transportation Needs (11/08/2022)   PRAPARE - Administrator, Civil Service (Medical): No    Lack of Transportation (Non-Medical): No  Physical Activity: Insufficiently Active (11/08/2022)   Exercise Vital Sign    Days of Exercise per Week: 2 days    Minutes of Exercise per Session: 60 min  Stress: No Stress Concern Present (11/08/2022)   [employer] of Occupational Health - Occupational Stress Questionnaire    Feeling of Stress : Only a little  Social Connections: Moderately Integrated (11/08/2022)   Social Connection and Isolation Panel [NHANES]    Frequency of Communication with Friends and Family: More than three times a week    Frequency of Social Gatherings with Friends and Family: More than three times a week    Attends Religious Services: More than 4 times per year    Active Member of [employer] or Organizations: Yes    Attends Banker Meetings: More than 4 times per year    Marital Status: Widowed    Review of Systems Per HPI  Objective:  BP 135/72  Pulse 91   Temp 97.6 F (36.4 C)   Ht 5\' 6"  (1.676 m)   Wt 143 lb 3.2 oz (65 kg)   SpO2 99%   BMI 23.11 kg/m      02/21/2023    9:58 AM 02/21/2023    9:41 AM 02/07/2023   10:23 AM  BP/Weight  Systolic BP 135 174 152  Diastolic BP 72 72 80  Wt. (Lbs)  143.2   BMI  23.11 kg/m2     Physical Exam Vitals and nursing note reviewed.  Constitutional:      General: She is not in acute distress.    Appearance: Normal appearance.  Cardiovascular:     Rate and Rhythm: Normal rate and regular rhythm.  Pulmonary:     Effort: Pulmonary effort is normal.     Breath sounds: Normal breath sounds.  Neurological:     Mental  Status: She is alert.  Psychiatric:        Mood and Affect: Mood normal.        Behavior: Behavior normal.     Lab Results  Component Value Date   WBC 10.1 11/15/2022   HGB 11.6 (L) 11/15/2022   HCT 35.5 (L) 11/15/2022   PLT 231 11/15/2022   GLUCOSE 279 (H) 11/15/2022   CHOL 147 10/30/2022   TRIG 104 10/30/2022   HDL 47 10/30/2022   LDLCALC 81 10/30/2022   ALT 17 11/15/2022   AST 23 11/15/2022   NA 136 11/15/2022   K 3.8 11/15/2022   CL 104 11/15/2022   CREATININE 1.60 (H) 11/15/2022   BUN 34 (H) 11/15/2022   CO2 22 11/15/2022   HGBA1C 6.6 (H) 10/30/2022     Assessment & Plan:   Problem List Items Addressed This Visit       Cardiovascular and Mediastinum   Essential hypertension, benign    Improved.  Continue valsartan.  Metabolic panel today to assess renal function.      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  A1c today.      Relevant Orders   Hemoglobin A1c   Follow-up:  Return in about 3 months (around 05/24/2023).  Everlene Other DO [facility]

## 2023-02-21 NOTE — Assessment & Plan Note (Signed)
Improved.  Continue valsartan.  Metabolic panel today to assess renal function.

## 2023-02-21 NOTE — Assessment & Plan Note (Signed)
Well-controlled.  A1c today.

## 2023-02-22 LAB — BASIC METABOLIC PANEL
BUN/Creatinine Ratio: 18 (ref 12–28)
BUN: 25 mg/dL (ref 8–27)
CO2: 24 mmol/L (ref 20–29)
Calcium: 9.1 mg/dL (ref 8.7–10.3)
Chloride: 106 mmol/L (ref 96–106)
Creatinine, Ser: 1.42 mg/dL — ABNORMAL HIGH (ref 0.57–1.00)
Glucose: 93 mg/dL (ref 70–99)
Potassium: 4.2 mmol/L (ref 3.5–5.2)
Sodium: 143 mmol/L (ref 134–144)
eGFR: 38 mL/min/{1.73_m2} — ABNORMAL LOW (ref >59)

## 2023-02-22 LAB — HEMOGLOBIN A1C
Est. average glucose Bld gHb Est-mCnc: 151 mg/dL
Hgb A1c MFr Bld: 6.9 % — ABNORMAL HIGH (ref 4.8–5.6)

## 2023-03-26 DIAGNOSIS — M14672 Charcot's joint, left ankle and foot: Secondary | ICD-10-CM | POA: Diagnosis not present

## 2023-03-26 DIAGNOSIS — M14679 Charcot's joint, unspecified ankle and foot: Secondary | ICD-10-CM | POA: Insufficient documentation

## 2023-03-26 DIAGNOSIS — E1140 Type 2 diabetes mellitus with diabetic neuropathy, unspecified: Secondary | ICD-10-CM | POA: Diagnosis not present

## 2023-03-27 ENCOUNTER — Ambulatory Visit: Payer: Medicare Other | Attending: Obstetrics & Gynecology

## 2023-03-27 DIAGNOSIS — R278 Other lack of coordination: Secondary | ICD-10-CM | POA: Diagnosis not present

## 2023-03-27 DIAGNOSIS — R293 Abnormal posture: Secondary | ICD-10-CM | POA: Insufficient documentation

## 2023-03-27 DIAGNOSIS — R279 Unspecified lack of coordination: Secondary | ICD-10-CM | POA: Insufficient documentation

## 2023-03-27 DIAGNOSIS — R269 Unspecified abnormalities of gait and mobility: Secondary | ICD-10-CM | POA: Diagnosis not present

## 2023-03-27 DIAGNOSIS — M6281 Muscle weakness (generalized): Secondary | ICD-10-CM | POA: Insufficient documentation

## 2023-03-27 NOTE — Therapy (Signed)
OUTPATIENT PHYSICAL THERAPY FEMALE PELVIC EVALUATION   Patient Name: Stacy Webb MRN: [id number] DOB:1946-02-23, 77 y.o., female Today's Date: 03/27/2023  END OF SESSION:  PT End of Session - 03/27/23 0930     Visit Number 4    Date for PT Re-Evaluation 05/22/23    Authorization Type [employer] Medicare    Authorization Time Period 03/11/2023 to 05/06/2023    Authorization - Visit Number 1    Authorization - Number of Visits 4    Progress Note Due on Visit 10    PT Start Time 0930    PT Stop Time 1010    PT Time Calculation (min) 40 min    Activity Tolerance Patient tolerated treatment well    Behavior During Therapy [facility] for tasks assessed/performed               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)    Diabetes mellitus without complication (HCC)    Diabetic foot ulcer (HCC) 09/27/2016   Excessive hair growth 07/19/2015   Heart murmur    History of kidney stones    Hyperlipidemia    Hypertension    Neuromuscular disorder (HCC)    diabetic neuropathy   Neuropathy    Postmenopausal 07/19/2015   Past Surgical History:  Procedure Laterality Date   ANTERIOR AND POSTERIOR REPAIR N/A 04/28/2018   Procedure: ANTERIOR (CYSTOCELE) AND POSTERIOR REPAIR (RECTOCELE);  Surgeon: Tilda Burrow, MD;  Location: [facility] ORS;  Service: Gynecology;  Laterality: N/A;   APPENDECTOMY     BREAST BIOPSY Left 2023   invasive ca   BREAST LUMPECTOMY Left 2023   invasive ca   CARPAL TUNNEL RELEASE Bilateral 2001   CESAREAN SECTION     COLONOSCOPY N/A 08/28/2012   Procedure: COLONOSCOPY;  Surgeon: Malissa Hippo, MD;  Location: [facility];  Service: Endoscopy;  Laterality: N/A;  915   COLONOSCOPY N/A 12/25/2017   Procedure: COLONOSCOPY;  Surgeon: Malissa Hippo, MD;  Location: [facility];  Service: Endoscopy;  Laterality: N/A;  12:45   REVERSE SHOULDER ARTHROPLASTY Left 07/10/2020    Procedure: REVERSE SHOULDER ARTHROPLASTY;  Surgeon: Oliver Barre, MD;  Location: [facility];  Service: Orthopedics;  Laterality: Left;   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)    Breast cancer of upper-outer quadrant of left female breast (HCC) 11/12/2021   Cholelithiasis 10/29/2020   Rotator cuff arthropathy of left shoulder 07/10/2020   CKD (chronic kidney disease) stage 3, GFR 30-59 ml/min (HCC) 09/27/2016   Hyperlipidemia 09/27/2016   Essential hypertension, benign 08/11/2012    PCP: [name], DO  REFERRING PROVIDER: Myna Hidalgo, DO  REFERRING DIAG: R15.9,K59.00 (ICD-10-CM) - Fecal incontinence alternating with constipation N39.46 (ICD-10-CM) - Mixed stress and urge urinary incontinence  THERAPY DIAG:  Abnormal posture - Plan: PT plan of care cert/re-cert  Muscle weakness (generalized) - Plan: PT plan of care cert/re-cert  Unspecified lack of coordination - Plan: PT plan of care cert/re-cert  Abnormality of gait and mobility - Plan: PT plan of care cert/re-cert  Other lack of coordination - Plan: PT plan of  care cert/re-cert  Lack of coordination - Plan: PT plan of care cert/re-cert  Rationale for Evaluation and Treatment: Rehabilitation  ONSET DATE: April 2024  SUBJECTIVE:                                                                                                                                                                                           SUBJECTIVE STATEMENT: Pt states that she was up 4x last night to go to the bathroom. However, when she cuts off fluids early in the night, she does better. She is having more regular bowel movements. She feels like fecal incontinence is better,  just occasionally having leakage.   PAIN:  Are you having pain? No   PRECAUTIONS: Other: Breast cancer dx a year ago - no radiation/chemo   RED FLAGS: None   WEIGHT BEARING RESTRICTIONS: No  FALLS:  Has patient fallen in last 6 months? Yes. Number of falls 2x  LIVING ENVIRONMENT: Lives with: lives alone Lives in: House/apartment   OCCUPATION: retired   PLOF: Independent  PATIENT GOALS: get control over bowel movement and have one without straining so hard   PERTINENT HISTORY:  Anterior/posterior vaginal wall repair; breast cancer 2023/lumpectomy, appendectomy, c-section x2,   BOWEL MOVEMENT: Pain with bowel movement: No Type of bowel movement:Type (Bristol Stool Scale) 1, Frequency 2-3 days , and Strain Yes Fully empty rectum: No - she is not using squatty potty  Leakage: Yes: very occasionally she will find ball of stool in pad when she goes to urinate Pads: Yes: 1-2/day Fiber supplement: No  URINATION: Pain with urination: No Fully empty bladder: Yes: but then she'll have to come back once she gets up Stream: Strong Urgency: Yes: at night  Frequency: every 3 hours  Leakage: Coughing and at night not getting up in time; very rarely Pads: Yes: 1-2/day  INTERCOURSE: Not sexually active, no history of pain   PREGNANCY: Vaginal deliveries 0 C-section deliveries 3   PROLAPSE: Ball in vagina   OBJECTIVE:  Note: Objective measures were completed at Evaluation unless otherwise noted. 03/27/23: Good transversus abdominus contraction with breath coordination Better pressure management with bed mobility Improved balance with mobilization around treatment room  01/13/23: COGNITION: Overall cognitive status: Within functional limits for tasks assessed     SENSATION: Light touch: Appears intact Proprioception: Appears intact  FUNCTIONAL TESTS:  LOB with mobility around treatment room; prefers to have hha on wall, table, or other equipment Unable to stand  on Rt LE due to charcot deformity 2 second single leg stance on Lt LE with extreme compensation with weight shift over Lt LE  GAIT: Comments: Lt compensated  trendelenburg, Rt antalgic gait pattern   POSTURE: rounded shoulders, forward head, decreased lumbar lordosis, increased thoracic kyphosis, posterior pelvic tilt, and Rt thoracic curvature  LUMBARAROM/PROM:  A/PROM A/PROM  Eval (% available)  Flexion 80  Extension 75  Right lateral flexion 50  Left lateral flexion 50  Right rotation 50  Left rotation 50   (Blank rows = not tested)   PALPATION:   General  tenderness in Lt lower quadrant                External Perineal Exam redness/fragile looking tissue                             Internal Pelvic Floor tissue tenderness throughout; very sensitive to any movement  Patient confirms identification and approves PT to assess internal pelvic floor and treatment Yes  PELVIC MMT:   MMT eval  Vaginal 1/5, poor coordination of active contraction vs bearing down and feeling when she is doing each one; 3 second hold; 3 repeat contractions  Internal Anal Sphincter 0-1/5  External Anal Sphincter 0-1/5  Puborectalis 0-1/5  Diastasis Recti 4 finger width separation/distortion with increased pressure   (Blank rows = not tested)        TONE: low  PROLAPSE: Grade 2 anterior vaginal wall laxity  TODAY'S TREATMENT:                                                                                                                              DATE:  03/27/23 Neuromuscular re-education: Bridge with ball squeeze, transversus abdominus, and pelvic floor muscle 2 x 10 Seated hip abduction red band 2 x 10 with pelvic floor muscle and transversus abdominus  Seated march red band 2 x 10 with pelvic floor muscle and transversus abdominus Exercises: Lower trunk rotation 2 x 10 Single knee to chest 10x bil Bent knee fall out 10x bil Clam shell 2 x 10 bil Reverse clam shell 2 x 10  bil  01/27/23 Neuromuscular re-education: Bridge with ball squeeze, transversus abdominus, and pelvic floor muscle 2 x 10 Seated hip abduction red band 2 x 10 with pelvic floor muscle and transversus abdominus  Seated march red band 2 x 10 with pelvic floor muscle and transversus abdominus Exercises: Lower trunk rotation 2 x 10 Single knee to chest 10x bil Bent knee fall out 10x bil Clam shell 2 x 10 bil Reverse clam shell 2 x 10 bil   01/20/23 Manual: Bowel mobilization Neuromuscular re-education: Pelvic floor contractions in seated position with verbal cues  Quick flicks Long holds Seated hip adduction with pelvic floor/core contraction 2 x 10 Seated hip abduction with pelvic floor/core contraction 2 x 10   PATIENT EDUCATION:  Education details: See above Person educated: Patient Education method: Explanation, Demonstration, Tactile cues, Verbal cues, and Handouts Education comprehension: verbalized understanding  HOME EXERCISE PROGRAM: [id number]  ASSESSMENT:  CLINICAL IMPRESSION: Pt doing very  well overall, reporting improvement in frequency and complete evacuation of bowel movements and rarely having fecal incontinence. She is still struggling with urinary urgency and nocturia. We discussed timing of fluids at night to help with this and she is agreeable. We reviewed the urge drill to help with urinary urgency. Due to it being 2 months since last appointment and infrequent performance of HEP, we reviewed HEP with strengthening and mobility exercises. She will continue to benefit from skilled PT intervention in order to improve ability to have bowel movement without straining, decrease fecal incontinence, decrease stress urinary incontinence, and begin/progress functional strengthening program.    OBJECTIVE IMPAIRMENTS: decreased activity tolerance, decreased coordination, decreased endurance, decreased strength, increased fascial restrictions, increased muscle spasms,  impaired tone, postural dysfunction, and pain.   ACTIVITY LIMITATIONS: continence  PARTICIPATION LIMITATIONS: community activity  PERSONAL FACTORS: 1 comorbidity: medical history  are also affecting patient's functional outcome.   REHAB POTENTIAL: Good  CLINICAL DECISION MAKING: Stable/uncomplicated  EVALUATION COMPLEXITY: Low   GOALS: Goals reviewed with patient? Yes  SHORT TERM GOALS: Target date: 02/10/23 - updated 03/27/23  Pt will be independent with HEP.   Baseline: Goal status: MET 03/27/23  2.  Pt will be able to consistently perform pelvic floor muscle contraction with full A/ROM and appropriate breath coordination.  Baseline:  Goal status: MET 03/27/23  3.  Pt will be independent with double voiding in order to completely empty bladder. Baseline:  Goal status: MET 03/27/23  4.  Pt will be independent with use of squatty potty, relaxed toileting mechanics, and improved bowel movement techniques in order to increase ease of bowel movements and complete evacuation.   Baseline:  Goal status: MET 03/27/23    LONG TERM GOALS: Target date: 03/10/2023 - updated 03/27/23  Pt will be independent with advanced HEP.   Baseline:  Goal status: IN PROGRESS 03/27/23  2.  Pt will demonstrate increased pelvic floor muscle and external anal sphincter strength to consistently 3/5 in order to decrease episodes of fecal incontinence.   Baseline:  Goal status: IN PROGRESS 03/27/23  3.  Pt will report no episodes of urinary or fecal incontinence in order to improve confidence in community activities and personal hygiene.   Baseline:  Goal status: IN PROGRESS 03/27/23  4.  Pt will be able to have bowel movement every other day without straining and sensation of complete evacuation.  Baseline:  Goal status: IN PROGRESS 03/27/23  PLAN:  PT FREQUENCY: 1-2x/week  PT DURATION: 8 weeks  PLANNED INTERVENTIONS: Therapeutic exercises, Therapeutic activity, Neuromuscular  re-education, Balance training, Gait training, Patient/Family education, Self Care, Joint mobilization, Dry Needling, Biofeedback, and Manual therapy  PLAN FOR NEXT SESSION: Core training/progressions; bowel massage; inverted lying.    Julio Alm, PT, DPT12/19/2410:20 AM

## 2023-04-03 ENCOUNTER — Ambulatory Visit: Payer: Medicare Other

## 2023-04-03 DIAGNOSIS — R278 Other lack of coordination: Secondary | ICD-10-CM

## 2023-04-03 DIAGNOSIS — R279 Unspecified lack of coordination: Secondary | ICD-10-CM

## 2023-04-03 DIAGNOSIS — M6281 Muscle weakness (generalized): Secondary | ICD-10-CM | POA: Diagnosis not present

## 2023-04-03 DIAGNOSIS — R293 Abnormal posture: Secondary | ICD-10-CM | POA: Diagnosis not present

## 2023-04-03 DIAGNOSIS — R269 Unspecified abnormalities of gait and mobility: Secondary | ICD-10-CM | POA: Diagnosis not present

## 2023-04-03 NOTE — Therapy (Signed)
OUTPATIENT PHYSICAL THERAPY FEMALE PELVIC EVALUATION   Patient Name: Stacy Webb MRN: [id number] DOB:Aug 26, 1945, 77 y.o., female Today's Date: 04/03/2023  END OF SESSION:  PT End of Session - 04/03/23 1224     Visit Number 5    Date for PT Re-Evaluation 05/22/23    Authorization Type [employer] Medicare    Authorization Time Period 03/11/2023 to 05/06/2023    Authorization - Visit Number 2    Authorization - Number of Visits 4    Progress Note Due on Visit 10    PT Start Time 1230    PT Stop Time 1310    PT Time Calculation (min) 40 min    Activity Tolerance Patient tolerated treatment well    Behavior During Therapy [facility] for tasks assessed/performed               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)    Diabetes mellitus without complication (HCC)    Diabetic foot ulcer (HCC) 09/27/2016   Excessive hair growth 07/19/2015   Heart murmur    History of kidney stones    Hyperlipidemia    Hypertension    Neuromuscular disorder (HCC)    diabetic neuropathy   Neuropathy    Postmenopausal 07/19/2015   Past Surgical History:  Procedure Laterality Date   ANTERIOR AND POSTERIOR REPAIR N/A 04/28/2018   Procedure: ANTERIOR (CYSTOCELE) AND POSTERIOR REPAIR (RECTOCELE);  Surgeon: Tilda Burrow, MD;  Location: [facility];  Service: Gynecology;  Laterality: N/A;   APPENDECTOMY     BREAST BIOPSY Left 2023   invasive ca   BREAST LUMPECTOMY Left 2023   invasive ca   CARPAL TUNNEL RELEASE Bilateral 2001   CESAREAN SECTION     COLONOSCOPY N/A 08/28/2012   Procedure: COLONOSCOPY;  Surgeon: Malissa Hippo, MD;  Location: [facility];  Service: Endoscopy;  Laterality: N/A;  915   COLONOSCOPY N/A 12/25/2017   Procedure: COLONOSCOPY;  Surgeon: Malissa Hippo, MD;  Location: [facility];  Service: Endoscopy;  Laterality: N/A;  12:45   REVERSE SHOULDER ARTHROPLASTY Left 07/10/2020    Procedure: REVERSE SHOULDER ARTHROPLASTY;  Surgeon: Oliver Barre, MD;  Location: [facility];  Service: Orthopedics;  Laterality: Left;   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)    Breast cancer of upper-outer quadrant of left female breast (HCC) 11/12/2021   Cholelithiasis 10/29/2020   Rotator cuff arthropathy of left shoulder 07/10/2020   CKD (chronic kidney disease) stage 3, GFR 30-59 ml/min (HCC) 09/27/2016   Hyperlipidemia 09/27/2016   Essential hypertension, benign 08/11/2012    PCP: [name], DO  REFERRING PROVIDER: Myna Hidalgo, DO  REFERRING DIAG: R15.9,K59.00 (ICD-10-CM) - Fecal incontinence alternating with constipation N39.46 (ICD-10-CM) - Mixed stress and urge urinary incontinence  THERAPY DIAG:  Abnormal posture  Muscle weakness (generalized)  Unspecified lack of coordination  Other lack of coordination  Lack of coordination  Rationale for Evaluation and Treatment: Rehabilitation  ONSET DATE: April 2024  SUBJECTIVE:  SUBJECTIVE STATEMENT: Pt states that she has had a difficult time getting in exercises this week with the holiday.  PAIN:  Are you having pain? No   PRECAUTIONS: Other: Breast cancer dx a year ago - no radiation/chemo   RED FLAGS: None   WEIGHT BEARING RESTRICTIONS: No  FALLS:  Has patient fallen in last 6 months? Yes. Number of falls 2x  LIVING ENVIRONMENT: Lives with: lives alone Lives in: House/apartment   OCCUPATION: retired   PLOF: Independent  PATIENT GOALS: get  control over bowel movement and have one without straining so hard   PERTINENT HISTORY:  Anterior/posterior vaginal wall repair; breast cancer 2023/lumpectomy, appendectomy, c-section x2,   BOWEL MOVEMENT: Pain with bowel movement: No Type of bowel movement:Type (Bristol Stool Scale) 1, Frequency 2-3 days , and Strain Yes Fully empty rectum: No - she is not using squatty potty  Leakage: Yes: very occasionally she will find ball of stool in pad when she goes to urinate Pads: Yes: 1-2/day Fiber supplement: No  URINATION: Pain with urination: No Fully empty bladder: Yes: but then she'll have to come back once she gets up Stream: Strong Urgency: Yes: at night  Frequency: every 3 hours  Leakage: Coughing and at night not getting up in time; very rarely Pads: Yes: 1-2/day  INTERCOURSE: Not sexually active, no history of pain   PREGNANCY: Vaginal deliveries 0 C-section deliveries 3   PROLAPSE: Ball in vagina   OBJECTIVE:  Note: Objective measures were completed at Evaluation unless otherwise noted. 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 2 second single leg stance on Lt LE with extreme compensation with weight shift over Lt LE  GAIT: Comments: Lt compensated trendelenburg, Rt antalgic gait pattern   POSTURE: rounded shoulders, forward head, decreased lumbar lordosis, increased thoracic kyphosis, posterior pelvic tilt, and Rt thoracic curvature  LUMBARAROM/PROM:  A/PROM A/PROM  Eval (% available)   Flexion 80  Extension 75  Right lateral flexion 50  Left lateral flexion 50  Right rotation 50  Left rotation 50   (Blank rows = not tested)   PALPATION:   General  tenderness in Lt lower quadrant                External Perineal Exam redness/fragile looking tissue                             Internal Pelvic Floor tissue tenderness throughout; very sensitive to any movement  Patient confirms identification and approves PT to assess internal pelvic floor and treatment Yes  PELVIC MMT:   MMT eval  Vaginal 1/5, poor coordination of active contraction vs bearing down and feeling when she is doing each one; 3 second hold; 3 repeat contractions  Internal Anal Sphincter 0-1/5  External Anal Sphincter 0-1/5  Puborectalis 0-1/5  Diastasis Recti 4 finger width separation/distortion with increased pressure   (Blank rows = not tested)        TONE: low  PROLAPSE: Grade 2 anterior vaginal wall laxity  TODAY'S TREATMENT:  DATE:  04/03/23 Neuromuscular re-education: Horizontal shoulder abduction side lying 10x 2lb weight with transversus abdominus and pelvic floor muscle  Seated horizontal shoulder abduction/extension 2 x 10 red band  Seated pelvic tilts 2 x 10 Exercises: Clam shell 2 x 10 bil Reverse clam shell 2 x 10 bil Side lying hip abduction 10x bil Standing hip abduction 2 x 10 bil at table Standing hip extension 2 x 10 bil at table Sidestepping without resistance at table 5 laps  Seated hamstring stretch 60 sec bil  03/27/23 Neuromuscular re-education: Bridge with ball squeeze, transversus abdominus, and pelvic floor muscle 2 x 10 Seated hip abduction red band 2 x 10 with pelvic floor muscle and transversus abdominus  Seated march red band 2 x 10 with pelvic floor muscle and transversus abdominus Exercises: Lower trunk rotation 2 x 10 Single  knee to chest 10x bil Bent knee fall out 10x bil Clam shell 2 x 10 bil Reverse clam shell 2 x 10 bil  01/27/23 Neuromuscular re-education: Bridge with ball squeeze, transversus abdominus, and pelvic floor muscle 2 x 10 Seated hip abduction red band 2 x 10 with pelvic floor muscle and transversus abdominus  Seated march red band 2 x 10 with pelvic floor muscle and transversus abdominus Exercises: Lower trunk rotation 2 x 10 Single knee to chest 10x bil Bent knee fall out 10x bil Clam shell 2 x 10 bil Reverse clam shell 2 x 10 bil   PATIENT EDUCATION:  Education details: See above Person educated: Patient Education method: Explanation, Demonstration, Tactile cues, Verbal cues, and Handouts Education comprehension: verbalized understanding  HOME EXERCISE PROGRAM: [id number]  ASSESSMENT:  CLINICAL IMPRESSION: PT continues to do well, but not much change over the last week. She did well with review of previous progressions and addition of more challenging UE/LE activities. She had some difficulty with shoulder activities on Rt side due to rotator cuff injury. She was able to perform standing exercises with good tolerance as long as she had table for UE support. She will continue to benefit from skilled PT intervention in order to improve ability to have bowel movement without straining, decrease fecal incontinence, decrease stress urinary incontinence, and begin/progress functional strengthening program.    OBJECTIVE IMPAIRMENTS: decreased activity tolerance, decreased coordination, decreased endurance, decreased strength, increased fascial restrictions, increased muscle spasms, impaired tone, postural dysfunction, and pain.   ACTIVITY LIMITATIONS: continence  PARTICIPATION LIMITATIONS: community activity  PERSONAL FACTORS: 1 comorbidity: medical history  are also affecting patient's functional outcome.   REHAB POTENTIAL: Good  CLINICAL DECISION MAKING:  Stable/uncomplicated  EVALUATION COMPLEXITY: Low   GOALS: Goals reviewed with patient? Yes  SHORT TERM GOALS: Target date: 02/10/23 - updated 03/27/23  Pt will be independent with HEP.   Baseline: Goal status: MET 03/27/23  2.  Pt will be able to consistently perform pelvic floor muscle contraction with full A/ROM and appropriate breath coordination.  Baseline:  Goal status: MET 03/27/23  3.  Pt will be independent with double voiding in order to completely empty bladder. Baseline:  Goal status: MET 03/27/23  4.  Pt will be independent with use of squatty potty, relaxed toileting mechanics, and improved bowel movement techniques in order to increase ease of bowel movements and complete evacuation.   Baseline:  Goal status: MET 03/27/23    LONG TERM GOALS: Target date: 03/10/2023 - updated 03/27/23  Pt will be independent with advanced HEP.   Baseline:  Goal status: IN PROGRESS 03/27/23  2.  Pt will demonstrate increased  pelvic floor muscle and external anal sphincter strength to consistently 3/5 in order to decrease episodes of fecal incontinence.   Baseline:  Goal status: IN PROGRESS 03/27/23  3.  Pt will report no episodes of urinary or fecal incontinence in order to improve confidence in community activities and personal hygiene.   Baseline:  Goal status: IN PROGRESS 03/27/23  4.  Pt will be able to have bowel movement every other day without straining and sensation of complete evacuation.  Baseline:  Goal status: IN PROGRESS 03/27/23  PLAN:  PT FREQUENCY: 1-2x/week  PT DURATION: 8 weeks  PLANNED INTERVENTIONS: Therapeutic exercises, Therapeutic activity, Neuromuscular re-education, Balance training, Gait training, Patient/Family education, Self Care, Joint mobilization, Dry Needling, Biofeedback, and Manual therapy  PLAN FOR NEXT SESSION: Core training/progressions; bowel massage; inverted lying.    Julio Alm, PT, DPT12/26/241:07 PM

## 2023-04-06 ENCOUNTER — Other Ambulatory Visit: Payer: Self-pay | Admitting: Family Medicine

## 2023-04-10 ENCOUNTER — Ambulatory Visit: Payer: Medicare Other | Attending: Obstetrics & Gynecology

## 2023-04-10 DIAGNOSIS — R278 Other lack of coordination: Secondary | ICD-10-CM

## 2023-04-10 DIAGNOSIS — R269 Unspecified abnormalities of gait and mobility: Secondary | ICD-10-CM

## 2023-04-10 DIAGNOSIS — M6281 Muscle weakness (generalized): Secondary | ICD-10-CM

## 2023-04-10 DIAGNOSIS — R293 Abnormal posture: Secondary | ICD-10-CM

## 2023-04-10 DIAGNOSIS — R279 Unspecified lack of coordination: Secondary | ICD-10-CM | POA: Diagnosis not present

## 2023-04-10 NOTE — Therapy (Signed)
 OUTPATIENT PHYSICAL THERAPY FEMALE PELVIC EVALUATION   Patient Name: Stacy Webb MRN: [id number] DOB:1945-12-01, 78 y.o., female Today's Date: 04/10/2023  END OF SESSION:  PT End of Session - 04/10/23 1443     Visit Number 6    Date for PT Re-Evaluation 05/22/23    Authorization Type [employer] Medicare    Authorization Time Period 03/11/2023 to 05/06/2023    Authorization - Visit Number 3    Authorization - Number of Visits 4    Progress Note Due on Visit 10    PT Start Time 1445    PT Stop Time 1525    PT Time Calculation (min) 40 min    Activity Tolerance Patient tolerated treatment well    Behavior During Therapy [facility] for tasks assessed/performed               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)    Diabetes mellitus without complication (HCC)    Diabetic foot ulcer (HCC) 09/27/2016   Excessive hair growth 07/19/2015   Heart murmur    History of kidney stones    Hyperlipidemia    Hypertension    Neuromuscular disorder (HCC)    diabetic neuropathy   Neuropathy    Postmenopausal 07/19/2015   Past Surgical History:  Procedure Laterality Date   ANTERIOR AND POSTERIOR REPAIR N/A 04/28/2018   Procedure: ANTERIOR (CYSTOCELE) AND POSTERIOR REPAIR (RECTOCELE);  Surgeon: Edsel Norleen GAILS, MD;  Location: [facility];  Service: Gynecology;  Laterality: N/A;   APPENDECTOMY     BREAST BIOPSY Left 2023   invasive ca   BREAST LUMPECTOMY Left 2023   invasive ca   CARPAL TUNNEL RELEASE Bilateral 2001   CESAREAN SECTION     COLONOSCOPY N/A 08/28/2012   Procedure: COLONOSCOPY;  Surgeon: Claudis RAYMOND Rivet, MD;  [facility];  Service: Endoscopy;  Laterality: N/A;  915   COLONOSCOPY N/A 12/25/2017   Procedure: COLONOSCOPY;  Surgeon: Rivet Claudis RAYMOND, MD;  [facility]: [facility];  Service: Endoscopy;  Laterality: N/A;  12:45   REVERSE SHOULDER ARTHROPLASTY Left 07/10/2020   Procedure:  REVERSE SHOULDER ARTHROPLASTY;  Surgeon: Onesimo Oneil LABOR, MD;  Location: [facility];  Service: Orthopedics;  Laterality: Left;   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)    Breast cancer of upper-outer quadrant of left female breast (HCC) 11/12/2021   Cholelithiasis 10/29/2020   Rotator cuff arthropathy of left shoulder 07/10/2020   CKD (chronic kidney disease) stage 3, GFR 30-59 ml/min (HCC) 09/27/2016   Hyperlipidemia 09/27/2016   Essential hypertension, benign 08/11/2012    PCP: Cook, [name], DO  REFERRING PROVIDER: Ozan, Jennifer, DO  REFERRING DIAG: R15.9,K59.00 (ICD-10-CM) - Fecal incontinence alternating with constipation N39.46 (ICD-10-CM) - Mixed stress and urge urinary incontinence  THERAPY DIAG:  Abnormal posture  Muscle weakness (generalized)  Unspecified lack of coordination  Other lack of coordination  Lack of coordination  Abnormality of gait and mobility  Rationale for Evaluation and Treatment: Rehabilitation  ONSET DATE: April 2024  SUBJECTIVE:  SUBJECTIVE STATEMENT: Pt states that she did do exercises some while she was at the beach.   PAIN:  Are you having pain? No   PRECAUTIONS: Other: Breast cancer dx a year ago - no radiation/chemo   RED FLAGS: None   WEIGHT BEARING RESTRICTIONS: No  FALLS:  Has patient fallen in last 6 months? Yes. Number of falls 2x  LIVING ENVIRONMENT: Lives with: lives alone Lives in: House/apartment   OCCUPATION: retired   PLOF: Independent  PATIENT GOALS: get  control over bowel movement and have one without straining so hard   PERTINENT HISTORY:  Anterior/posterior vaginal wall repair; breast cancer 2023/lumpectomy, appendectomy, c-section x2,   BOWEL MOVEMENT: Pain with bowel movement: No Type of bowel movement:Type (Bristol Stool Scale) 1, Frequency 2-3 days , and Strain Yes Fully empty rectum: No - she is not using squatty potty  Leakage: Yes: very occasionally she will find ball of stool in pad when she goes to urinate Pads: Yes: 1-2/day Fiber supplement: No  URINATION: Pain with urination: No Fully empty bladder: Yes: but then she'll have to come back once she gets up Stream: Strong Urgency: Yes: at night  Frequency: every 3 hours  Leakage: Coughing and at night not getting up in time; very rarely Pads: Yes: 1-2/day  INTERCOURSE: Not sexually active, no history of pain   PREGNANCY: Vaginal deliveries 0 C-section deliveries 3   PROLAPSE: Ball in vagina   OBJECTIVE:  Note: Objective measures were completed at Evaluation unless otherwise noted. 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 2 second single leg stance on Lt LE with extreme compensation with weight shift over Lt LE  GAIT: Comments: Lt compensated trendelenburg, Rt antalgic gait pattern   POSTURE: rounded shoulders, forward head, decreased lumbar lordosis, increased thoracic kyphosis, posterior pelvic tilt, and Rt thoracic curvature  LUMBARAROM/PROM:  A/PROM A/PROM  Eval (% available)   Flexion 80  Extension 75  Right lateral flexion 50  Left lateral flexion 50  Right rotation 50  Left rotation 50   (Blank rows = not tested)   PALPATION:   General  tenderness in Lt lower quadrant                External Perineal Exam redness/fragile looking tissue                             Internal Pelvic Floor tissue tenderness throughout; very sensitive to any movement  Patient confirms identification and approves PT to assess internal pelvic floor and treatment Yes  PELVIC MMT:   MMT eval  Vaginal 1/5, poor coordination of active contraction vs bearing down and feeling when she is doing each one; 3 second hold; 3 repeat contractions  Internal Anal Sphincter 0-1/5  External Anal Sphincter 0-1/5  Puborectalis 0-1/5  Diastasis Recti 4 finger width separation/distortion with increased pressure   (Blank rows = not tested)        TONE: low  PROLAPSE: Grade 2 anterior vaginal wall laxity  TODAY'S TREATMENT:  DATE:  04/10/23 Neuromuscular re-education: Seated pelvic tilts 2 x 10 with multimodal cues to help improve motor control through pelvis Swiss ball activities: Bil circles 2 x 10 bil Lateral glides 2 x 10 Marching 2 x 10 Hip abduction red band 2 x 10 Pelvic tilts Heel raises/toe raises 2 x 10 Standing ball roll outs 10x Supine lower trunk rotation with LE on ball 10x bil Exercises: Seated forward fold 2 x 60 seconds Seated lateral flexion 2 x 60 seconds bil   04/03/23 Neuromuscular re-education: Horizontal shoulder abduction side lying 10x 2lb weight with transversus abdominus and pelvic floor muscle  Seated horizontal shoulder abduction/extension 2 x 10 red band  Seated pelvic tilts 2 x 10 Exercises: Clam shell 2 x 10 bil Reverse clam shell 2 x 10 bil Side lying hip abduction 10x bil Standing hip abduction 2 x 10 bil at  table Standing hip extension 2 x 10 bil at table Sidestepping without resistance at table 5 laps  Seated hamstring stretch 60 sec bil  03/27/23 Neuromuscular re-education: Bridge with ball squeeze, transversus abdominus, and pelvic floor muscle 2 x 10 Seated hip abduction red band 2 x 10 with pelvic floor muscle and transversus abdominus  Seated march red band 2 x 10 with pelvic floor muscle and transversus abdominus Exercises: Lower trunk rotation 2 x 10 Single knee to chest 10x bil Bent knee fall out 10x bil Clam shell 2 x 10 bil Reverse clam shell 2 x 10 bil  PATIENT EDUCATION:  Education details: See above Person educated: Patient Education method: Explanation, Demonstration, Tactile cues, Verbal cues, and Handouts Education comprehension: verbalized understanding  HOME EXERCISE PROGRAM: [id number]  ASSESSMENT:  CLINICAL IMPRESSION: Pt states that she continue to do well. We returned to working on pelvic tilts to help improve motor control. She made some good improvements here and was able to perform better on swiss ball. Doing some seated forward fold to help stretch out back muscles seemed to be very helpful in improving her mobility with pelvic tilts. We discussed how working on this motor control will also directly impact motor control over pelvic floor muscles. Good tolerance to all swiss ball exercises with improving confidence and balance as she went through different activities. She will continue to benefit from skilled PT intervention in order to improve ability to have bowel movement without straining, decrease fecal incontinence, decrease stress urinary incontinence, and begin/progress functional strengthening program.    OBJECTIVE IMPAIRMENTS: decreased activity tolerance, decreased coordination, decreased endurance, decreased strength, increased fascial restrictions, increased muscle spasms, impaired tone, postural dysfunction, and pain.   ACTIVITY LIMITATIONS:  continence  PARTICIPATION LIMITATIONS: community activity  PERSONAL FACTORS: 1 comorbidity: medical history  are also affecting patient's functional outcome.   REHAB POTENTIAL: Good  CLINICAL DECISION MAKING: Stable/uncomplicated  EVALUATION COMPLEXITY: Low   GOALS: Goals reviewed with patient? Yes  SHORT TERM GOALS: Target date: 02/10/23 - updated 03/27/23  Pt will be independent with HEP.   Baseline: Goal status: MET 03/27/23  2.  Pt will be able to consistently perform pelvic floor muscle contraction with full A/ROM and appropriate breath coordination.  Baseline:  Goal status: MET 03/27/23  3.  Pt will be independent with double voiding in order to completely empty bladder. Baseline:  Goal status: MET 03/27/23  4.  Pt will be independent with use of squatty potty, relaxed toileting mechanics, and improved bowel movement techniques in order to increase ease of bowel movements and complete evacuation.   Baseline:  Goal status: MET  03/27/23    LONG TERM GOALS: Target date: 03/10/2023 - updated 03/27/23  Pt will be independent with advanced HEP.   Baseline:  Goal status: IN PROGRESS 03/27/23  2.  Pt will demonstrate increased pelvic floor muscle and external anal sphincter strength to consistently 3/5 in order to decrease episodes of fecal incontinence.   Baseline:  Goal status: IN PROGRESS 03/27/23  3.  Pt will report no episodes of urinary or fecal incontinence in order to improve confidence in community activities and personal hygiene.   Baseline:  Goal status: IN PROGRESS 03/27/23  4.  Pt will be able to have bowel movement every other day without straining and sensation of complete evacuation.  Baseline:  Goal status: IN PROGRESS 03/27/23  PLAN:  PT FREQUENCY: 1-2x/week  PT DURATION: 8 weeks  PLANNED INTERVENTIONS: Therapeutic exercises, Therapeutic activity, Neuromuscular re-education, Balance training, Gait training, Patient/Family education, Self  Care, Joint mobilization, Dry Needling, Biofeedback, and Manual therapy  PLAN FOR NEXT SESSION: Core training/progressions; bowel massage; inverted lying.    Josette Mares, PT, DPT01/02/253:30 PM

## 2023-04-16 ENCOUNTER — Encounter: Payer: Self-pay | Admitting: Family Medicine

## 2023-04-16 ENCOUNTER — Ambulatory Visit: Payer: Medicare Other | Admitting: Family Medicine

## 2023-04-16 VITALS — BP 168/82 | HR 77 | Ht 66.0 in | Wt 145.4 lb

## 2023-04-16 DIAGNOSIS — N1832 Chronic kidney disease, stage 3b: Secondary | ICD-10-CM | POA: Diagnosis not present

## 2023-04-16 DIAGNOSIS — E1122 Type 2 diabetes mellitus with diabetic chronic kidney disease: Secondary | ICD-10-CM

## 2023-04-16 DIAGNOSIS — I10 Essential (primary) hypertension: Secondary | ICD-10-CM

## 2023-04-16 DIAGNOSIS — M14679 Charcot's joint, unspecified ankle and foot: Secondary | ICD-10-CM | POA: Diagnosis not present

## 2023-04-16 NOTE — Assessment & Plan Note (Signed)
 A1c stable at this time.  Continue Amaryl.

## 2023-04-16 NOTE — Addendum Note (Signed)
 Addended by: [name] on: 04/16/2023 03:06 PM   Modules accepted: Level of Service

## 2023-04-16 NOTE — Assessment & Plan Note (Signed)
 BP elevated here today.  Follow-up nurse visit in 2 weeks.

## 2023-04-16 NOTE — Patient Instructions (Signed)
 BP check in 2 weeks.  Take care  Dr. Adriana Simas

## 2023-04-16 NOTE — Progress Notes (Signed)
 Subjective:  Patient ID: Stacy Webb, female    DOB: May 05, 1945  Age: 78 y.o. MRN: [id number]  CC:   Chief Complaint  Patient presents with   Office Visit     Regarding her diabetic shoes and needing forms completed   Questions about immunizatons    HPI:  78 year old female with hypertension, type 2 diabetes with complications including chronic kidney disease and Charcot foot presents for evaluation of the above.  Patient needs new custom molded diabetic shoes.  These are medically necessary due to her abnormal foot anatomy due to Charcot foot.  This is to prevent complications/wounds.  She currently has a hyperkeratotic area that is troublesome.  This definitely warrants new custom orthotics.  Her diabetes is well-controlled at this time.  Most recent A1c 6.9.  She is compliant with Amaryl .  Patient is overall feeling well.  BP elevated here today and on repeat.  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)    Breast cancer of upper-outer quadrant of left female breast (HCC) 11/12/2021   Cholelithiasis 10/29/2020   Rotator cuff arthropathy of left shoulder 07/10/2020   CKD (chronic kidney disease) stage 3, GFR 30-59 ml/min (HCC) 09/27/2016   Hyperlipidemia 09/27/2016   Essential hypertension, benign 08/11/2012    Social Hx   Social History   Socioeconomic History   Marital status: Widowed    Spouse name: Not on file   Number of children: 3   Years of education: Not on file   Highest education level: Not on file  Occupational History   Occupation: [employer]    Comment: Medical Records  Tobacco Use   Smoking status: Never   Smokeless  tobacco: Never  Vaping Use   Vaping status: Never Used  Substance and Sexual Activity   Alcohol use: No   Drug use: No   Sexual activity: Not Currently    Birth control/protection: Post-menopausal  Other Topics Concern   Not on file  Social History Narrative   Husband had ALS and passed away.Twin boys, but one died at 18 months and her other son died in 2015-03-22.      Living daughter- lives with pt.   Social Drivers of Corporate Investment Banker Strain: Low Risk  (11/08/2022)   Overall Financial Resource Strain (CARDIA)    Difficulty of Paying Living Expenses: Not hard at all  Food Insecurity: No Food Insecurity (11/08/2022)   Hunger Vital Sign    Worried About Running Out of Food in the Last Year: Never true    Ran Out of Food in the Last Year: Never true  Transportation Needs: No Transportation Needs (11/08/2022)   PRAPARE - Administrator, Civil Service (Medical): No    Lack of Transportation (Non-Medical): No  Physical Activity: Insufficiently Active (11/08/2022)   Exercise Vital Sign    Days of Exercise per Week: 2 days    Minutes of Exercise per Session: 60 min  Stress: No Stress Concern Present (11/08/2022)   [employer] of Occupational Health - Occupational Stress Questionnaire    Feeling of Stress : Only a little  Social Connections: Moderately Integrated (11/08/2022)   Social Connection and  Isolation Panel [NHANES]    Frequency of Communication with Friends and Family: More than three times a week    Frequency of Social Gatherings with Friends and Family: More than three times a week    Attends Religious Services: More than 4 times per year    Active Member of [employer] or Organizations: Yes    Attends Banker Meetings: More than 4 times per year    Marital Status: Widowed    Review of Systems Per HPI  Objective:  BP (!) 168/82   Pulse 77   Ht 5' 6 (1.676 m)   Wt 145 lb 6.4 oz (66 kg)   SpO2 98%   BMI 23.47 kg/m      04/16/2023    2:59 PM  04/16/2023    2:26 PM 04/16/2023    2:20 PM  BP/Weight  Systolic BP 168 188 192  Diastolic BP 82 79 85  Wt. (Lbs)  145.4 145.4  BMI  23.47 kg/m2 23.47 kg/m2    Physical Exam Vitals and nursing note reviewed.  Constitutional:      Appearance: Normal appearance.  HENT:     Head: Normocephalic and atraumatic.  Cardiovascular:     Rate and Rhythm: Normal rate and regular rhythm.  Pulmonary:     Effort: Pulmonary effort is normal.     Breath sounds: Normal breath sounds.  Skin:    Comments: Charcot foot noted.  Patient has a callus with a small open area to the first metatarsal on the plantar aspect of the right foot.  Neurological:     Mental Status: She is alert.     Lab Results  Component Value Date   WBC 10.1 11/15/2022   HGB 11.6 (L) 11/15/2022   HCT 35.5 (L) 11/15/2022   PLT 231 11/15/2022   GLUCOSE 93 02/21/2023   CHOL 147 10/30/2022   TRIG 104 10/30/2022   HDL 47 10/30/2022   LDLCALC 81 10/30/2022   ALT 17 11/15/2022   AST 23 11/15/2022   NA 143 02/21/2023   K 4.2 02/21/2023   CL 106 02/21/2023   CREATININE 1.42 (H) 02/21/2023   BUN 25 02/21/2023   CO2 24 02/21/2023   HGBA1C 6.9 (H) 02/21/2023     Assessment & Plan:   Problem List Items Addressed This Visit       Cardiovascular and Mediastinum   Essential hypertension, benign   BP elevated here today.  Follow-up nurse visit in 2 weeks.        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  Continue Amaryl .        Musculoskeletal and Integument   Charcot's joint of foot - Primary   Form filled out regarding diabetic shoes.  She is under my care and is in need of diabetic shoes due to Charcot foot.      Follow-up:  Return in about 2 weeks (around 04/30/2023) for BP check - Nurse visit.  Jacqulyn Ahle DO [facility]

## 2023-04-16 NOTE — Assessment & Plan Note (Signed)
 Form filled out regarding diabetic shoes.  She is under my care and is in need of diabetic shoes due to Charcot foot.

## 2023-04-17 ENCOUNTER — Ambulatory Visit: Payer: Medicare Other

## 2023-04-17 DIAGNOSIS — R269 Unspecified abnormalities of gait and mobility: Secondary | ICD-10-CM | POA: Diagnosis not present

## 2023-04-17 DIAGNOSIS — M6281 Muscle weakness (generalized): Secondary | ICD-10-CM

## 2023-04-17 DIAGNOSIS — R293 Abnormal posture: Secondary | ICD-10-CM

## 2023-04-17 DIAGNOSIS — R279 Unspecified lack of coordination: Secondary | ICD-10-CM

## 2023-04-17 DIAGNOSIS — R278 Other lack of coordination: Secondary | ICD-10-CM

## 2023-04-17 NOTE — Therapy (Signed)
 OUTPATIENT PHYSICAL THERAPY FEMALE PELVIC TREATMENT   Patient Name: Stacy Webb MRN: [id number] DOB:10/18/1945, 78 y.o., female Today's Date: 04/17/2023  END OF SESSION:  PT End of Session - 04/17/23 1449     Visit Number 7    Date for PT Re-Evaluation 05/22/23    Authorization Type [employer] Medicare    Authorization Time Period 03/11/2023 to 05/06/2023    Authorization - Visit Number 4    Authorization - Number of Visits 4    Progress Note Due on Visit 10    PT Start Time 1445    PT Stop Time 1525    PT Time Calculation (min) 40 min    Activity Tolerance Patient tolerated treatment well    Behavior During Therapy [facility] for tasks assessed/performed               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)    Diabetes mellitus without complication (HCC)    Diabetic foot ulcer (HCC) 09/27/2016   Excessive hair growth 07/19/2015   Heart murmur    History of kidney stones    Hyperlipidemia    Hypertension    Neuromuscular disorder (HCC)    diabetic neuropathy   Neuropathy    Postmenopausal 07/19/2015   Past Surgical History:  Procedure Laterality Date   ANTERIOR AND POSTERIOR REPAIR N/A 04/28/2018   Procedure: ANTERIOR (CYSTOCELE) AND POSTERIOR REPAIR (RECTOCELE);  Surgeon: Edsel Norleen GAILS, MD;  Location: [facility];  Service: Gynecology;  Laterality: N/A;   APPENDECTOMY     BREAST BIOPSY Left 2023   invasive ca   BREAST LUMPECTOMY Left 2023   invasive ca   CARPAL TUNNEL RELEASE Bilateral 2001   CESAREAN SECTION     COLONOSCOPY N/A 08/28/2012   Procedure: COLONOSCOPY;  Surgeon: Claudis RAYMOND Rivet, MD;  Location: [facility];  Service: Endoscopy;  Laterality: N/A;  915   COLONOSCOPY N/A 12/25/2017   Procedure: COLONOSCOPY;  Surgeon: Rivet Claudis RAYMOND, MD;  Location: [facility];  Service: Endoscopy;  Laterality: N/A;  12:45   REVERSE SHOULDER ARTHROPLASTY Left 07/10/2020   Procedure:  REVERSE SHOULDER ARTHROPLASTY;  Surgeon: Onesimo Oneil LABOR, MD;  Location: [facility];  Service: Orthopedics;  Laterality: Left;   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)    Breast cancer of upper-outer quadrant of left female breast (HCC) 11/12/2021   Cholelithiasis 10/29/2020   Rotator cuff arthropathy of left shoulder 07/10/2020   CKD (chronic kidney disease) stage 3, GFR 30-59 ml/min (HCC) 09/27/2016   Hyperlipidemia 09/27/2016   Essential hypertension, benign 08/11/2012    PCP: Cook, [name], DO  REFERRING PROVIDER: Ozan, Jennifer, DO  REFERRING DIAG: R15.9,K59.00 (ICD-10-CM) - Fecal incontinence alternating with constipation N39.46 (ICD-10-CM) - Mixed stress and urge urinary incontinence  THERAPY DIAG:  Abnormal posture  Muscle weakness (generalized)  Unspecified lack of coordination  Other lack of coordination  Rationale for Evaluation and Treatment: Rehabilitation  ONSET DATE: April 2024  SUBJECTIVE:  SUBJECTIVE STATEMENT: Pt states that she is doing well overall. She thinks she is about 75% better. She feel confident discharging today.   PAIN:  Are you having pain? No   PRECAUTIONS: Other: Breast cancer dx a year ago - no radiation/chemo   RED FLAGS: None   WEIGHT BEARING RESTRICTIONS: No  FALLS:  Has patient fallen in last 6 months? Yes. Number of falls 2x  LIVING ENVIRONMENT: Lives with: lives alone Lives in:  House/apartment   OCCUPATION: retired   PLOF: Independent  PATIENT GOALS: get control over bowel movement and have one without straining so hard   PERTINENT HISTORY:  Anterior/posterior vaginal wall repair; breast cancer 2023/lumpectomy, appendectomy, c-section x2,   BOWEL MOVEMENT: Pain with bowel movement: No Type of bowel movement:Type (Bristol Stool Scale) 1, Frequency 2-3 days , and Strain Yes Fully empty rectum: No - she is not using squatty potty  Leakage: Yes: very occasionally she will find ball of stool in pad when she goes to urinate Pads: Yes: 1-2/day Fiber supplement: No  URINATION: Pain with urination: No Fully empty bladder: Yes: but then she'll have to come back once she gets up Stream: Strong Urgency: Yes: at night  Frequency: every 3 hours  Leakage: Coughing and at night not getting up in time; very rarely Pads: Yes: 1-2/day  INTERCOURSE: Not sexually active, no history of pain   PREGNANCY: Vaginal deliveries 0 C-section deliveries 3   PROLAPSE: Ball in vagina   OBJECTIVE:  Note: Objective measures were completed at Evaluation unless otherwise noted. 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 2 second single leg stance on Lt LE with extreme compensation with weight shift over Lt LE  GAIT: Comments: Lt compensated trendelenburg, Rt antalgic gait pattern   POSTURE: rounded shoulders, forward head, decreased lumbar lordosis, increased thoracic kyphosis, posterior pelvic tilt, and Rt  thoracic curvature  LUMBARAROM/PROM:  A/PROM A/PROM  Eval (% available)  Flexion 80  Extension 75  Right lateral flexion 50  Left lateral flexion 50  Right rotation 50  Left rotation 50   (Blank rows = not tested)   PALPATION:   General  tenderness in Lt lower quadrant                External Perineal Exam redness/fragile looking tissue                             Internal Pelvic Floor tissue tenderness throughout; very sensitive to any movement  Patient confirms identification and approves PT to assess internal pelvic floor and treatment Yes  PELVIC MMT:   MMT eval  Vaginal 1/5, poor coordination of active contraction vs bearing down and feeling when she is doing each one; 3 second hold; 3 repeat contractions  Internal Anal Sphincter 0-1/5  External Anal Sphincter 0-1/5  Puborectalis 0-1/5  Diastasis Recti 4 finger width separation/distortion with increased pressure   (Blank rows = not tested)        TONE: low  PROLAPSE: Grade 2 anterior vaginal wall laxity  TODAY'S TREATMENT:  DATE:  04/17/23 Neuromuscular re-education: Bridge with hip adduction, transversus abdominus, and pelvic floor muscle 2 x 10 Seated hip adduction ball press with transversus abdominus and pelvic floor muscle 2 x 10 Seated hip abduction red band with transversus abdominus and pelvic floor muscle 2 x 10 Seated resisted march red band with transversus abdominus and pelvic floor muscle 2 x 10 Exercises: Lower trunk rotation 2 x 10 Single knee to chest 10x bil Standing hip abduction 2 x 10 bil at table Standing hip extension 2 x 10 bil at table Standing march 2 x 10 bil at table Sidestepping without resistance at table 5 laps   04/10/23 Neuromuscular re-education: Seated pelvic tilts 2 x 10 with multimodal cues to help improve motor control through pelvis Swiss ball  activities: Bil circles 2 x 10 bil Lateral glides 2 x 10 Marching 2 x 10 Hip abduction red band 2 x 10 Pelvic tilts Heel raises/toe raises 2 x 10 Standing ball roll outs 10x Supine lower trunk rotation with LE on ball 10x bil Exercises: Seated forward fold 2 x 60 seconds Seated lateral flexion 2 x 60 seconds bil   04/03/23 Neuromuscular re-education: Horizontal shoulder abduction side lying 10x 2lb weight with transversus abdominus and pelvic floor muscle  Seated horizontal shoulder abduction/extension 2 x 10 red band  Seated pelvic tilts 2 x 10 Exercises: Clam shell 2 x 10 bil Reverse clam shell 2 x 10 bil Side lying hip abduction 10x bil Standing hip abduction 2 x 10 bil at table Standing hip extension 2 x 10 bil at table Sidestepping without resistance at table 5 laps  Seated hamstring stretch 60 sec bil    PATIENT EDUCATION:  Education details: See above Person educated: Patient Education method: Explanation, Demonstration, Tactile cues, Verbal cues, and Handouts Education comprehension: verbalized understanding  HOME EXERCISE PROGRAM: [id number]  ASSESSMENT:  CLINICAL IMPRESSION: Pt is doing much better overall. She reports feeling 75% better. She has done very well progressing towards goals and feels like she has a good exercise program that will help her continue to see progress. We reviewed many exercises from HEP today for appropriate form and challenge; she is doing well with all activities. Due to progress, she is prepared to D/C skilled PT intervention at this time; she was encouraged to call with any questions/concerns.    OBJECTIVE IMPAIRMENTS: decreased activity tolerance, decreased coordination, decreased endurance, decreased strength, increased fascial restrictions, increased muscle spasms, impaired tone, postural dysfunction, and pain.   ACTIVITY LIMITATIONS: continence  PARTICIPATION LIMITATIONS: community activity  PERSONAL FACTORS: 1 comorbidity:  medical history  are also affecting patient's functional outcome.   REHAB POTENTIAL: Good  CLINICAL DECISION MAKING: Stable/uncomplicated  EVALUATION COMPLEXITY: Low   GOALS: Goals reviewed with patient? Yes  SHORT TERM GOALS: Target date: 02/10/23 - updated 03/27/23 - updated 04/17/23  Pt will be independent with HEP.   Baseline: Goal status: MET 03/27/23  2.  Pt will be able to consistently perform pelvic floor muscle contraction with full A/ROM and appropriate breath coordination.  Baseline:  Goal status: MET 03/27/23  3.  Pt will be independent with double voiding in order to completely empty bladder. Baseline:  Goal status: MET 03/27/23  4.  Pt will be independent with use of squatty potty, relaxed toileting mechanics, and improved bowel movement techniques in order to increase ease of bowel movements and complete evacuation.   Baseline:  Goal status: MET 03/27/23    LONG TERM GOALS: Target date: 03/10/2023 - updated 03/27/23 -  updated 04/17/23  Pt will be independent with advanced HEP.   Baseline:  Goal status: MET 04/17/23  2.  Pt will demonstrate increased pelvic floor muscle and external anal sphincter strength to consistently 3/5 in order to decrease episodes of fecal incontinence.   Baseline:  Goal status: DISCHARGED 04/17/23  3.  Pt will report no episodes of urinary or fecal incontinence in order to improve confidence in community activities and personal hygiene.   Baseline: 75% improvement Goal status: DISCHARGED 04/17/23  4.  Pt will be able to have bowel movement every other day without straining and sensation of complete evacuation.  Baseline:  Goal status: MET 04/17/23  PLAN:  PT FREQUENCY: -  PT DURATION: -  PLANNED INTERVENTIONS: -  PLAN FOR NEXT SESSION: D/C  PHYSICAL THERAPY DISCHARGE SUMMARY  Visits from Start of Care: 7  Current functional level related to goals / functional outcomes: Independent   Remaining deficits: See above    Education / Equipment: HEP   Patient agrees to discharge. Patient goals were partially met. Patient is being discharged due to being pleased with the current functional level.  Josette Mares, PT, DPT01/09/253:17 PM

## 2023-04-28 DIAGNOSIS — M79671 Pain in right foot: Secondary | ICD-10-CM | POA: Diagnosis not present

## 2023-04-28 DIAGNOSIS — M79672 Pain in left foot: Secondary | ICD-10-CM | POA: Diagnosis not present

## 2023-04-28 DIAGNOSIS — E1140 Type 2 diabetes mellitus with diabetic neuropathy, unspecified: Secondary | ICD-10-CM | POA: Diagnosis not present

## 2023-04-28 DIAGNOSIS — I739 Peripheral vascular disease, unspecified: Secondary | ICD-10-CM | POA: Diagnosis not present

## 2023-04-28 DIAGNOSIS — L110 Acquired keratosis follicularis: Secondary | ICD-10-CM | POA: Diagnosis not present

## 2023-04-30 ENCOUNTER — Ambulatory Visit: Payer: Medicare Other | Admitting: Family Medicine

## 2023-05-01 ENCOUNTER — Ambulatory Visit (INDEPENDENT_AMBULATORY_CARE_PROVIDER_SITE_OTHER): Payer: Medicare Other | Admitting: Family Medicine

## 2023-05-01 VITALS — BP 137/71 | HR 75 | Temp 97.2°F | Ht 66.0 in | Wt 143.2 lb

## 2023-05-01 DIAGNOSIS — I10 Essential (primary) hypertension: Secondary | ICD-10-CM

## 2023-05-01 NOTE — Patient Instructions (Signed)
Continue your medications.    Follow up in 6 months

## 2023-05-01 NOTE — Assessment & Plan Note (Signed)
Well controlled. Continue current medications  

## 2023-05-01 NOTE — Addendum Note (Signed)
Addended by: [name] on: 05/01/2023 12:48 PM   Modules accepted: Orders, Level of Service

## 2023-05-01 NOTE — Progress Notes (Addendum)
Subjective:  Patient ID: Stacy Webb, female    DOB: [birth year]  Age: 78 y.o. MRN: [id number]  CC:  Follow up hypertension   HPI:  78 year old female presents for follow-up regarding hypertension.  At last visit, her blood pressure was significantly elevated.  BP initially elevated here but improved on repeat.  She is doing well.  She is feeling well.  She has no complaints at this time.    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)    Breast cancer of upper-outer quadrant of left female breast (HCC) 11/12/2021   Cholelithiasis 10/29/2020   Rotator cuff arthropathy of left shoulder 07/10/2020   CKD (chronic kidney disease) stage 3, GFR 30-59 ml/min (HCC) 09/27/2016   Hyperlipidemia 09/27/2016   Essential hypertension, benign 08/11/2012    Social Hx   Social History   Socioeconomic History   Marital status: Widowed    Spouse name: Not on file   Number of children: 3   Years of education: Not on file   Highest education level: Not on file  Occupational History   Occupation: [employer]    Comment: Medical Records  Tobacco Use   Smoking status: Never   Smokeless tobacco: Never  Vaping Use   Vaping status: Never Used  Substance and Sexual Activity   Alcohol use: No   Drug use: No   Sexual activity: Not Currently    Birth control/protection: Post-menopausal  Other Topics Concern   Not on file  Social History Narrative   Husband had ALS and passed away.Twin boys, but one died at 40 months and her other son died in 2014-05-15.      Living daughter- lives with pt.   Social Drivers of Corporate investment banker Strain: Low Risk  (11/08/2022)    Overall Financial Resource Strain (CARDIA)    Difficulty of Paying Living Expenses: Not hard at all  Food Insecurity: No Food Insecurity (11/08/2022)   Hunger Vital Sign    Worried About Running Out of Food in the Last Year: Never true    Ran Out of Food in the Last Year: Never true  Transportation Needs: No Transportation Needs (11/08/2022)   PRAPARE - Administrator, Civil Service (Medical): No    Lack of Transportation (Non-Medical): No  Physical Activity: Insufficiently Active (11/08/2022)   Exercise Vital Sign    Days of Exercise per Week: 2 days    Minutes of Exercise per Session: 60 min  Stress: No Stress Concern Present (11/08/2022)   [employer] of Occupational Health - Occupational Stress Questionnaire    Feeling of Stress : Only a little  Social Connections: Moderately Integrated (11/08/2022)   Social Connection and Isolation Panel [NHANES]    Frequency of Communication with Friends and Family: More than three times a week    Frequency of Social Gatherings with Friends and Family: More than three times a week    Attends Religious Services: More than 4 times per year    Active Member of [employer] or Organizations: Yes    Attends Banker Meetings: More than 4 times per year    Marital Status: Widowed  Review of Systems Per HPI  Objective:  BP 137/71   Pulse 75   Temp (!) 97.2 F (36.2 C)   Ht 5\' 6"  (1.676 m)   Wt 143 lb 3.2 oz (65 kg)   SpO2 99%   BMI 23.11 kg/m      05/01/2023   11:14 AM 05/01/2023   10:59 AM 04/16/2023    2:59 PM  BP/Weight  Systolic BP 137 150 168  Diastolic BP 71 66 82  Wt. (Lbs)  143.2   BMI  23.11 kg/m2     Physical Exam Vitals and nursing note reviewed.  Constitutional:      General: She is not in acute distress.    Appearance: Normal appearance.  HENT:     Head: Normocephalic and atraumatic.  Eyes:     General:        Right eye: No discharge.        Left eye: No discharge.     Conjunctiva/sclera:  Conjunctivae normal.  Cardiovascular:     Rate and Rhythm: Normal rate and regular rhythm.  Pulmonary:     Effort: Pulmonary effort is normal.     Breath sounds: Normal breath sounds. No wheezing, rhonchi or rales.  Neurological:     Mental Status: She is alert.  Psychiatric:        Mood and Affect: Mood normal.        Behavior: Behavior normal.     Lab Results  Component Value Date   WBC 10.1 11/15/2022   HGB 11.6 (L) 11/15/2022   HCT 35.5 (L) 11/15/2022   PLT 231 11/15/2022   GLUCOSE 93 02/21/2023   CHOL 147 10/30/2022   TRIG 104 10/30/2022   HDL 47 10/30/2022   LDLCALC 81 10/30/2022   ALT 17 11/15/2022   AST 23 11/15/2022   NA 143 02/21/2023   K 4.2 02/21/2023   CL 106 02/21/2023   CREATININE 1.42 (H) 02/21/2023   BUN 25 02/21/2023   CO2 24 02/21/2023   HGBA1C 6.9 (H) 02/21/2023     Assessment & Plan:   Problem List Items Addressed This Visit       Cardiovascular and Mediastinum   Essential hypertension, benign - Primary   Well-controlled.  Continue current medications.       Follow-up:  Return in about 6 months (around 10/29/2023).  Everlene Other DO [facility]

## 2023-05-18 ENCOUNTER — Other Ambulatory Visit: Payer: Self-pay | Admitting: Family Medicine

## 2023-05-18 DIAGNOSIS — I10 Essential (primary) hypertension: Secondary | ICD-10-CM

## 2023-05-20 ENCOUNTER — Inpatient Hospital Stay: Payer: Medicare Other

## 2023-05-20 ENCOUNTER — Inpatient Hospital Stay: Payer: Medicare Other | Attending: Hematology

## 2023-05-20 DIAGNOSIS — I129 Hypertensive chronic kidney disease with stage 1 through stage 4 chronic kidney disease, or unspecified chronic kidney disease: Secondary | ICD-10-CM | POA: Diagnosis not present

## 2023-05-20 DIAGNOSIS — Z9049 Acquired absence of other specified parts of digestive tract: Secondary | ICD-10-CM | POA: Insufficient documentation

## 2023-05-20 DIAGNOSIS — Z800 Family history of malignant neoplasm of digestive organs: Secondary | ICD-10-CM | POA: Insufficient documentation

## 2023-05-20 DIAGNOSIS — N6325 Unspecified lump in the left breast, overlapping quadrants: Secondary | ICD-10-CM | POA: Insufficient documentation

## 2023-05-20 DIAGNOSIS — Z881 Allergy status to other antibiotic agents status: Secondary | ICD-10-CM | POA: Diagnosis not present

## 2023-05-20 DIAGNOSIS — C50812 Malignant neoplasm of overlapping sites of left female breast: Secondary | ICD-10-CM | POA: Insufficient documentation

## 2023-05-20 DIAGNOSIS — Z79899 Other long term (current) drug therapy: Secondary | ICD-10-CM | POA: Insufficient documentation

## 2023-05-20 DIAGNOSIS — D631 Anemia in chronic kidney disease: Secondary | ICD-10-CM | POA: Insufficient documentation

## 2023-05-20 DIAGNOSIS — C50412 Malignant neoplasm of upper-outer quadrant of left female breast: Secondary | ICD-10-CM

## 2023-05-20 DIAGNOSIS — Z825 Family history of asthma and other chronic lower respiratory diseases: Secondary | ICD-10-CM | POA: Insufficient documentation

## 2023-05-20 DIAGNOSIS — E1122 Type 2 diabetes mellitus with diabetic chronic kidney disease: Secondary | ICD-10-CM | POA: Diagnosis not present

## 2023-05-20 DIAGNOSIS — Z79810 Long term (current) use of selective estrogen receptor modulators (SERMs): Secondary | ICD-10-CM | POA: Diagnosis not present

## 2023-05-20 DIAGNOSIS — Z1732 Human epidermal growth factor receptor 2 negative status: Secondary | ICD-10-CM | POA: Diagnosis not present

## 2023-05-20 DIAGNOSIS — Z801 Family history of malignant neoplasm of trachea, bronchus and lung: Secondary | ICD-10-CM | POA: Insufficient documentation

## 2023-05-20 DIAGNOSIS — Z87442 Personal history of urinary calculi: Secondary | ICD-10-CM | POA: Diagnosis not present

## 2023-05-20 DIAGNOSIS — E785 Hyperlipidemia, unspecified: Secondary | ICD-10-CM | POA: Insufficient documentation

## 2023-05-20 DIAGNOSIS — Z1721 Progesterone receptor positive status: Secondary | ICD-10-CM | POA: Insufficient documentation

## 2023-05-20 DIAGNOSIS — Z170 Estrogen receptor positive status [ER+]: Secondary | ICD-10-CM | POA: Diagnosis not present

## 2023-05-20 DIAGNOSIS — N183 Chronic kidney disease, stage 3 unspecified: Secondary | ICD-10-CM | POA: Diagnosis not present

## 2023-05-20 DIAGNOSIS — Z818 Family history of other mental and behavioral disorders: Secondary | ICD-10-CM | POA: Diagnosis not present

## 2023-05-20 DIAGNOSIS — D509 Iron deficiency anemia, unspecified: Secondary | ICD-10-CM

## 2023-05-20 DIAGNOSIS — Z833 Family history of diabetes mellitus: Secondary | ICD-10-CM | POA: Diagnosis not present

## 2023-05-20 LAB — COMPREHENSIVE METABOLIC PANEL
ALT: 18 U/L (ref 0–44)
AST: 26 U/L (ref 15–41)
Albumin: 3.8 g/dL (ref 3.5–5.0)
Alkaline Phosphatase: 35 U/L — ABNORMAL LOW (ref 38–126)
Anion gap: 11 (ref 5–15)
BUN: 32 mg/dL — ABNORMAL HIGH (ref 8–23)
CO2: 23 mmol/L (ref 22–32)
Calcium: 8.9 mg/dL (ref 8.9–10.3)
Chloride: 106 mmol/L (ref 98–111)
Creatinine, Ser: 1.53 mg/dL — ABNORMAL HIGH (ref 0.44–1.00)
GFR, Estimated: 35 mL/min — ABNORMAL LOW (ref >60)
Glucose, Bld: 194 mg/dL — ABNORMAL HIGH (ref 70–99)
Potassium: 3.8 mmol/L (ref 3.5–5.1)
Sodium: 140 mmol/L (ref 135–145)
Total Bilirubin: 0.6 mg/dL (ref 0.0–1.2)
Total Protein: 6.8 g/dL (ref 6.5–8.1)

## 2023-05-20 LAB — CBC WITH DIFFERENTIAL/PLATELET
Abs Immature Granulocytes: 0.03 10*3/uL (ref 0.00–0.07)
Basophils Absolute: 0.1 10*3/uL (ref 0.0–0.1)
Basophils Relative: 1 %
Eosinophils Absolute: 0.2 10*3/uL (ref 0.0–0.5)
Eosinophils Relative: 3 %
HCT: 33.4 % — ABNORMAL LOW (ref 36.0–46.0)
Hemoglobin: 10.9 g/dL — ABNORMAL LOW (ref 12.0–15.0)
Immature Granulocytes: 0 %
Lymphocytes Relative: 27 %
Lymphs Abs: 2.3 10*3/uL (ref 0.7–4.0)
MCH: 28.8 pg (ref 26.0–34.0)
MCHC: 32.6 g/dL (ref 30.0–36.0)
MCV: 88.4 fL (ref 80.0–100.0)
Monocytes Absolute: 1.1 10*3/uL — ABNORMAL HIGH (ref 0.1–1.0)
Monocytes Relative: 13 %
Neutro Abs: 4.8 10*3/uL (ref 1.7–7.7)
Neutrophils Relative %: 56 %
Platelets: 222 10*3/uL (ref 150–400)
RBC: 3.78 MIL/uL — ABNORMAL LOW (ref 3.87–5.11)
RDW: 13.8 % (ref 11.5–15.5)
WBC: 8.5 10*3/uL (ref 4.0–10.5)
nRBC: 0 % (ref 0.0–0.2)

## 2023-05-20 LAB — IRON AND TIBC
Iron: 62 ug/dL (ref 28–170)
Saturation Ratios: 17 % (ref 10.4–31.8)
TIBC: 363 ug/dL (ref 250–450)
UIBC: 301 ug/dL

## 2023-05-20 LAB — VITAMIN D 25 HYDROXY (VIT D DEFICIENCY, FRACTURES): Vit D, 25-Hydroxy: 51.75 ng/mL (ref 30–100)

## 2023-05-20 LAB — FERRITIN: Ferritin: 386 ng/mL — ABNORMAL HIGH (ref 11–307)

## 2023-05-26 ENCOUNTER — Ambulatory Visit: Payer: Medicare Other | Admitting: Family Medicine

## 2023-05-26 ENCOUNTER — Encounter: Payer: Self-pay | Admitting: Family Medicine

## 2023-05-26 VITALS — BP 138/68 | HR 68 | Temp 97.2°F | Ht 66.0 in | Wt 145.0 lb

## 2023-05-26 DIAGNOSIS — E1122 Type 2 diabetes mellitus with diabetic chronic kidney disease: Secondary | ICD-10-CM

## 2023-05-26 DIAGNOSIS — I10 Essential (primary) hypertension: Secondary | ICD-10-CM

## 2023-05-26 DIAGNOSIS — E785 Hyperlipidemia, unspecified: Secondary | ICD-10-CM | POA: Diagnosis not present

## 2023-05-26 DIAGNOSIS — N1832 Chronic kidney disease, stage 3b: Secondary | ICD-10-CM | POA: Diagnosis not present

## 2023-05-26 MED ORDER — VALSARTAN 80 MG PO TABS
80.0000 mg | ORAL_TABLET | Freq: Every day | ORAL | 3 refills | Status: AC
Start: 2023-05-26 — End: ?

## 2023-05-26 MED ORDER — GLIMEPIRIDE 1 MG PO TABS: 1.0000 mg | ORAL_TABLET | Freq: Every day | ORAL | 2 refills | Status: DC

## 2023-05-26 NOTE — Patient Instructions (Signed)
You're doing well. Continue your medications.  Follow up in 3 months.

## 2023-05-26 NOTE — Assessment & Plan Note (Signed)
Stable.  Continue valsartan. 

## 2023-05-26 NOTE — Progress Notes (Signed)
Subjective:  Patient ID: Stacy Webb, female    DOB: July 11, 1945  Age: 78 y.o. MRN: [id number]  CC:   Chief Complaint  Patient presents with   Hypertension    F/u hypertension    HPI:  78 year old female with the below mentioned medical problems presents for follow-up.  Hypertension stable on valsartan.  Creatinine relatively stable.  Most recent creatinine 1.53.  GFR 35.  Slightly down from previous GFR of 38.  Monitoring closely.  Patient reports that she is feeling well.  Has some anxiety and stress around her daughter but overall is doing well.  Type 2 diabetes stable on Amaryl.  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)    Breast cancer of upper-outer quadrant of left female breast (HCC) 11/12/2021   Cholelithiasis 10/29/2020   Rotator cuff arthropathy of left shoulder 07/10/2020   CKD (chronic kidney disease) stage 3, GFR 30-59 ml/min (HCC) 09/27/2016   Hyperlipidemia 09/27/2016   Essential hypertension, benign 08/11/2012    Social Hx   Social History   Socioeconomic History   Marital status: Widowed    Spouse name: Not on file   Number of children: 3   Years of education: Not on file   Highest education level: Not on file  Occupational History   Occupation: [employer]    Comment: Medical Records  Tobacco Use   Smoking status: Never   Smokeless tobacco: Never  Vaping Use   Vaping status: Never Used  Substance and Sexual Activity   Alcohol use: No   Drug use: No   Sexual activity: Not Currently    Birth control/protection: Post-menopausal  Other Topics Concern   Not on file  Social History Narrative   Husband had ALS and  passed away.Twin boys, but one died at 58 months and her other son died in Jul 01, 2014.      Living daughter- lives with pt.   Social Drivers of Corporate investment banker Strain: Low Risk  (11/08/2022)   Overall Financial Resource Strain (CARDIA)    Difficulty of Paying Living Expenses: Not hard at all  Food Insecurity: No Food Insecurity (11/08/2022)   Hunger Vital Sign    Worried About Running Out of Food in the Last Year: Never true    Ran Out of Food in the Last Year: Never true  Transportation Needs: No Transportation Needs (11/08/2022)   PRAPARE - Administrator, Civil Service (Medical): No    Lack of Transportation (Non-Medical): No  Physical Activity: Insufficiently Active (11/08/2022)   Exercise Vital Sign    Days of Exercise per Week: 2 days    Minutes of Exercise per Session: 60 min  Stress: No Stress Concern Present (11/08/2022)   [employer] of Occupational Health - Occupational Stress Questionnaire    Feeling of Stress : Only a little  Social Connections: Moderately Integrated (11/08/2022)   Social Connection and Isolation Panel [NHANES]    Frequency of Communication with Friends and Family: More than three times a week    Frequency of Social Gatherings with Friends and Family: More than three times a week    Attends Religious Services: More than 4 times  per year    Active Member of Clubs or Organizations: Yes    Attends Banker Meetings: More than 4 times per year    Marital Status: Widowed    Review of Systems Per HPI  Objective:  BP 138/68   Pulse 68   Temp (!) 97.2 F (36.2 C)   Ht 5\' 6"  (1.676 m)   Wt 145 lb (65.8 kg)   SpO2 99%   BMI 23.40 kg/m      05/26/2023   11:00 AM 05/01/2023   11:14 AM 05/01/2023   10:59 AM  BP/Weight  Systolic BP 138 137 150  Diastolic BP 68 71 66  Wt. (Lbs) 145  143.2  BMI 23.4 kg/m2  23.11 kg/m2    Physical Exam Vitals and nursing note reviewed.  Constitutional:      General: She is not in acute  distress.    Appearance: Normal appearance.  HENT:     Head: Normocephalic and atraumatic.  Cardiovascular:     Rate and Rhythm: Normal rate and regular rhythm.  Pulmonary:     Effort: Pulmonary effort is normal.     Breath sounds: Normal breath sounds. No wheezing, rhonchi or rales.  Neurological:     Mental Status: She is alert.  Psychiatric:        Mood and Affect: Mood normal.        Behavior: Behavior normal.     Lab Results  Component Value Date   WBC 8.5 05/20/2023   HGB 10.9 (L) 05/20/2023   HCT 33.4 (L) 05/20/2023   PLT 222 05/20/2023   GLUCOSE 194 (H) 05/20/2023   CHOL 147 10/30/2022   TRIG 104 10/30/2022   HDL 47 10/30/2022   LDLCALC 81 10/30/2022   ALT 18 05/20/2023   AST 26 05/20/2023   NA 140 05/20/2023   K 3.8 05/20/2023   CL 106 05/20/2023   CREATININE 1.53 (H) 05/20/2023   BUN 32 (H) 05/20/2023   CO2 23 05/20/2023   HGBA1C 6.9 (H) 02/21/2023     Assessment & Plan:  Essential hypertension, benign Assessment & Plan: Stable.  Continue valsartan.  Orders: -     Valsartan; Take 1 tablet (80 mg total) by mouth daily.  Dispense: 100 tablet; Refill: 3  Stage 3b chronic kidney disease (HCC) Assessment & Plan: Recent labs reviewed.  Creatinine and GFR relatively stable.  Will continue to monitor closely.  Orders: -     Valsartan; Take 1 tablet (80 mg total) by mouth daily.  Dispense: 100 tablet; Refill: 3  Type 2 diabetes mellitus with stage 3b chronic kidney disease, without long-term current use of insulin (HCC) Assessment & Plan: Stable.  Continue Amaryl.  Orders: -     Glimepiride; Take 1 tablet (1 mg total) by mouth daily with breakfast.  Dispense: 100 tablet; Refill: 2  Hyperlipidemia, unspecified hyperlipidemia type Assessment & Plan: Stable.    Follow-up:  Return in about 3 months (around 08/23/2023).  Everlene Other DO [facility]

## 2023-05-26 NOTE — Assessment & Plan Note (Signed)
 Stable

## 2023-05-26 NOTE — Assessment & Plan Note (Signed)
Stable.  Continue Amaryl.

## 2023-05-26 NOTE — Assessment & Plan Note (Signed)
Recent labs reviewed.  Creatinine and GFR relatively stable.  Will continue to monitor closely.

## 2023-05-27 ENCOUNTER — Other Ambulatory Visit (HOSPITAL_COMMUNITY): Payer: Self-pay | Admitting: Hematology

## 2023-05-27 ENCOUNTER — Inpatient Hospital Stay (HOSPITAL_BASED_OUTPATIENT_CLINIC_OR_DEPARTMENT_OTHER): Payer: Medicare Other | Admitting: Hematology

## 2023-05-27 VITALS — BP 149/74 | HR 88 | Temp 98.2°F | Resp 18 | Wt 145.1 lb

## 2023-05-27 DIAGNOSIS — Z881 Allergy status to other antibiotic agents status: Secondary | ICD-10-CM | POA: Diagnosis not present

## 2023-05-27 DIAGNOSIS — Z79899 Other long term (current) drug therapy: Secondary | ICD-10-CM | POA: Diagnosis not present

## 2023-05-27 DIAGNOSIS — Z170 Estrogen receptor positive status [ER+]: Secondary | ICD-10-CM | POA: Diagnosis not present

## 2023-05-27 DIAGNOSIS — D631 Anemia in chronic kidney disease: Secondary | ICD-10-CM | POA: Diagnosis not present

## 2023-05-27 DIAGNOSIS — Z1721 Progesterone receptor positive status: Secondary | ICD-10-CM | POA: Diagnosis not present

## 2023-05-27 DIAGNOSIS — Z9889 Other specified postprocedural states: Secondary | ICD-10-CM

## 2023-05-27 DIAGNOSIS — D509 Iron deficiency anemia, unspecified: Secondary | ICD-10-CM | POA: Diagnosis not present

## 2023-05-27 DIAGNOSIS — Z9049 Acquired absence of other specified parts of digestive tract: Secondary | ICD-10-CM | POA: Diagnosis not present

## 2023-05-27 DIAGNOSIS — Z825 Family history of asthma and other chronic lower respiratory diseases: Secondary | ICD-10-CM | POA: Diagnosis not present

## 2023-05-27 DIAGNOSIS — E1122 Type 2 diabetes mellitus with diabetic chronic kidney disease: Secondary | ICD-10-CM | POA: Diagnosis not present

## 2023-05-27 DIAGNOSIS — C50412 Malignant neoplasm of upper-outer quadrant of left female breast: Secondary | ICD-10-CM

## 2023-05-27 DIAGNOSIS — Z1732 Human epidermal growth factor receptor 2 negative status: Secondary | ICD-10-CM | POA: Diagnosis not present

## 2023-05-27 DIAGNOSIS — Z801 Family history of malignant neoplasm of trachea, bronchus and lung: Secondary | ICD-10-CM | POA: Diagnosis not present

## 2023-05-27 DIAGNOSIS — I129 Hypertensive chronic kidney disease with stage 1 through stage 4 chronic kidney disease, or unspecified chronic kidney disease: Secondary | ICD-10-CM | POA: Diagnosis not present

## 2023-05-27 DIAGNOSIS — Z800 Family history of malignant neoplasm of digestive organs: Secondary | ICD-10-CM | POA: Diagnosis not present

## 2023-05-27 DIAGNOSIS — Z833 Family history of diabetes mellitus: Secondary | ICD-10-CM | POA: Diagnosis not present

## 2023-05-27 DIAGNOSIS — N6325 Unspecified lump in the left breast, overlapping quadrants: Secondary | ICD-10-CM | POA: Diagnosis not present

## 2023-05-27 DIAGNOSIS — N183 Chronic kidney disease, stage 3 unspecified: Secondary | ICD-10-CM | POA: Diagnosis not present

## 2023-05-27 DIAGNOSIS — C50812 Malignant neoplasm of overlapping sites of left female breast: Secondary | ICD-10-CM | POA: Diagnosis not present

## 2023-05-27 DIAGNOSIS — E785 Hyperlipidemia, unspecified: Secondary | ICD-10-CM | POA: Diagnosis not present

## 2023-05-27 DIAGNOSIS — Z87442 Personal history of urinary calculi: Secondary | ICD-10-CM | POA: Diagnosis not present

## 2023-05-27 NOTE — Progress Notes (Signed)
[facility] [address] [place] [address]    Clinic Day:  05/27/2023  Referring physician: [name], DO  Patient Care Team: [name], DO as PCP - General (Family Medicine) [name], MD as Medical Oncologist (Medical Oncology) [name], RN as Oncology Nurse Navigator (Medical Oncology) [name], DO (Optometry)   ASSESSMENT & PLAN:   Assessment: 1.  Stage I (T1AN0G1) left breast IDC, ER/PR positive, HER2 negative: - Screening mammogram (10/17/2021): Abnormal. - Diagnostic mammogram/ultrasound (10/23/2021): Irregular hypoechoic mass in the left breast at 3:00 measuring 0.5 x 0.5 x 0.4 cm.  No lymphadenopathy in the left axilla. - Biopsy (11/06/2021): Invasive ductal carcinoma, grade 1, ER 90% strong staining, PR 40%, Ki-67 1%, HER2 1+ - Left lumpectomy on 12/03/2021 - Pathology: Invasive carcinoma, ductal type, grade 1, 4 mm in greatest dimension, margins negative.  ER 90%, PR 40%, HER2 1+, Ki-67 1%.  PT1AP NX. - Anastrozole was prescribed on 01/09/2022.  She was concerned about dizziness as a side effect and did not start it. - She met with radiation oncology who did not recommend radiation. - Tamoxifen 10 mg daily started on 08/20/2022.  2.  Social/family history: - She lives by herself at home.  Daughter is temporarily living with her.  She is independent of ADLs and IADLs.  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  Never smoker. - Father had lung cancer and was smoker.  Paternal aunt had cancer of the female organs.  Brother had throat cancer and was smoker.     Plan: 1.  Stage I (PT1ANx G1) left breast IDC, ER/PR positive, HER2 negative: - She is tolerating tamoxifen very well.  She denies any vaginal bleeding or spotting. - Physical exam: Left breast lumpectomy scar in the outer quadrant is unchanged.  No palpable masses or adenopathy. - Labs on 05/20/2023: Creatinine 1.53 and LFTs are normal.  CBC  grossly normal. - Will arrange for mammogram in July.  RTC 6 months for follow-up with repeat labs and exam.   2.  Bone health (DEXA 11/15/2021 T-score -0.3): - Continue calcium and vitamin D supplements.  Vitamin D level is 51.   3.  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  Last Feraheme on 09/12/2022. - Ferritin is 386 and percent saturation 17.  Hemoglobin 10.9.  No indication for further intervention.    Orders Placed This Encounter  Procedures   MM DIAG BREAST TOMO BILATERAL    Standing Status:   Future    Expected Date:   10/23/2023    Expiration Date:   05/26/2024    Reason for Exam (SYMPTOM  OR DIAGNOSIS REQUIRED):   breast cancer screening    Preferred imaging location?:   [facility]   CBC with Differential    Standing Status:   Future    Expected Date:   10/22/2023    Expiration Date:   05/26/2024   Comprehensive metabolic panel    Standing Status:   Future    Expected Date:   10/22/2023    Expiration Date:   05/26/2024   Iron and TIBC ([facility] ONLY)    Standing Status:   Future    Expected Date:   10/22/2023    Expiration Date:   05/26/2024   Ferritin    Standing Status:   Future    Expected Date:   10/22/2023    Expiration Date:   05/26/2024  Alben Deeds Teague,acting as a Neurosurgeon for [name], MD.,have documented all relevant documentation on the behalf of [name], MD,as directed by  [name], MD while in the presence of [name], MD.  I, [name] MD, have reviewed the above documentation for accuracy and completeness, and I agree with the above.     [name], MD   2/18/20254:25 PM  CHIEF COMPLAINT:   Diagnosis: left breast cancer    Cancer Staging  Breast cancer of upper-outer quadrant of left female breast [facility]) Staging form: Breast, AJCC 8th Edition - Clinical stage from 11/12/2021: Stage IA (cT1a, cN0, cM0, G1, ER+, PR+, HER2-) -  Unsigned    Prior Therapy: Left lumpectomy on 12/03/2021   Current Therapy: Tamoxifen 10 mg daily   HISTORY OF PRESENT ILLNESS:   Oncology History   No history exists.     INTERVAL HISTORY:   Stacy Webb is a 78 y.o. female presenting to clinic today for follow up of left breast cancer. She was last seen by me on 11/20/22.  Since her last visit, she presented to urgent care on 01/17/23 for a lower respiratory infection. She was prescribed Augmentin and Flonase.   Today, she states that she is doing well overall. Her appetite level is at 100%. Her energy level is at 70%. She denies any new onset pains. She is tolerating tamoxifen well and denies any vaginal spotting or bleeding.   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)    Diabetes mellitus without complication (HCC)    Diabetic foot ulcer (HCC) 09/27/2016   Excessive hair growth 07/19/2015   Heart murmur    History of kidney stones    Hyperlipidemia    Hypertension    Neuromuscular disorder (HCC)    diabetic neuropathy   Neuropathy    Postmenopausal 07/19/2015    Surgical History: Past Surgical History:  Procedure Laterality Date   ANTERIOR AND POSTERIOR REPAIR N/A 04/28/2018   Procedure: ANTERIOR (CYSTOCELE) AND POSTERIOR REPAIR (RECTOCELE);  Surgeon: Tilda Burrow, MD;  Location: [facility];  Service: Gynecology;  Laterality: N/A;   APPENDECTOMY     BREAST BIOPSY Left 2023   invasive ca   BREAST LUMPECTOMY Left 2023   invasive ca   CARPAL TUNNEL RELEASE Bilateral 2001   CESAREAN SECTION     COLONOSCOPY N/A 08/28/2012   Procedure: COLONOSCOPY;  Surgeon: Malissa Hippo, MD;  Location: [facility];  Service: Endoscopy;  Laterality: N/A;  915   COLONOSCOPY N/A 12/25/2017   Procedure: COLONOSCOPY;  Surgeon: Malissa Hippo, MD;  Location: [facility];  Service: Endoscopy;   Laterality: N/A;  12:45   REVERSE SHOULDER ARTHROPLASTY Left 07/10/2020   Procedure: REVERSE SHOULDER ARTHROPLASTY;  Surgeon: Oliver Barre, MD;  Location: [facility];  Service: Orthopedics;  Laterality: Left;    Social History: Social History   Socioeconomic History   Marital status: Widowed    Spouse name: Not on file   Number of children: 3   Years of education: Not on file   Highest education level: Not on file  Occupational History   Occupation: [employer]    Comment: Medical Records  Tobacco Use   Smoking status: Never   Smokeless tobacco: Never  Vaping Use   Vaping status: Never Used  Substance and Sexual Activity  Alcohol use: No   Drug use: No   Sexual activity: Not Currently    Birth control/protection: Post-menopausal  Other Topics Concern   Not on file  Social History Narrative   Husband had ALS and passed away.Twin boys, but one died at 34 months and her other son died in 06/18/2014.      Living daughter- lives with pt.   Social Drivers of Corporate investment banker Strain: Low Risk  (11/08/2022)   Overall Financial Resource Strain (CARDIA)    Difficulty of Paying Living Expenses: Not hard at all  Food Insecurity: No Food Insecurity (11/08/2022)   Hunger Vital Sign    Worried About Running Out of Food in the Last Year: Never true    Ran Out of Food in the Last Year: Never true  Transportation Needs: No Transportation Needs (11/08/2022)   PRAPARE - Administrator, Civil Service (Medical): No    Lack of Transportation (Non-Medical): No  Physical Activity: Insufficiently Active (11/08/2022)   Exercise Vital Sign    Days of Exercise per Week: 2 days    Minutes of Exercise per Session: 60 min  Stress: No Stress Concern Present (11/08/2022)   [employer] of Occupational Health - Occupational Stress Questionnaire    Feeling of Stress : Only a little  Social Connections: Moderately Integrated (11/08/2022)   Social Connection and Isolation Panel [NHANES]     Frequency of Communication with Friends and Family: More than three times a week    Frequency of Social Gatherings with Friends and Family: More than three times a week    Attends Religious Services: More than 4 times per year    Active Member of [employer] or Organizations: Yes    Attends Banker Meetings: More than 4 times per year    Marital Status: Widowed  Intimate Partner Violence: Not At Risk (11/08/2022)   Humiliation, Afraid, Rape, and Kick questionnaire    Fear of Current or Ex-Partner: No    Emotionally Abused: No    Physically Abused: No    Sexually Abused: No    Family History: Family History  Problem Relation Age of Onset   Pneumonia Son    Suicidality Son    Diabetes Daughter    Colon cancer Paternal Aunt     Current Medications:  Current Outpatient Medications:    blood glucose meter kit and supplies, Dispense based on patient and insurance preference. Use up to four times daily as directed. (FOR ICD-10 E10.9, E11.9)., Disp: 1 each, Rfl: 0   calcium-vitamin D (OSCAL WITH D) 500-5 MG-MCG tablet, Take 1 tablet by mouth., Disp: , Rfl:    docusate sodium (COLACE) 100 MG capsule, Take 100 mg by mouth at bedtime., Disp: , Rfl:    glimepiride (AMARYL) 1 MG tablet, Take 1 tablet (1 mg total) by mouth daily with breakfast., Disp: 100 tablet, Rfl: 2   glucose blood (ACCU-CHEK GUIDE) test strip, Tests once a day, Disp: 50 each, Rfl: 5   tamoxifen (NOLVADEX) 10 MG tablet, Take 1 tablet (10 mg total) by mouth daily., Disp: 30 tablet, Rfl: 3   valsartan (DIOVAN) 80 MG tablet, Take 1 tablet (80 mg total) by mouth daily., Disp: 100 tablet, Rfl: 3   Allergies: Allergies  Allergen Reactions   Clindamycin/Lincomycin Other (See Comments)    Mouth taste like metal   Enalapril Cough    REVIEW OF SYSTEMS:   Review of Systems  Constitutional:  Negative for chills, fatigue and fever.  HENT:   Negative for lump/mass, mouth sores, nosebleeds, sore throat and trouble  swallowing.   Eyes:  Negative for eye problems.  Respiratory:  Negative for cough and shortness of breath.   Cardiovascular:  Negative for chest pain, leg swelling and palpitations.  Gastrointestinal:  Negative for abdominal pain, constipation, diarrhea, nausea and vomiting.  Genitourinary:  Negative for bladder incontinence, difficulty urinating, dysuria, frequency, hematuria, nocturia and vaginal bleeding.   Musculoskeletal:  Negative for arthralgias, back pain, flank pain, myalgias and neck pain.  Skin:  Negative for itching and rash.  Neurological:  Negative for dizziness, headaches and numbness.  Hematological:  Does not bruise/bleed easily.  Psychiatric/Behavioral:  Negative for depression, sleep disturbance and suicidal ideas. The patient is not nervous/anxious.   All other systems reviewed and are negative.    VITALS:   Blood pressure (!) 149/74, pulse 88, temperature 98.2 F (36.8 C), temperature source Oral, resp. rate 18, weight 145 lb 1 oz (65.8 kg), SpO2 98%.  Wt Readings from Last 3 Encounters:  05/27/23 145 lb 1 oz (65.8 kg)  05/26/23 145 lb (65.8 kg)  05/01/23 143 lb 3.2 oz (65 kg)    Body mass index is 23.41 kg/m.  Performance status (ECOG): 1 - Symptomatic but completely ambulatory  PHYSICAL EXAM:   Physical Exam Vitals and nursing note reviewed. Exam conducted with a chaperone present.  Constitutional:      Appearance: Normal appearance.  Cardiovascular:     Rate and Rhythm: Normal rate and regular rhythm.     Pulses: Normal pulses.     Heart sounds: Normal heart sounds.  Pulmonary:     Effort: Pulmonary effort is normal.     Breath sounds: Normal breath sounds.  Chest:     Comments: +left lumpectomy scar in lateral upper outer quadrant is well healed +no masses palpable +no adenopathy Abdominal:     Palpations: Abdomen is soft. There is no hepatomegaly, splenomegaly or mass.     Tenderness: There is no abdominal tenderness.  Musculoskeletal:      Right lower leg: No edema.     Left lower leg: No edema.  Lymphadenopathy:     Cervical: No cervical adenopathy.     Right cervical: No superficial, deep or posterior cervical adenopathy.    Left cervical: No superficial, deep or posterior cervical adenopathy.     Upper Body:     Right upper body: No supraclavicular or axillary adenopathy.     Left upper body: No supraclavicular or axillary adenopathy.  Neurological:     General: No focal deficit present.     Mental Status: She is alert and oriented to person, place, and time.  Psychiatric:        Mood and Affect: Mood normal.        Behavior: Behavior normal.   Breast Exam Chaperone: Chapman Moss, RN   LABS:      Latest Ref Rng & Units 05/20/2023    9:43 AM 11/15/2022    7:59 AM 08/13/2022    2:50 PM  CBC  WBC 4.0 - 10.5 K/uL 8.5  10.1  9.0   Hemoglobin 12.0 - 15.0 g/dL 16.1  09.6  9.8   Hematocrit 36.0 - 46.0 % 33.4  35.5  29.9   Platelets 150 - 400 K/uL 222  231  257       Latest Ref Rng & Units 05/20/2023    9:43 AM 02/21/2023   11:19 AM 11/15/2022    7:59 AM  CMP  Glucose 70 - 99 mg/dL 865  93  784   BUN 8 - 23 mg/dL 32  25  34   Creatinine 0.44 - 1.00 mg/dL 6.96  2.95  2.84   Sodium 135 - 145 mmol/L 140  143  136   Potassium 3.5 - 5.1 mmol/L 3.8  4.2  3.8   Chloride 98 - 111 mmol/L 106  106  104   CO2 22 - 32 mmol/L 23  24  22    Calcium 8.9 - 10.3 mg/dL 8.9  9.1  8.9   Total Protein 6.5 - 8.1 g/dL 6.8   6.6   Total Bilirubin 0.0 - 1.2 mg/dL 0.6   0.5   Alkaline Phos 38 - 126 U/L 35   46   AST 15 - 41 U/L 26   23   ALT 0 - 44 U/L 18   17      No results found for: "CEA1", "CEA" / No results found for: "CEA1", "CEA" No results found for: "PSA1" No results found for: "XLK440" No results found for: "CAN125"  No results found for: "TOTALPROTELP", "ALBUMINELP", "A1GS", "A2GS", "BETS", "BETA2SER", "GAMS", "MSPIKE", "SPEI" Lab Results  Component Value Date   TIBC 363 05/20/2023   TIBC 353 11/15/2022   TIBC 391  08/13/2022   FERRITIN 386 (H) 05/20/2023   FERRITIN 568 (H) 11/15/2022   FERRITIN 71 08/13/2022   IRONPCTSAT 17 05/20/2023   IRONPCTSAT 20 11/15/2022   IRONPCTSAT 8 (L) 08/13/2022   No results found for: "LDH"   STUDIES:   No results found.

## 2023-05-27 NOTE — Patient Instructions (Addendum)
[facility] Discharge Instructions   You were seen and examined today by Dr. Ellin Saba.  He reviewed the results of your lab work which are normal/stable.   We will see you back in July. We will repeat labs and a mammogram prior to this visit.    Return as scheduled.    Thank you for choosing [facility] at [facility] to provide your oncology and hematology care.  To afford each patient quality time with our provider, please arrive at least 15 minutes before your scheduled appointment time.   If you have a lab appointment with the Cancer Center please come in thru the Main Entrance and check in at the main information desk.  You need to re-schedule your appointment should you arrive 10 or more minutes late.  We strive to give you quality time with our providers, and arriving late affects you and other patients whose appointments are after yours.  Also, if you no show three or more times for appointments you may be dismissed from the clinic at the providers discretion.     Again, thank you for choosing [facility].  Our hope is that these requests will decrease the amount of time that you wait before being seen by our physicians.       _____________________________________________________________  Should you have questions after your visit to [facility], please contact our office at [phone] and follow the prompts.  Our office hours are 8:00 a.m. and 4:30 p.m. Monday - Friday.  Please note that voicemails left after 4:00 p.m. may not be returned until the following business day.  We are closed weekends and major holidays.  You do have access to a nurse 24-7, just call the main number to the clinic [phone] and do not press any options, hold on the line and a nurse will answer the phone.    For prescription refill requests, have your pharmacy contact our office and allow 72 hours.    Due to Covid,  you will need to wear a mask upon entering the hospital. If you do not have a mask, a mask will be given to you at the Main Entrance upon arrival. For doctor visits, patients may have 1 support person age 53 or older with them. For treatment visits, patients can not have anyone with them due to social distancing guidelines and our immunocompromised population.

## 2023-06-09 DIAGNOSIS — M79671 Pain in right foot: Secondary | ICD-10-CM | POA: Diagnosis not present

## 2023-06-09 DIAGNOSIS — M14672 Charcot's joint, left ankle and foot: Secondary | ICD-10-CM | POA: Diagnosis not present

## 2023-06-09 DIAGNOSIS — L97519 Non-pressure chronic ulcer of other part of right foot with unspecified severity: Secondary | ICD-10-CM | POA: Diagnosis not present

## 2023-06-10 ENCOUNTER — Telehealth: Payer: Self-pay | Admitting: Orthopedic Surgery

## 2023-06-10 DIAGNOSIS — E1140 Type 2 diabetes mellitus with diabetic neuropathy, unspecified: Secondary | ICD-10-CM | POA: Diagnosis not present

## 2023-06-10 DIAGNOSIS — M14671 Charcot's joint, right ankle and foot: Secondary | ICD-10-CM | POA: Diagnosis not present

## 2023-06-10 NOTE — Telephone Encounter (Signed)
 Called and let pt know she will be fine to get MRI.

## 2023-06-10 NOTE — Telephone Encounter (Signed)
 Dr. Dallas Schimke pt - spoke w/the pt, she is scheduled for a MRI tomorrow.  She wants to know if there's anything from her shoulder surgery Dr. Salena Saner did that should hinder her from having the MRI.  [phone] or [phone]

## 2023-06-15 DIAGNOSIS — M14672 Charcot's joint, left ankle and foot: Secondary | ICD-10-CM | POA: Diagnosis not present

## 2023-06-18 DIAGNOSIS — L97519 Non-pressure chronic ulcer of other part of right foot with unspecified severity: Secondary | ICD-10-CM | POA: Diagnosis not present

## 2023-06-18 DIAGNOSIS — M14672 Charcot's joint, left ankle and foot: Secondary | ICD-10-CM | POA: Diagnosis not present

## 2023-06-18 DIAGNOSIS — E1140 Type 2 diabetes mellitus with diabetic neuropathy, unspecified: Secondary | ICD-10-CM | POA: Diagnosis not present

## 2023-06-18 DIAGNOSIS — N183 Chronic kidney disease, stage 3 unspecified: Secondary | ICD-10-CM | POA: Diagnosis not present

## 2023-06-25 DIAGNOSIS — L97519 Non-pressure chronic ulcer of other part of right foot with unspecified severity: Secondary | ICD-10-CM | POA: Diagnosis not present

## 2023-06-25 DIAGNOSIS — E1140 Type 2 diabetes mellitus with diabetic neuropathy, unspecified: Secondary | ICD-10-CM | POA: Diagnosis not present

## 2023-07-02 DIAGNOSIS — L97519 Non-pressure chronic ulcer of other part of right foot with unspecified severity: Secondary | ICD-10-CM | POA: Diagnosis not present

## 2023-07-09 DIAGNOSIS — L97519 Non-pressure chronic ulcer of other part of right foot with unspecified severity: Secondary | ICD-10-CM | POA: Diagnosis not present

## 2023-07-23 ENCOUNTER — Telehealth: Payer: Self-pay

## 2023-07-23 DIAGNOSIS — L97519 Non-pressure chronic ulcer of other part of right foot with unspecified severity: Secondary | ICD-10-CM | POA: Diagnosis not present

## 2023-07-23 NOTE — Telephone Encounter (Signed)
 Copied from CRM [id number]. Topic: Clinical - Prescription Issue >> Jul 23, 2023  2:42 PM Elle L wrote: Reason for CRM: Stacy Webb. with [employer], [phone], called on behalf of the patient to request a prior authorization for dual medical equipment for the patient. Their fax number is [phone].

## 2023-07-25 NOTE — Telephone Encounter (Signed)
 The number left for [facility] was not a valid number- contacted patient- Patient stated she is not sure what is about she is seeing orthopedics/wound care for diabetic ulcers but doesn't need any equipment that she is aware of

## 2023-08-06 DIAGNOSIS — L97519 Non-pressure chronic ulcer of other part of right foot with unspecified severity: Secondary | ICD-10-CM | POA: Diagnosis not present

## 2023-08-07 DIAGNOSIS — L110 Acquired keratosis follicularis: Secondary | ICD-10-CM | POA: Diagnosis not present

## 2023-08-07 DIAGNOSIS — M79672 Pain in left foot: Secondary | ICD-10-CM | POA: Diagnosis not present

## 2023-08-07 DIAGNOSIS — I739 Peripheral vascular disease, unspecified: Secondary | ICD-10-CM | POA: Diagnosis not present

## 2023-08-07 DIAGNOSIS — E1140 Type 2 diabetes mellitus with diabetic neuropathy, unspecified: Secondary | ICD-10-CM | POA: Diagnosis not present

## 2023-08-07 DIAGNOSIS — M79671 Pain in right foot: Secondary | ICD-10-CM | POA: Diagnosis not present

## 2023-08-13 ENCOUNTER — Encounter (HOSPITAL_BASED_OUTPATIENT_CLINIC_OR_DEPARTMENT_OTHER): Attending: Internal Medicine | Admitting: Internal Medicine

## 2023-08-13 DIAGNOSIS — C50412 Malignant neoplasm of upper-outer quadrant of left female breast: Secondary | ICD-10-CM | POA: Insufficient documentation

## 2023-08-13 DIAGNOSIS — E11621 Type 2 diabetes mellitus with foot ulcer: Secondary | ICD-10-CM | POA: Insufficient documentation

## 2023-08-13 DIAGNOSIS — L97512 Non-pressure chronic ulcer of other part of right foot with fat layer exposed: Secondary | ICD-10-CM | POA: Insufficient documentation

## 2023-08-13 DIAGNOSIS — E1142 Type 2 diabetes mellitus with diabetic polyneuropathy: Secondary | ICD-10-CM | POA: Insufficient documentation

## 2023-08-22 ENCOUNTER — Encounter (HOSPITAL_BASED_OUTPATIENT_CLINIC_OR_DEPARTMENT_OTHER): Admitting: Internal Medicine

## 2023-08-22 DIAGNOSIS — L97512 Non-pressure chronic ulcer of other part of right foot with fat layer exposed: Secondary | ICD-10-CM | POA: Diagnosis not present

## 2023-08-22 DIAGNOSIS — E11621 Type 2 diabetes mellitus with foot ulcer: Secondary | ICD-10-CM | POA: Diagnosis not present

## 2023-08-22 DIAGNOSIS — C50412 Malignant neoplasm of upper-outer quadrant of left female breast: Secondary | ICD-10-CM | POA: Diagnosis not present

## 2023-08-22 DIAGNOSIS — E1142 Type 2 diabetes mellitus with diabetic polyneuropathy: Secondary | ICD-10-CM | POA: Diagnosis not present

## 2023-08-26 DIAGNOSIS — L97519 Non-pressure chronic ulcer of other part of right foot with unspecified severity: Secondary | ICD-10-CM | POA: Diagnosis not present

## 2023-08-29 ENCOUNTER — Encounter (HOSPITAL_BASED_OUTPATIENT_CLINIC_OR_DEPARTMENT_OTHER): Admitting: Internal Medicine

## 2023-08-29 DIAGNOSIS — E11621 Type 2 diabetes mellitus with foot ulcer: Secondary | ICD-10-CM

## 2023-08-29 DIAGNOSIS — C50412 Malignant neoplasm of upper-outer quadrant of left female breast: Secondary | ICD-10-CM | POA: Diagnosis not present

## 2023-08-29 DIAGNOSIS — E1142 Type 2 diabetes mellitus with diabetic polyneuropathy: Secondary | ICD-10-CM

## 2023-08-29 DIAGNOSIS — L97512 Non-pressure chronic ulcer of other part of right foot with fat layer exposed: Secondary | ICD-10-CM | POA: Diagnosis not present

## 2023-09-04 DIAGNOSIS — E119 Type 2 diabetes mellitus without complications: Secondary | ICD-10-CM | POA: Diagnosis not present

## 2023-09-04 LAB — HM DIABETES EYE EXAM

## 2023-09-05 ENCOUNTER — Encounter (HOSPITAL_BASED_OUTPATIENT_CLINIC_OR_DEPARTMENT_OTHER): Admitting: Internal Medicine

## 2023-09-05 DIAGNOSIS — L97512 Non-pressure chronic ulcer of other part of right foot with fat layer exposed: Secondary | ICD-10-CM

## 2023-09-05 DIAGNOSIS — E1142 Type 2 diabetes mellitus with diabetic polyneuropathy: Secondary | ICD-10-CM

## 2023-09-05 DIAGNOSIS — E11621 Type 2 diabetes mellitus with foot ulcer: Secondary | ICD-10-CM | POA: Diagnosis not present

## 2023-09-05 DIAGNOSIS — C50412 Malignant neoplasm of upper-outer quadrant of left female breast: Secondary | ICD-10-CM | POA: Diagnosis not present

## 2023-09-09 ENCOUNTER — Other Ambulatory Visit: Payer: Self-pay | Admitting: Hematology

## 2023-09-09 DIAGNOSIS — C50412 Malignant neoplasm of upper-outer quadrant of left female breast: Secondary | ICD-10-CM

## 2023-09-09 NOTE — Telephone Encounter (Signed)
 Tamoxifen  refill approved.  Patient is tolerating and is to continue therapy at this time.

## 2023-09-17 DIAGNOSIS — L97519 Non-pressure chronic ulcer of other part of right foot with unspecified severity: Secondary | ICD-10-CM | POA: Diagnosis not present

## 2023-09-19 ENCOUNTER — Encounter (HOSPITAL_BASED_OUTPATIENT_CLINIC_OR_DEPARTMENT_OTHER): Attending: Internal Medicine | Admitting: Internal Medicine

## 2023-09-19 DIAGNOSIS — C50412 Malignant neoplasm of upper-outer quadrant of left female breast: Secondary | ICD-10-CM | POA: Insufficient documentation

## 2023-09-19 DIAGNOSIS — E1142 Type 2 diabetes mellitus with diabetic polyneuropathy: Secondary | ICD-10-CM | POA: Diagnosis not present

## 2023-09-19 DIAGNOSIS — E11621 Type 2 diabetes mellitus with foot ulcer: Secondary | ICD-10-CM | POA: Diagnosis not present

## 2023-09-19 DIAGNOSIS — L97512 Non-pressure chronic ulcer of other part of right foot with fat layer exposed: Secondary | ICD-10-CM | POA: Insufficient documentation

## 2023-10-08 ENCOUNTER — Other Ambulatory Visit: Payer: Self-pay | Admitting: Hematology

## 2023-10-08 DIAGNOSIS — C50412 Malignant neoplasm of upper-outer quadrant of left female breast: Secondary | ICD-10-CM

## 2023-10-08 NOTE — Telephone Encounter (Signed)
 Tamoxifen  refill approved.  Patient is tolerating and is to continue therapy at this time.

## 2023-10-12 ENCOUNTER — Encounter: Payer: Self-pay | Admitting: Hematology

## 2023-10-16 DIAGNOSIS — E1140 Type 2 diabetes mellitus with diabetic neuropathy, unspecified: Secondary | ICD-10-CM | POA: Diagnosis not present

## 2023-10-16 DIAGNOSIS — M79672 Pain in left foot: Secondary | ICD-10-CM | POA: Diagnosis not present

## 2023-10-16 DIAGNOSIS — M79671 Pain in right foot: Secondary | ICD-10-CM | POA: Diagnosis not present

## 2023-10-16 DIAGNOSIS — I739 Peripheral vascular disease, unspecified: Secondary | ICD-10-CM | POA: Diagnosis not present

## 2023-10-16 DIAGNOSIS — L110 Acquired keratosis follicularis: Secondary | ICD-10-CM | POA: Diagnosis not present

## 2023-10-28 ENCOUNTER — Ambulatory Visit (HOSPITAL_COMMUNITY)
Admission: RE | Admit: 2023-10-28 | Discharge: 2023-10-28 | Disposition: A | Discharge status: Home / Self Care | Source: Ambulatory Visit | Attending: Hematology | Admitting: Hematology

## 2023-10-28 DIAGNOSIS — Z9889 Other specified postprocedural states: Secondary | ICD-10-CM | POA: Insufficient documentation

## 2023-10-28 DIAGNOSIS — Z170 Estrogen receptor positive status [ER+]: Secondary | ICD-10-CM | POA: Diagnosis not present

## 2023-10-28 DIAGNOSIS — C50412 Malignant neoplasm of upper-outer quadrant of left female breast: Secondary | ICD-10-CM | POA: Insufficient documentation

## 2023-10-29 ENCOUNTER — Ambulatory Visit: Payer: Medicare Other | Admitting: Family Medicine

## 2023-11-08 ENCOUNTER — Other Ambulatory Visit: Payer: Self-pay | Admitting: Hematology

## 2023-11-08 DIAGNOSIS — Z170 Estrogen receptor positive status [ER+]: Secondary | ICD-10-CM

## 2023-11-10 ENCOUNTER — Encounter: Payer: Self-pay | Admitting: Hematology

## 2023-11-11 ENCOUNTER — Encounter: Payer: Self-pay | Admitting: Hematology

## 2023-11-12 ENCOUNTER — Ambulatory Visit: Admitting: Family Medicine

## 2023-11-12 VITALS — BP 175/74 | HR 78 | Temp 97.3°F | Ht 66.0 in | Wt 141.6 lb

## 2023-11-12 DIAGNOSIS — I10 Essential (primary) hypertension: Secondary | ICD-10-CM

## 2023-11-12 DIAGNOSIS — Z170 Estrogen receptor positive status [ER+]: Secondary | ICD-10-CM | POA: Diagnosis not present

## 2023-11-12 DIAGNOSIS — C50412 Malignant neoplasm of upper-outer quadrant of left female breast: Secondary | ICD-10-CM

## 2023-11-12 DIAGNOSIS — N1832 Chronic kidney disease, stage 3b: Secondary | ICD-10-CM | POA: Diagnosis not present

## 2023-11-12 DIAGNOSIS — E1122 Type 2 diabetes mellitus with diabetic chronic kidney disease: Secondary | ICD-10-CM

## 2023-11-12 DIAGNOSIS — Z7984 Long term (current) use of oral hypoglycemic drugs: Secondary | ICD-10-CM | POA: Diagnosis not present

## 2023-11-12 DIAGNOSIS — R21 Rash and other nonspecific skin eruption: Secondary | ICD-10-CM

## 2023-11-12 DIAGNOSIS — E785 Hyperlipidemia, unspecified: Secondary | ICD-10-CM | POA: Diagnosis not present

## 2023-11-12 MED ORDER — METRONIDAZOLE 0.75 % EX GEL: CUTANEOUS | 3 refills | Status: AC

## 2023-11-12 MED ORDER — TAMOXIFEN CITRATE 10 MG PO TABS: 10.0000 mg | ORAL_TABLET | Freq: Every day | ORAL | 1 refills | Status: DC

## 2023-11-12 NOTE — Patient Instructions (Signed)
 Labs ordered.  Medication sent in.  Monitor BP at home.  Follow up in 3-6 months.

## 2023-11-13 ENCOUNTER — Other Ambulatory Visit: Payer: Self-pay

## 2023-11-13 ENCOUNTER — Other Ambulatory Visit

## 2023-11-13 DIAGNOSIS — I10 Essential (primary) hypertension: Secondary | ICD-10-CM | POA: Diagnosis not present

## 2023-11-13 DIAGNOSIS — E1122 Type 2 diabetes mellitus with diabetic chronic kidney disease: Secondary | ICD-10-CM | POA: Diagnosis not present

## 2023-11-13 DIAGNOSIS — N1832 Chronic kidney disease, stage 3b: Secondary | ICD-10-CM

## 2023-11-13 DIAGNOSIS — R21 Rash and other nonspecific skin eruption: Secondary | ICD-10-CM | POA: Insufficient documentation

## 2023-11-13 LAB — LIPID PANEL

## 2023-11-13 NOTE — Progress Notes (Signed)
 Subjective:  Patient ID: Stacy Webb, female    DOB: 1945/07/20  Age: 78 y.o. MRN: [id number]  CC:  Follow up   HPI:  78 year old female with hypertension, type 2 diabetes with complications including chronic kidney disease and Charcot foot, anxiety, hyperlipidemia, iron deficiency presents for follow-up.  Patient has been seeing wound care center as well as podiatry.  She has had a wound to her right foot on the plantar aspect at the level of the first MTP joint.  The area is now healed but is callused.  She states that she has upcoming follow-up with podiatry.  Patient's blood pressure elevated here today and will repeat.  She is compliant with valsartan .  Advised to recheck at home.  Patient reports that she has recently been out of her medication due to an issue with her mail order pharmacy.  CKD has been stable.  Needs metabolic panel.  Type 2 diabetes has been stable.  Needs A1c.  She is compliant with Amaryl .  No hypoglycemia.  Patient reports that she has been having issues with redness of her face particularly at the nose.  She states that she has previously seen dermatology and was given some cream but this has not improved things.  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)    Breast cancer of upper-outer quadrant of left female breast (HCC) 11/12/2021   Rotator cuff arthropathy of left shoulder 07/10/2020   CKD (chronic kidney disease) stage 3, GFR 30-59 ml/min (HCC) 09/27/2016   Hyperlipidemia 09/27/2016   Essential hypertension, benign 08/11/2012    Social Hx   Social History   Socioeconomic History   Marital status: Widowed    Spouse name: Not on file   Number of  children: 3   Years of education: Not on file   Highest education level: Not on file  Occupational History   Occupation: [employer]    Comment: Medical Records  Tobacco Use   Smoking status: Never   Smokeless tobacco: Never  Vaping Use   Vaping status: Never Used  Substance and Sexual Activity   Alcohol use: No   Drug use: No   Sexual activity: Not Currently    Birth control/protection: Post-menopausal  Other Topics Concern   Not on file  Social History Narrative   Husband had ALS and passed away.Twin boys, but one died at 30 months and her other son died in Nov 27, 2014.      Living daughter- lives with pt.   Social Drivers of Corporate investment banker Strain: Low Risk  (11/08/2022)   Overall Financial Resource Strain (CARDIA)    Difficulty of Paying Living Expenses: Not hard at all  Food Insecurity: No Food Insecurity (11/08/2022)   Hunger Vital Sign    Worried About Running Out of Food in the Last Year: Never true    Ran Out of Food in the Last Year: Never true  Transportation Needs: No Transportation Needs (11/08/2022)   PRAPARE - Administrator, Civil Service (Medical): No    Lack of Transportation (Non-Medical): No  Physical Activity: Insufficiently Active (11/08/2022)   Exercise Vital Sign    Days of Exercise per Week: 2 days  Minutes of Exercise per Session: 60 min  Stress: No Stress Concern Present (11/08/2022)   [employer] of Occupational Health - Occupational Stress Questionnaire    Feeling of Stress : Only a little  Social Connections: Moderately Integrated (11/08/2022)   Social Connection and Isolation Panel    Frequency of Communication with Friends and Family: More than three times a week    Frequency of Social Gatherings with Friends and Family: More than three times a week    Attends Religious Services: More than 4 times per year    Active Member of [employer] or Organizations: Yes    Attends Banker Meetings: More than 4 times per year    Marital  Status: Widowed    Review of Systems Per HPI  Objective:  BP (!) 175/74   Pulse 78   Temp (!) 97.3 F (36.3 C)   Ht 5' 6 (1.676 m)   Wt 141 lb 9.6 oz (64.2 kg)   SpO2 99%   BMI 22.85 kg/m      11/12/2023    2:03 PM 11/12/2023    1:29 PM 11/12/2023    1:24 PM  BP/Weight  Systolic BP 175 186 198  Diastolic BP 74 77 76  Wt. (Lbs)   141.6  BMI   22.85 kg/m2    Physical Exam Constitutional:      General: She is not in acute distress.    Appearance: Normal appearance.  HENT:     Head: Normocephalic and atraumatic.  Cardiovascular:     Rate and Rhythm: Normal rate and regular rhythm.  Pulmonary:     Effort: Pulmonary effort is normal.     Breath sounds: Normal breath sounds.  Abdominal:     General: There is no distension.     Palpations: Abdomen is soft.     Tenderness: There is no abdominal tenderness.  Musculoskeletal:       Feet:  Feet:     Comments: Callus noted at the labeled location.  No current wound.  Right foot with Charcot foot. Neurological:     Mental Status: She is alert.  Psychiatric:        Mood and Affect: Mood normal.        Behavior: Behavior normal.     Lab Results  Component Value Date   WBC 8.5 05/20/2023   HGB 10.9 (L) 05/20/2023   HCT 33.4 (L) 05/20/2023   PLT 222 05/20/2023   GLUCOSE 194 (H) 05/20/2023   CHOL 147 10/30/2022   TRIG 104 10/30/2022   HDL 47 10/30/2022   LDLCALC 81 10/30/2022   ALT 18 05/20/2023   AST 26 05/20/2023   NA 140 05/20/2023   K 3.8 05/20/2023   CL 106 05/20/2023   CREATININE 1.53 (H) 05/20/2023   BUN 32 (H) 05/20/2023   CO2 23 05/20/2023   HGBA1C 6.9 (H) 02/21/2023     Assessment & Plan:  Type 2 diabetes mellitus with stage 3b chronic kidney disease, without long-term current use of insulin  (HCC) Assessment & Plan: Has been labile.  A1c ordered.  Continue Amaryl .  Foot exam performed today.  Orders: -     Microalbumin / creatinine urine ratio -     Hemoglobin A1c  Malignant neoplasm of  upper-outer quadrant of left breast in female, estrogen receptor positive (HCC) -     Tamoxifen  Citrate; Take 1 tablet (10 mg total) by mouth daily.  Dispense: 90 tablet; Refill: 1  Stage 3b chronic kidney disease (HCC) -  CBC -     CMP14+EGFR -     Microalbumin / creatinine urine ratio  Hyperlipidemia, unspecified hyperlipidemia type Assessment & Plan: Lipid panel to assess.  Orders: -     Lipid panel  Essential hypertension, benign Assessment & Plan: BP elevated here today.  Advised to monitor closely at home.  Continue valsartan .  Labs ordered.   Facial rash Assessment & Plan: Suspected rosacea.  Trial of metronidazole .   Other orders -     metroNIDAZOLE ; Apply and rub a thin film twice daily, morning and evening, to entire affected areas after washing.  Dispense: 45 g; Refill: 3    Follow-up: 3 to 6 months  Susen Haskew Bluford DO [facility]

## 2023-11-13 NOTE — Assessment & Plan Note (Signed)
 BP elevated here today.  Advised to monitor closely at home.  Continue valsartan .  Labs ordered.

## 2023-11-13 NOTE — Assessment & Plan Note (Signed)
 Suspected rosacea.  Trial of metronidazole .

## 2023-11-13 NOTE — Assessment & Plan Note (Addendum)
 Has been labile.  A1c ordered.  Continue Amaryl .  Foot exam performed today.

## 2023-11-13 NOTE — Assessment & Plan Note (Signed)
 Lipid panel to assess.

## 2023-11-14 ENCOUNTER — Ambulatory Visit: Payer: Medicare Other

## 2023-11-14 ENCOUNTER — Ambulatory Visit: Payer: Self-pay | Admitting: Family Medicine

## 2023-11-14 VITALS — Ht 66.0 in | Wt 145.0 lb

## 2023-11-14 DIAGNOSIS — Z0000 Encounter for general adult medical examination without abnormal findings: Secondary | ICD-10-CM

## 2023-11-14 LAB — MICROALBUMIN / CREATININE URINE RATIO
Creatinine, Urine: 63.9 mg/dL
Microalb/Creat Ratio: <5 mg/g{creat} (ref 0–29)
Microalbumin, Urine: <3 ug/mL

## 2023-11-14 NOTE — Patient Instructions (Signed)
 Ms. Tillison , Thank you for taking time out of your busy schedule to complete your Annual Wellness Visit with me. I enjoyed our conversation and look forward to speaking with you again next year. I, as well as your care team,  appreciate your ongoing commitment to your health goals. Please review the following plan we discussed and let me know if I can assist you in the future. Your Game plan/ To Do List     Follow up Visits: We will see or speak with you next year for your Next Medicare AWV with our clinical staff Have you seen your provider in the last 6 months (3 months if uncontrolled diabetes)? Yes  Clinician Recommendations:  Aim for 30 minutes of exercise or brisk walking, 6-8 glasses of water , and 5 servings of fruits and vegetables each day.       This is a list of the screenings recommended for you:  Health Maintenance  Topic Date Due   Eye exam for diabetics  04/04/2023   Yearly kidney health urinalysis for diabetes  10/30/2023   Flu Shot  11/07/2023   COVID-19 Vaccine (4 - 2024-25 season) 11/28/2023*   Hemoglobin A1C  05/14/2024   Yearly kidney function blood test for diabetes  11/11/2024   Complete foot exam   11/11/2024   Medicare Annual Wellness Visit  11/13/2024   DEXA scan (bone density measurement)  11/16/2026   DTaP/Tdap/Td vaccine (2 - Td or Tdap) 11/22/2028   Pneumococcal Vaccine for age over 19  Completed   Hepatitis C Screening  Completed   Zoster (Shingles) Vaccine  Completed   Hepatitis B Vaccine  Aged Out   HPV Vaccine  Aged Out   Meningitis B Vaccine  Aged Out   Colon Cancer Screening  Discontinued  *Topic was postponed. The date shown is not the original due date.    Advanced directives: (ACP Link)Information on Advanced Care Planning can be found at [place]  Secretary of [facility] Advance Health Care Directives Advance Health Care Directives. [email or web address]   Advance Care Planning is important because it:  [x]  Makes sure you receive the medical care  that is consistent with your values, goals, and preferences  [x]  It provides guidance to your family and loved ones and reduces their decisional burden about whether or not they are making the right decisions based on your wishes.  Follow the link provided in your after visit summary or read over the paperwork we have mailed to you to help you started getting your Advance Directives in place. If you need assistance in completing these, please reach out to us  so that we can help you!  See attachments for Preventive Care and Fall Prevention Tips.

## 2023-11-14 NOTE — Progress Notes (Signed)
 Subjective:   Stacy Webb is a 78 y.o. who presents for a Medicare Wellness preventive visit.  As a reminder, Annual Wellness Visits don't include a physical exam, and some assessments may be limited, especially if this visit is performed virtually. We may recommend an in-person follow-up visit with your provider if needed.  Visit Complete: Virtual I connected with  Stacy Webb on 11/14/23 by a audio enabled telemedicine application and verified that I am speaking with the correct person using two identifiers.  Patient Location: Home  Provider Location: Home Office  I discussed the limitations of evaluation and management by telemedicine. The patient expressed understanding and agreed to proceed.  Vital Signs: Because this visit was a virtual/telehealth visit, some criteria may be missing or patient reported. Any vitals not documented were not able to be obtained and vitals that have been documented are patient reported.  VideoDeclined- This patient declined Librarian, academic. Therefore the visit was completed with audio only.  Persons Participating in Visit: Patient.  AWV Questionnaire: No: Patient Medicare AWV questionnaire was not completed prior to this visit.  Cardiac Risk Factors include: advanced age (>25men, >28 women);diabetes mellitus;dyslipidemia;hypertension     Objective:    Today's Vitals   11/14/23 0803  Weight: 145 lb (65.8 kg)  Height: 5' 6 (1.676 m)   Body mass index is 23.4 kg/m.     11/14/2023    8:14 AM 05/27/2023    2:56 PM 01/13/2023   11:57 AM 11/20/2022    3:06 PM 11/08/2022    9:30 AM 09/13/2022   11:27 AM 05/21/2022    2:41 PM  Advanced Directives  Does Patient Have a Medical Advance Directive? No Yes Yes Yes Yes Yes Yes  Type of Furniture conservator/restorer;Living will Living will;Healthcare Power of [employer] Power of [place];Living will Healthcare Power of [place];Living will  Healthcare Power of [place];Living will Healthcare Power of [place];Living will  Does patient want to make changes to medical advance directive?   No - Patient declined No - Patient declined  No - Patient declined No - Patient declined  Copy of Healthcare Power of Attorney in Chart?  No - copy requested No - copy requested No - copy requested No - copy requested No - copy requested No - copy requested  Would patient like information on creating a medical advance directive? Yes (MAU/Ambulatory/Procedural Areas - Information given)          Current Medications (verified) Outpatient Encounter Medications as of 11/14/2023  Medication Sig   blood glucose meter kit and supplies Dispense based on patient and insurance preference. Use up to four times daily as directed. (FOR ICD-10 E10.9, E11.9).   calcium -vitamin D  (OSCAL WITH D) 500-5 MG-MCG tablet Take 1 tablet by mouth.   ferrous sulfate 325 (65 FE) MG tablet Take 325 mg by mouth daily with breakfast.   glimepiride  (AMARYL ) 1 MG tablet Take 1 tablet (1 mg total) by mouth daily with breakfast.   glucose blood (ACCU-CHEK GUIDE) test strip Tests once a day   metroNIDAZOLE  (METROGEL ) 0.75 % gel Apply and rub a thin film twice daily, morning and evening, to entire affected areas after washing.   tamoxifen  (NOLVADEX ) 10 MG tablet Take 1 tablet (10 mg total) by mouth daily.   valsartan  (DIOVAN ) 80 MG tablet Take 1 tablet (80 mg total) by mouth daily.   No facility-administered encounter medications on file as of 11/14/2023.    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)    Diabetes mellitus without complication (HCC)    Diabetic foot ulcer (HCC) 09/27/2016   Excessive hair growth 07/19/2015   Heart murmur    History of kidney stones    Hyperlipidemia    Hypertension    Neuromuscular disorder  (HCC)    diabetic neuropathy   Neuropathy    Postmenopausal 07/19/2015   Past Surgical History:  Procedure Laterality Date   ANTERIOR AND POSTERIOR REPAIR N/A 04/28/2018   Procedure: ANTERIOR (CYSTOCELE) AND POSTERIOR REPAIR (RECTOCELE);  Surgeon: Edsel Norleen GAILS, MD;  Location: [facility];  Service: Gynecology;  Laterality: N/A;   APPENDECTOMY     BREAST BIOPSY Left 2021/11/28   invasive ca   BREAST LUMPECTOMY Left 11-28-21   invasive ca   CARPAL TUNNEL RELEASE Bilateral 11/29/99   CESAREAN SECTION     COLONOSCOPY N/A 08/28/2012   Procedure: COLONOSCOPY;  Surgeon: Claudis RAYMOND Rivet, MD;  Location: [facility];  Service: Endoscopy;  Laterality: N/A;  915   COLONOSCOPY N/A 12/25/2017   Procedure: COLONOSCOPY;  Surgeon: Rivet Claudis RAYMOND, MD;  Location: [facility];  Service: Endoscopy;  Laterality: N/A;  12:45   REVERSE SHOULDER ARTHROPLASTY Left 07/10/2020   Procedure: REVERSE SHOULDER ARTHROPLASTY;  Surgeon: Onesimo Oneil LABOR, MD;  Location: [facility];  Service: Orthopedics;  Laterality: Left;   Family History  Problem Relation Age of Onset   Pneumonia Son    Suicidality Son    Diabetes Daughter    Colon cancer Paternal Aunt    Social History   Socioeconomic History   Marital status: Widowed    Spouse name: Not on file   Number of children: 3   Years of education: Not on file   Highest education level: Not on file  Occupational History   Occupation: [employer]    Comment: Medical Records  Tobacco Use   Smoking status: Never   Smokeless tobacco: Never  Vaping Use   Vaping status: Never Used  Substance and Sexual Activity   Alcohol use: No   Drug use: No   Sexual activity: Not Currently    Birth control/protection: Post-menopausal  Other Topics Concern   Not on file  Social History Narrative   Husband had ALS and passed away.Twin boys, but one died at 57 months and her other son died in 11/29/14.      Living daughter- lives with pt.   Social Drivers of Corporate investment banker Strain: Low  Risk  (11/14/2023)   Overall Financial Resource Strain (CARDIA)    Difficulty of Paying Living Expenses: Not hard at all  Food Insecurity: No Food Insecurity (11/14/2023)   Hunger Vital Sign    Worried About Running Out of Food in the Last Year: Never true    Ran Out of Food in the Last Year: Never true  Transportation Needs: No Transportation Needs (11/14/2023)   PRAPARE - Administrator, Civil Service (Medical): No    Lack of Transportation (Non-Medical): No  Physical Activity: Insufficiently Active (11/14/2023)   Exercise Vital Sign    Days of Exercise per Week: 3 days    Minutes of Exercise per Session: 30 min  Stress: No Stress Concern Present (11/14/2023)   [employer] of Occupational Health - Occupational Stress Questionnaire    Feeling of  Stress: Only a little  Social Connections: Moderately Integrated (11/14/2023)   Social Connection and Isolation Panel    Frequency of Communication with Friends and Family: More than three times a week    Frequency of Social Gatherings with Friends and Family: More than three times a week    Attends Religious Services: More than 4 times per year    Active Member of [employer] or Organizations: Yes    Attends Banker Meetings: More than 4 times per year    Marital Status: Widowed    Tobacco Counseling Counseling given: Not Answered    Clinical Intake:  Pre-visit preparation completed: Yes  Pain : No/denies pain  Diabetes: Yes CBG done?: No Did pt. bring in CBG monitor from home?: No  Lab Results  Component Value Date   HGBA1C 6.4 (H) 11/12/2023   HGBA1C 6.9 (H) 02/21/2023   HGBA1C 6.6 (H) 10/30/2022     How often do you need to have someone help you when you read instructions, pamphlets, or other written materials from your doctor or pharmacy?: 1 - Never  Interpreter Needed?: No  Information entered by :: Charmaine Bloodgood LPN   Activities of Daily Living     11/14/2023    8:13 AM  In your present state of  health, do you have any difficulty performing the following activities:  Hearing? 0  Vision? 0  Difficulty concentrating or making decisions? 0  Walking or climbing stairs? 1  Dressing or bathing? 0  Doing errands, shopping? 0  Preparing Food and eating ? N  Using the Toilet? N  In the past six months, have you accidently leaked urine? N  Do you have problems with loss of bowel control? N  Managing your Medications? N  Managing your Finances? N  Housekeeping or managing your Housekeeping? N    Patient Care Team: Cook, [name], DO as PCP - General (Family Medicine) Rogers Hai, MD as Medical Oncologist (Medical Oncology) Celestia Joesph SQUIBB, RN as Oncology Nurse Navigator (Medical Oncology) Darroll Anes, DO (Optometry) Fernande Lamar LABOR, DPM as Referring Physician (Podiatry) Rosan Harlene Fickle, DO as Consulting Physician (Wound Care) Kit Rush, MD as Consulting Physician (Orthopedic Surgery)  I have updated your Care Teams any recent Medical Services you may have received from other providers in the past year.     Assessment:   This is a routine wellness examination for Stacy Webb.  Hearing/Vision screen Hearing Screening - Comments:: Denies hearing difficulties   Vision Screening - Comments:: Wears rx glasses - up to date with routine eye exams with Dr. Darroll    Goals Addressed             This Visit's Progress    Patient Stated   On track    Improve my balance because I'm afraid of falling       Depression Screen     11/14/2023    8:15 AM 11/12/2023    1:27 PM 04/16/2023    2:31 PM 02/07/2023   10:05 AM 11/12/2022   11:36 AM 11/08/2022    9:37 AM 10/29/2022    2:07 PM  PHQ 2/9 Scores  PHQ - 2 Score 0 0 0 0 0 1 0  PHQ- 9 Score 1 1 1 3 1 3 2     Fall Risk     11/14/2023    8:13 AM 11/12/2023    1:27 PM 04/16/2023    2:31 PM 02/07/2023   10:05 AM 11/12/2022   11:23 AM  Fall Risk   Falls in the past year? 0 0 0 1 1  Number falls in past yr: 0  0 0 0   Injury with Fall? 0 0 0 0 1  Risk for fall due to : Impaired mobility;Impaired balance/gait  Other (Comment)    Risk for fall due to: Comment   age    Follow up Falls prevention discussed;Education provided;Falls evaluation completed  Follow up appointment      MEDICARE RISK AT HOME:  Medicare Risk at Home Any stairs in or around the home?: No If so, are there any without handrails?: No Home free of loose throw rugs in walkways, pet beds, electrical cords, etc?: Yes Adequate lighting in your home to reduce risk of falls?: Yes Life alert?: No Use of a cane, [place] or w/c?: Yes Grab bars in the bathroom?: Yes Shower chair or bench in shower?: No Elevated toilet seat or a handicapped toilet?: Yes  TIMED UP AND GO:  Was the test performed?  No  Cognitive Function: Declined/Normal: No cognitive concerns noted by patient or family. Patient alert, oriented, able to answer questions appropriately and recall recent events. No signs of memory loss or confusion.        11/08/2022    9:33 AM 10/02/2021   10:30 AM 09/22/2020   10:02 AM  6CIT Screen  What Year? 0 points 0 points 0 points  What month? 0 points 0 points 0 points  What time? 0 points 0 points 0 points  Count back from 20 0 points 0 points 0 points  Months in reverse 0 points 0 points 0 points  Repeat phrase 0 points 0 points 0 points  Total Score 0 points 0 points 0 points    Immunizations Immunization History  Administered Date(s) Administered   Fluad Quad(high Dose 65+) 12/08/2020, 02/26/2022   Moderna SARS-COV2 Booster Vaccination 02/24/2020   Moderna Sars-Covid-2 Vaccination 05/23/2019, 06/21/2019, 10/18/2020   PNEUMOCOCCAL CONJUGATE-20 03/04/2022   RSV,unspecified 05/03/2022   Tdap 11/23/2018   Zoster Recombinant(Shingrix) 05/14/2022, 07/09/2022    Screening Tests Health Maintenance  Topic Date Due   OPHTHALMOLOGY EXAM  04/04/2023   Diabetic kidney evaluation - Urine ACR  10/30/2023   INFLUENZA VACCINE   11/07/2023   COVID-19 Vaccine (4 - 2024-25 season) 11/28/2023 (Originally 12/08/2022)   HEMOGLOBIN A1C  05/14/2024   Diabetic kidney evaluation - eGFR measurement  11/11/2024   FOOT EXAM  11/11/2024   Medicare Annual Wellness (AWV)  11/13/2024   DEXA SCAN  11/16/2026   DTaP/Tdap/Td (2 - Td or Tdap) 11/22/2028   Pneumococcal Vaccine: 50+ Years  Completed   Hepatitis C Screening  Completed   Zoster Vaccines- Shingrix  Completed   Hepatitis B Vaccines  Aged Out   HPV VACCINES  Aged Out   Meningococcal B Vaccine  Aged Out   Colonoscopy  Discontinued    Health Maintenance  Health Maintenance Due  Topic Date Due   OPHTHALMOLOGY EXAM  04/04/2023   Diabetic kidney evaluation - Urine ACR  10/30/2023   INFLUENZA VACCINE  11/07/2023   Health Maintenance Items Addressed: Requesting records for last diabetic eye exam    Additional Screening:  Vision Screening: Recommended annual ophthalmology exams for early detection of glaucoma and other disorders of the eye. Would you like a referral to an eye doctor? No    Dental Screening: Recommended annual dental exams for proper oral hygiene  Community Resource Referral / Chronic Care Management: CRR required this visit?  No   CCM  required this visit?  No   Plan:    I have personally reviewed and noted the following in the patient's chart:   Medical and social history Use of alcohol, tobacco or illicit drugs  Current medications and supplements including opioid prescriptions. Patient is not currently taking opioid prescriptions. Functional ability and status Nutritional status Physical activity Advanced directives List of other physicians Hospitalizations, surgeries, and ER visits in previous 12 months Vitals Screenings to include cognitive, depression, and falls Referrals and appointments  In addition, I have reviewed and discussed with patient certain preventive protocols, quality metrics, and best practice recommendations. A  written personalized care plan for preventive services as well as general preventive health recommendations were provided to patient.   Stacy Webb [employer], [place]   04/08/7972   After Visit Summary: (MyChart) Due to this being a telephonic visit, the after visit summary with patients personalized plan was offered to patient via MyChart   Notes: Nothing significant to report at this time.

## 2023-11-15 LAB — CBC
Hematocrit: 35.8 % (ref 34.0–46.6)
Hemoglobin: 11.5 g/dL (ref 11.1–15.9)
MCH: 29.2 pg (ref 26.6–33.0)
MCHC: 32.1 g/dL (ref 31.5–35.7)
MCV: 91 fL (ref 79–97)
Platelets: 242 x10E3/uL (ref 150–450)
RBC: 3.94 x10E6/uL (ref 3.77–5.28)
RDW: 13.2 % (ref 11.7–15.4)
WBC: 8.9 x10E3/uL (ref 3.4–10.8)

## 2023-11-15 LAB — LIPID PANEL
Chol/HDL Ratio: 2.6 ratio (ref 0.0–4.4)
Cholesterol, Total: 128 mg/dL (ref 100–199)
HDL: 50 mg/dL (ref >39)
LDL Chol Calc (NIH): 45 mg/dL (ref 0–99)
Triglycerides: 211 mg/dL — ABNORMAL HIGH (ref 0–149)
VLDL Cholesterol Cal: 33 mg/dL (ref 5–40)

## 2023-11-15 LAB — CMP14+EGFR
ALT: 16 IU/L (ref 0–32)
AST: 25 IU/L (ref 0–40)
Albumin: 4.4 g/dL (ref 3.8–4.8)
Alkaline Phosphatase: 55 IU/L (ref 44–121)
BUN/Creatinine Ratio: 17 (ref 12–28)
BUN: 27 mg/dL (ref 8–27)
Bilirubin Total: 0.2 mg/dL (ref 0.0–1.2)
CO2: 20 mmol/L (ref 20–29)
Calcium: 9.4 mg/dL (ref 8.7–10.3)
Chloride: 105 mmol/L (ref 96–106)
Creatinine, Ser: 1.62 mg/dL — ABNORMAL HIGH (ref 0.57–1.00)
Globulin, Total: 2.1 g/dL (ref 1.5–4.5)
Glucose: 125 mg/dL — ABNORMAL HIGH (ref 70–99)
Potassium: 4.4 mmol/L (ref 3.5–5.2)
Sodium: 142 mmol/L (ref 134–144)
Total Protein: 6.5 g/dL (ref 6.0–8.5)
eGFR: 33 mL/min/1.73 — ABNORMAL LOW (ref >59)

## 2023-11-15 LAB — MICROALBUMIN / CREATININE URINE RATIO

## 2023-11-15 LAB — SPECIMEN STATUS REPORT

## 2023-11-15 LAB — HEMOGLOBIN A1C
Est. average glucose Bld gHb Est-mCnc: 137 mg/dL
Hgb A1c MFr Bld: 6.4 % — ABNORMAL HIGH (ref 4.8–5.6)

## 2023-11-16 ENCOUNTER — Ambulatory Visit: Payer: Self-pay | Admitting: Family Medicine

## 2023-11-17 ENCOUNTER — Inpatient Hospital Stay: Attending: Oncology

## 2023-11-17 ENCOUNTER — Inpatient Hospital Stay: Payer: Medicare Other

## 2023-11-17 DIAGNOSIS — N6325 Unspecified lump in the left breast, overlapping quadrants: Secondary | ICD-10-CM | POA: Diagnosis not present

## 2023-11-17 DIAGNOSIS — N1832 Chronic kidney disease, stage 3b: Secondary | ICD-10-CM | POA: Insufficient documentation

## 2023-11-17 DIAGNOSIS — Z79899 Other long term (current) drug therapy: Secondary | ICD-10-CM | POA: Diagnosis not present

## 2023-11-17 DIAGNOSIS — D631 Anemia in chronic kidney disease: Secondary | ICD-10-CM | POA: Diagnosis not present

## 2023-11-17 DIAGNOSIS — Z1721 Progesterone receptor positive status: Secondary | ICD-10-CM | POA: Diagnosis not present

## 2023-11-17 DIAGNOSIS — D509 Iron deficiency anemia, unspecified: Secondary | ICD-10-CM | POA: Diagnosis not present

## 2023-11-17 DIAGNOSIS — Z170 Estrogen receptor positive status [ER+]: Secondary | ICD-10-CM | POA: Diagnosis not present

## 2023-11-17 DIAGNOSIS — C50412 Malignant neoplasm of upper-outer quadrant of left female breast: Secondary | ICD-10-CM | POA: Diagnosis not present

## 2023-11-17 LAB — CBC WITH DIFFERENTIAL/PLATELET
Abs Immature Granulocytes: 0.01 K/uL (ref 0.00–0.07)
Basophils Absolute: 0.1 K/uL (ref 0.0–0.1)
Basophils Relative: 1 %
Eosinophils Absolute: 0.2 K/uL (ref 0.0–0.5)
Eosinophils Relative: 3 %
HCT: 34.9 % — ABNORMAL LOW (ref 36.0–46.0)
Hemoglobin: 11.4 g/dL — ABNORMAL LOW (ref 12.0–15.0)
Immature Granulocytes: 0 %
Lymphocytes Relative: 31 %
Lymphs Abs: 2.3 K/uL (ref 0.7–4.0)
MCH: 28.9 pg (ref 26.0–34.0)
MCHC: 32.7 g/dL (ref 30.0–36.0)
MCV: 88.6 fL (ref 80.0–100.0)
Monocytes Absolute: 1.1 K/uL — ABNORMAL HIGH (ref 0.1–1.0)
Monocytes Relative: 15 %
Neutro Abs: 3.8 K/uL (ref 1.7–7.7)
Neutrophils Relative %: 50 %
Platelets: 215 K/uL (ref 150–400)
RBC: 3.94 MIL/uL (ref 3.87–5.11)
RDW: 13.6 % (ref 11.5–15.5)
WBC: 7.5 K/uL (ref 4.0–10.5)
nRBC: 0 % (ref 0.0–0.2)

## 2023-11-17 LAB — COMPREHENSIVE METABOLIC PANEL WITH GFR
ALT: 18 U/L (ref 0–44)
AST: 27 U/L (ref 15–41)
Albumin: 3.8 g/dL (ref 3.5–5.0)
Alkaline Phosphatase: 39 U/L (ref 38–126)
Anion gap: 9 (ref 5–15)
BUN: 26 mg/dL — ABNORMAL HIGH (ref 8–23)
CO2: 22 mmol/L (ref 22–32)
Calcium: 9.2 mg/dL (ref 8.9–10.3)
Chloride: 111 mmol/L (ref 98–111)
Creatinine, Ser: 1.46 mg/dL — ABNORMAL HIGH (ref 0.44–1.00)
GFR, Estimated: 37 mL/min — ABNORMAL LOW (ref >60)
Glucose, Bld: 98 mg/dL (ref 70–99)
Potassium: 4.4 mmol/L (ref 3.5–5.1)
Sodium: 142 mmol/L (ref 135–145)
Total Bilirubin: 0.5 mg/dL (ref 0.0–1.2)
Total Protein: 6.6 g/dL (ref 6.5–8.1)

## 2023-11-17 LAB — IRON AND TIBC
Iron: 47 ug/dL (ref 28–170)
Saturation Ratios: 13 % (ref 10.4–31.8)
TIBC: 356 ug/dL (ref 250–450)
UIBC: 309 ug/dL

## 2023-11-17 LAB — FERRITIN: Ferritin: 406 ng/mL — ABNORMAL HIGH (ref 11–307)

## 2023-11-18 ENCOUNTER — Ambulatory Visit: Payer: Self-pay | Admitting: *Deleted

## 2023-11-18 NOTE — Telephone Encounter (Signed)
 Copied from CRM [id number]. Topic: Clinical - Red Word Triage >> Nov 18, 2023  8:45 AM Avram MATSU wrote: Red Word that prompted transfer to Nurse Triage: muscle spasm causing pain with like something to help with. Reason for Disposition  [1] MODERATE pain (e.g., interferes with normal activities, limping) AND [2] present > 3 days  Answer Assessment - Initial Assessment Questions 1. ONSET: When did the pain start?      I'm having muscle spasms in my feet and legs.  They get hard as a rock.   Sometimes I can't bend my ankles. 2. LOCATION: Where is the pain located?      Legs and ankles.  He has given me something for muscle spasms before.   My daughter is on crutches and I'm having to do a lot to help her right now.   It's worse at night. 3. PAIN: How bad is the pain?    (Scale 1-10; or mild, moderate, severe)     Severe  4. WORK OR EXERCISE: Has there been any recent work or exercise that involved this part of the body?      See above 5. CAUSE: What do you think is causing the leg pain?     Muscle spasms    I've had them before 6. OTHER SYMPTOMS: Do you have any other symptoms? (e.g., chest pain, back pain, breathing difficulty, swelling, rash, fever, numbness, weakness)     They have been happening for a while now.    The OTC med. Not helping much now. 7. PREGNANCY: Is there any chance you are pregnant? When was your last menstrual period?     N/A  Protocols used: Leg Pain-A-AH FYI Only or Action Required?: FYI only for provider.  Patient was last seen in primary care on 11/12/2023 by Cook, [name], DO.  Called Nurse Triage reporting Leg Pain. Both legs and feet are having muscle spasms real bad especially at night.  Symptoms began several days ago.  Interventions attempted: OTC medications: Hyland pills for muscle spasms but not helping much.  Symptoms are: rapidly worsening.  Triage Disposition: See PCP When Office is Open (Within 3 Days)  Patient/caregiver  understands and will follow disposition?: Yes

## 2023-11-18 NOTE — Telephone Encounter (Signed)
 Appt scheduled

## 2023-11-19 ENCOUNTER — Ambulatory Visit: Admitting: Family Medicine

## 2023-11-25 ENCOUNTER — Inpatient Hospital Stay (HOSPITAL_BASED_OUTPATIENT_CLINIC_OR_DEPARTMENT_OTHER): Admitting: Oncology

## 2023-11-25 ENCOUNTER — Encounter: Payer: Self-pay | Admitting: Oncology

## 2023-11-25 VITALS — BP 163/87 | HR 84 | Temp 98.3°F | Resp 18 | Wt 141.3 lb

## 2023-11-25 DIAGNOSIS — D631 Anemia in chronic kidney disease: Secondary | ICD-10-CM | POA: Diagnosis not present

## 2023-11-25 DIAGNOSIS — Z170 Estrogen receptor positive status [ER+]: Secondary | ICD-10-CM | POA: Diagnosis not present

## 2023-11-25 DIAGNOSIS — C50412 Malignant neoplasm of upper-outer quadrant of left female breast: Secondary | ICD-10-CM | POA: Diagnosis not present

## 2023-11-25 DIAGNOSIS — N1832 Chronic kidney disease, stage 3b: Secondary | ICD-10-CM | POA: Diagnosis not present

## 2023-11-25 DIAGNOSIS — Z1721 Progesterone receptor positive status: Secondary | ICD-10-CM | POA: Diagnosis not present

## 2023-11-25 DIAGNOSIS — Z79899 Other long term (current) drug therapy: Secondary | ICD-10-CM | POA: Diagnosis not present

## 2023-11-25 DIAGNOSIS — D509 Iron deficiency anemia, unspecified: Secondary | ICD-10-CM | POA: Diagnosis not present

## 2023-11-25 DIAGNOSIS — N6325 Unspecified lump in the left breast, overlapping quadrants: Secondary | ICD-10-CM | POA: Diagnosis not present

## 2023-11-25 MED ORDER — TAMOXIFEN CITRATE 10 MG PO TABS: 10.0000 mg | ORAL_TABLET | Freq: Every day | ORAL | 1 refills | Status: DC

## 2023-11-25 NOTE — Assessment & Plan Note (Addendum)
 Continue follow-up with [name].  Creatinine is more or less stable.

## 2023-11-25 NOTE — Progress Notes (Signed)
 [facility] OFFICE PROGRESS NOTE  Webb, Stacy G, DO  ASSESSMENT & PLAN:  Assessment & Plan Malignant neoplasm of upper-outer quadrant of left breast in female, estrogen receptor positive (HCC) - She is tolerating tamoxifen  very well.  She denies any vaginal bleeding or spotting. - Physical exam: Left breast lumpectomy scar in the outer quadrant is unchanged.  No palpable masses or adenopathy. - Labs on 11/17/2023 show hemoglobin of 11.4, ferritin 406 iron saturation 13% with a TIBC of 356.  Creatinine 1.46 with GFR 37.  LFTs are WNL. -Most recent mammogram is from July 2025 which was read as BI-RADS Category 2 benign. -Recommend follow-up in approximately 6 months with labs a few days before and breast exam.   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  She last received IV Feraheme  on 09/06/2022 and 09/13/2022. -Denies any melena, hematochezia or bright red blood per rectum. -Labs from 11/17/2023 show hemoglobin of 11.4, ferritin 406 and iron saturation of 13%. -Ferritin is likely falsely elevated in the setting of an active faction on her foot. -We discussed given she is also very tired, would recommend 2 doses of IV Feraheme . -Will get the scheduled and have her come back in 6 months with labs a few days before.  Stage 3b chronic kidney disease (HCC) Continue follow-up with [name].  Creatinine is more or less stable.    Orders Placed This Encounter  Procedures   CBC with Differential    Standing Status:   Future    Expected Date:   05/27/2024    Expiration Date:   11/24/2024   Comprehensive metabolic panel    Standing Status:   Future    Expected Date:   05/27/2024    Expiration Date:   11/24/2024   Iron and TIBC ([facility] ONLY)    Standing Status:   Future    Expected Date:   05/27/2024    Expiration Date:   11/24/2024   Ferritin    Standing Status:   Future    Expected  Date:   05/27/2024    Expiration Date:   11/24/2024    INTERVAL HISTORY: Patient returns for breast cancer and IDA.   Reports she has been doing well since her last visit.  She has been experiencing a little bit more fatigue than usual.  Appetite is 100% energy levels are 75%.  Denies any pain.  Reports she has been battling a ulcer on her foot and feels like it is trying to come back.  Reports she will make an appointment with the wound center.  She is tolerating tamoxifen  well.  Has any unwanted side effects.  We reviewed cbc, cmp, iron panel  SUMMARY OF HEMATOLOGIC HISTORY:  Stage I (T1AN0G1) left breast IDC, ER/PR positive, HER2 negative: - Screening mammogram (10/17/2021): Abnormal. - Diagnostic mammogram/ultrasound (10/23/2021): Irregular hypoechoic mass in the left breast at 3:00 measuring 0.5 x 0.5 x 0.4 cm.  No lymphadenopathy in the left axilla. - Biopsy (11/06/2021): Invasive ductal carcinoma, grade 1, ER 90% strong staining, PR 40%, Ki-67 1%, HER2 1+ - Left lumpectomy on 12/03/2021 - Pathology: Invasive carcinoma, ductal type, grade 1, 4 mm in greatest dimension, margins negative.  ER 90%, PR 40%, HER2 1+, Ki-67 1%.  PT1AP NX. - Anastrozole  was prescribed on 01/09/2022.  She was concerned about dizziness as a side effect and did not start it. - She met with radiation oncology who did not recommend  radiation. - Tamoxifen  10 mg daily started on 08/20/2022.   No results found for: CBC  Vitals:   11/25/23 1432 11/25/23 1438  BP: (!) 192/77 (!) 163/87  Pulse: 84   Resp: 18   Temp: 98.3 F (36.8 C)   SpO2: 99%    Review of Systems  Constitutional:  Positive for malaise/fatigue.    Physical Exam Constitutional:      Appearance: Normal appearance.  HENT:     Head: Normocephalic and atraumatic.  Eyes:     Pupils: Pupils are equal, round, and reactive to light.  Cardiovascular:     Rate and Rhythm: Normal rate and regular rhythm.     Heart sounds: Normal heart sounds. No  murmur heard. Pulmonary:     Effort: Pulmonary effort is normal.     Breath sounds: Normal breath sounds. No wheezing.  Chest:  Breasts:    Right: Normal. No swelling.     Left: Normal. No swelling.     Comments: No lymphadenopathy or mass identified during breast exam. Abdominal:     General: Bowel sounds are normal. There is no distension.     Palpations: Abdomen is soft.     Tenderness: There is no abdominal tenderness.  Musculoskeletal:        General: Normal range of motion.     Cervical back: Normal range of motion.  Skin:    General: Skin is warm and dry.     Findings: No rash.  Neurological:     Mental Status: She is alert and oriented to person, place, and time.     Gait: Gait is intact.  Psychiatric:        Mood and Affect: Mood and affect normal.        Cognition and Memory: Memory normal.        Judgment: Judgment normal.      I spent 25 minutes dedicated to the care of this patient (face-to-face and non-face-to-face) on the date of the encounter to include what is described in the assessment and plan.,  [name], NP 11/25/2023 3:34 PM

## 2023-11-25 NOTE — Assessment & Plan Note (Addendum)
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  She last received IV Feraheme  on 09/06/2022 and 09/13/2022. -Denies any melena, hematochezia or bright red blood per rectum. -Labs from 11/17/2023 show hemoglobin of 11.4, ferritin 406 and iron saturation of 13%. -Ferritin is likely falsely elevated in the setting of an active faction on her foot. -We discussed given she is also very tired, would recommend 2 doses of IV Feraheme . -Will get the scheduled and have her come back in 6 months with labs a few days before.

## 2023-11-25 NOTE — Assessment & Plan Note (Addendum)
-   She is tolerating tamoxifen  very well.  She denies any vaginal bleeding or spotting. - Physical exam: Left breast lumpectomy scar in the outer quadrant is unchanged.  No palpable masses or adenopathy. - Labs on 11/17/2023 show hemoglobin of 11.4, ferritin 406 iron saturation 13% with a TIBC of 356.  Creatinine 1.46 with GFR 37.  LFTs are WNL. -Most recent mammogram is from July 2025 which was read as BI-RADS Category 2 benign. -Recommend follow-up in approximately 6 months with labs a few days before and breast exam.

## 2023-11-27 ENCOUNTER — Encounter (HOSPITAL_BASED_OUTPATIENT_CLINIC_OR_DEPARTMENT_OTHER): Attending: General Surgery | Admitting: General Surgery

## 2023-11-27 DIAGNOSIS — E11621 Type 2 diabetes mellitus with foot ulcer: Secondary | ICD-10-CM | POA: Diagnosis not present

## 2023-11-27 DIAGNOSIS — E11610 Type 2 diabetes mellitus with diabetic neuropathic arthropathy: Secondary | ICD-10-CM | POA: Insufficient documentation

## 2023-11-27 DIAGNOSIS — N183 Chronic kidney disease, stage 3 unspecified: Secondary | ICD-10-CM | POA: Diagnosis not present

## 2023-11-27 DIAGNOSIS — L97519 Non-pressure chronic ulcer of other part of right foot with unspecified severity: Secondary | ICD-10-CM | POA: Insufficient documentation

## 2023-11-27 DIAGNOSIS — E1122 Type 2 diabetes mellitus with diabetic chronic kidney disease: Secondary | ICD-10-CM | POA: Diagnosis not present

## 2023-11-27 DIAGNOSIS — E1151 Type 2 diabetes mellitus with diabetic peripheral angiopathy without gangrene: Secondary | ICD-10-CM | POA: Diagnosis not present

## 2023-11-27 DIAGNOSIS — L97512 Non-pressure chronic ulcer of other part of right foot with fat layer exposed: Secondary | ICD-10-CM | POA: Diagnosis not present

## 2023-12-03 ENCOUNTER — Inpatient Hospital Stay

## 2023-12-03 VITALS — BP 183/79 | HR 60 | Temp 96.9°F | Resp 18

## 2023-12-03 DIAGNOSIS — Z170 Estrogen receptor positive status [ER+]: Secondary | ICD-10-CM | POA: Diagnosis not present

## 2023-12-03 DIAGNOSIS — Z79899 Other long term (current) drug therapy: Secondary | ICD-10-CM | POA: Diagnosis not present

## 2023-12-03 DIAGNOSIS — D631 Anemia in chronic kidney disease: Secondary | ICD-10-CM | POA: Diagnosis not present

## 2023-12-03 DIAGNOSIS — N1832 Chronic kidney disease, stage 3b: Secondary | ICD-10-CM | POA: Diagnosis not present

## 2023-12-03 DIAGNOSIS — C50412 Malignant neoplasm of upper-outer quadrant of left female breast: Secondary | ICD-10-CM | POA: Diagnosis not present

## 2023-12-03 DIAGNOSIS — I10 Essential (primary) hypertension: Secondary | ICD-10-CM

## 2023-12-03 DIAGNOSIS — N6325 Unspecified lump in the left breast, overlapping quadrants: Secondary | ICD-10-CM | POA: Diagnosis not present

## 2023-12-03 DIAGNOSIS — Z1721 Progesterone receptor positive status: Secondary | ICD-10-CM | POA: Diagnosis not present

## 2023-12-03 DIAGNOSIS — D509 Iron deficiency anemia, unspecified: Secondary | ICD-10-CM | POA: Diagnosis not present

## 2023-12-03 MED ORDER — SODIUM CHLORIDE 0.9 % IV SOLN
510.0000 mg | Freq: Once | INTRAVENOUS | Status: AC
  Administered 2023-12-03: 510 mg via INTRAVENOUS
  Filled 2023-12-03: qty 510

## 2023-12-03 MED ORDER — SODIUM CHLORIDE 0.9 % IV SOLN
Freq: Once | INTRAVENOUS | Status: AC
Start: 2023-12-03 — End: 2023-12-03

## 2023-12-03 MED ORDER — CLONIDINE HCL 0.1 MG PO TABS
0.2000 mg | ORAL_TABLET | Freq: Once | ORAL | Status: AC
  Administered 2023-12-03: 0.2 mg via ORAL
  Filled 2023-12-03: qty 2

## 2023-12-03 NOTE — Patient Instructions (Signed)
 [facility] [place] - A DEPT OF [facility]  Discharge Instructions: Thank you for choosing [facility] to provide your oncology and hematology care.  If you have a lab appointment with the Cancer Center - please note that after April 8th, 2024, all labs will be drawn in the cancer center.  You do not have to check in or register with the main entrance as you have in the past but will complete your check-in in the cancer center.  Wear comfortable clothing and clothing appropriate for easy access to any Portacath or PICC line.   We strive to give you quality time with your provider. You may need to reschedule your appointment if you arrive late (15 or more minutes).  Arriving late affects you and other patients whose appointments are after yours.  Also, if you miss three or more appointments without notifying the office, you may be dismissed from the clinic at the provider's discretion.      For prescription refill requests, have your pharmacy contact our office and allow 72 hours for refills to be completed.    Today you received the following:  Feraheme.  Ferumoxytol Injection What is this medication? FERUMOXYTOL (FER ue MOX i tol) treats low levels of iron in your body (iron deficiency anemia). Iron is a mineral that plays an important role in making red blood cells, which carry oxygen from your lungs to the rest of your body. This medicine may be used for other purposes; ask your health care provider or pharmacist if you have questions. COMMON BRAND NAME(S): Feraheme What should I tell my care team before I take this medication? They need to know if you have any of these conditions: Anemia not caused by low iron levels High levels of iron in the blood Magnetic resonance imaging (MRI) test scheduled An unusual or allergic reaction to iron, other medications, foods, dyes, or preservatives Pregnant or trying to get pregnant Breastfeeding How should I  use this medication? This medication is injected into a vein. It is given by your care team in a hospital or clinic setting. Talk to your care team the use of this medication in children. Special care may be needed. Overdosage: If you think you have taken too much of this medicine contact a poison control center or emergency room at once. NOTE: This medicine is only for you. Do not share this medicine with others. What if I miss a dose? It is important not to miss your dose. Call your care team if you are unable to keep an appointment. What may interact with this medication? Other iron products This list may not describe all possible interactions. Give your health care provider a list of all the medicines, herbs, non-prescription drugs, or dietary supplements you use. Also tell them if you smoke, drink alcohol, or use illegal drugs. Some items may interact with your medicine. What should I watch for while using this medication? Visit your care team for regular checks on your progress. Tell your care team if your symptoms do not start to get better or if they get worse. You may need blood work done while you are taking this medication. You may need to eat more foods that contain iron. Talk to your care team. Foods that contain iron include whole grains or cereals, dried fruits, beans, peas, leafy green vegetables, and organ meats (liver, kidney). What side effects may I notice from receiving this medication? Side effects that you should  report to your care team as soon as possible: Allergic reactions--skin rash, itching, hives, swelling of the face, lips, tongue, or throat Low blood pressure--dizziness, feeling faint or lightheaded, blurry vision Shortness of breath Side effects that usually do not require medical attention (report to your care team if they continue or are bothersome): Flushing Headache Joint pain Muscle pain Nausea Pain, redness, or irritation at injection site This list  may not describe all possible side effects. Call your doctor for medical advice about side effects. You may report side effects to FDA at 1-800-FDA-1088. Where should I keep my medication? This medication is given in a hospital or clinic. It will not be stored at home. NOTE: This sheet is a summary. It may not cover all possible information. If you have questions about this medicine, talk to your doctor, pharmacist, or health care provider.  2024 Elsevier/Gold Standard (2022-11-13 00:00:00)    To help prevent nausea and vomiting after your treatment, we encourage you to take your nausea medication as directed.  BELOW ARE SYMPTOMS THAT SHOULD BE REPORTED IMMEDIATELY: *FEVER GREATER THAN 100.4 F (38 C) OR HIGHER *CHILLS OR SWEATING *NAUSEA AND VOMITING THAT IS NOT CONTROLLED WITH YOUR NAUSEA MEDICATION *UNUSUAL SHORTNESS OF BREATH *UNUSUAL BRUISING OR BLEEDING *URINARY PROBLEMS (pain or burning when urinating, or frequent urination) *BOWEL PROBLEMS (unusual diarrhea, constipation, pain near the anus) TENDERNESS IN MOUTH AND THROAT WITH OR WITHOUT PRESENCE OF ULCERS (sore throat, sores in mouth, or a toothache) UNUSUAL RASH, SWELLING OR PAIN  UNUSUAL VAGINAL DISCHARGE OR ITCHING   Items with * indicate a potential emergency and should be followed up as soon as possible or go to the Emergency Department if any problems should occur.  Please show the CHEMOTHERAPY ALERT CARD or IMMUNOTHERAPY ALERT CARD at check-in to the Emergency Department and triage nurse.  Should you have questions after your visit or need to cancel or reschedule your appointment, please contact [facility] [place] - A DEPT OF [facility] [phone]  and follow the prompts.  Office hours are 8:00 a.m. to 4:30 p.m. Monday - Friday. Please note that voicemails left after 4:00 p.m. may not be returned until the following business day.  We are closed weekends and major holidays. You have access to a  nurse at all times for urgent questions. Please call the main number to the clinic [phone] and follow the prompts.  For any non-urgent questions, you may also contact your provider using MyChart. We now offer e-Visits for anyone 39 and older to request care online for non-urgent symptoms. For details visit [email or web address].   Also download the MyChart app! Go to the app store, search "MyChart", open the app, select [place], and log in with your MyChart username and password.

## 2023-12-03 NOTE — Progress Notes (Signed)
 Iron given today per MD orders. Tolerated infusion without adverse affects. Vital signs stable. BP elevated , patient asymptomatic. Patient teaching performed pertaining to blood pressure management and medication administration. Patient instructed to follow up with her PCP in the morning. Understanding verbalized.  No complaints at this time. Discharged from clinic ambulatory in stable condition. Alert and oriented x 3. F/U with [facility] as scheduled.

## 2023-12-03 NOTE — Progress Notes (Signed)
 Patient presents today for iron infusion.  Patient is in satisfactory condition with no new complaints voiced.  Vital signs are stable.  IV placed in R wrist.  IV flushed well with good blood return noted.  We will proceed with infusion per provider orders.    BP at Feraheme  completion was 203/81.  Patient asymptomatic.  We will give Clonidine  0.2 mg PO x one dose per standing orders.

## 2023-12-05 ENCOUNTER — Encounter (HOSPITAL_BASED_OUTPATIENT_CLINIC_OR_DEPARTMENT_OTHER): Admitting: General Surgery

## 2023-12-05 DIAGNOSIS — L97512 Non-pressure chronic ulcer of other part of right foot with fat layer exposed: Secondary | ICD-10-CM | POA: Diagnosis not present

## 2023-12-05 DIAGNOSIS — L97519 Non-pressure chronic ulcer of other part of right foot with unspecified severity: Secondary | ICD-10-CM | POA: Diagnosis not present

## 2023-12-05 DIAGNOSIS — E11621 Type 2 diabetes mellitus with foot ulcer: Secondary | ICD-10-CM | POA: Diagnosis not present

## 2023-12-05 DIAGNOSIS — E1122 Type 2 diabetes mellitus with diabetic chronic kidney disease: Secondary | ICD-10-CM | POA: Diagnosis not present

## 2023-12-05 DIAGNOSIS — N183 Chronic kidney disease, stage 3 unspecified: Secondary | ICD-10-CM | POA: Diagnosis not present

## 2023-12-05 DIAGNOSIS — E11610 Type 2 diabetes mellitus with diabetic neuropathic arthropathy: Secondary | ICD-10-CM | POA: Diagnosis not present

## 2023-12-05 DIAGNOSIS — E1151 Type 2 diabetes mellitus with diabetic peripheral angiopathy without gangrene: Secondary | ICD-10-CM | POA: Diagnosis not present

## 2023-12-09 ENCOUNTER — Ambulatory Visit (INDEPENDENT_AMBULATORY_CARE_PROVIDER_SITE_OTHER): Admitting: Family Medicine

## 2023-12-09 ENCOUNTER — Encounter: Payer: Self-pay | Admitting: Family Medicine

## 2023-12-09 VITALS — BP 161/76 | HR 77 | Temp 97.0°F | Ht 66.0 in | Wt 139.0 lb

## 2023-12-09 DIAGNOSIS — I10 Essential (primary) hypertension: Secondary | ICD-10-CM

## 2023-12-09 MED ORDER — AMLODIPINE BESYLATE 5 MG PO TABS: 5.0000 mg | ORAL_TABLET | Freq: Every day | ORAL | 3 refills | Status: AC

## 2023-12-09 NOTE — Patient Instructions (Signed)
 Medication as directed.  Follow up in 2 weeks.

## 2023-12-09 NOTE — Assessment & Plan Note (Signed)
 Uncontrolled.  Recent exacerbation.  Adding amlodipine .  Follow-up in 2 weeks.

## 2023-12-09 NOTE — Progress Notes (Signed)
 Subjective:  Patient ID: Stacy [facility], female    DOB: 03-Jul-1945  Age: 78 y.o. MRN: [id number]  CC:   Chief Complaint  Patient presents with   Hospitalization Follow-up   Hypertension    HPI:  78 year old female presents for evaluation of the above.  Recently received an infusion at the cancer center.  Blood pressure was markedly elevated.  She was asymptomatic.  She was advised to follow-up with me.  Patient presents today for evaluation.  BP elevated initially and on repeat.  Patient endorses compliance with valsartan .  Will discuss treatment recommendations today to get her blood pressure down.  Denies chest pain or shortness of breath.  She is otherwise feeling well.  She does note recent stressors.  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)    Breast cancer of upper-outer quadrant of left female breast (HCC) 11/12/2021   Rotator cuff arthropathy of left shoulder 07/10/2020   CKD (chronic kidney disease) stage 3, GFR 30-59 ml/min (HCC) 09/27/2016   Hyperlipidemia 09/27/2016   Essential hypertension, benign 08/11/2012    Social Hx   Social History   Socioeconomic History   Marital status: Widowed    Spouse name: Not on file   Number of children: 3   Years of education: Not on file   Highest education level: Not on file  Occupational History   Occupation: [employer]    Comment: Medical Records  Tobacco Use   Smoking status: Never   Smokeless tobacco: Never  Vaping Use   Vaping status: Never Used  Substance and Sexual Activity   Alcohol use: No   Drug use: No   Sexual activity: Not Currently    Birth control/protection: Post-menopausal  Other Topics Concern   Not on file  Social History Narrative    Husband had ALS and passed away.Twin boys, but one died at 54 months and her other son died in 2015/01/03.      Living daughter- lives with pt.   Social Drivers of Corporate investment banker Strain: Low Risk  (11/14/2023)   Overall Financial Resource Strain (CARDIA)    Difficulty of Paying Living Expenses: Not hard at all  Food Insecurity: No Food Insecurity (11/14/2023)   Hunger Vital Sign    Worried About Running Out of Food in the Last Year: Never true    Ran Out of Food in the Last Year: Never true  Transportation Needs: No Transportation Needs (11/14/2023)   PRAPARE - Administrator, Civil Service (Medical): No    Lack of Transportation (Non-Medical): No  Physical Activity: Insufficiently Active (11/14/2023)   Exercise Vital Sign    Days of Exercise per Week: 3 days    Minutes of Exercise per Session: 30 min  Stress: No Stress Concern Present (11/14/2023)   [employer] of Occupational Health - Occupational Stress Questionnaire    Feeling of Stress: Only a little  Social Connections: Moderately Integrated (11/14/2023)   Social Connection and Isolation Panel    Frequency of Communication with Friends and Family: More than three times a week    Frequency of Social Gatherings with Friends and Family: More than three times a week    Attends Religious Services: More than 4 times per  year    Active Member of Clubs or Organizations: Yes    Attends Banker Meetings: More than 4 times per year    Marital Status: Widowed    Review of Systems Per HPI  Objective:  BP (!) 161/76 (BP Location: Right Arm, Patient Position: Sitting, Cuff Size: Normal)   Pulse 77   Temp (!) 97 F (36.1 C)   Ht 5' 6 (1.676 m)   Wt 139 lb (63 kg)   SpO2 99%   BMI 22.44 kg/m      12/09/2023   10:19 AM 12/09/2023    9:53 AM 12/09/2023    9:51 AM  BP/Weight  Systolic BP 161 182 169  Diastolic BP 76 72 76  Wt. (Lbs)   139  BMI   22.44 kg/m2    Physical Exam Vitals and nursing note  reviewed.  Constitutional:      General: She is not in acute distress.    Appearance: Normal appearance.  HENT:     Head: Normocephalic and atraumatic.  Cardiovascular:     Rate and Rhythm: Normal rate and regular rhythm.  Pulmonary:     Effort: Pulmonary effort is normal.     Breath sounds: Normal breath sounds.  Neurological:     Mental Status: She is alert.  Psychiatric:        Mood and Affect: Mood normal.        Behavior: Behavior normal.     Lab Results  Component Value Date   WBC 7.5 11/17/2023   HGB 11.4 (L) 11/17/2023   HCT 34.9 (L) 11/17/2023   PLT 215 11/17/2023   GLUCOSE 98 11/17/2023   CHOL 128 11/12/2023   TRIG 211 (H) 11/12/2023   HDL 50 11/12/2023   LDLCALC 45 11/12/2023   ALT 18 11/17/2023   AST 27 11/17/2023   NA 142 11/17/2023   K 4.4 11/17/2023   CL 111 11/17/2023   CREATININE 1.46 (H) 11/17/2023   BUN 26 (H) 11/17/2023   CO2 22 11/17/2023   HGBA1C 6.4 (H) 11/12/2023     Assessment & Plan:  Essential hypertension, benign Assessment & Plan: Uncontrolled.  Recent exacerbation.  Adding amlodipine .  Follow-up in 2 weeks.  Orders: -     amLODIPine  Besylate; Take 1 tablet (5 mg total) by mouth daily.  Dispense: 90 tablet; Refill: 3    Follow-up:  Return in about 2 weeks (around 12/23/2023) for HTN follow up.  Jacqulyn Ahle DO [facility]

## 2023-12-10 ENCOUNTER — Inpatient Hospital Stay: Attending: Oncology

## 2023-12-10 VITALS — BP 126/57 | HR 80 | Temp 98.5°F | Resp 18

## 2023-12-10 DIAGNOSIS — C50412 Malignant neoplasm of upper-outer quadrant of left female breast: Secondary | ICD-10-CM | POA: Insufficient documentation

## 2023-12-10 DIAGNOSIS — N1832 Chronic kidney disease, stage 3b: Secondary | ICD-10-CM | POA: Diagnosis not present

## 2023-12-10 DIAGNOSIS — Z79899 Other long term (current) drug therapy: Secondary | ICD-10-CM | POA: Insufficient documentation

## 2023-12-10 DIAGNOSIS — D509 Iron deficiency anemia, unspecified: Secondary | ICD-10-CM | POA: Diagnosis not present

## 2023-12-10 DIAGNOSIS — Z170 Estrogen receptor positive status [ER+]: Secondary | ICD-10-CM | POA: Diagnosis not present

## 2023-12-10 MED ORDER — SODIUM CHLORIDE 0.9 % IV SOLN: Freq: Once | INTRAVENOUS | Status: AC

## 2023-12-10 MED ORDER — SODIUM CHLORIDE 0.9 % IV SOLN
510.0000 mg | Freq: Once | INTRAVENOUS | Status: AC
  Administered 2023-12-10: 510 mg via INTRAVENOUS
  Filled 2023-12-10: qty 510

## 2023-12-10 NOTE — Patient Instructions (Signed)

## 2023-12-10 NOTE — Progress Notes (Signed)
 Patient tolerated iron infusion with no complaints voiced.  Peripheral IV site clean and dry with good blood return noted before and after infusion.  Band aid applied.  VSS with discharge and left in satisfactory condition with no s/s of distress noted.

## 2023-12-11 ENCOUNTER — Encounter (HOSPITAL_BASED_OUTPATIENT_CLINIC_OR_DEPARTMENT_OTHER): Attending: General Surgery | Admitting: General Surgery

## 2023-12-11 ENCOUNTER — Ambulatory Visit (HOSPITAL_BASED_OUTPATIENT_CLINIC_OR_DEPARTMENT_OTHER): Admitting: Internal Medicine

## 2023-12-11 DIAGNOSIS — E11621 Type 2 diabetes mellitus with foot ulcer: Secondary | ICD-10-CM | POA: Diagnosis not present

## 2023-12-11 DIAGNOSIS — N183 Chronic kidney disease, stage 3 unspecified: Secondary | ICD-10-CM | POA: Insufficient documentation

## 2023-12-11 DIAGNOSIS — E1122 Type 2 diabetes mellitus with diabetic chronic kidney disease: Secondary | ICD-10-CM | POA: Diagnosis not present

## 2023-12-11 DIAGNOSIS — L97519 Non-pressure chronic ulcer of other part of right foot with unspecified severity: Secondary | ICD-10-CM | POA: Diagnosis not present

## 2023-12-11 DIAGNOSIS — L97512 Non-pressure chronic ulcer of other part of right foot with fat layer exposed: Secondary | ICD-10-CM | POA: Diagnosis not present

## 2023-12-11 DIAGNOSIS — E1151 Type 2 diabetes mellitus with diabetic peripheral angiopathy without gangrene: Secondary | ICD-10-CM | POA: Diagnosis not present

## 2023-12-11 DIAGNOSIS — Z7984 Long term (current) use of oral hypoglycemic drugs: Secondary | ICD-10-CM | POA: Diagnosis not present

## 2023-12-15 ENCOUNTER — Other Ambulatory Visit (HOSPITAL_COMMUNITY): Payer: Self-pay | Admitting: General Surgery

## 2023-12-15 ENCOUNTER — Ambulatory Visit (HOSPITAL_COMMUNITY)
Admission: RE | Admit: 2023-12-15 | Discharge: 2023-12-15 | Disposition: A | Discharge status: Home / Self Care | Source: Ambulatory Visit | Attending: Surgery | Admitting: Surgery

## 2023-12-15 ENCOUNTER — Ambulatory Visit (HOSPITAL_BASED_OUTPATIENT_CLINIC_OR_DEPARTMENT_OTHER)
Admission: RE | Admit: 2023-12-15 | Discharge: 2023-12-15 | Disposition: A | Discharge status: Home / Self Care | Source: Ambulatory Visit | Attending: Surgery | Admitting: Surgery

## 2023-12-15 DIAGNOSIS — I739 Peripheral vascular disease, unspecified: Secondary | ICD-10-CM | POA: Insufficient documentation

## 2023-12-16 LAB — VAS US ABI WITH/WO TBI
Left ABI: 1.28
Right ABI: 1.26

## 2023-12-18 ENCOUNTER — Ambulatory Visit (HOSPITAL_BASED_OUTPATIENT_CLINIC_OR_DEPARTMENT_OTHER): Admitting: Internal Medicine

## 2023-12-18 DIAGNOSIS — N183 Chronic kidney disease, stage 3 unspecified: Secondary | ICD-10-CM | POA: Diagnosis not present

## 2023-12-18 DIAGNOSIS — E11621 Type 2 diabetes mellitus with foot ulcer: Secondary | ICD-10-CM | POA: Diagnosis not present

## 2023-12-18 DIAGNOSIS — L97519 Non-pressure chronic ulcer of other part of right foot with unspecified severity: Secondary | ICD-10-CM

## 2023-12-18 DIAGNOSIS — Z7984 Long term (current) use of oral hypoglycemic drugs: Secondary | ICD-10-CM | POA: Diagnosis not present

## 2023-12-18 DIAGNOSIS — E1151 Type 2 diabetes mellitus with diabetic peripheral angiopathy without gangrene: Secondary | ICD-10-CM | POA: Diagnosis not present

## 2023-12-18 DIAGNOSIS — E1122 Type 2 diabetes mellitus with diabetic chronic kidney disease: Secondary | ICD-10-CM | POA: Diagnosis not present

## 2023-12-24 ENCOUNTER — Encounter: Payer: Self-pay | Admitting: Family Medicine

## 2023-12-24 ENCOUNTER — Ambulatory Visit (INDEPENDENT_AMBULATORY_CARE_PROVIDER_SITE_OTHER): Admitting: Family Medicine

## 2023-12-24 VITALS — BP 108/66 | HR 82 | Temp 97.3°F | Ht 66.0 in | Wt 142.0 lb

## 2023-12-24 DIAGNOSIS — I10 Essential (primary) hypertension: Secondary | ICD-10-CM | POA: Diagnosis not present

## 2023-12-24 DIAGNOSIS — Z23 Encounter for immunization: Secondary | ICD-10-CM

## 2023-12-24 NOTE — Assessment & Plan Note (Signed)
 Now at goal.  Continue valsartan  and amlodipine .

## 2023-12-24 NOTE — Patient Instructions (Addendum)
 Continue your medications. I'll see you in February.  Take care  Dr. Bluford

## 2023-12-24 NOTE — Progress Notes (Signed)
 Subjective:  Patient ID: Stacy Webb, female    DOB: October 16, 1945  Age: 78 y.o. MRN: [id number]  CC:   Chief Complaint  Patient presents with   Hypertension    HPI:  78 year old female presents for follow-up regarding hypertension.  BP was initially elevated here today but was improved on repeat.  She has had a marked reduction in her blood pressure.  She is compliant with amlodipine  and valsartan .  She is otherwise feeling well.  No other complaints or concerns at this time.  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)    Breast cancer of upper-outer quadrant of left female breast (HCC) 11/12/2021   Rotator cuff arthropathy of left shoulder 07/10/2020   CKD (chronic kidney disease) stage 3, GFR 30-59 ml/min (HCC) 09/27/2016   Hyperlipidemia 09/27/2016   Essential hypertension, benign 08/11/2012    Social Hx   Social History   Socioeconomic History   Marital status: Widowed    Spouse name: Not on file   Number of children: 3   Years of education: Not on file   Highest education level: Not on file  Occupational History   Occupation: [employer]    Comment: Medical Records  Tobacco Use   Smoking status: Never   Smokeless tobacco: Never  Vaping Use   Vaping status: Never Used  Substance and Sexual Activity   Alcohol use: No   Drug use: No   Sexual activity: Not Currently    Birth control/protection: Post-menopausal  Other Topics Concern   Not on file  Social History Narrative   Husband had ALS and passed away.Twin boys, but one died at 26 months and her other son died in 01/03/2015.      Living daughter- lives with pt.   Social Drivers of Corporate investment banker Strain: Low Risk  (11/14/2023)   Overall  Financial Resource Strain (CARDIA)    Difficulty of Paying Living Expenses: Not hard at all  Food Insecurity: No Food Insecurity (11/14/2023)   Hunger Vital Sign    Worried About Running Out of Food in the Last Year: Never true    Ran Out of Food in the Last Year: Never true  Transportation Needs: No Transportation Needs (11/14/2023)   PRAPARE - Administrator, Civil Service (Medical): No    Lack of Transportation (Non-Medical): No  Physical Activity: Insufficiently Active (11/14/2023)   Exercise Vital Sign    Days of Exercise per Week: 3 days    Minutes of Exercise per Session: 30 min  Stress: No Stress Concern Present (11/14/2023)   [employer] of Occupational Health - Occupational Stress Questionnaire    Feeling of Stress: Only a little  Social Connections: Moderately Integrated (11/14/2023)   Social Connection and Isolation Panel    Frequency of Communication with Friends and Family: More than three times a week    Frequency of Social Gatherings with Friends and Family: More than three times a week    Attends Religious Services: More than 4 times per year    Active Member of [employer] or Organizations: Yes    Attends Banker Meetings: More than 4 times per year    Marital Status: Widowed    Review of Systems  Per HPI  Objective:  BP 108/66   Pulse 82   Temp (!) 97.3 F (36.3 C)   Ht 5' 6 (1.676 m)   Wt 142 lb (64.4 kg)   SpO2 98%   BMI 22.92 kg/m      12/24/2023   10:00 AM 12/24/2023    9:37 AM 12/10/2023    2:27 PM  BP/Weight  Systolic BP 108 156 126  Diastolic BP 66 75 57  Wt. (Lbs)  142   BMI  22.92 kg/m2     Physical Exam Vitals and nursing note reviewed.  Constitutional:      General: She is not in acute distress.    Appearance: Normal appearance.  HENT:     Head: Normocephalic and atraumatic.  Cardiovascular:     Rate and Rhythm: Normal rate and regular rhythm.  Pulmonary:     Effort: Pulmonary effort is normal.     Breath sounds:  Normal breath sounds.  Neurological:     Mental Status: She is alert.     Lab Results  Component Value Date   WBC 7.5 11/17/2023   HGB 11.4 (L) 11/17/2023   HCT 34.9 (L) 11/17/2023   PLT 215 11/17/2023   GLUCOSE 98 11/17/2023   CHOL 128 11/12/2023   TRIG 211 (H) 11/12/2023   HDL 50 11/12/2023   LDLCALC 45 11/12/2023   ALT 18 11/17/2023   AST 27 11/17/2023   NA 142 11/17/2023   K 4.4 11/17/2023   CL 111 11/17/2023   CREATININE 1.46 (H) 11/17/2023   BUN 26 (H) 11/17/2023   CO2 22 11/17/2023   HGBA1C 6.4 (H) 11/12/2023     Assessment & Plan:  Essential hypertension, benign Assessment & Plan: Now at goal.  Continue valsartan  and amlodipine .   Immunization due -     Flu vaccine HIGH DOSE PF(Fluzone Trivalent)    Follow-up: Has follow-up in February  Jacqulyn Ahle DO [facility]

## 2023-12-25 ENCOUNTER — Encounter (HOSPITAL_BASED_OUTPATIENT_CLINIC_OR_DEPARTMENT_OTHER): Admitting: Internal Medicine

## 2023-12-25 DIAGNOSIS — E1122 Type 2 diabetes mellitus with diabetic chronic kidney disease: Secondary | ICD-10-CM | POA: Diagnosis not present

## 2023-12-25 DIAGNOSIS — E11621 Type 2 diabetes mellitus with foot ulcer: Secondary | ICD-10-CM

## 2023-12-25 DIAGNOSIS — L97519 Non-pressure chronic ulcer of other part of right foot with unspecified severity: Secondary | ICD-10-CM | POA: Diagnosis not present

## 2023-12-25 DIAGNOSIS — N183 Chronic kidney disease, stage 3 unspecified: Secondary | ICD-10-CM | POA: Diagnosis not present

## 2023-12-25 DIAGNOSIS — Z7984 Long term (current) use of oral hypoglycemic drugs: Secondary | ICD-10-CM | POA: Diagnosis not present

## 2023-12-25 DIAGNOSIS — E1151 Type 2 diabetes mellitus with diabetic peripheral angiopathy without gangrene: Secondary | ICD-10-CM | POA: Diagnosis not present

## 2024-01-01 ENCOUNTER — Encounter (HOSPITAL_BASED_OUTPATIENT_CLINIC_OR_DEPARTMENT_OTHER): Admitting: Internal Medicine

## 2024-01-01 DIAGNOSIS — E11621 Type 2 diabetes mellitus with foot ulcer: Secondary | ICD-10-CM

## 2024-01-01 DIAGNOSIS — E1151 Type 2 diabetes mellitus with diabetic peripheral angiopathy without gangrene: Secondary | ICD-10-CM | POA: Diagnosis not present

## 2024-01-01 DIAGNOSIS — L97519 Non-pressure chronic ulcer of other part of right foot with unspecified severity: Secondary | ICD-10-CM | POA: Diagnosis not present

## 2024-01-01 DIAGNOSIS — E1122 Type 2 diabetes mellitus with diabetic chronic kidney disease: Secondary | ICD-10-CM | POA: Diagnosis not present

## 2024-01-01 DIAGNOSIS — Z7984 Long term (current) use of oral hypoglycemic drugs: Secondary | ICD-10-CM | POA: Diagnosis not present

## 2024-01-01 DIAGNOSIS — N183 Chronic kidney disease, stage 3 unspecified: Secondary | ICD-10-CM | POA: Diagnosis not present

## 2024-01-05 DIAGNOSIS — I739 Peripheral vascular disease, unspecified: Secondary | ICD-10-CM | POA: Diagnosis not present

## 2024-01-05 DIAGNOSIS — M79672 Pain in left foot: Secondary | ICD-10-CM | POA: Diagnosis not present

## 2024-01-05 DIAGNOSIS — M79671 Pain in right foot: Secondary | ICD-10-CM | POA: Diagnosis not present

## 2024-01-05 DIAGNOSIS — L110 Acquired keratosis follicularis: Secondary | ICD-10-CM | POA: Diagnosis not present

## 2024-01-05 DIAGNOSIS — E1140 Type 2 diabetes mellitus with diabetic neuropathy, unspecified: Secondary | ICD-10-CM | POA: Diagnosis not present

## 2024-01-08 ENCOUNTER — Encounter (HOSPITAL_BASED_OUTPATIENT_CLINIC_OR_DEPARTMENT_OTHER): Attending: Internal Medicine | Admitting: Internal Medicine

## 2024-01-08 DIAGNOSIS — E1122 Type 2 diabetes mellitus with diabetic chronic kidney disease: Secondary | ICD-10-CM | POA: Insufficient documentation

## 2024-01-08 DIAGNOSIS — E11610 Type 2 diabetes mellitus with diabetic neuropathic arthropathy: Secondary | ICD-10-CM | POA: Insufficient documentation

## 2024-01-08 DIAGNOSIS — L97519 Non-pressure chronic ulcer of other part of right foot with unspecified severity: Secondary | ICD-10-CM | POA: Insufficient documentation

## 2024-01-08 DIAGNOSIS — E1151 Type 2 diabetes mellitus with diabetic peripheral angiopathy without gangrene: Secondary | ICD-10-CM | POA: Diagnosis not present

## 2024-01-08 DIAGNOSIS — E11621 Type 2 diabetes mellitus with foot ulcer: Secondary | ICD-10-CM | POA: Insufficient documentation

## 2024-01-08 DIAGNOSIS — N183 Chronic kidney disease, stage 3 unspecified: Secondary | ICD-10-CM | POA: Diagnosis not present

## 2024-01-15 ENCOUNTER — Other Ambulatory Visit: Payer: Self-pay | Admitting: Family Medicine

## 2024-01-15 ENCOUNTER — Encounter (HOSPITAL_BASED_OUTPATIENT_CLINIC_OR_DEPARTMENT_OTHER): Admitting: Internal Medicine

## 2024-01-15 DIAGNOSIS — E11621 Type 2 diabetes mellitus with foot ulcer: Secondary | ICD-10-CM | POA: Diagnosis not present

## 2024-01-15 DIAGNOSIS — L97519 Non-pressure chronic ulcer of other part of right foot with unspecified severity: Secondary | ICD-10-CM

## 2024-01-22 ENCOUNTER — Encounter (HOSPITAL_BASED_OUTPATIENT_CLINIC_OR_DEPARTMENT_OTHER): Admitting: Internal Medicine

## 2024-01-22 DIAGNOSIS — L97519 Non-pressure chronic ulcer of other part of right foot with unspecified severity: Secondary | ICD-10-CM

## 2024-01-22 DIAGNOSIS — E11621 Type 2 diabetes mellitus with foot ulcer: Secondary | ICD-10-CM | POA: Diagnosis not present

## 2024-01-29 ENCOUNTER — Encounter (HOSPITAL_BASED_OUTPATIENT_CLINIC_OR_DEPARTMENT_OTHER): Admitting: Internal Medicine

## 2024-01-29 DIAGNOSIS — L97519 Non-pressure chronic ulcer of other part of right foot with unspecified severity: Secondary | ICD-10-CM

## 2024-01-29 DIAGNOSIS — E11621 Type 2 diabetes mellitus with foot ulcer: Secondary | ICD-10-CM

## 2024-02-05 ENCOUNTER — Other Ambulatory Visit: Payer: Self-pay | Admitting: *Deleted

## 2024-02-05 DIAGNOSIS — Z170 Estrogen receptor positive status [ER+]: Secondary | ICD-10-CM

## 2024-02-05 MED ORDER — TAMOXIFEN CITRATE 10 MG PO TABS: 10.0000 mg | ORAL_TABLET | Freq: Every day | ORAL | 0 refills | Status: AC

## 2024-02-08 ENCOUNTER — Other Ambulatory Visit: Payer: Self-pay | Admitting: Oncology

## 2024-02-08 DIAGNOSIS — C50412 Malignant neoplasm of upper-outer quadrant of left female breast: Secondary | ICD-10-CM

## 2024-03-08 ENCOUNTER — Other Ambulatory Visit: Payer: Self-pay | Admitting: Family Medicine

## 2024-03-08 DIAGNOSIS — N1832 Chronic kidney disease, stage 3b: Secondary | ICD-10-CM

## 2024-03-27 ENCOUNTER — Encounter (HOSPITAL_COMMUNITY): Payer: Self-pay

## 2024-03-27 ENCOUNTER — Emergency Department (HOSPITAL_COMMUNITY)

## 2024-03-27 ENCOUNTER — Emergency Department (HOSPITAL_COMMUNITY)
Admission: EM | Admit: 2024-03-27 | Discharge: 2024-03-27 | Disposition: A | Discharge status: Home / Self Care | Attending: Emergency Medicine | Admitting: Emergency Medicine

## 2024-03-27 ENCOUNTER — Other Ambulatory Visit: Payer: Self-pay

## 2024-03-27 DIAGNOSIS — X58XXXA Exposure to other specified factors, initial encounter: Secondary | ICD-10-CM | POA: Diagnosis not present

## 2024-03-27 DIAGNOSIS — E1140 Type 2 diabetes mellitus with diabetic neuropathy, unspecified: Secondary | ICD-10-CM | POA: Diagnosis not present

## 2024-03-27 DIAGNOSIS — I10 Essential (primary) hypertension: Secondary | ICD-10-CM | POA: Diagnosis not present

## 2024-03-27 DIAGNOSIS — S56911A Strain of unspecified muscles, fascia and tendons at forearm level, right arm, initial encounter: Secondary | ICD-10-CM | POA: Insufficient documentation

## 2024-03-27 DIAGNOSIS — S59901A Unspecified injury of right elbow, initial encounter: Secondary | ICD-10-CM | POA: Diagnosis present

## 2024-03-27 DIAGNOSIS — Z87442 Personal history of urinary calculi: Secondary | ICD-10-CM | POA: Insufficient documentation

## 2024-03-27 DIAGNOSIS — Z79899 Other long term (current) drug therapy: Secondary | ICD-10-CM | POA: Diagnosis not present

## 2024-03-27 MED ORDER — KETOROLAC TROMETHAMINE 30 MG/ML IJ SOLN
30.0000 mg | Freq: Once | INTRAMUSCULAR | Status: AC
  Administered 2024-03-27: 30 mg via INTRAMUSCULAR
  Filled 2024-03-27: qty 1

## 2024-03-27 MED ORDER — DEXAMETHASONE SOD PHOSPHATE PF 10 MG/ML IJ SOLN
10.0000 mg | Freq: Once | INTRAMUSCULAR | Status: AC
  Administered 2024-03-27: 10 mg via INTRAMUSCULAR

## 2024-03-27 MED ORDER — CELECOXIB 200 MG PO CAPS: 200.0000 mg | ORAL_CAPSULE | Freq: Two times a day (BID) | ORAL | 0 refills | Status: AC

## 2024-03-27 MED ORDER — HYDROCODONE-ACETAMINOPHEN 5-325 MG PO TABS: 1.0000 | ORAL_TABLET | ORAL | 0 refills | Status: AC | PRN

## 2024-03-27 NOTE — ED Provider Notes (Signed)
 " [place] EMERGENCY DEPARTMENT AT [facility] Provider Note   CSN: [id number] Arrival date & time: 03/27/24  9188     Patient presents with: Elbow Problem   Stacy Webb is a 78 y.o. female.   Pt is a 78 yo female with pmhx significant for DM, HTN, HLD, arthritis, kidney stones, diabetic neuropathy, and hx charcot foot due to DM.  Pt's furnace went out about 2 weeks ago.  She also has a wood burning stove, so she's been able to heat her house.  She hurt her right elbow last night picking up a pile of wood.  She is right handed.  She is concerned her elbow is out of place.         Prior to Admission medications  Medication Sig Start Date End Date Taking? Authorizing Provider  celecoxib  (CELEBREX ) 200 MG capsule Take 1 capsule (200 mg total) by mouth 2 (two) times daily. 03/27/24  Yes Dean Clarity, MD  HYDROcodone -acetaminophen  (NORCO/VICODIN) 5-325 MG tablet Take 1 tablet by mouth every 4 (four) hours as needed. 03/27/24  Yes Dean Clarity, MD  amLODipine  (NORVASC ) 5 MG tablet Take 1 tablet (5 mg total) by mouth daily. 12/09/23   Cook, [name], DO  atorvastatin  (LIPITOR) 20 MG tablet TAKE 1 TABLET BY MOUTH ONCE  DAILY 01/15/24   Cook, [name], DO  blood glucose meter kit and supplies Dispense based on patient and insurance preference. Use up to four times daily as directed. (FOR ICD-10 E10.9, E11.9). 03/21/21   Elnor Fairy HERO, NP  calcium -vitamin D  (OSCAL WITH D) 500-5 MG-MCG tablet Take 1 tablet by mouth.    [provider]  ferrous sulfate 325 (65 FE) MG tablet Take 325 mg by mouth daily with breakfast.    [provider]  glimepiride  (AMARYL ) 1 MG tablet TAKE 1 TABLET BY MOUTH DAILY  WITH BREAKFAST 03/09/24   Cook, [name], DO  glucose blood (ACCU-CHEK GUIDE) test strip Tests once a day 06/11/22   Cook, [name], DO  metroNIDAZOLE  (METROGEL ) 0.75 % gel Apply and rub a thin film twice daily, morning and evening, to entire affected areas after washing.  11/12/23   Cook, [name], DO  tamoxifen  (NOLVADEX ) 10 MG tablet Take 1 tablet (10 mg total) by mouth daily. 02/05/24   Geofm Delon BRAVO, NP  valsartan  (DIOVAN ) 80 MG tablet Take 1 tablet (80 mg total) by mouth daily. 05/26/23   Cook, [name], DO    Allergies: Clindamycin /lincomycin and Enalapril    Review of Systems  Musculoskeletal:        Right elbow pain  All other systems reviewed and are negative.   Updated Vital Signs BP (!) 160/96 (BP Location: Left Arm)   Pulse 92   Temp 97.7 F (36.5 C) (Oral)   Resp 16   Ht 5' 6 (1.676 m)   Wt 60.3 kg   SpO2 100%   BMI 21.47 kg/m   Physical Exam Vitals and nursing note reviewed.  Constitutional:      Appearance: Normal appearance.  HENT:     Head: Normocephalic and atraumatic.     Right Ear: External ear normal.     Left Ear: External ear normal.     Nose: Nose normal.     Mouth/Throat:     Mouth: Mucous membranes are moist.     Pharynx: Oropharynx is clear.  Eyes:     Extraocular Movements: Extraocular movements intact.     Conjunctiva/sclera: Conjunctivae normal.  Pupils: Pupils are equal, round, and reactive to light.  Cardiovascular:     Rate and Rhythm: Normal rate and regular rhythm.     Pulses: Normal pulses.     Heart sounds: Normal heart sounds.  Pulmonary:     Effort: Pulmonary effort is normal.     Breath sounds: Normal breath sounds.  Abdominal:     General: Abdomen is flat. Bowel sounds are normal.     Palpations: Abdomen is soft.  Musculoskeletal:     Right elbow: No deformity. Decreased range of motion. Tenderness present.  Skin:    General: Skin is warm.     Capillary Refill: Capillary refill takes less than 2 seconds.  Neurological:     General: No focal deficit present.     Mental Status: She is alert and oriented to person, place, and time.  Psychiatric:        Mood and Affect: Mood normal.        Behavior: Behavior normal.     (all labs ordered are listed, but only abnormal results are  displayed) Labs Reviewed - No data to display  EKG: None  Radiology: DG Elbow Complete Right Result Date: 03/27/2024 EXAM: 3 VIEW(S) XRAY OF THE ELBOW COMPARISON: None available. CLINICAL HISTORY: pain FINDINGS: BONES AND JOINTS: Small olecranon enthesophyte. Well corticated ossific fragment along the medial epicondyle, likely related to remote trauma. No joint effusion. No acute fracture. No malalignment. SOFT TISSUES: The soft tissues are unremarkable. IMPRESSION: 1. No acute fracture or dislocation. Electronically signed by: Rogelia Myers MD 03/27/2024 09:40 AM EST RP Workstation: [id number]     Procedures   Medications Ordered in the ED  dexamethasone  (DECADRON ) injection 10 mg (has no administration in time range)  ketorolac  (TORADOL ) 30 MG/ML injection 30 mg (has no administration in time range)    Clinical Course as of 03/27/24 0958  Sat Mar 27, 2024  0956 DG Elbow Complete Right [JH]    Clinical Course User Index [JH] Dean Clarity, MD                                 Medical Decision Making Amount and/or Complexity of Data Reviewed Radiology: ordered.  Risk Prescription drug management.   This patient presents to the ED for concern of right elbow pain, this involves an extensive number of treatment options, and is a complaint that carries with it a high risk of complications and morbidity.  The differential diagnosis includes fx, d/l, strain, ligamentous injury   Co morbidities that complicate the patient evaluation  DM, HTN, HLD, arthritis, kidney stones, diabetic neuropathy, and hx charcot foot due to DM   Additional history obtained:  Additional history obtained from epic chart review  Imaging Studies ordered:  I ordered imaging studies including r elbow  I independently visualized and interpreted imaging which showed No acute fracture or dislocation.  I agree with the radiologist interpretation   Medicines ordered and prescription drug  management:  I ordered medication including toradol /decadron   for sx  Reevaluation of the patient after these medicines showed that the patient improved I have reviewed the patients home medicines and have made adjustments as needed  Problem List / ED Course:  R elbow pain:  no evidence of dislocation or fx.  Pt likely sprained it from lifting heavy wood. She is given a dose of decadron  and toradol  in the ED.  She is diabetic, so I am not going  to send her home on steroids, but she is told the decadron  can increase her bs and to beware of this.  She is placed in a sling for comfort and told to f/u with ortho.   Reevaluation:  After the interventions noted above, I reevaluated the patient and found that they have :improved   Social Determinants of Health:  Lives alone   Dispostion:  After consideration of the diagnostic results and the patients response to treatment, I feel that the patent would benefit from discharge with outpatient f/u.       Final diagnoses:  Elbow strain, right, initial encounter    ED Discharge Orders          Ordered    celecoxib  (CELEBREX ) 200 MG capsule  2 times daily        03/27/24 0956    HYDROcodone -acetaminophen  (NORCO/VICODIN) 5-325 MG tablet  Every 4 hours PRN        03/27/24 0956               Milanna Kozlov, MD 03/27/24 [phone]  "

## 2024-03-27 NOTE — ED Triage Notes (Signed)
 Patient presents POV from home c/c possible R elbow out of place. Patient has been using her wood stove to heat her home and feels she may have injured her elbow moving logs. Patient is able to move her R fingers/hand, denies numbness/tingling/sensory changes.

## 2024-03-30 ENCOUNTER — Telehealth: Payer: Self-pay | Admitting: *Deleted

## 2024-03-30 NOTE — Telephone Encounter (Signed)
 Copied from CRM #[id number]. Topic: Clinical - Medication Question >> Mar 30, 2024 10:25 AM Lonell PEDLAR wrote: Reason for CRM: Patient was recently seen at the ED and noticed that  atorvastatin  (LIPITOR) 20 MG tablet is listed on her medication list and she does not have this medication. Patient would like to clarify if Dr. Bluford would like her to be taking this? Please call [phone] to best advise.

## 2024-04-05 NOTE — Telephone Encounter (Signed)
 Cook, [name], DO     03/30/24  2:20 PM Yes. Went to [employer] in October.

## 2024-04-07 ENCOUNTER — Other Ambulatory Visit: Payer: Self-pay

## 2024-04-07 MED ORDER — ATORVASTATIN CALCIUM 20 MG PO TABS: 20.0000 mg | ORAL_TABLET | Freq: Every day | ORAL | 2 refills | Status: AC

## 2024-04-07 NOTE — Telephone Encounter (Signed)
 Pt states she never received and would have the cholesterol med resent to [facility]

## 2024-04-20 ENCOUNTER — Encounter (HOSPITAL_BASED_OUTPATIENT_CLINIC_OR_DEPARTMENT_OTHER): Attending: Internal Medicine | Admitting: Internal Medicine

## 2024-04-20 DIAGNOSIS — E1142 Type 2 diabetes mellitus with diabetic polyneuropathy: Secondary | ICD-10-CM | POA: Diagnosis not present

## 2024-04-20 DIAGNOSIS — L84 Corns and callosities: Secondary | ICD-10-CM | POA: Diagnosis not present

## 2024-04-20 DIAGNOSIS — E11610 Type 2 diabetes mellitus with diabetic neuropathic arthropathy: Secondary | ICD-10-CM | POA: Insufficient documentation

## 2024-05-14 ENCOUNTER — Ambulatory Visit: Admitting: Family Medicine

## 2024-05-20 ENCOUNTER — Inpatient Hospital Stay

## 2024-05-27 ENCOUNTER — Inpatient Hospital Stay: Admitting: Oncology

## 2024-06-22 ENCOUNTER — Ambulatory Visit: Admitting: Family Medicine

## 2024-11-19 ENCOUNTER — Ambulatory Visit
